# Patient Record
Sex: Female | Born: 1935
Health system: Southern US, Community
[De-identification: ages and names within clinical notes are randomized; demographics above are authoritative.]

## PROBLEM LIST (undated history)

## (undated) DIAGNOSIS — M19071 Primary osteoarthritis, right ankle and foot: Secondary | ICD-10-CM

## (undated) DIAGNOSIS — M810 Age-related osteoporosis without current pathological fracture: Secondary | ICD-10-CM

## (undated) DIAGNOSIS — R2681 Unsteadiness on feet: Secondary | ICD-10-CM

## (undated) DIAGNOSIS — R002 Palpitations: Secondary | ICD-10-CM

## (undated) DIAGNOSIS — I10 Essential (primary) hypertension: Secondary | ICD-10-CM

## (undated) DIAGNOSIS — Z87440 Personal history of urinary (tract) infections: Secondary | ICD-10-CM

## (undated) HISTORY — DX: Personal history of urinary (tract) infections: Z87.440

## (undated) HISTORY — PX: OTHER SURGICAL HISTORY: SHX169

## (undated) HISTORY — DX: Palpitations: R00.2

## (undated) HISTORY — DX: Age-related osteoporosis without current pathological fracture: M81.0

## (undated) HISTORY — DX: Unsteadiness on feet: R26.81

## (undated) HISTORY — DX: Primary osteoarthritis, right ankle and foot: M19.071

---

## 2012-04-01 DIAGNOSIS — I10 Essential (primary) hypertension: Secondary | ICD-10-CM | POA: Diagnosis not present

## 2012-04-01 DIAGNOSIS — S058X9A Other injuries of unspecified eye and orbit, initial encounter: Secondary | ICD-10-CM | POA: Diagnosis not present

## 2012-04-02 DIAGNOSIS — S058X9A Other injuries of unspecified eye and orbit, initial encounter: Secondary | ICD-10-CM | POA: Diagnosis not present

## 2013-05-09 ENCOUNTER — Ambulatory Visit (INDEPENDENT_AMBULATORY_CARE_PROVIDER_SITE_OTHER): Payer: Medicare Other | Admitting: General Practice

## 2013-05-09 ENCOUNTER — Encounter: Payer: Self-pay | Admitting: General Practice

## 2013-05-09 ENCOUNTER — Ambulatory Visit (INDEPENDENT_AMBULATORY_CARE_PROVIDER_SITE_OTHER): Payer: Medicare Other

## 2013-05-09 VITALS — BP 144/65 | HR 75 | Temp 98.2°F | Ht 61.0 in | Wt 151.5 lb

## 2013-05-09 DIAGNOSIS — M19079 Primary osteoarthritis, unspecified ankle and foot: Secondary | ICD-10-CM | POA: Diagnosis not present

## 2013-05-09 DIAGNOSIS — M79609 Pain in unspecified limb: Secondary | ICD-10-CM

## 2013-05-09 DIAGNOSIS — M79671 Pain in right foot: Secondary | ICD-10-CM

## 2013-05-09 DIAGNOSIS — R2689 Other abnormalities of gait and mobility: Secondary | ICD-10-CM

## 2013-05-09 DIAGNOSIS — M19071 Primary osteoarthritis, right ankle and foot: Secondary | ICD-10-CM

## 2013-05-09 MED ORDER — HUGO ROLLING WALKER BASIC MISC: 1.0000 [IU] | Freq: Once | Status: DC

## 2013-05-09 MED ORDER — KETOROLAC TROMETHAMINE 30 MG/ML IJ SOLN
30.0000 mg | Freq: Once | INTRAMUSCULAR | Status: AC
  Administered 2013-05-09: 30 mg via INTRAMUSCULAR

## 2013-05-09 NOTE — Progress Notes (Signed)
   Subjective:    Patient ID: [name], female    DOB: 11-08-1932, 80 y.o.   MRN: [id number]  Foot Pain This is a chronic (Right foot pain) problem. Episode onset: 19 years fractured ankle, but worsened over past three years. The problem occurs constantly. The problem has been gradually worsening. Pertinent negatives include no arthralgias, change in bowel habit, chest pain, chills, fever, headaches, myalgias, neck pain or weakness. The symptoms are aggravated by walking. She has tried acetaminophen and NSAIDs (OTC analgesic creams) for the symptoms. The treatment provided mild relief.      Review of Systems  Constitutional: Negative for fever and chills.  Respiratory: Negative for chest tightness and shortness of breath.   Cardiovascular: Negative for chest pain and palpitations.  Gastrointestinal: Negative for change in bowel habit.  Musculoskeletal: Negative for arthralgias, myalgias and neck pain.       Right foot pain  Neurological: Negative for dizziness, weakness and headaches.       Objective:   Physical Exam  Constitutional: She is oriented to person, place, and time. She appears well-developed and well-nourished.  Cardiovascular: Normal rate, regular rhythm and normal heart sounds.   Pulmonary/Chest: Effort normal and breath sounds normal. No respiratory distress. She exhibits no tenderness.  Musculoskeletal: She exhibits tenderness.  Right foot pain with ambulation  Neurological: She is alert and oriented to person, place, and time.    [facility] reading (PRIMARY) by Erby Pian, FNP-C, right foot arthritis and bone deformity.       Assessment & Plan:  1. Right foot pain  - DG Foot Complete Right; Future - ketorolac (TORADOL) 30 MG/ML injection 30 mg; Inject 1 mL (30 mg total) into the muscle once. - Ambulatory referral to Orthopedic Surgery  2. Arthritis of right foot and 3. Impaired weight bearing  - Misc. Devices (Post Falls) MISC; 1 Units  by Does not apply route once.  Dispense: 1 each; Refill: 0 -Rest, ice, and elevate affected area -RTO if symptoms worsen and prn Patient and family member verbalized understanding Erby Pian, FNP-C

## 2013-05-09 NOTE — Patient Instructions (Signed)

## 2013-06-21 DIAGNOSIS — M779 Enthesopathy, unspecified: Secondary | ICD-10-CM | POA: Diagnosis not present

## 2013-06-21 DIAGNOSIS — M19079 Primary osteoarthritis, unspecified ankle and foot: Secondary | ICD-10-CM | POA: Diagnosis not present

## 2013-06-21 DIAGNOSIS — M25579 Pain in unspecified ankle and joints of unspecified foot: Secondary | ICD-10-CM | POA: Diagnosis not present

## 2013-06-21 DIAGNOSIS — M79609 Pain in unspecified limb: Secondary | ICD-10-CM | POA: Diagnosis not present

## 2013-07-06 ENCOUNTER — Telehealth: Payer: Self-pay | Admitting: General Practice

## 2013-07-11 ENCOUNTER — Other Ambulatory Visit: Payer: Self-pay | Admitting: General Practice

## 2013-07-11 DIAGNOSIS — R2689 Other abnormalities of gait and mobility: Secondary | ICD-10-CM

## 2013-07-11 DIAGNOSIS — M19071 Primary osteoarthritis, right ankle and foot: Secondary | ICD-10-CM

## 2013-07-11 DIAGNOSIS — M79671 Pain in right foot: Secondary | ICD-10-CM

## 2013-07-11 NOTE — Telephone Encounter (Signed)
Script ready for pickup, nurse called to inform.

## 2013-07-19 DIAGNOSIS — B351 Tinea unguium: Secondary | ICD-10-CM | POA: Diagnosis not present

## 2013-07-19 DIAGNOSIS — M79609 Pain in unspecified limb: Secondary | ICD-10-CM | POA: Diagnosis not present

## 2013-09-06 ENCOUNTER — Telehealth: Payer: Self-pay | Admitting: General Practice

## 2013-09-28 ENCOUNTER — Encounter: Payer: Self-pay | Admitting: Family

## 2013-09-28 ENCOUNTER — Ambulatory Visit (INDEPENDENT_AMBULATORY_CARE_PROVIDER_SITE_OTHER): Payer: Medicare Other | Admitting: Family

## 2013-09-28 VITALS — BP 155/75 | HR 90 | Temp 98.0°F | Ht 61.0 in | Wt 156.2 lb

## 2013-09-28 DIAGNOSIS — R2681 Unsteadiness on feet: Secondary | ICD-10-CM

## 2013-09-28 DIAGNOSIS — M79609 Pain in unspecified limb: Secondary | ICD-10-CM | POA: Diagnosis not present

## 2013-09-28 DIAGNOSIS — M79671 Pain in right foot: Secondary | ICD-10-CM

## 2013-09-28 DIAGNOSIS — R2689 Other abnormalities of gait and mobility: Secondary | ICD-10-CM

## 2013-09-28 DIAGNOSIS — R269 Unspecified abnormalities of gait and mobility: Secondary | ICD-10-CM

## 2013-09-28 DIAGNOSIS — M19071 Primary osteoarthritis, right ankle and foot: Secondary | ICD-10-CM

## 2013-09-28 DIAGNOSIS — M19079 Primary osteoarthritis, unspecified ankle and foot: Secondary | ICD-10-CM

## 2013-09-28 NOTE — Progress Notes (Signed)
   Subjective:    Patient ID: [name], female    DOB: 1932-12-13, 80 y.o.   MRN: [id number]  HPI Pt presents to office with complaints of unsteady gait. Pt states she is already using a walker daily safely with no complaints, however it is "old and falling apart". Pt states without her walker she can complete her daily activities. Pt has impaired gait that inhibits her ability to participate in daily actitvies of daily living such as meal prep, grocery shopping, cleaning, and walking around her home without her walker.  *Pt spanish speaking, interrupter used during visit.  Review of Systems  HENT: Negative.   Respiratory: Negative.   Cardiovascular: Negative.   Gastrointestinal: Negative.   Genitourinary: Negative.   Musculoskeletal: Positive for arthralgias and gait problem.  Neurological: Negative.   Psychiatric/Behavioral: Negative.   All other systems reviewed and are negative.      Objective:   Physical Exam  Vitals reviewed. Constitutional: She is oriented to person, place, and time. She appears well-developed and well-nourished. No distress.  Cardiovascular: Normal rate, regular rhythm, normal heart sounds and intact distal pulses.   No murmur heard. Pulmonary/Chest: Effort normal and breath sounds normal. No respiratory distress. She has no wheezes.  Abdominal: Soft. Bowel sounds are normal. She exhibits no distension. There is no tenderness.  Musculoskeletal: Normal range of motion. She exhibits tenderness (in bilateral ankles). She exhibits no edema.  Pt presents to office using a walker, unsteady gait without using walker.  Neurological: She is alert and oriented to person, place, and time. She has normal reflexes. No cranial nerve deficit.  Skin: Skin is warm and dry.  Psychiatric: She has a normal mood and affect. Her behavior is normal. Judgment and thought content normal.     BP 155/75  Pulse 90  Temp(Src) 98 F (36.7 C) (Oral)  Ht 5\' 1"  (1.549 m)  Wt  156 lb 3.2 oz (70.852 kg)  BMI 29.53 kg/m2      Assessment & Plan:  1. Unsteady gait - For home use only DME 4 wheeled rolling walker with seat ([id number]) -Fall precaution discussed -Pt educated on places to take RX to and told to call office if any problems  2. Right foot pain  3. Arthritis of right foot  4. Impaired weight bearing  Evelina Dun, FNP

## 2013-09-28 NOTE — Patient Instructions (Addendum)
Prevencin de las cadas y seguridad en Engineer, mining  (Fall Prevention and Animal nutritionist) Las cadas causan lesiones y Lawyer a personas de todas las edades. Es posible utilizar medidas preventivas para disminuir significativamente la probabilidad de cadas. Hay medidas simples que pueden hacer de su hogar un lugar seguro y Product/process development scientist las cadas.  [place] grietas y los bordes de aceras y Chartered certified accountant.  Retire los [employer].  Recorte los arbustos en el camino principal.  Coloque una buena iluminacin en el exterior.  Elimine las herramientas, piedras y escombros de los senderos.  Compruebe que los pasamanos no se rompan y estn bien sujetos. Debe haber pasamanos a ambos lados de las escaleras .  Limpie regularmente hojas, nieve y hielo.  Utilice arena o sal en los pasillos durante los meses de invierno.  En el garaje, limpiar la grasa o los derrames de combustible. EN EL BAO   Instale luces de noche.  Instale barras de apoyo en el inodoro, en la baera y en la ducha.  Utilice alfombras o calcomanas antideslizantes en la baera o ducha.  Coloque un taburete de plstico antideslizante en la ducha para sentarse, si es necesario.  Mantenga los pisos limpios y seque toda el agua del suelo inmediatamente.  Elimine regularmente la acumulacin de jabn en la baera o la ducha.  Asegure las alfombras de bao con una cinta para alfombra doble faz.  Retire las alfombras y todo lo que pueda ser un un riesgo. HABITACIONES   Instale luces de noche.  Asegrese de que la luz de la mesita sea de fcil [place].  No utilice ropa de cama de gran tamao.  Mantenga un telfono junto a su cama.  Tenga una silla firme con apoyabrazos para usar cuando se viste.  Retire las alfombras y lo que pueda ser un riesgo de cadas. [place] ollas y sartenes hacia el centro del horno. Use los quemadores de atrs siempre que sea posible.  Limpie los  derrames rpidamente y de tiempo para el secado.  Evite caminar sobre pisos mojados.  Evite utensilios y cuchillos calientes .  Cambie de posicin los estantes para que no sean demasiado altos o bajos.  Coloque los objetos de uso comn en lugares de fcil acceso.  Si es necesario, use una escalera firme con una barra de apoyo.  Mantenga los cables elctricos fuera del camino.  No use cera para pisos o limpiadores que dejen los pisos resbaladizos. Si tiene que usar cera, utilice cera antideslizante.  Retire del piso las alfombras y todo lo que pueda ser un un riesgo. ESCALERAS   Nunca deje objetos en las escaleras.  Coloque pasamanos a ambos lados de las escaleras y selos. Arregle los pasamanos sueltos. Asegrese que los pasamanos en ambos lados de las escaleras sean tan largos como las escaleras.  Verifique que la alfombra est bien asegurada a las escaleras. Repare rpidamente las alfombras desgastadas o sueltas .  Evite colocar alfombras en la parte superior o inferior de las escaleras, o asegure correctamente la alfombra con cinta adhesiva para alfombras para evitar el deslizamiento. Deshgase de alfombras, si es posible.  Pdale a un electricista que coloque un interruptor de la luz en la parte superior e inferior de las escaleras. OTROS CONSEJOS PARA LA PREVENCIN DE CADAS   Use zapatos de tacn bajo o con suela de goma que sostengan y [place]. Use zapatos cerrados.  Al utilizar una escalera de  mano, asegrese de que est completamente abierta y los dos travesaos firmemente bloqueados. No suba a una escalera de mano estando cerrada. .  Aada pintura de contraste o cinta de colores a las barras de apoyo y Buyer, retail en su casa. Coloque las tiras de contraste de Psychiatrist y el ltimo escaln.  Conozca y use los dispositivos de ayuda para la movilidad, segn sea necesario. Instalar un sistema de respuestas de Biomedical scientist.  Prenda las luces para evitar  las reas oscuras. Reponga inmediatamente las lamparillas elctricas que se hayan quemado. Coloque interruptores de luz luminiscentes.  Disponga los muebles de tal modo que pueda crear caminos despejados. Deje los muebles siempre en Therapist, sports.  Asegure firmemente las alfombras con cinta adhesiva de doble faz.  Elimine las superficies desparejas en los pisos.  Seleccione un diseo de alfombra que visualmente no oculte los bordes de las alfombras.  Tenga cuidado con las [employer]. OTROS CONSEJOS DE SEGURIDAD PARA EL HOGAR   Seleccione una temperatura de 120 F (48,8 C) para el agua.  Tenga los nmeros de emergencia cerca del telfono.  Coloque detectores de humo en cada nivel de su casa y cerca de los dormitorios. Document Released: 07/15/2007 Document Revised: 10/07/2011 [facility] Patient Information 2014 [place], [place].

## 2013-10-06 ENCOUNTER — Telehealth: Payer: Self-pay | Admitting: Family

## 2013-10-11 DIAGNOSIS — M79609 Pain in unspecified limb: Secondary | ICD-10-CM | POA: Diagnosis not present

## 2013-10-11 DIAGNOSIS — M25579 Pain in unspecified ankle and joints of unspecified foot: Secondary | ICD-10-CM | POA: Diagnosis not present

## 2013-10-11 DIAGNOSIS — M7750 Other enthesopathy of unspecified foot: Secondary | ICD-10-CM | POA: Diagnosis not present

## 2013-10-12 NOTE — Telephone Encounter (Signed)
Faxed noted to advanced

## 2014-05-30 DIAGNOSIS — M76811 Anterior tibial syndrome, right leg: Secondary | ICD-10-CM | POA: Diagnosis not present

## 2014-05-30 DIAGNOSIS — M76812 Anterior tibial syndrome, left leg: Secondary | ICD-10-CM | POA: Diagnosis not present

## 2014-08-29 DIAGNOSIS — M76891 Other specified enthesopathies of right lower limb, excluding foot: Secondary | ICD-10-CM | POA: Diagnosis not present

## 2014-08-29 DIAGNOSIS — M76892 Other specified enthesopathies of left lower limb, excluding foot: Secondary | ICD-10-CM | POA: Diagnosis not present

## 2014-08-29 DIAGNOSIS — M79673 Pain in unspecified foot: Secondary | ICD-10-CM | POA: Diagnosis not present

## 2014-10-05 DIAGNOSIS — M791 Myalgia: Secondary | ICD-10-CM | POA: Diagnosis not present

## 2014-11-23 ENCOUNTER — Encounter: Payer: Self-pay | Admitting: Physician Assistant

## 2014-11-23 ENCOUNTER — Ambulatory Visit (INDEPENDENT_AMBULATORY_CARE_PROVIDER_SITE_OTHER): Payer: Medicare Other | Admitting: Physician Assistant

## 2014-11-23 VITALS — BP 176/84 | HR 103 | Temp 97.9°F | Ht 61.0 in | Wt 154.4 lb

## 2014-11-23 DIAGNOSIS — N3090 Cystitis, unspecified without hematuria: Secondary | ICD-10-CM

## 2014-11-23 DIAGNOSIS — R002 Palpitations: Secondary | ICD-10-CM | POA: Diagnosis not present

## 2014-11-23 DIAGNOSIS — R350 Frequency of micturition: Secondary | ICD-10-CM

## 2014-11-23 DIAGNOSIS — R42 Dizziness and giddiness: Secondary | ICD-10-CM

## 2014-11-23 DIAGNOSIS — K140 Glossitis: Secondary | ICD-10-CM

## 2014-11-23 DIAGNOSIS — R5383 Other fatigue: Secondary | ICD-10-CM | POA: Diagnosis not present

## 2014-11-23 LAB — POCT CBC
Granulocyte percent: 72.8 %G (ref 37–80)
HEMATOCRIT: 41.6 % (ref 37.7–47.9)
Hemoglobin: 13.1 g/dL (ref 12.2–16.2)
LYMPH, POC: 2 (ref 0.6–3.4)
MCH, POC: 27.5 pg (ref 27–31.2)
MCHC: 31.5 g/dL — AB (ref 31.8–35.4)
MCV: 87.5 fL (ref 80–97)
MPV: 7.2 fL (ref 0–99.8)
POC Granulocyte: 7.1 — AB (ref 2–6.9)
POC LYMPH PERCENT: 20.6 %L (ref 10–50)
Platelet Count, POC: 321 10*3/uL (ref 142–424)
RBC: 4.75 M/uL (ref 4.04–5.48)
RDW, POC: 13.5 %
WBC: 9.7 10*3/uL (ref 4.6–10.2)

## 2014-11-23 LAB — POCT UA - MICROSCOPIC ONLY
BACTERIA, U MICROSCOPIC: NEGATIVE
CRYSTALS, UR, HPF, POC: NEGATIVE
Casts, Ur, LPF, POC: NEGATIVE
MUCUS UA: NEGATIVE
YEAST UA: NEGATIVE

## 2014-11-23 LAB — POCT URINALYSIS DIPSTICK
BILIRUBIN UA: NEGATIVE
Glucose, UA: NEGATIVE
KETONES UA: NEGATIVE
NITRITE UA: NEGATIVE
PH UA: 7.5
Protein, UA: NEGATIVE
SPEC GRAV UA: 1.015
UROBILINOGEN UA: NEGATIVE

## 2014-11-23 MED ORDER — CIPROFLOXACIN HCL 500 MG PO TABS: 500.0000 mg | ORAL_TABLET | Freq: Two times a day (BID) | ORAL | Status: DC

## 2014-11-23 NOTE — Patient Instructions (Signed)
Infección urinaria  °(Urinary Tract Infection) ° La infección urinaria puede ocurrir en cualquier lugar del tracto urinario. El tracto urinario es un sistema de drenaje del cuerpo por el que se eliminan los desechos y el exceso de agua. El tracto urinario está formado por dos riñones, dos uréteres, la vejiga y la uretra. Los riñones son órganos que tienen forma de frijol. Cada riñón tiene aproximadamente el tamaño del puño. Están situados debajo de las costillas, uno a cada lado de la columna vertebral °CAUSAS  °La causa de la infección son los microbios, que son organismos microscópicos, que incluyen hongos, virus, y bacterias. Estos organismos son tan pequeños que sólo pueden verse a través del microscopio. Las bacterias son los microorganismos que más comúnmente causan infecciones urinarias.  °SÍNTOMAS  °Los síntomas pueden variar según la edad y el sexo del paciente y por la ubicación de la infección. Los síntomas en las mujeres jóvenes incluyen la necesidad frecuente e intensa de orinar y una sensación dolorosa de ardor en la vejiga o en la uretra durante la micción. Las mujeres y los hombres mayores podrán sentir cansancio, temblores y debilidad y sentir dolores musculares y dolor abdominal. Si tiene fiebre, puede significar que la infección está en los riñones. Otros síntomas son dolor en la espalda o en los lados debajo de las costillas, náuseas y vómitos.  °DIAGNÓSTICO  °Para diagnosticar una infección urinaria, el médico le preguntará acerca de sus síntomas. También le solicitará una muestra de orina. La muestra de orina se analiza para detectar bacterias y glóbulos blancos de la sangre. Los glóbulos blancos se forman en el organismo para ayudar a combatir las infecciones.  °TRATAMIENTO  °Por lo general, las infecciones urinarias pueden tratarse con medicamentos. Debido a que la mayoría de las infecciones son causadas por bacterias, por lo general pueden tratarse con antibióticos. La elección del  antibiótico y la duración del tratamiento dependerá de sus síntomas y el tipo de bacteria causante de la infección.  °INSTRUCCIONES PARA EL CUIDADO EN EL HOGAR  °· Si le recetaron antibióticos, tómelos exactamente como su médico le indique. Termine el medicamento aunque se sienta mejor después de haber tomado sólo algunos. °· Beba gran cantidad de líquido para mantener la orina de tono claro o color amarillo pálido. °· Evite la cafeína, el té y las bebidas gaseosas. Estas sustancias irritan la vejiga. °· Vaciar la vejiga con frecuencia. Evite retener la orina durante largos períodos. °· Vacíe la vejiga antes y después de tener relaciones sexuales. °· Después de mover el intestino, las mujeres deben higienizarse la región perineal desde adelante hacia atrás. Use sólo un papel tissue por vez. °SOLICITE ATENCIÓN MÉDICA SI:  °· Siente dolor en la espalda. °· Le sube la fiebre. °· Los síntomas no mejoran luego de 3 días. °SOLICITE ATENCIÓN MÉDICA DE INMEDIATO SI:  °· Siente dolor intenso en la espalda o en la zona inferior del abdomen. °· Comienza a sentir escalofríos. °· Tiene náuseas o vómitos. °· Tiene una sensación continua de quemazón o molestias al orinar. °ASEGÚRESE DE QUE:  °· Comprende estas instrucciones. °· Controlará su enfermedad. °· Solicitará ayuda de inmediato si no mejora o empeora. °Document Released: 01/15/2005 Document Revised: 12/31/2011 °ExitCare® Patient Information ©2015 [employer]. This information is not intended to replace advice given to you by your health care provider. Make sure you discuss any questions you have with your health care provider. ° °

## 2014-11-23 NOTE — Progress Notes (Signed)
   Subjective:    Patient ID: [name], female    DOB: Jun 25, 1932, 81 y.o.   MRN: [id number]  HPI 81 y/o hispanic female presents with c/o facial tongue and throat numbness. Left hand was cold, sometimes her right hand is cold also. Episode started 3 days ago. Has been intermittent. Todays episode lasted longer. She complains that her tongue feels "heavy". No similar episodes in the past.   She has been taking ibuprofen otc for arthritis in her ankle. Does not take it on a regular basis.   She is not fluent in [place] and is accompanied by her son, who translates for her.     Review of Systems  Constitutional: Positive for chills.  HENT: Positive for voice change (hoarseness ). Negative for trouble swallowing.   Eyes: Negative.        Has difficulty blinking at times   Respiratory: Negative for shortness of breath.   Cardiovascular: Positive for palpitations (x 6 days, intermittent).  Gastrointestinal: Negative for nausea, vomiting, diarrhea, constipation and blood in stool.  Endocrine: Positive for polyuria.  Genitourinary: Positive for frequency. Negative for dysuria.  Musculoskeletal: Negative.   Skin: Negative.   Neurological: Positive for dizziness (when she is "moving around", does not happen with rest ), speech difficulty (feels that she cant move her tongue like she normally does ), weakness, light-headedness, numbness ( started on left side facial numbness, right sided facial numbness today ) and headaches (left temple intermittent, no pain today ). Negative for syncope.  Psychiatric/Behavioral: Negative for confusion. The patient is nervous/anxious.        Objective:   Physical Exam  Constitutional: She is oriented to person, place, and time. She appears well-developed and well-nourished. No distress.  Presents in wheelchair  Not fluent in [place]:  Mouth/Throat: No oropharyngeal exudate.  Tongue appears to be enlarged, difficult to examine posterior pharynx  - with tongue depressor   Cardiovascular:  EKG indicated occasional PVC's    Musculoskeletal: Normal range of motion.  Neurological: She is alert and oriented to person, place, and time.  Skin: She is not diaphoretic.  Psychiatric: She has a normal mood and affect. Her behavior is normal. Judgment and thought content normal.  Vitals reviewed.         Assessment & Plan:  1. Urinary frequency  - POCT UA - Microscopic Only - POCT urinalysis dipstick - Urine culture - ciprofloxacin (CIPRO) 500 MG tablet; Take 1 tablet (500 mg total) by mouth 2 (two) times daily.  Dispense: 20 tablet; Refill: 0  2. Lightheaded  - POCT CBC - CMP14+EGFR - EKG 12-Lead - Ambulatory referral to Cardiology  3. Palpitations  - EKG 12-Lead - Ambulatory referral to Cardiology  4. Cystitis  - ciprofloxacin (CIPRO) 500 MG tablet; Take 1 tablet (500 mg total) by mouth 2 (two) times daily.  Dispense: 20 tablet; Refill: 0  5. Glossitis  - Vitamin B12   RTO 2 weeks for recheck and to discuss labs   Jakorian Marengo A. Benjamin Stain PA-C

## 2014-11-24 LAB — CMP14+EGFR
A/G RATIO: 1.4 (ref 1.1–2.5)
ALK PHOS: 87 IU/L (ref 39–117)
ALT: 13 IU/L (ref 0–32)
AST: 18 IU/L (ref 0–40)
Albumin: 4.1 g/dL (ref 3.5–4.8)
BUN/Creatinine Ratio: 18 (ref 11–26)
BUN: 11 mg/dL (ref 8–27)
Bilirubin Total: 0.6 mg/dL (ref 0.0–1.2)
CALCIUM: 9.2 mg/dL (ref 8.7–10.3)
CHLORIDE: 98 mmol/L (ref 97–108)
CO2: 25 mmol/L (ref 18–29)
Creatinine, Ser: 0.6 mg/dL (ref 0.57–1.00)
GFR calc Af Amer: 100 mL/min/{1.73_m2} (ref >59)
GFR calc non Af Amer: 87 mL/min/{1.73_m2} (ref >59)
GLOBULIN, TOTAL: 3 g/dL (ref 1.5–4.5)
Glucose: 91 mg/dL (ref 65–99)
Potassium: 4.1 mmol/L (ref 3.5–5.2)
Sodium: 138 mmol/L (ref 134–144)
Total Protein: 7.1 g/dL (ref 6.0–8.5)

## 2014-11-24 LAB — VITAMIN B12: VITAMIN B 12: 390 pg/mL (ref 211–946)

## 2014-11-25 LAB — URINE CULTURE

## 2014-12-08 ENCOUNTER — Ambulatory Visit (INDEPENDENT_AMBULATORY_CARE_PROVIDER_SITE_OTHER): Payer: Medicare Other | Admitting: Family

## 2014-12-08 ENCOUNTER — Encounter: Payer: Self-pay | Admitting: Family

## 2014-12-08 VITALS — BP 166/73 | HR 71 | Temp 97.4°F | Ht 61.0 in | Wt 152.0 lb

## 2014-12-08 DIAGNOSIS — Z87440 Personal history of urinary (tract) infections: Secondary | ICD-10-CM | POA: Diagnosis not present

## 2014-12-08 DIAGNOSIS — J309 Allergic rhinitis, unspecified: Secondary | ICD-10-CM

## 2014-12-08 LAB — POCT UA - MICROSCOPIC ONLY
BACTERIA, U MICROSCOPIC: NEGATIVE
CRYSTALS, UR, HPF, POC: NEGATIVE
RBC, urine, microscopic: NEGATIVE
Yeast, UA: NEGATIVE

## 2014-12-08 LAB — POCT URINALYSIS DIPSTICK
Bilirubin, UA: NEGATIVE
Glucose, UA: NEGATIVE
Ketones, UA: NEGATIVE
LEUKOCYTES UA: NEGATIVE
NITRITE UA: NEGATIVE
Protein, UA: NEGATIVE
Spec Grav, UA: 1.02
UROBILINOGEN UA: NEGATIVE
pH, UA: 5

## 2014-12-08 MED ORDER — CETIRIZINE HCL 10 MG PO TABS: 10.0000 mg | ORAL_TABLET | Freq: Every day | ORAL | Status: DC

## 2014-12-08 NOTE — Patient Instructions (Addendum)
Rinitis alrgica (Allergic Rhinitis) La rinitis alrgica ocurre cuando las membranas mucosas de la nariz responden a los alrgenos. Los alrgenos son las partculas que estn en el aire y que hacen que el cuerpo tenga una reaccin IT consultant. Esto hace que usted libere anticuerpos alrgicos. A travs de una cadena de eventos, estos finalmente hacen que usted libere histamina en la corriente sangunea. Aunque la funcin de la histamina es proteger al organismo, es esta liberacin de histamina lo que provoca malestar, como los estornudos frecuentes, la congestin y goteo y Engineer, petroleum.  CAUSAS  La causa de la rinitis Regulatory affairs officer (fiebre del heno) son los alrgenos del polen que pueden provenir del csped, los rboles y Human resources officer. La causa de la rinitis Stage manager (rinitis alrgica perenne) son los alrgenos como los caros del polvo domstico, la caspa de las mascotas y las esporas del moho.  SNTOMAS   Secrecin nasal (congestin).  Goteo y picazn nasales con estornudos y Industrial/product designer. DIAGNSTICO  Su mdico puede ayudarlo a Actor alrgeno o los alrgenos que desencadenan sus sntomas. Si usted y su mdico no pueden Teacher, adult education cul es el alrgeno, pueden hacerse anlisis de sangre o estudios de la piel. TRATAMIENTO  La rinitis alrgica no tiene [place], pero puede controlarse mediante lo siguiente:  Medicamentos y vacunas contra la alergia (inmunoterapia).  Prevencin del alrgeno. La fiebre del heno a menudo puede tratarse con antihistamnicos en las formas de pldoras o aerosol nasal. Los antihistamnicos bloquean los efectos de la histamina. Existen medicamentos de venta libre que pueden ayudar con la congestin nasal y la hinchazn alrededor de los ojos. Consulte a su mdico antes de tomar o administrarse este medicamento.  Si la prevencin del alrgeno o el medicamento recetado no dan resultado, existen muchos medicamentos nuevos que su mdico puede recetarle. Pueden  usarse medicamentos ms fuertes si las medidas iniciales no son efectivas. Pueden aplicarse inyecciones desensibilizantes si los medicamentos y la prevencin no funcionan. La desensibilizacin ocurre cuando un paciente recibe vacunas constantes hasta que el cuerpo se vuelve menos sensible al alrgeno. Asegrese de Chartered certified accountant seguimiento con su mdico si los problemas continan. INSTRUCCIONES PARA EL CUIDADO EN EL HOGAR No es posible evitar por completo los alrgenos, pero puede reducir los sntomas al tomar medidas para limitar su exposicin a ellos. Es muy til saber exactamente a qu es alrgico para que pueda evitar sus desencadenantes especficos. SOLICITE ATENCIN MDICA SI:   [name].  Desarrolla una tos que no se detiene fcilmente (persistente).  Le falta el aire.  Comienza a tener sibilancias.  Los sntomas interfieren con las actividades diarias normales. Document Released: 01/15/2005 Document Revised: 01/26/2013 [facility] Patient Information 2015 [place]. This information is not intended to replace advice given to you by your health care provider. Make sure you discuss any questions you have with your health care provider.

## 2014-12-08 NOTE — Progress Notes (Signed)
   Subjective:    Patient ID: [name], female    DOB: [birth year], 81 y.o.   MRN: [id number]   HPI Pt presents to the office today to follow up on a UTI and dizziness. Pt reports the dysuria has improved greatly after completing her antibiotic.  Today pt is complaining of a "dryness" and "raspey voice". Pt denies any SOB, cough, fever, or chest pains.   Review of Systems  Constitutional: Negative.   HENT: Negative.   Eyes: Negative.   Respiratory: Negative.  Negative for shortness of breath.   Cardiovascular: Negative.  Negative for palpitations.  Gastrointestinal: Negative.   Endocrine: Negative.   Genitourinary: Negative.   Musculoskeletal: Negative.   Neurological: Negative.  Negative for headaches.  Hematological: Negative.   Psychiatric/Behavioral: Negative.   All other systems reviewed and are negative.      Objective:   Physical Exam  Constitutional: She is oriented to person, place, and time. She appears well-developed and well-nourished. No distress.  HENT:  Head: Normocephalic and atraumatic.  Right Ear: External ear normal.  Left Ear: External ear normal.  Oropharynx slightly erythemas Nasal passage mildly erythemas with mild swelling    Eyes: Pupils are equal, round, and reactive to light.  Neck: Normal range of motion. Neck supple. No thyromegaly present.  Cardiovascular: Normal rate, regular rhythm, normal heart sounds and intact distal pulses.   No murmur heard. Pulmonary/Chest: Effort normal and breath sounds normal. No respiratory distress. She has no wheezes.  Abdominal: Soft. Bowel sounds are normal. She exhibits no distension. There is no tenderness.  Musculoskeletal: Normal range of motion. She exhibits no edema or tenderness.  Neurological: She is alert and oriented to person, place, and time. She has normal reflexes. No cranial nerve deficit.  Skin: Skin is warm and dry.  Psychiatric: She has a normal mood and affect. Her behavior is normal.  Judgment and thought content normal.  Vitals reviewed.   BP 166/73 mmHg  Pulse 71  Temp(Src) 97.4 F (36.3 C) (Oral)  Ht 5\' 1"  (1.549 m)  Wt 152 lb (68.947 kg)  BMI 28.74 kg/m2       Assessment & Plan:  1. History of UTI - POCT urinalysis dipstick - POCT UA - Microscopic Only  2. Allergic rhinitis, unspecified allergic rhinitis type -Avoid allergens when possible -Encourage deep breathing and coughing as needed -RTO as needed and pt needs chronic follow up for blood work - cetirizine (ZYRTEC) 10 MG tablet; Take 1 tablet (10 mg total) by mouth daily.  Dispense: 30 tablet; Refill: Desha, FNP

## 2014-12-22 ENCOUNTER — Ambulatory Visit (INDEPENDENT_AMBULATORY_CARE_PROVIDER_SITE_OTHER): Payer: Medicare Other | Admitting: Family Medicine

## 2014-12-22 ENCOUNTER — Encounter: Payer: Self-pay | Admitting: Family Medicine

## 2014-12-22 VITALS — BP 158/76 | HR 75 | Temp 97.3°F | Ht 61.0 in | Wt 155.0 lb

## 2014-12-22 DIAGNOSIS — M25561 Pain in right knee: Secondary | ICD-10-CM

## 2014-12-22 DIAGNOSIS — J040 Acute laryngitis: Secondary | ICD-10-CM | POA: Diagnosis not present

## 2014-12-22 MED ORDER — PREDNISONE 10 MG PO TABS: ORAL_TABLET | ORAL | Status: DC

## 2014-12-22 NOTE — Progress Notes (Signed)
Subjective:  Patient ID: [name], female    DOB: May 04, 1932  Age: 81 y.o. MRN: [id number]  CC: Hoarse   HPI [name] presents for dry, sore throat. Hoarse. Onset 1 month ago.  Took zyrtec for 1 week. Didn't help. No cough. Lungs feel tired. Feels a wheezing in the night in the throat. Nose is dry.   Right leg wound 4 yrs ago. Now painful. Pain is primarily located at the level of the right knee. It has been painful off and on ever since the injury. Usually controlled with Voltaren gel and ibuprofen but not successfully recently. Pain seems to have flared over the last few weeks. No change in activity. No known reinjury.   [name] has no past medical history on file.   She has no past surgical history on file.   Her family history is not on file.She reports that she has never smoked. She does not have any smokeless tobacco history on file. She reports that she does not drink alcohol or use illicit drugs.  Outpatient Prescriptions Prior to Visit  Medication Sig Dispense Refill  . Ibuprofen (ADVIL) 200 MG CAPS Take by mouth.    . VOLTAREN 1 % GEL     . cetirizine (ZYRTEC) 10 MG tablet Take 1 tablet (10 mg total) by mouth daily. (Patient not taking: Reported on 12/22/2014) 30 tablet 11   No facility-administered medications prior to visit.    ROS Review of Systems  Constitutional: Negative for fever, chills, diaphoresis, appetite change, fatigue and unexpected weight change.  HENT: Positive for congestion, postnasal drip and sore throat. Negative for ear pain, rhinorrhea, sneezing and trouble swallowing.   Eyes: Negative for pain.  Respiratory: Negative for cough, chest tightness and shortness of breath.   Cardiovascular: Negative for chest pain and palpitations.  Gastrointestinal: Negative for nausea, vomiting, abdominal pain, diarrhea and constipation.  Musculoskeletal: Positive for arthralgias (see history of present illness). Negative for joint swelling.  Skin: Negative  for rash.  Neurological: Negative for dizziness, weakness, numbness and headaches.  Psychiatric/Behavioral: Negative.     Objective:  BP 158/76 mmHg  Pulse 75  Temp(Src) 97.3 F (36.3 C) (Oral)  Ht 5\' 1"  (1.549 m)  Wt 155 lb (70.308 kg)  BMI 29.30 kg/m2  BP Readings from Last 3 Encounters:  12/22/14 158/76  12/08/14 166/73  11/23/14 176/84    Wt Readings from Last 3 Encounters:  12/22/14 155 lb (70.308 kg)  12/08/14 152 lb (68.947 kg)  11/23/14 154 lb 6.4 oz (70.035 kg)     Physical Exam  Constitutional: She is oriented to person, place, and time. She appears well-developed and well-nourished. No distress.  HENT:  Head: Normocephalic and atraumatic.  Right Ear: External ear normal.  Left Ear: External ear normal.  Nose: Nose normal.  Mouth/Throat: Oropharynx is clear and moist.  Eyes: Conjunctivae and EOM are normal. Pupils are equal, round, and reactive to light.  Neck: Normal range of motion. Neck supple. No JVD present. No tracheal deviation present. No thyromegaly present.  Cardiovascular: Normal rate, regular rhythm and normal heart sounds.   No murmur heard. Pulmonary/Chest: Effort normal and breath sounds normal. No stridor. No respiratory distress. She has no wheezes. She has no rales.  Abdominal: Soft. Bowel sounds are normal. She exhibits no distension. There is no tenderness.  Lymphadenopathy:    She has no cervical adenopathy.  Neurological: She is alert and oriented to person, place, and time. She has normal reflexes.  Skin: Skin is warm  and dry.  Psychiatric: She has a normal mood and affect. Her behavior is normal. Judgment and thought content normal.    No results found for: HGBA1C  Lab Results  Component Value Date   WBC 9.7 11/23/2014   HGB 13.1 11/23/2014   HCT 41.6 11/23/2014   GLUCOSE 91 11/23/2014   ALT 13 11/23/2014   AST 18 11/23/2014   NA 138 11/23/2014   K 4.1 11/23/2014   CL 98 11/23/2014   CREATININE 0.60 11/23/2014   BUN 11  11/23/2014   CO2 25 11/23/2014    Patient was never admitted.  Assessment & Plan:   [name] was seen today for hoarse.  Diagnoses and all orders for this visit:  Laryngitis  Pain in joint, lower leg, right  Other orders -     predniSONE (DELTASONE) 10 MG tablet; Take 5 daily for 3 days followed by 4,3,2 and 1 for 3 days each.   I have discontinued [name]'s cetirizine. I am also having her start on predniSONE. Additionally, I am having her maintain her Ibuprofen and VOLTAREN.  Meds ordered this encounter  Medications  . predniSONE (DELTASONE) 10 MG tablet    Sig: Take 5 daily for 3 days followed by 4,3,2 and 1 for 3 days each.    Dispense:  45 tablet    Refill:  0     Follow-up: Return in about 2 weeks (around 01/05/2015).  Claretta Fraise, M.D.

## 2014-12-22 NOTE — Patient Instructions (Signed)
Calcium 1200 mg daily Vitamin D 2000 units daily

## 2014-12-26 DIAGNOSIS — R0982 Postnasal drip: Secondary | ICD-10-CM | POA: Insufficient documentation

## 2014-12-26 DIAGNOSIS — M79671 Pain in right foot: Secondary | ICD-10-CM | POA: Insufficient documentation

## 2014-12-26 DIAGNOSIS — J040 Acute laryngitis: Secondary | ICD-10-CM | POA: Insufficient documentation

## 2014-12-26 DIAGNOSIS — M19071 Primary osteoarthritis, right ankle and foot: Secondary | ICD-10-CM | POA: Insufficient documentation

## 2014-12-26 DIAGNOSIS — R2681 Unsteadiness on feet: Secondary | ICD-10-CM | POA: Insufficient documentation

## 2014-12-26 DIAGNOSIS — R0981 Nasal congestion: Secondary | ICD-10-CM | POA: Insufficient documentation

## 2014-12-26 DIAGNOSIS — Z87440 Personal history of urinary (tract) infections: Secondary | ICD-10-CM | POA: Insufficient documentation

## 2014-12-26 DIAGNOSIS — J309 Allergic rhinitis, unspecified: Secondary | ICD-10-CM | POA: Insufficient documentation

## 2014-12-26 DIAGNOSIS — J029 Acute pharyngitis, unspecified: Secondary | ICD-10-CM | POA: Insufficient documentation

## 2014-12-26 DIAGNOSIS — R2689 Other abnormalities of gait and mobility: Secondary | ICD-10-CM | POA: Insufficient documentation

## 2014-12-27 ENCOUNTER — Other Ambulatory Visit (INDEPENDENT_AMBULATORY_CARE_PROVIDER_SITE_OTHER): Payer: Medicare Other

## 2014-12-27 ENCOUNTER — Ambulatory Visit (INDEPENDENT_AMBULATORY_CARE_PROVIDER_SITE_OTHER): Payer: Medicare Other | Admitting: Cardiology

## 2014-12-27 ENCOUNTER — Other Ambulatory Visit: Payer: Self-pay | Admitting: Cardiology

## 2014-12-27 ENCOUNTER — Encounter: Payer: Self-pay | Admitting: Cardiology

## 2014-12-27 VITALS — BP 180/88 | HR 62 | Ht 61.0 in | Wt 155.0 lb

## 2014-12-27 DIAGNOSIS — R002 Palpitations: Secondary | ICD-10-CM

## 2014-12-27 DIAGNOSIS — R0602 Shortness of breath: Secondary | ICD-10-CM

## 2014-12-27 NOTE — Progress Notes (Signed)
Cardiology Office Note   Date:  12/27/2014   ID:  [name], DOB 01/11/1933, MRN [id number]  PCP:  Sharion Balloon, FNP  Cardiologist:   Minus Breeding, MD   No chief complaint on file.     History of Present Illness: [name] is a 81 y.o. female who presents for New patient evaluation. She has no prior cardiac history. She presents for evaluation of palpitations. She's been noticing this for several weeks or months. She will notice her heart skipping. She does get lightheaded and actually has had to eaze herself down to the bed at times when this happens. It does not sound like she lost consciousness. It seems to be getting more intense with time. She says she can bring this on when she tries to do household activities though she is very limited by arthritis in her foot. She does not describe any chest pressure, neck or arm discomfort. She does however get short of breath with mild exertion. She also occasionally get short of breath when lying down at night. She's not describing classic PND or orthopnea however.   Past Medical History  Diagnosis Date  . Unsteady gait   . Arthritis of right foot   . Palpitations   . Hx: UTI (urinary tract infection)     Past Surgical History  Procedure Laterality Date  . None       Current Outpatient Prescriptions  Medication Sig Dispense Refill  . Ibuprofen (ADVIL) 200 MG CAPS Take by mouth as needed.     . predniSONE (DELTASONE) 10 MG tablet Take 5 daily for 3 days followed by 4,3,2 and 1 for 3 days each. 45 tablet 0  . VOLTAREN 1 % GEL as needed.      No current facility-administered medications for this visit.    Allergies:   Review of patient's allergies indicates no known allergies.    Social History:  The patient  reports that she has never smoked. She does not have any smokeless tobacco history on file. She reports that she does not drink alcohol or use illicit drugs.   Family History:  The patient's family history  includes Heart attack (age of onset: 49) in her mother.    ROS:  Please see the history of present illness.   Otherwise, review of systems are positive for none.   All other systems are reviewed and negative.    PHYSICAL EXAM: VS:  BP 180/88 mmHg  Pulse 62  Ht 5\' 1"  (1.549 m)  Wt 155 lb (70.308 kg)  BMI 29.30 kg/m2  SpO2 94% , BMI Body mass index is 29.3 kg/(m^2). GENERAL:  Well appearing HEENT:  Pupils equal round and reactive, fundi not visualized, oral mucosa unremarkable NECK:  No jugular venous distention, waveform within normal limits, carotid upstroke brisk and symmetric, no bruits, no thyromegaly LYMPHATICS:  No cervical, inguinal adenopathy LUNGS:  Clear to auscultation bilaterally BACK:  No CVA tenderness CHEST:  Unremarkable HEART:  PMI not displaced or sustained,S1 and S2 within normal limits, no S3, no S4, no clicks, no rubs, no murmurs ABD:  Flat, positive bowel sounds normal in frequency in pitch, no bruits, no rebound, no guarding, no midline pulsatile mass, no hepatomegaly, no splenomegaly EXT:  2 plus pulses throughout, no edema, no cyanosis no clubbing SKIN:  No rashes no nodules NEURO:  Cranial nerves II through XII grossly intact, motor grossly intact throughout PSYCH:  Cognitively intact, oriented to person place and time    EKG:  EKG is not ordered today. The ekg ordered 8/4 demonstrates normal sinus rhythm, axis within normal limits, intervals within normal limits, no acute ST-T wave changes.   Recent Labs: 11/23/2014: ALT 13; BUN 11; Creatinine, Ser 0.60; Hemoglobin 13.1; Potassium 4.1; Sodium 138    Lipid Panel No results found for: CHOL, TRIG, HDL, CHOLHDL, VLDL, LDLCALC, LDLDIRECT    Wt Readings from Last 3 Encounters:  12/27/14 155 lb (70.308 kg)  12/22/14 155 lb (70.308 kg)  12/08/14 152 lb (68.947 kg)      Other studies Reviewed: Additional studies/ records that were reviewed today include: EKG. Review of the above records demonstrates:   Please see elsewhere in the note.     ASSESSMENT AND PLAN:  PALPITATIONS:  The patient describes palpitations as above. I will start with an event monitor.  Shaquitta Burbridge will need a 21 day event monitor.  The patients symptoms necessitate an event monitor.  The symptoms are too infrequent to be identified on a Holter monitor.    I will check a TSH.    DYSPNEA:  I will check an echo given this and the palpitations.    HTN:  Her blood pressure is elevated.  I will further manage this in the context of treating her palpitations after I have the data above.   Current medicines are reviewed at length with the patient today.  The patient does not have concerns regarding medicines.  The following changes have been made:  no change  Labs/ tests ordered today include:   Orders Placed This Encounter  Procedures  . Cardiac event monitor  . Echocardiogram     Disposition:   FU with me in six weeks.      Signed, Minus Breeding, MD  12/27/2014 4:08 PM    [facility]

## 2014-12-27 NOTE — Progress Notes (Signed)
Lab work for Dr Jeneen Rinks Hochrein TSH R00.2

## 2014-12-27 NOTE — Patient Instructions (Signed)
Medication Instructions:  The current medical regimen is effective;  continue present plan and medications.  Testing/Procedures: Please have blood work drawn (TSH)  Your physician has requested that you have an echocardiogram. Echocardiography is a painless test that uses sound waves to create images of your heart. It provides your doctor with information about the size and shape of your heart and how well your heart's chambers and valves are working. This procedure takes approximately one hour. There are no restrictions for this procedure.  Your physician has recommended that you wear an event monitor. Event monitors are medical devices that record the heart's electrical activity. Doctors most often [place] these monitors to diagnose arrhythmias. Arrhythmias are problems with the speed or rhythm of the heartbeat. The monitor is a small, portable device. You can wear one while you do your normal daily activities. This is usually used to diagnose what is causing palpitations/syncope (passing out).  Follow-Up: Follow up in 6 weeks after testing, with Dr Percival Spanish in [place].  Thank you for choosing [facility]!!

## 2014-12-28 ENCOUNTER — Other Ambulatory Visit: Payer: Self-pay

## 2014-12-28 ENCOUNTER — Ambulatory Visit (INDEPENDENT_AMBULATORY_CARE_PROVIDER_SITE_OTHER): Payer: Medicare Other

## 2014-12-28 DIAGNOSIS — R0602 Shortness of breath: Secondary | ICD-10-CM

## 2014-12-28 DIAGNOSIS — R002 Palpitations: Secondary | ICD-10-CM | POA: Diagnosis not present

## 2014-12-28 LAB — TSH: TSH: 3.55 u[IU]/mL (ref 0.450–4.500)

## 2015-01-01 ENCOUNTER — Telehealth: Payer: Self-pay | Admitting: Cardiology

## 2015-01-01 ENCOUNTER — Telehealth: Payer: Self-pay | Admitting: Family

## 2015-01-01 NOTE — Telephone Encounter (Signed)
Reviewed results with pt's son who stated understanding.  Pt doesn't speak English.

## 2015-01-01 NOTE — Telephone Encounter (Signed)
Left message labs were normal

## 2015-01-01 NOTE — Telephone Encounter (Signed)
New problem   Pt's daughter returning your call from Friday.

## 2015-01-05 ENCOUNTER — Ambulatory Visit (INDEPENDENT_AMBULATORY_CARE_PROVIDER_SITE_OTHER): Payer: Medicare Other | Admitting: Family Medicine

## 2015-01-05 ENCOUNTER — Encounter: Payer: Self-pay | Admitting: Family Medicine

## 2015-01-05 VITALS — BP 151/82 | HR 75 | Temp 97.9°F | Ht 61.0 in | Wt 149.6 lb

## 2015-01-05 DIAGNOSIS — Z0000 Encounter for general adult medical examination without abnormal findings: Secondary | ICD-10-CM | POA: Diagnosis not present

## 2015-01-05 DIAGNOSIS — F32A Depression, unspecified: Secondary | ICD-10-CM

## 2015-01-05 DIAGNOSIS — G4700 Insomnia, unspecified: Secondary | ICD-10-CM

## 2015-01-05 DIAGNOSIS — F329 Major depressive disorder, single episode, unspecified: Secondary | ICD-10-CM

## 2015-01-05 MED ORDER — TRAZODONE HCL 50 MG PO TABS: 25.0000 mg | ORAL_TABLET | Freq: Every evening | ORAL | Status: DC | PRN

## 2015-01-05 NOTE — Progress Notes (Signed)
Subjective:   [name] is a 81 y.o. female who presents for Medicare Annual (Subsequent) preventive examination.  Review of Systems:  Review of Systems  Constitutional: Negative for fever, chills and weight loss.  HENT: Negative for congestion, ear discharge and ear pain.   Eyes: Negative for photophobia and pain.  Respiratory: Negative for cough, hemoptysis, shortness of breath and wheezing.   Cardiovascular: Negative for chest pain, palpitations and leg swelling.  Gastrointestinal: Negative for heartburn, abdominal pain, diarrhea and constipation.  Genitourinary: Negative for dysuria and flank pain.  Musculoskeletal: Negative for myalgias and neck pain.  Skin: Negative for itching and rash.  Neurological: Positive for dizziness (one episode). Negative for tingling, tremors, focal weakness, loss of consciousness, weakness and headaches.  Psychiatric/Behavioral: Positive for depression. Negative for suicidal ideas. The patient has insomnia. The patient is not nervous/anxious.      Cardiac Risk Factors include: advanced age (>93men, >102 women)     Objective:     Vitals: BP 151/82 mmHg  Pulse 75  Temp(Src) 97.9 F (36.6 C)  Ht 5\' 1"  (1.549 m)  Wt 149 lb 9.6 oz (67.858 kg)  BMI 28.28 kg/m2  Tobacco History  Smoking status  . Never Smoker   Smokeless tobacco  . Not on file     Counseling given: Not Answered   Past Medical History  Diagnosis Date  . Unsteady gait   . Arthritis of right foot   . Palpitations   . Hx: UTI (urinary tract infection)    Past Surgical History  Procedure Laterality Date  . None     Family History  Problem Relation Age of Onset  . Heart attack Mother 23    Died in [place]   History  Sexual Activity  . Sexual Activity: Not on file    Outpatient Encounter Prescriptions as of 01/05/2015  Medication Sig  . Ibuprofen (ADVIL) 200 MG CAPS Take by mouth as needed.   . predniSONE (DELTASONE) 10 MG tablet Take 5 daily for 3 days  followed by 4,3,2 and 1 for 3 days each. (Patient not taking: Reported on 01/05/2015)  . traZODone (DESYREL) 50 MG tablet Take 0.5 tablets (25 mg total) by mouth at bedtime as needed for sleep. toma una mitad de una pastilla por la noche  . VOLTAREN 1 % GEL as needed.    No facility-administered encounter medications on file as of 01/05/2015.    Activities of Daily Living In your present state of health, do you have any difficulty performing the following activities: 01/05/2015  Hearing? N  Vision? N  Difficulty concentrating or making decisions? N  Walking or climbing stairs? Y  Dressing or bathing? N  Doing errands, shopping? N  Preparing Food and eating ? N  Using the Toilet? N  In the past six months, have you accidently leaked urine? N  Do you have problems with loss of bowel control? N  Managing your Medications? N  Managing your Finances? N  Housekeeping or managing your Housekeeping? N    Patient Care Team: Sharion Balloon, FNP as PCP - General (Nurse Practitioner)    Assessment:    Problem List Items Addressed This Visit    None    Visit Diagnoses    Routine history and physical examination of adult    -  Primary    Insomnia        Acute depression         her brother hs ben sick recently and she  has been having trroubles ause she can't go and visit. also difficulty sleeping.    Relevant Medications    traZODone (DESYREL) 50 MG tablet       Exercise Activities and Dietary recommendations Current Exercise Habits:: Home exercise routine, Type of exercise: stretching, Time (Minutes): 20, Frequency (Times/Week): 5, Weekly Exercise (Minutes/Week): 100, Intensity: Mild  Goals    None     Fall Risk Fall Risk  01/05/2015 12/08/2014 09/28/2013  Falls in the past year? Yes No No  Number falls in past yr: 1 - -  Injury with Fall? Yes - -   Depression Screen PHQ 2/9 Scores 01/05/2015 12/08/2014 09/28/2013  PHQ - 2 Score 0 0 0     Cognitive Testing MMSE - Mini Mental  State Exam 01/05/2015  Orientation to time 5  Orientation to Place 5  Registration 2  Attention/ Calculation 5  Recall 3  Language- name 2 objects 2  Language- repeat 1  Language- follow 3 step command 3  Language- read & follow direction 1  Write a sentence 1  Copy design 1  Total score 29     There is no immunization history on file for this patient. Screening Tests Health Maintenance  Topic Date Due  . TETANUS/TDAP  04/03/1954  . COLONOSCOPY  04/03/1985  . ZOSTAVAX  04/04/1995  . DEXA SCAN  04/03/2000  . PNA vac Low Risk Adult (1 of 2 - PCV13) 04/03/2000  . INFLUENZA VACCINE  11/20/2014      Plan:     see assessment  During the course of the visit the patient was educated and counseled about the following appropriate screening and preventive services:   Vaccines to include Pneumoccal, Influenza, Hepatitis B, Td, Zostavax, HCV  Electrocardiogram  Cardiovascular Disease  Colorectal cancer screening  Bone density screening  Diabetes screening  Glaucoma screening  Mammography/PAP  Nutrition counseling   Patient Instructions (the written plan) was given to the patient.    Patient will return influenza vaccination  Worthy Rancher, MD  01/05/2015

## 2015-02-07 ENCOUNTER — Ambulatory Visit: Payer: Medicare Other | Admitting: Cardiology

## 2015-02-19 ENCOUNTER — Encounter: Payer: Self-pay | Admitting: Family Medicine

## 2015-02-19 ENCOUNTER — Ambulatory Visit: Payer: Medicare Other

## 2015-02-19 ENCOUNTER — Ambulatory Visit (INDEPENDENT_AMBULATORY_CARE_PROVIDER_SITE_OTHER): Payer: Medicare Other | Admitting: Family Medicine

## 2015-02-19 VITALS — BP 148/76 | HR 83 | Temp 97.6°F | Ht 61.0 in | Wt 152.2 lb

## 2015-02-19 DIAGNOSIS — J029 Acute pharyngitis, unspecified: Secondary | ICD-10-CM | POA: Diagnosis not present

## 2015-02-19 DIAGNOSIS — Z8673 Personal history of transient ischemic attack (TIA), and cerebral infarction without residual deficits: Secondary | ICD-10-CM | POA: Insufficient documentation

## 2015-02-19 DIAGNOSIS — R471 Dysarthria and anarthria: Secondary | ICD-10-CM | POA: Diagnosis not present

## 2015-02-19 DIAGNOSIS — G458 Other transient cerebral ischemic attacks and related syndromes: Secondary | ICD-10-CM | POA: Diagnosis not present

## 2015-02-19 DIAGNOSIS — I10 Essential (primary) hypertension: Secondary | ICD-10-CM | POA: Insufficient documentation

## 2015-02-19 LAB — POCT RAPID STREP A (OFFICE): Rapid Strep A Screen: NEGATIVE

## 2015-02-19 MED ORDER — LISINOPRIL 20 MG PO TABS: 20.0000 mg | ORAL_TABLET | Freq: Every day | ORAL | Status: DC

## 2015-02-19 MED ORDER — VOLTAREN 1 % TD GEL: TRANSDERMAL | Status: DC

## 2015-02-19 MED ORDER — FLUTICASONE PROPIONATE 50 MCG/ACT NA SUSP: 2.0000 | Freq: Every day | NASAL | Status: DC

## 2015-02-19 NOTE — Progress Notes (Signed)
HPI  Conducted with help of her son, offered translator phone line or video interpreter which they turn down  Patient presents today for tongue numbness  She explains episodes of L tongue numbness, dysarthria, dizzyiess, vision blackness, and difficulty breathing that lasts 10-15 minutes.  In the last 10 days this has occurred 3 times. It improves in 10 - 15 minutes and then resolves throughout afternoon.  No symptoms currently, last episode 2 days ago.   She also complains of post nasal drip and frequnt throat clearing over the last 1 week She doesn't feel ill and has no fever She lives with her son who is present.   She has high BP but hasn't been treated, She is a non smoker She is not taking any meds She needs a refill of voltaren gel  PMH: Smoking status noted ROS: Per HPI  Objective: BP 148/76 mmHg  Pulse 83  Temp(Src) 97.6 F (36.4 C) (Oral)  Ht 5' 1"  (1.549 m)  Wt 152 lb 3.2 oz (69.037 kg)  BMI 28.77 kg/m2 Gen: NAD, alert, cooperative with exam HEENT: NCAT, EOMI, PERRL, Tongue normal, uvula midline, TMs WNL BL, nares with some slight nasal swelling CV: RRR, good S1/S2, no murmur Resp: CTABL, no wheezes, non-labored Ext: No edema, warm Neuro: Alert and oriented, walking with a walker, Strength 5/5 and sensation intact in all 4 extremities, CN 2-12 intact, normal speech, Slight rightward tongue deviation  Assessment and plan:  # Dysarthria, likely TIA Discussed signs and symptoms in detail, i feel they understand well to seek emergency care if this happens again.  MRI/MRA brain, discussed with scheduling- likely tomorrow.  Daily 81 mg asa Lipid panel  # HTN New Dx Labs, repeat in  Month Lisinopril 20 mg, last renal function very good  # Post nasal drip Flonase   Orders Placed This Encounter  Procedures  . Culture, Group A Strep  . MR MRA Head/Brain Wo Cm    Standing Status: Future     Number of Occurrences:      Standing Expiration Date: 04/20/2016     Order Specific Question:  Reason for Exam (SYMPTOM  OR DIAGNOSIS REQUIRED)    Answer:  Dysarthria suspicious for TIA    Order Specific Question:  Preferred imaging location?    Answer:  [facility]    Order Specific Question:  Does the patient have a pacemaker or implanted devices?    Answer:  No    Order Specific Question:  What is the patient's sedation requirement?    Answer:  No Sedation  . MR Brain Wo Contrast    Standing Status: Future     Number of Occurrences:      Standing Expiration Date: 04/20/2016    Order Specific Question:  Reason for Exam (SYMPTOM  OR DIAGNOSIS REQUIRED)    Answer:  Dysarthria suspicious for TIA    Order Specific Question:  Preferred imaging location?    Answer:  [facility]    Order Specific Question:  Does the patient have a pacemaker or implanted devices?    Answer:  No    Order Specific Question:  What is the patient's sedation requirement?    Answer:  No Sedation  . Lipid Panel  . CMP14+EGFR  . CBC  . Ambulatory referral to Neurology    Referral Priority:  Routine    Referral Type:  Consultation    Referral Reason:  Specialty Services Required    Requested Specialty:  Neurology  Number of Visits Requested:  1  . POCT rapid strep A    Meds ordered this encounter  Medications  . VOLTAREN 1 % GEL    Sig: Apply to R ankle 3 to 4 times daily as needed for pain    Dispense:  100 g    Refill:  2  . lisinopril (PRINIVIL,ZESTRIL) 20 MG tablet    Sig: Take 1 tablet (20 mg total) by mouth daily.    Dispense:  30 tablet    Refill:  [place], MD [facility] 02/19/2015, 6:42 PM

## 2015-02-19 NOTE — Patient Instructions (Addendum)
Great to meet you guys!  We need repeat labs in 1 month, make a follow up appointment.   Signs of a stroke include   face drooping Arm or leg weakness Slurred speech Call 911 right away if these occur   [place] isqumico transitorio (Transient Ischemic Attack) Un ataque isqumico transitorio (AIT) es un "ictus de advertencia" que causa sntomas similares al ictus. El AIT no causa un dao duradero en el cerebro. Los sntomas pueden aparecer rpido y no duran Management consultant. Es Photographer los sntomas de un AIT y Tax inspector. De esta [place], se puede prevenir un ictus o la muerte.  [place] los medicamentos solamente como se lo haya indicado el mdico. Asegrese de comprender todas las indicaciones.  Puede que tenga que tomar aspirina o un medicamento llamado warfarina. Es necesario que tome la warfarina exactamente como se lo indicaron.  Tomar demasiada o muy poca warfarina es peligroso. Debe hacerse anlisis de sangre con la frecuencia indicada por el mdico. Un anlisis de sangre TP mide el tiempo que tarda la sangre en coagularse. El TP se [place] para calcular otro valor llamado INR. Los resultados de esos anlisis ayudan a que el mdico ajuste la dosis de warfarina y se asegure de que est tomando la cantidad Advice worker.  La alimentacin puede causar problemas con la warfarina y [employer] de los anlisis de [place]. Esto es vlido para los alimentos con alto contenido de vitaminaK. Coma la misma cantidad de alimentos con alto contenido de [employer]. Los alimentos con alto contenido de vitamina K incluyen espinaca, col rizada, brcoli, repollo, acelga y nabos verdes, repollitos de [place], guisantes, coliflor, algas y perejil. Otros alimentos ricos en vitaminaK incluyen hgado de cerdo y de [place], t verde y aceite de soja. Coma la misma cantidad de alimentos con alto contenido de [employer]. Evite hacer cambios importantes en  la dieta. Informe a su mdico antes de cambiar su dieta. Hable con un especialista en alimentacin (nutricionista) si tiene preguntas.  Muchos medicamentos pueden causar problemas con la warfarina y [employer] de los anlisis de INR y PT. Informe a su mdico [employer] toma. Esto incluye vitaminas y suplementos nutricionales. No tome ni deje de tomar ningn medicamento recetado o de venta libre, excepto si el mdico se lo indica.  La warfarina puede causar ms hematomas o sangrado. Ejerza presin sobre cualquier herida por ms tiempo que lo habitual. Hable con su mdico sobre otros efectos secundarios de la warfarina.  Evite realizar actividades deportivas que puedan causar lesiones o sangrado.  Sea cuidadoso al [employer], Risk manager hilo dental o manipular objetos filosos.  Evite el alcohol o beba muy poco mientras toma warfarina. Informe a su mdico si cambia la cantidad de alcohol que bebe.  Informe a su dentista y a otros mdicos que toma warfarina antes de someterse a cualquier procedimiento.  Siga su programa de alimentacin, segn lo indicado, si tiene uno.  Mantenga un peso saludable.  Mantngase activo. Realice al menos [id number] de [place] todos o [employer].  No consuma ningn producto que contenga tabaco, lo que incluye cigarrillos, tabaco de Higher education careers adviser o Psychologist, sport and exercise. Si necesita ayuda para dejar de fumar, consulte al mdico.  Limite el consumo de alcohol a no ms de 1 medida por da si es mujer y no est Music therapist, y 2 medidas si es hombres. Una medida equivale a 12onzas de Chartered certified accountant, Public house manager de  vino o 1onzas de bebidas alcohlicas de alta graduacin.  No consuma drogas.  Haga de su hogar un lugar seguro para evitar cadas. Puede hacer lo siguiente:  Colocar barras para sostn en la habitacin y el bao.  Elevar el inodoro.  Colocar un asiento en la ducha.  Concurra a todas las visitas de control como se lo haya  indicado el mdico. Esto es importante. SOLICITE AYUDA SI:  Sufre cambios de personalidad.  Presenta dificultad para tragar.  Tiene visin doble.  Tiene mareos.  Tiene fiebre. SOLICITE AYUDA DE INMEDIATO SI:  Estos sntomas pueden indicar una emergencia. No espere hasta que los sntomas desaparezcan. Solicite atencin mdica de inmediato. Comunquese con el servicio de emergencias de su localidad (911 en los [place]). No conduzca por sus propios medios [employer].  Siente debilidad repentina o pierde la sensibilidad (tiene adormecimiento), especialmente en un lado del cuerpo. Esto puede afectar lo siguiente:  El rostro.  El brazo.  La pierna.  Tiene dificultad repentina para caminar.  Tiene dificultad repentina para mover los brazos o las piernas.  Se siente repentinamente confundido.  Presenta dificultad para hablar.  Tiene dificultad para comprender.  Presenta, sbitamente, dificultad para ver de uno o ambos ojos.  Pierde el equilibrio.  Tiene movimientos anormales.  Siente sbitamente un dolor de cabeza muy intenso que no tiene causa aparente.  Le aparece dolor en el pecho.  Los latidos cardacos son inestables.  No est consciente, en parte o totalmente, de lo que ocurre a su alrededor. ASEGRESE DE QUE:   Comprende estas instrucciones.  Controlar su afeccin.  Recibir ayuda de inmediato si no mejora o si empeora.   Esta informacin no tiene Marine scientist el consejo del mdico. Asegrese de hacerle al mdico cualquier pregunta que tenga.   Document Released: 03/26/2009 Document Revised: 04/28/2014 Elsevier Interactive Patient Education [employer].

## 2015-02-20 ENCOUNTER — Ambulatory Visit (HOSPITAL_COMMUNITY): Payer: Medicare Other

## 2015-02-20 ENCOUNTER — Other Ambulatory Visit: Payer: Self-pay | Admitting: Family Medicine

## 2015-02-20 ENCOUNTER — Ambulatory Visit (HOSPITAL_COMMUNITY)
Admission: RE | Admit: 2015-02-20 | Discharge: 2015-02-20 | Disposition: A | Discharge status: Home / Self Care | Payer: Medicare Other | Source: Ambulatory Visit | Attending: Family Medicine | Admitting: Family Medicine

## 2015-02-20 DIAGNOSIS — J392 Other diseases of pharynx: Secondary | ICD-10-CM | POA: Diagnosis not present

## 2015-02-20 DIAGNOSIS — I672 Cerebral atherosclerosis: Secondary | ICD-10-CM | POA: Diagnosis not present

## 2015-02-20 DIAGNOSIS — I658 Occlusion and stenosis of other precerebral arteries: Secondary | ICD-10-CM | POA: Insufficient documentation

## 2015-02-20 DIAGNOSIS — I6622 Occlusion and stenosis of left posterior cerebral artery: Secondary | ICD-10-CM | POA: Diagnosis not present

## 2015-02-20 DIAGNOSIS — G458 Other transient cerebral ischemic attacks and related syndromes: Secondary | ICD-10-CM

## 2015-02-20 DIAGNOSIS — R220 Localized swelling, mass and lump, head: Secondary | ICD-10-CM | POA: Diagnosis not present

## 2015-02-20 DIAGNOSIS — K140 Glossitis: Secondary | ICD-10-CM | POA: Diagnosis not present

## 2015-02-20 DIAGNOSIS — E041 Nontoxic single thyroid nodule: Secondary | ICD-10-CM | POA: Diagnosis not present

## 2015-02-20 DIAGNOSIS — R030 Elevated blood-pressure reading, without diagnosis of hypertension: Secondary | ICD-10-CM | POA: Diagnosis not present

## 2015-02-20 DIAGNOSIS — K146 Glossodynia: Secondary | ICD-10-CM | POA: Diagnosis not present

## 2015-02-20 DIAGNOSIS — I6602 Occlusion and stenosis of left middle cerebral artery: Secondary | ICD-10-CM

## 2015-02-20 DIAGNOSIS — R4701 Aphasia: Secondary | ICD-10-CM | POA: Diagnosis not present

## 2015-02-20 DIAGNOSIS — R1310 Dysphagia, unspecified: Secondary | ICD-10-CM | POA: Diagnosis not present

## 2015-02-20 DIAGNOSIS — R202 Paresthesia of skin: Secondary | ICD-10-CM | POA: Diagnosis not present

## 2015-02-20 DIAGNOSIS — R471 Dysarthria and anarthria: Secondary | ICD-10-CM | POA: Insufficient documentation

## 2015-02-21 ENCOUNTER — Emergency Department (HOSPITAL_COMMUNITY): Payer: Medicare Other

## 2015-02-21 ENCOUNTER — Inpatient Hospital Stay (HOSPITAL_COMMUNITY)
Admission: EM | Admit: 2015-02-21 | Discharge: 2015-02-22 | DRG: 159 | Disposition: A | Discharge status: Home / Self Care | Payer: Medicare Other | Attending: Internal Medicine | Admitting: Internal Medicine

## 2015-02-21 ENCOUNTER — Encounter (HOSPITAL_COMMUNITY): Payer: Self-pay | Admitting: Internal Medicine

## 2015-02-21 ENCOUNTER — Ambulatory Visit: Payer: Medicare Other | Admitting: Cardiology

## 2015-02-21 DIAGNOSIS — J392 Other diseases of pharynx: Secondary | ICD-10-CM

## 2015-02-21 DIAGNOSIS — R202 Paresthesia of skin: Secondary | ICD-10-CM | POA: Diagnosis not present

## 2015-02-21 DIAGNOSIS — D3705 Neoplasm of uncertain behavior of pharynx: Secondary | ICD-10-CM | POA: Diagnosis not present

## 2015-02-21 DIAGNOSIS — R29810 Facial weakness: Secondary | ICD-10-CM | POA: Diagnosis present

## 2015-02-21 DIAGNOSIS — E041 Nontoxic single thyroid nodule: Secondary | ICD-10-CM | POA: Diagnosis present

## 2015-02-21 DIAGNOSIS — S01512A Laceration without foreign body of oral cavity, initial encounter: Secondary | ICD-10-CM | POA: Diagnosis not present

## 2015-02-21 DIAGNOSIS — K140 Glossitis: Principal | ICD-10-CM | POA: Diagnosis present

## 2015-02-21 DIAGNOSIS — R220 Localized swelling, mass and lump, head: Secondary | ICD-10-CM | POA: Diagnosis not present

## 2015-02-21 DIAGNOSIS — K146 Glossodynia: Secondary | ICD-10-CM | POA: Diagnosis present

## 2015-02-21 DIAGNOSIS — I672 Cerebral atherosclerosis: Secondary | ICD-10-CM | POA: Diagnosis present

## 2015-02-21 DIAGNOSIS — IMO0001 Reserved for inherently not codable concepts without codable children: Secondary | ICD-10-CM | POA: Diagnosis present

## 2015-02-21 DIAGNOSIS — R4701 Aphasia: Secondary | ICD-10-CM

## 2015-02-21 DIAGNOSIS — Z7982 Long term (current) use of aspirin: Secondary | ICD-10-CM | POA: Diagnosis not present

## 2015-02-21 DIAGNOSIS — R221 Localized swelling, mass and lump, neck: Secondary | ICD-10-CM | POA: Diagnosis not present

## 2015-02-21 DIAGNOSIS — R1310 Dysphagia, unspecified: Secondary | ICD-10-CM | POA: Diagnosis not present

## 2015-02-21 DIAGNOSIS — R471 Dysarthria and anarthria: Secondary | ICD-10-CM | POA: Diagnosis present

## 2015-02-21 DIAGNOSIS — R030 Elevated blood-pressure reading, without diagnosis of hypertension: Secondary | ICD-10-CM | POA: Diagnosis not present

## 2015-02-21 DIAGNOSIS — T783XXA Angioneurotic edema, initial encounter: Secondary | ICD-10-CM | POA: Insufficient documentation

## 2015-02-21 LAB — URINALYSIS, ROUTINE W REFLEX MICROSCOPIC
Bilirubin Urine: NEGATIVE
GLUCOSE, UA: NEGATIVE mg/dL
Ketones, ur: NEGATIVE mg/dL
Nitrite: NEGATIVE
PH: 6 (ref 5.0–8.0)
Protein, ur: NEGATIVE mg/dL
SPECIFIC GRAVITY, URINE: 1.022 (ref 1.005–1.030)
Urobilinogen, UA: 1 mg/dL (ref 0.0–1.0)

## 2015-02-21 LAB — DIFFERENTIAL
BASOS ABS: 0 10*3/uL (ref 0.0–0.1)
BASOS PCT: 0 %
EOS ABS: 0.2 10*3/uL (ref 0.0–0.7)
Eosinophils Relative: 2 %
Lymphocytes Relative: 19 %
Lymphs Abs: 1.9 10*3/uL (ref 0.7–4.0)
MONO ABS: 0.6 10*3/uL (ref 0.1–1.0)
Monocytes Relative: 6 %
NEUTROS ABS: 7.5 10*3/uL (ref 1.7–7.7)
Neutrophils Relative %: 73 %

## 2015-02-21 LAB — CBC
HEMATOCRIT: 42.3 % (ref 36.0–46.0)
HEMOGLOBIN: 13.7 g/dL (ref 12.0–15.0)
Hematocrit: 42 % (ref 34.0–46.6)
Hemoglobin: 13.8 g/dL (ref 11.1–15.9)
MCH: 29.9 pg (ref 26.6–33.0)
MCH: 28.8 pg (ref 26.0–34.0)
MCHC: 32.9 g/dL (ref 31.5–35.7)
MCHC: 32.4 g/dL (ref 30.0–36.0)
MCV: 91 fL (ref 79–97)
MCV: 88.9 fL (ref 78.0–100.0)
PLATELETS: 342 10*3/uL (ref 150–379)
PLATELETS: 304 10*3/uL (ref 150–400)
RBC: 4.62 x10E6/uL (ref 3.77–5.28)
RBC: 4.76 MIL/uL (ref 3.87–5.11)
RDW: 14.1 % (ref 12.3–15.4)
RDW: 13.5 % (ref 11.5–15.5)
WBC: 10.6 10*3/uL (ref 3.4–10.8)
WBC: 10.2 10*3/uL (ref 4.0–10.5)

## 2015-02-21 LAB — CMP14+EGFR
A/G RATIO: 1.4 (ref 1.1–2.5)
ALK PHOS: 102 IU/L (ref 39–117)
ALT: 14 IU/L (ref 0–32)
AST: 22 IU/L (ref 0–40)
Albumin: 4.2 g/dL (ref 3.5–4.8)
BILIRUBIN TOTAL: 0.4 mg/dL (ref 0.0–1.2)
BUN/Creatinine Ratio: 21 (ref 11–26)
BUN: 16 mg/dL (ref 8–27)
CHLORIDE: 95 mmol/L — AB (ref 97–106)
CO2: 22 mmol/L (ref 18–29)
Calcium: 9.3 mg/dL (ref 8.7–10.3)
Creatinine, Ser: 0.78 mg/dL (ref 0.57–1.00)
GFR calc Af Amer: 84 mL/min/{1.73_m2} (ref >59)
GFR calc non Af Amer: 73 mL/min/{1.73_m2} (ref >59)
GLOBULIN, TOTAL: 2.9 g/dL (ref 1.5–4.5)
Glucose: 63 mg/dL — ABNORMAL LOW (ref 65–99)
POTASSIUM: 4.5 mmol/L (ref 3.5–5.2)
SODIUM: 136 mmol/L (ref 136–144)
Total Protein: 7.1 g/dL (ref 6.0–8.5)

## 2015-02-21 LAB — RAPID URINE DRUG SCREEN, HOSP PERFORMED
AMPHETAMINES: NOT DETECTED
BENZODIAZEPINES: NOT DETECTED
Barbiturates: NOT DETECTED
COCAINE: NOT DETECTED
OPIATES: NOT DETECTED
Tetrahydrocannabinol: NOT DETECTED

## 2015-02-21 LAB — I-STAT CHEM 8, ED
BUN: 13 mg/dL (ref 6–20)
CALCIUM ION: 1.15 mmol/L (ref 1.13–1.30)
CREATININE: 0.7 mg/dL (ref 0.44–1.00)
Chloride: 105 mmol/L (ref 101–111)
Glucose, Bld: 95 mg/dL (ref 65–99)
HCT: 46 % (ref 36.0–46.0)
Hemoglobin: 15.6 g/dL — ABNORMAL HIGH (ref 12.0–15.0)
Potassium: 4.1 mmol/L (ref 3.5–5.1)
Sodium: 141 mmol/L (ref 135–145)
TCO2: 23 mmol/L (ref 0–100)

## 2015-02-21 LAB — LIPID PANEL
CHOLESTEROL TOTAL: 264 mg/dL — AB (ref 100–199)
Chol/HDL Ratio: 7.3 ratio units — ABNORMAL HIGH (ref 0.0–4.4)
HDL: 36 mg/dL — ABNORMAL LOW (ref >39)
LDL CALC: 175 mg/dL — AB (ref 0–99)
Triglycerides: 266 mg/dL — ABNORMAL HIGH (ref 0–149)
VLDL CHOLESTEROL CAL: 53 mg/dL — AB (ref 5–40)

## 2015-02-21 LAB — COMPREHENSIVE METABOLIC PANEL
ALT: 16 U/L (ref 14–54)
AST: 26 U/L (ref 15–41)
Albumin: 3.9 g/dL (ref 3.5–5.0)
Alkaline Phosphatase: 92 U/L (ref 38–126)
Anion gap: 12 (ref 5–15)
BUN: 11 mg/dL (ref 6–20)
CHLORIDE: 104 mmol/L (ref 101–111)
CO2: 24 mmol/L (ref 22–32)
CREATININE: 0.8 mg/dL (ref 0.44–1.00)
Calcium: 9.5 mg/dL (ref 8.9–10.3)
GFR calc non Af Amer: >60 mL/min (ref >60)
Glucose, Bld: 95 mg/dL (ref 65–99)
POTASSIUM: 4.1 mmol/L (ref 3.5–5.1)
SODIUM: 140 mmol/L (ref 135–145)
Total Bilirubin: 0.8 mg/dL (ref 0.3–1.2)
Total Protein: 7.4 g/dL (ref 6.5–8.1)

## 2015-02-21 LAB — URINE MICROSCOPIC-ADD ON

## 2015-02-21 LAB — CBG MONITORING, ED: Glucose-Capillary: 83 mg/dL (ref 65–99)

## 2015-02-21 LAB — PROTIME-INR
INR: 1.08 (ref 0.00–1.49)
Prothrombin Time: 14.2 seconds (ref 11.6–15.2)

## 2015-02-21 LAB — I-STAT TROPONIN, ED: TROPONIN I, POC: 0 ng/mL (ref 0.00–0.08)

## 2015-02-21 LAB — ETHANOL: Alcohol, Ethyl (B): <5 mg/dL (ref <5)

## 2015-02-21 LAB — APTT: aPTT: 30 seconds (ref 24–37)

## 2015-02-21 MED ORDER — METHYLPREDNISOLONE SODIUM SUCC 125 MG IJ SOLR
125.0000 mg | Freq: Once | INTRAMUSCULAR | Status: AC
  Administered 2015-02-21: 125 mg via INTRAVENOUS
  Filled 2015-02-21: qty 2

## 2015-02-21 MED ORDER — FAMOTIDINE IN NACL 20-0.9 MG/50ML-% IV SOLN
20.0000 mg | Freq: Once | INTRAVENOUS | Status: AC
  Administered 2015-02-21: 20 mg via INTRAVENOUS
  Filled 2015-02-21: qty 50

## 2015-02-21 MED ORDER — SODIUM CHLORIDE 0.9 % IV SOLN
INTRAVENOUS | Status: DC
  Administered 2015-02-21: 22:00:00 via INTRAVENOUS

## 2015-02-21 MED ORDER — ONDANSETRON HCL 4 MG/2ML IJ SOLN: 4.0000 mg | Freq: Four times a day (QID) | INTRAMUSCULAR | Status: DC | PRN

## 2015-02-21 MED ORDER — FAMOTIDINE IN NACL 20-0.9 MG/50ML-% IV SOLN
20.0000 mg | Freq: Two times a day (BID) | INTRAVENOUS | Status: DC
  Administered 2015-02-21 – 2015-02-22 (×2): 20 mg via INTRAVENOUS
  Filled 2015-02-21 (×3): qty 50

## 2015-02-21 MED ORDER — ACETAMINOPHEN 650 MG RE SUPP: 650.0000 mg | Freq: Four times a day (QID) | RECTAL | Status: DC | PRN

## 2015-02-21 MED ORDER — CLINDAMYCIN PHOSPHATE 600 MG/50ML IV SOLN
600.0000 mg | Freq: Three times a day (TID) | INTRAVENOUS | Status: DC
  Administered 2015-02-22 (×2): 600 mg via INTRAVENOUS
  Filled 2015-02-21 (×4): qty 50

## 2015-02-21 MED ORDER — CLINDAMYCIN PHOSPHATE 600 MG/50ML IV SOLN
600.0000 mg | Freq: Once | INTRAVENOUS | Status: AC
  Administered 2015-02-21: 600 mg via INTRAVENOUS
  Filled 2015-02-21: qty 50

## 2015-02-21 MED ORDER — ONDANSETRON HCL 4 MG PO TABS: 4.0000 mg | ORAL_TABLET | Freq: Four times a day (QID) | ORAL | Status: DC | PRN

## 2015-02-21 MED ORDER — ACETAMINOPHEN 325 MG PO TABS: 650.0000 mg | ORAL_TABLET | Freq: Four times a day (QID) | ORAL | Status: DC | PRN

## 2015-02-21 MED ORDER — SODIUM CHLORIDE 0.9 % IV SOLN
INTRAVENOUS | Status: DC
  Administered 2015-02-21: via INTRAVENOUS

## 2015-02-21 MED ORDER — METHYLPREDNISOLONE SODIUM SUCC 40 MG IJ SOLR
40.0000 mg | Freq: Two times a day (BID) | INTRAMUSCULAR | Status: DC
  Administered 2015-02-22: 40 mg via INTRAVENOUS
  Filled 2015-02-21 (×3): qty 1

## 2015-02-21 MED ORDER — IOHEXOL 300 MG/ML  SOLN
75.0000 mL | Freq: Once | INTRAMUSCULAR | Status: AC | PRN
  Administered 2015-02-21: 75 mL via INTRAVENOUS

## 2015-02-21 MED ORDER — FLUTICASONE PROPIONATE 50 MCG/ACT NA SUSP
2.0000 | Freq: Every day | NASAL | Status: DC
  Administered 2015-02-22: 2 via NASAL
  Filled 2015-02-21: qty 16

## 2015-02-21 MED ORDER — DIPHENHYDRAMINE HCL 25 MG PO CAPS
25.0000 mg | ORAL_CAPSULE | Freq: Once | ORAL | Status: AC
  Administered 2015-02-21: 25 mg via ORAL
  Filled 2015-02-21: qty 1

## 2015-02-21 MED ORDER — DIPHENHYDRAMINE HCL 50 MG/ML IJ SOLN
12.5000 mg | Freq: Four times a day (QID) | INTRAMUSCULAR | Status: DC | PRN
Start: 2015-02-21 — End: 2015-02-22

## 2015-02-21 NOTE — ED Notes (Signed)
Patient woke up at 5am with left sided facial numbness and difficulty swallowing LSN last night

## 2015-02-21 NOTE — ED Notes (Signed)
Patient transported to MRI 

## 2015-02-21 NOTE — Consult Note (Signed)
Referring Physician: ED MD    Chief Complaint: right facial droop and expressive difficulty  HPI:                                                                                                                                         [name] is an 81 y.o. female who [facility] for right tongue numbness, difficulty breathing for 10-15 minutes. MRI was obtained and showed no acute infarct but significant stenosis of proximal aspects of the two left MCA posterior M2 branches. Per son she awoke this AM with inability to talk or swallow.  When examining patient she is able to talk but states it is difficult due to her right tongue feeling swollen and pain in the parotid region.  She states she cannot feel the right aspect of her tongue and cannot feel when she swallows. She clearly has swelling in her right aspect of her tongue.   Date last known well: Date: 02/20/2015 Time last known well: Unable to determine tPA Given: No: out of window     Past Medical History  Diagnosis Date  . Unsteady gait   . Arthritis of right foot   . Palpitations   . Hx: UTI (urinary tract infection)     Past Surgical History  Procedure Laterality Date  . None      Family History  Problem Relation Age of Onset  . Heart attack Mother 59    Died in [place]   Social History:  reports that she has never smoked. She does not have any smokeless tobacco history on file. She reports that she does not drink alcohol or use illicit drugs.  Allergies: No Known Allergies  Medications:                                                                                                                           No current facility-administered medications for this encounter.   Current Outpatient Prescriptions  Medication Sig Dispense Refill  . aspirin EC 81 MG tablet Take 81 mg by mouth daily.    . fluticasone (FLONASE) 50 MCG/ACT nasal spray Place 2 sprays into both nostrils daily. 16 g 6  .  Ibuprofen (ADVIL) 200 MG CAPS Take 200 mg by mouth as needed (pain).     Marland Kitchen lisinopril (PRINIVIL,ZESTRIL) 20 MG tablet Take 1 tablet (  20 mg total) by mouth daily. (Patient not taking: Reported on 02/21/2015) 30 tablet 3  . traZODone (DESYREL) 50 MG tablet Take 0.5 tablets (25 mg total) by mouth at bedtime as needed for sleep. toma una mitad de una pastilla por la noche (Patient not taking: Reported on 02/19/2015) 30 tablet 3  . VOLTAREN 1 % GEL Apply to R ankle 3 to 4 times daily as needed for pain (Patient not taking: Reported on 02/21/2015) 100 g 2     ROS:                                                                                                                                       History obtained from the patient  General ROS: negative for - chills, fatigue, fever, night sweats, weight gain or weight loss Psychological ROS: negative for - behavioral disorder, hallucinations, memory difficulties, mood swings or suicidal ideation Ophthalmic ROS: negative for - blurry vision, double vision, eye pain or loss of vision ENT ROS: negative for - epistaxis, nasal discharge, oral lesions, sore throat, tinnitus or vertigo Allergy and Immunology ROS: negative for - hives or itchy/watery eyes Hematological and Lymphatic ROS: negative for - bleeding problems, bruising or swollen lymph nodes Endocrine ROS: negative for - galactorrhea, hair pattern changes, polydipsia/polyuria or temperature intolerance Respiratory ROS: negative for - cough, hemoptysis, shortness of breath or wheezing Cardiovascular ROS: negative for - chest pain, dyspnea on exertion, edema or irregular heartbeat Gastrointestinal ROS: negative for - abdominal pain, diarrhea, hematemesis, nausea/vomiting or stool incontinence Genito-Urinary ROS: negative for - dysuria, hematuria, incontinence or urinary frequency/urgency Musculoskeletal ROS: negative for - joint swelling or muscular weakness Neurological ROS: as noted in  HPI Dermatological ROS: negative for rash and skin lesion changes  Neurologic Examination:                                                                                                      Blood pressure 124/59, pulse 73, temperature 98.8 F (37.1 C), temperature source Oral, SpO2 94 %.  HEENT-  Normocephalic, no lesions, without obvious abnormality.  Normal external eye and conjunctiva.  Normal TM's bilaterally.  Normal auditory canals and external ears. Normal external nose, mucus membranes and septum.  Normal pharynx. Cardiovascular- S1, S2 normal, pulses palpable throughout   Lungs- chest clear, no wheezing, rales, normal symmetric air entry Abdomen- normal findings: bowel sounds normal Extremities- no edema Lymph-no adenopathy palpable Musculoskeletal-no joint tenderness, deformity or swelling Skin-warm and dry, no hyperpigmentation, vitiligo,  or suspicious lesions  Neurological Examination Mental Status: Alert, oriented, thought content appropriate.  Speech fluent without evidence of aphasia.  Able to follow 3 step commands without difficulty. Cranial Nerves: II: Discs flat bilaterally; Visual fields grossly normal, pupils equal, round, reactive to light and accommodation III,IV, VI: ptosis not present, extra-ocular motions intact bilaterally V,VII: smile asymmetric with right facial droop, frontalis moves symmetrically, facial light touch sensation normal bilaterally--with pain to palpation of right mandible  VIII: hearing normal bilaterally IX,X: uvula rises symmetrically XI: bilateral shoulder shrug XII: midline tongue extension--visibly swollen on the right Motor: Right : Upper extremity   5/5    Left:     Upper extremity   5/5  Lower extremity   5/5     Lower extremity   5/5 Tone and bulk:normal tone throughout; no atrophy noted Sensory: Pinprick and light touch intact throughout, bilaterally Deep Tendon Reflexes: 2+ and symmetric throughout Plantars: Right:  downgoing   Left: downgoing Cerebellar: normal finger-to-nose and normal heel-to-shin test Gait: not tested due to safety       Lab Results: Basic Metabolic Panel:  Recent Labs Lab 02/19/15 1843 02/21/15 1307  NA 136 141  K 4.5 4.1  CL 95* 105  CO2 22  --   GLUCOSE 63* 95  BUN 16 13  CREATININE 0.78 0.70  CALCIUM 9.3  --     Liver Function Tests:  Recent Labs Lab 02/19/15 1843  AST 22  ALT 14  ALKPHOS 102  BILITOT 0.4  PROT 7.1  ALBUMIN 4.2   No results for input(s): LIPASE, AMYLASE in the last 168 hours. No results for input(s): AMMONIA in the last 168 hours.  CBC:  Recent Labs Lab 02/19/15 1843 02/21/15 1245 02/21/15 1307  WBC 10.6 10.2  --   NEUTROABS  --  7.5  --   HGB  --  13.7 15.6*  HCT 42.0 42.3 46.0  MCV  --  88.9  --   PLT  --  304  --     Cardiac Enzymes: No results for input(s): CKTOTAL, CKMB, CKMBINDEX, TROPONINI in the last 168 hours.  Lipid Panel:  Recent Labs Lab 02/19/15 1843  CHOL 264*  TRIG 266*  HDL 36*  CHOLHDL 7.3*  LDLCALC 175*    CBG:  Recent Labs Lab 02/21/15 1238  [place] 83    Microbiology: Results for orders placed or performed in visit on 11/23/14  Urine culture     Status: None   Collection Time: 11/23/14  4:33 PM  Result Value Ref Range Status   Urine Culture, Routine Final report  Final   Urine Culture result 1 Comment  Final    Comment: Greater than 2 organisms recovered, none predominant. Please submit another sample if clinically indicated. 25,000-50,000 colony forming units per mL     Coagulation Studies:  Recent Labs  02/21/15 1245  LABPROT 14.2  INR 1.08    Imaging: Mr Virgel Paling Wo Contrast  02/20/2015  CLINICAL DATA:  81 year old female with dysarthria and facial deficits suspicious for TIA. Intermittent left face and tongue numbness for 1 month. Initial encounter. EXAM: MRI HEAD WITHOUT CONTRAST MRA HEAD WITHOUT CONTRAST TECHNIQUE: Multiplanar, multiecho pulse sequences of the  brain and surrounding structures were obtained without intravenous contrast. Angiographic images of the head were obtained using MRA technique without contrast. COMPARISON:  [facility] brain MRI report 02/21/2011 (no images available). FINDINGS: MRI HEAD FINDINGS No restricted diffusion or evidence of acute infarction. Major intracranial vascular flow voids  are preserved. Cerebral volume is within normal limits for age. No midline shift, mass effect, evidence of mass lesion, ventriculomegaly, extra-axial collection or acute intracranial hemorrhage. Cervicomedullary junction and pituitary are within normal limits. CSF pulsation artifact suspected in the left subarachnoid space at the anterior insula (series 5 image 13), with no signal abnormality there on any other sequence. [place] Cables and white matter signal is within normal limits for age throughout the brain. No cortical encephalomalacia or definite chronic cerebral blood products. Visible internal auditory structures appear normal. Mastoids are clear. Trace paranasal sinus mucosal thickening. Orbit and face soft tissues appear normal. There is a smooth polypoid soft tissue lesion in the midline of the nasopharynx measuring 13-14 mm diameter (sagittal image 11). This does demonstrate increased diffusion (series 100, image 4). It does not resemble typical adenoid hypertrophy or Tornwaldt cyst. Otherwise no skullbase abnormality identified. Normal stylomastoid foramina. Normal bone marrow signal. Negative visualized cervical spine. MRA HEAD FINDINGS Antegrade flow in the posterior circulation with codominant distal vertebral arteries. Normal PICA origins. Normal vertebrobasilar junction. No basilar stenosis. SCA and right PCA origins are normal. Posterior communicating arteries are diminutive or absent. The left PCA appears occluded just beyond its origin in the P1 segment. No distal reconstituted left PCA flow signal identified. Right PCA branches appear  normal. Antegrade flow in both ICA siphons. There is irregularity of the distal cervical ICAs greater on the left. The left ICA also appears to have the retropharyngeal course. Antegrade flow in both ICA siphons. No siphon stenosis. Ophthalmic artery origins are within normal limits. Bilateral carotid termini are patent. MCA and ACA origins are within normal limits. Anterior communicating artery and visualized ACA branches are within normal limits. Visualized right MCA branches are within normal limits. The left MCA M1 segment is within normal limits. There is an early left MCA bifurcation. There are high-grade stenoses in 2 posterior MCA M2 segment divisions with preserved distal flow. See 109 image 14. The anterior M2 branch and other left MCA branches are within normal limits. IMPRESSION: 1. No acute infarct and largely unremarkable for age noncontrast MRI appearance of the brain. 2. Left PCA occlusion, presumably chronic in light of no acute MRI findings in that vascular territory. 3. 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 4. Polypoid up to 1.4 cm soft tissue lesion in the midline at the nasopharynx, not typical in appearance for Tornwaldt cyst, but appearance does favor benign etiology. Recommend ENT follow-up. Study discussed by telephone with Dr. Kenn File on 02/20/2015 at 1710 hours. Electronically Signed   By: Genevie Ann M.D.   On: 02/20/2015 17:14   Mr Brain Wo Contrast  02/20/2015  CLINICAL DATA:  81 year old female with dysarthria and facial deficits suspicious for TIA. Intermittent left face and tongue numbness for 1 month. Initial encounter. EXAM: MRI HEAD WITHOUT CONTRAST MRA HEAD WITHOUT CONTRAST TECHNIQUE: Multiplanar, multiecho pulse sequences of the brain and surrounding structures were obtained without intravenous contrast. Angiographic images of the head were obtained using MRA technique without contrast. COMPARISON:  [facility]  brain MRI report 02/21/2011 (no images available). FINDINGS: MRI HEAD FINDINGS No restricted diffusion or evidence of acute infarction. Major intracranial vascular flow voids are preserved. Cerebral volume is within normal limits for age. No midline shift, mass effect, evidence of mass lesion, ventriculomegaly, extra-axial collection or acute intracranial hemorrhage. Cervicomedullary junction and pituitary are within normal limits. CSF pulsation artifact suspected in the left subarachnoid space at the anterior insula (  series 5 image 13), with no signal abnormality there on any other sequence. Pearline Cables and white matter signal is within normal limits for age throughout the brain. No cortical encephalomalacia or definite chronic cerebral blood products. Visible internal auditory structures appear normal. Mastoids are clear. Trace paranasal sinus mucosal thickening. Orbit and face soft tissues appear normal. There is a smooth polypoid soft tissue lesion in the midline of the nasopharynx measuring 13-14 mm diameter (sagittal image 11). This does demonstrate increased diffusion (series 100, image 4). It does not resemble typical adenoid hypertrophy or Tornwaldt cyst. Otherwise no skullbase abnormality identified. Normal stylomastoid foramina. Normal bone marrow signal. Negative visualized cervical spine. MRA HEAD FINDINGS Antegrade flow in the posterior circulation with codominant distal vertebral arteries. Normal PICA origins. Normal vertebrobasilar junction. No basilar stenosis. SCA and right PCA origins are normal. Posterior communicating arteries are diminutive or absent. The left PCA appears occluded just beyond its origin in the P1 segment. No distal reconstituted left PCA flow signal identified. Right PCA branches appear normal. Antegrade flow in both ICA siphons. There is irregularity of the distal cervical ICAs greater on the left. The left ICA also appears to have the retropharyngeal course. Antegrade flow in both  ICA siphons. No siphon stenosis. Ophthalmic artery origins are within normal limits. Bilateral carotid termini are patent. MCA and ACA origins are within normal limits. Anterior communicating artery and visualized ACA branches are within normal limits. Visualized right MCA branches are within normal limits. The left MCA M1 segment is within normal limits. There is an early left MCA bifurcation. There are high-grade stenoses in 2 posterior MCA M2 segment divisions with preserved distal flow. See 109 image 14. The anterior M2 branch and other left MCA branches are within normal limits. IMPRESSION: 1. No acute infarct and largely unremarkable for age noncontrast MRI appearance of the brain. 2. Left PCA occlusion, presumably chronic in light of no acute MRI findings in that vascular territory. 3. 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 4. Polypoid up to 1.4 cm soft tissue lesion in the midline at the nasopharynx, not typical in appearance for Tornwaldt cyst, but appearance does favor benign etiology. Recommend ENT follow-up. Study discussed by telephone with Dr. Kenn File on 02/20/2015 at 1710 hours. Electronically Signed   By: Genevie Ann M.D.   On: 02/20/2015 17:14       Assessment and plan discussed with with attending physician and they are in agreement.    Etta Quill PA-C [facility] [phone]  02/21/2015, 1:42 PM   Assessment: 81 y.o. female with right tongue swelling. I do not think there is a neurological cause. She also has intracranial atherosclerosis and I agree with antiplatelet therapy. She will need PCP follow up to manage atherosclerosis.   Stroke Risk Factors - none  1) asa 81mg  daily 2) outpatient cholesterol check for known atherosclerosis.  3) No further recs at this time. Please call with further questions or concerns.   Roland Rack, MD [facility] [phone]  If 7pm- 7am, please page neurology  on call as listed in [place].

## 2015-02-21 NOTE — ED Notes (Signed)
Patient transported to CT 

## 2015-02-21 NOTE — ED Provider Notes (Signed)
Patient last seen normal at 5 AM today. Patient awakened with speaking, associated with some pain on the right side of her mouth. She was seen yesterday at [facility] for similar complaint emergency department and sent home after evaluation with MRI and MRA study. On exam patient is alert HEENT exam right-sided mouth droop, poor dentition. Right side of tongue is somewhat swollen No tenderness, moves all extremities well. Motor strength 5 over 5 overall. Medical decision-making there is a new right-sided central cranial nerve VII deficit. I spoke with Dr. Leonel Ramsay from neurology service and consult him to evaluate patient in the emergency department. He requested repeat MRI MRA study which has been ordered. I discussed MRI scan with radiologist requested CT scan of soft tissue neck with contrast. Pt is signed out to Dr. Oleta Mouse at 5 pm. Results for orders placed or performed during the hospital encounter of 02/21/15  Ethanol  Result Value Ref Range   Alcohol, Ethyl (B) <5 <5 mg/dL  Protime-INR  Result Value Ref Range   Prothrombin Time 14.2 11.6 - 15.2 seconds   INR 1.08 0.00 - 1.49  APTT  Result Value Ref Range   aPTT 30 24 - 37 seconds  CBC  Result Value Ref Range   WBC 10.2 4.0 - 10.5 K/uL   RBC 4.76 3.87 - 5.11 MIL/uL   Hemoglobin 13.7 12.0 - 15.0 g/dL   HCT 42.3 36.0 - 46.0 %   MCV 88.9 78.0 - 100.0 fL   MCH 28.8 26.0 - 34.0 pg   MCHC 32.4 30.0 - 36.0 g/dL   RDW 13.5 11.5 - 15.5 %   Platelets 304 150 - 400 K/uL  Differential  Result Value Ref Range   Neutrophils Relative % 73 %   Neutro Abs 7.5 1.7 - 7.7 K/uL   Lymphocytes Relative 19 %   Lymphs Abs 1.9 0.7 - 4.0 K/uL   Monocytes Relative 6 %   Monocytes Absolute 0.6 0.1 - 1.0 K/uL   Eosinophils Relative 2 %   Eosinophils Absolute 0.2 0.0 - 0.7 K/uL   Basophils Relative 0 %   Basophils Absolute 0.0 0.0 - 0.1 K/uL  Comprehensive metabolic panel  Result Value Ref Range   Sodium 140 135 - 145 mmol/L   Potassium 4.1  3.5 - 5.1 mmol/L   Chloride 104 101 - 111 mmol/L   CO2 24 22 - 32 mmol/L   Glucose, Bld 95 65 - 99 mg/dL   BUN 11 6 - 20 mg/dL   Creatinine, Ser 0.80 0.44 - 1.00 mg/dL   Calcium 9.5 8.9 - 10.3 mg/dL   Total Protein 7.4 6.5 - 8.1 g/dL   Albumin 3.9 3.5 - 5.0 g/dL   AST 26 15 - 41 U/L   ALT 16 14 - 54 U/L   Alkaline Phosphatase 92 38 - 126 U/L   Total Bilirubin 0.8 0.3 - 1.2 mg/dL   GFR calc non Af Amer >60 >60 mL/min   GFR calc Af Amer >60 >60 mL/min   Anion gap 12 5 - 15  I-Stat Chem 8, ED  (not at [facility])  Result Value Ref Range   Sodium 141 135 - 145 mmol/L   Potassium 4.1 3.5 - 5.1 mmol/L   Chloride 105 101 - 111 mmol/L   BUN 13 6 - 20 mg/dL   Creatinine, Ser 0.70 0.44 - 1.00 mg/dL   Glucose, Bld 95 65 - 99 mg/dL   Calcium, Ion 1.15 1.13 - 1.30 mmol/L   TCO2  23 0 - 100 mmol/L   Hemoglobin 15.6 (H) 12.0 - 15.0 g/dL   HCT 46.0 36.0 - 46.0 %  I-stat troponin, ED (not at [facility])  Result Value Ref Range   Troponin i, poc 0.00 0.00 - 0.08 ng/mL   Comment 3          CBG monitoring, ED  Result Value Ref Range   Glucose-Capillary 83 65 - 99 mg/dL   Mr Virgel Paling Wo Contrast  02/20/2015  CLINICAL DATA:  81 year old female with dysarthria and facial deficits suspicious for TIA. Intermittent left face and tongue numbness for 1 month. Initial encounter. EXAM: MRI HEAD WITHOUT CONTRAST MRA HEAD WITHOUT CONTRAST TECHNIQUE: Multiplanar, multiecho pulse sequences of the brain and surrounding structures were obtained without intravenous contrast. Angiographic images of the head were obtained using MRA technique without contrast. COMPARISON:  [facility] brain MRI report 02/21/2011 (no images available). FINDINGS: MRI HEAD FINDINGS No restricted diffusion or evidence of acute infarction. Major intracranial vascular flow voids are preserved. Cerebral volume is within normal limits for age. No midline shift, mass effect, evidence of mass lesion, ventriculomegaly, extra-axial  collection or acute intracranial hemorrhage. Cervicomedullary junction and pituitary are within normal limits. CSF pulsation artifact suspected in the left subarachnoid space at the anterior insula (series 5 image 13), with no signal abnormality there on any other sequence. Pear[place] Cables and white matter signal is within normal limits for age throughout the brain. No cortical encephalomalacia or definite chronic cerebral blood products. Visible internal auditory structures appear normal. Mastoids are clear. Trace paranasal sinus mucosal thickening. Orbit and face soft tissues appear normal. There is a smooth polypoid soft tissue lesion in the midline of the nasopharynx measuring 13-14 mm diameter (sagittal image 11). This does demonstrate increased diffusion (series 100, image 4). It does not resemble typical adenoid hypertrophy or Tornwaldt cyst. Otherwise no skullbase abnormality identified. Normal stylomastoid foramina. Normal bone marrow signal. Negative visualized cervical spine. MRA HEAD FINDINGS Antegrade flow in the posterior circulation with codominant distal vertebral arteries. Normal PICA origins. Normal vertebrobasilar junction. No basilar stenosis. SCA and right PCA origins are normal. Posterior communicating arteries are diminutive or absent. The left PCA appears occluded just beyond its origin in the P1 segment. No distal reconstituted left PCA flow signal identified. Right PCA branches appear normal. Antegrade flow in both ICA siphons. There is irregularity of the distal cervical ICAs greater on the left. The left ICA also appears to have the retropharyngeal course. Antegrade flow in both ICA siphons. No siphon stenosis. Ophthalmic artery origins are within normal limits. Bilateral carotid termini are patent. MCA and ACA origins are within normal limits. Anterior communicating artery and visualized ACA branches are within normal limits. Visualized right MCA branches are within normal limits. The left MCA M1  segment is within normal limits. There is an early left MCA bifurcation. There are high-grade stenoses in 2 posterior MCA M2 segment divisions with preserved distal flow. See 109 image 14. The anterior M2 branch and other left MCA branches are within normal limits. IMPRESSION: 1. No acute infarct and largely unremarkable for age noncontrast MRI appearance of the brain. 2. Left PCA occlusion, presumably chronic in light of no acute MRI findings in that vascular territory. 3. 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 4. Polypoid up to 1.4 cm soft tissue lesion in the midline at the nasopharynx, not typical in appearance for Tornwaldt cyst, but appearance does favor benign etiology. Recommend ENT  follow-up. Study discussed by telephone with Dr. Kenn File on 02/20/2015 at 1710 hours. Electronically Signed   By: Genevie Ann M.D.   On: 02/20/2015 17:14   Mr Brain Wo Contrast  02/21/2015  CLINICAL DATA:  81 year old female with right tongue numbness, difficulty swallowing and talking. On physical exam the right aspect over tongue appear swollen. Outpatient brain MRI and intracranial MRA yesterday negative for acute stroke, but positive for intracranial atherosclerosis. Subsequent encounter. EXAM: MRI HEAD WITHOUT CONTRAST TECHNIQUE: Multiplanar, multiecho pulse sequences of the brain and surrounding structures were obtained without intravenous contrast. COMPARISON:  Brain MRI and MRA 02/20/15. FINDINGS: Unchanged cerebral volume. Major intracranial vascular flow voids are stable. No restricted diffusion to suggest acute infarction. No midline shift, mass effect, evidence of mass lesion, ventriculomegaly, extra-axial collection or acute intracranial hemorrhage. Cervicomedullary junction and pituitary are within normal limits. Stable gray and white matter signal since yesterday. Negative visualized cervical spine. Normal bone marrow signal. Unchanged nasopharynx. Paranasal sinuses  and mastoids remain clear. Negative orbit and scalp soft tissues. Today examination was performed such that the entire mouth and jaw is visible. There is abnormal T2 hyperintensity and enlargement in the right tongue (series 6, image 7). There is asymmetric T2 hyperintensity resembling a small volume of tracking fluid in the right parapharyngeal and submandibular spaces (series 6, image 3). There may be a small volume of right sublingual space fluid. The mandible is intact with normal bone marrow signal. The submandibular glands are not definitely asymmetric. The cervical right ICA flow void is tortuous and difficult to trace throughout its course. There is associated mild effacement of the right lateral pharynx. No lymphadenopathy. IMPRESSION: 1. No acute infarct and stable noncontrast MRI appearance of the brain since yesterday. No skullbase abnormality identified. 2. However, there is T2 hyperintensity and enlargement in the right aspect of the tongue associated with inflammatory appearing signal changes in the right parapharyngeal, submandibular, and possibly also sublingual space. No lymphadenopathy or drainable fluid collection suggested. Recommend follow-up neck CT with IV contrast to evaluate further. Study discussed by telephone with Dr. Orlie Dakin on 02/21/2015 at 1600 hours. Electronically Signed   By: Genevie Ann M.D.   On: 02/21/2015 16:18   Mr Brain Wo Contrast  02/20/2015  CLINICAL DATA:  81 year old female with dysarthria and facial deficits suspicious for TIA. Intermittent left face and tongue numbness for 1 month. Initial encounter. EXAM: MRI HEAD WITHOUT CONTRAST MRA HEAD WITHOUT CONTRAST TECHNIQUE: Multiplanar, multiecho pulse sequences of the brain and surrounding structures were obtained without intravenous contrast. Angiographic images of the head were obtained using MRA technique without contrast. COMPARISON:  [facility] brain MRI report 02/21/2011 (no images available).  FINDINGS: MRI HEAD FINDINGS No restricted diffusion or evidence of acute infarction. Major intracranial vascular flow voids are preserved. Cerebral volume is within normal limits for age. No midline shift, mass effect, evidence of mass lesion, ventriculomegaly, extra-axial collection or acute intracranial hemorrhage. Cervicomedullary junction and pituitary are within normal limits. CSF pulsation artifact suspected in the left subarachnoid space at the anterior insula (series 5 image 13), with no signal abnormality there on any other sequence. Pear[place] Cables and white matter signal is within normal limits for age throughout the brain. No cortical encephalomalacia or definite chronic cerebral blood products. Visible internal auditory structures appear normal. Mastoids are clear. Trace paranasal sinus mucosal thickening. Orbit and face soft tissues appear normal. There is a smooth polypoid soft tissue lesion in the midline of the nasopharynx measuring 13-14 mm diameter (  sagittal image 11). This does demonstrate increased diffusion (series 100, image 4). It does not resemble typical adenoid hypertrophy or Tornwaldt cyst. Otherwise no skullbase abnormality identified. Normal stylomastoid foramina. Normal bone marrow signal. Negative visualized cervical spine. MRA HEAD FINDINGS Antegrade flow in the posterior circulation with codominant distal vertebral arteries. Normal PICA origins. Normal vertebrobasilar junction. No basilar stenosis. SCA and right PCA origins are normal. Posterior communicating arteries are diminutive or absent. The left PCA appears occluded just beyond its origin in the P1 segment. No distal reconstituted left PCA flow signal identified. Right PCA branches appear normal. Antegrade flow in both ICA siphons. There is irregularity of the distal cervical ICAs greater on the left. The left ICA also appears to have the retropharyngeal course. Antegrade flow in both ICA siphons. No siphon stenosis. Ophthalmic artery  origins are within normal limits. Bilateral carotid termini are patent. MCA and ACA origins are within normal limits. Anterior communicating artery and visualized ACA branches are within normal limits. Visualized right MCA branches are within normal limits. The left MCA M1 segment is within normal limits. There is an early left MCA bifurcation. There are high-grade stenoses in 2 posterior MCA M2 segment divisions with preserved distal flow. See 109 image 14. The anterior M2 branch and other left MCA branches are within normal limits. IMPRESSION: 1. No acute infarct and largely unremarkable for age noncontrast MRI appearance of the brain. 2. Left PCA occlusion, presumably chronic in light of no acute MRI findings in that vascular territory. 3. 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 4. Polypoid up to 1.4 cm soft tissue lesion in the midline at the nasopharynx, not typical in appearance for Tornwaldt cyst, but appearance does favor benign etiology. Recommend ENT follow-up. Study discussed by telephone with Dr. Kenn File on 02/20/2015 at 1710 hours. Electronically Signed   By: Genevie Ann M.D.   On: 02/20/2015 17:14     Orlie Dakin, MD 02/21/15 1713

## 2015-02-21 NOTE — ED Provider Notes (Signed)
Please see previous provider's note regarding patient's presenting history and physical, initial ED course, and associated MDM. Patient is Spanish speaking, and history was provided by the patient's son who is at bedside. She is 81 years old, and presented with 3-4 day history of some pain and swelling to the right side of her mouth. She was initially seen at [facility] one day prior and worked up for potential CVA given that she was having some difficulty speaking at that time. A workup was unremarkable, but she returns the ED today for difficulty speaking and swelling that appears to be worsening. Initially neurology was involved in her care and recommended repeat MRI that was stable. Given some soft tissue swelling around her neck and in her oropharynx, CT of her neck was also ordered and pending at time of sign out. This is visualized and reviewed by radiology. She has evidence of swelling and inflammation involving the right side of her tongue that extends posteriorly down to the vallecula. She also has involvement of her right submandibular gland. On further evaluation of the patient, family reports that her voice does seem more muffled, but she is handling her secretions and does not complain of any difficulty breathing. Only takes aspirin and ibuprofen for medications, and has had no recent new medications or allergic exposures. No fevers, chills, or evidence of infection in her oropharynx. I discussed this patient with ENT, who did recommend treatment for possible allergic versus infectious etiology. Given clindamycin, solumedrol, benadryl, and famotidine. They will follow up with the patient tomorrow for laryngoscopy and direct visualization. Admitted for airway monitoring.  Forde Dandy, MD 02/21/15 2038

## 2015-02-21 NOTE — Progress Notes (Signed)
Pt declined flu and pneumonia vaccine today.  She would like to receive both vaccines upon discharge.  She has concern over receiving anything new and contributing to tongue swelling.  RN provided education on flu and pneumonia vaccine.

## 2015-02-21 NOTE — ED Notes (Signed)
Patient to [facility] Ed with C/O increased facial numbness.  She was seen at [facility] for the same yesterday but states that her condition has gotten worse.  She is unable to speak clearly of swallow.  An MRI was done at [facility] yesterday that was negative for stroke per family.

## 2015-02-21 NOTE — H&P (Signed)
[facility] History and Physical  [name] NID:[id number] DOB: [birth year] DOA: 02/21/2015  Referring physician: Dr. Oleta Mouse. PCP: Evelina Dun, FNP  Specialists: None.  Chief Complaint: Difficulty talking and tongue numbness.  History obtained from patient's son as patient does not speak [place].  HPI: [name] is a 81 y.o. female with no significant past medical history who was recently to cardiology office for palpitations was brought to the ER patient was having persistent difficulty talking and numbness of the right side of the tongue. Patient's symptoms started 10 days ago when patient had swelling and numbness of the tongue which resolved without any intervention. Patient's symptoms again recurred 3 days ago and was taken to the ER where patient was ruled out of stroke with MRI brain. Since patient's symptoms worsened today morning patient was brought to the ER. Again MRI was done which was negative for any stroke. On exam patient has a right tongue swelling and CT of the neck soft tissue was done which shows swelling around the right side of the tongue and submandibular areas. On-call ENT surgeon Dr. Benjamine Mola has been consulted and patient was started on Solu-Medrol and clindamycin and admitted for further management. Patient still has mild pain around the area but denies any difficulty swallowing. Patient's pain increases on swallowing. Denies any recent use of any medications. Denies any insect bites.   Review of Systems: As presented in the history of presenting illness, rest negative.  Past Medical History  Diagnosis Date  . Unsteady gait   . Arthritis of right foot   . Palpitations   . Hx: UTI (urinary tract infection)    Past Surgical History  Procedure Laterality Date  . None     Social History:  reports that she has never smoked. She does not have any smokeless tobacco history on file. She reports that she does not drink alcohol or use illicit drugs. Where does  patient live at home. Can patient participate in ADLs? Yes.  No Known Allergies  Family History:  Family History  Problem Relation Age of Onset  . Heart attack Mother 78    Died in [place]      Prior to Admission medications   Medication Sig Start Date End Date Taking? Authorizing Provider  aspirin EC 81 MG tablet Take 81 mg by mouth daily.   Yes Historical Provider, MD  fluticasone (FLONASE) 50 MCG/ACT nasal spray Place 2 sprays into both nostrils daily. 02/19/15  Yes Timmothy Euler, MD  Ibuprofen (ADVIL) 200 MG CAPS Take 200 mg by mouth as needed (pain).    Yes Historical Provider, MD  lisinopril (PRINIVIL,ZESTRIL) 20 MG tablet Take 1 tablet (20 mg total) by mouth daily. Patient not taking: Reported on 02/21/2015 02/19/15   Timmothy Euler, MD  traZODone (DESYREL) 50 MG tablet Take 0.5 tablets (25 mg total) by mouth at bedtime as needed for sleep. toma una mitad de una pastilla por la noche Patient not taking: Reported on 02/19/2015 01/05/15   Fransisca Kaufmann Dettinger, MD  VOLTAREN 1 % GEL Apply to R ankle 3 to 4 times daily as needed for pain Patient not taking: Reported on 02/21/2015 02/19/15 02/19/16  Timmothy Euler, MD    Physical Exam: Filed Vitals:   02/21/15 2015 02/21/15 2030 02/21/15 2045 02/21/15 2132  BP: 127/55 118/47 127/49 149/55  Pulse: 65 66 63 73  Temp:    98.1 F (36.7 C)  TempSrc:    Oral  Resp: 27 18 24 23   Height:  5\' 4"  (1.626 m)  Weight:    67.1 kg (147 lb 14.9 oz)  SpO2: 92% 94% 92% 96%     General:  Moderately built and nourished.  Eyes: Anicteric no pallor.  ENT: Mild swelling of the right side of the tongue and there is mild tenderness in the right submandibular area. There is facial swelling on the right side.  Neck: Mild tenderness in the right submandibular area.  Cardiovascular: S1 and S2 heard.  Respiratory: No rhonchi or crepitations.  Abdomen: Soft nontender bowel sounds present.  Skin: No rash.  Musculoskeletal: No  edema.  Psychiatric: Appears normal.  Neurologic: Alert awake oriented to time place and person. Moves all extremities.  Labs on Admission:  Basic Metabolic Panel:  Recent Labs Lab 02/19/15 1843 02/21/15 1245 02/21/15 1307  NA 136 140 141  K 4.5 4.1 4.1  CL 95* 104 105  CO2 22 24  --   GLUCOSE 63* 95 95  BUN 16 11 13   CREATININE 0.78 0.80 0.70  CALCIUM 9.3 9.5  --    Liver Function Tests:  Recent Labs Lab 02/19/15 1843 02/21/15 1245  AST 22 26  ALT 14 16  ALKPHOS 102 92  BILITOT 0.4 0.8  PROT 7.1 7.4  ALBUMIN 4.2 3.9   No results for input(s): LIPASE, AMYLASE in the last 168 hours. No results for input(s): AMMONIA in the last 168 hours. CBC:  Recent Labs Lab 02/19/15 1843 02/21/15 1245 02/21/15 1307  WBC 10.6 10.2  --   NEUTROABS  --  7.5  --   HGB  --  13.7 15.6*  HCT 42.0 42.3 46.0  MCV  --  88.9  --   PLT  --  304  --    Cardiac Enzymes: No results for input(s): CKTOTAL, CKMB, CKMBINDEX, TROPONINI in the last 168 hours.  BNP (last 3 results) No results for input(s): BNP in the last 8760 hours.  ProBNP (last 3 results) No results for input(s): PROBNP in the last 8760 hours.  CBG:  Recent Labs Lab 02/21/15 1238  GLUCAP 83    Radiological Exams on Admission: Ct Soft Tissue Neck W Contrast  02/21/2015  CLINICAL DATA:  Swelling in the right side of the tongue. Difficulty swallowing. Left-sided facial numbness. EXAM: CT NECK WITH CONTRAST TECHNIQUE: Multidetector CT imaging of the neck was performed using the standard protocol following the bolus administration of intravenous contrast. CONTRAST:  35mL OMNIPAQUE IOHEXOL 300 MG/ML  SOLN COMPARISON:  MRI brain 02/21/2015 and MRI brain 02/20/2015. FINDINGS: Pharynx and larynx: Edematous changes are present at the right base of tongue extending into the vallecula. It is unclear whether this represents focal mucosal edema or a mass lesion at the right base of tongue. The asymmetric enhancement appears to  extend into the vallecula. There is asymmetric lateral deviation of the right true vocal cord. Salivary glands: Marked inflammatory changes are noted alignment the medial and superior aspect of the right submandibular gland. This extends into the right side of the tongue. Edematous changes are noted into the anterior aspect of the right-sided the tongue. There is some stranding in the right parapharyngeal fat compared to the left. No definite duct obstruction is present although there is some inflammatory change about the submandibular duct. Benign appearing lymph nodes are evident within the parotid glands. The left submandibular gland is within normal limits. Thyroid: There is mild asymmetry of the thyroid. A 10 mm nodule is present on the right. Lymph nodes: An 11 mm somewhat rounded  posterior left level 2 lymph node is present. A 10 mm somewhat rounded right level 2 lymph nodes present. These are likely reactive. No definite pathologic nodes are seen. Vascular: Atherosclerotic calcifications are present at both carotid bifurcations without significant stenoses relative to the more distal vessel. Limited intracranial: The visualized posterior fossa is unremarkable. Visualized orbits: Not visualize Mastoids and visualized paranasal sinuses: Clear Skeleton: Template degenerative change in uncovertebral disease is most severe at C3-4. This is worse left than right. Vertebral body heights are normal. There is slight anterolisthesis at C6-7. Upper chest: Interstitial thickening is more prominent right than left. This may reflect some edema. IMPRESSION: 1. Edematous changes at the right base of tongue, right submandibular gland and extending into the anterior tongue which was visualize static MRI. This appears to be inflammatory process may be related to inflammation within the right submandibular duct or right-sided cell adenitis. No discrete abscess is present. 2. Bilateral borderline level 2 lymph nodes are likely  reactive. 3. Asymmetric enhancement and thickening of the mucosa at the right base of tongue extending into the vallecular. Recommend direct evaluation of this area for possible neoplasm. This most likely represents inflammatory change. 4. 10 mm right-sided thyroid nodule Consider further evaluation with thyroid ultrasound. If patient is clinically hyperthyroid, consider nuclear medicine thyroid uptake and scan. 5. Mild degenerative change of the cervical spine is centered at C3-4. 6. Atherosclerotic changes at the carotid arteries bilaterally. Electronically Signed   By: San Morelle M.D.   On: 02/21/2015 17:39   Mr Virgel Paling Wo Contrast  02/20/2015  CLINICAL DATA:  81 year old female with dysarthria and facial deficits suspicious for TIA. Intermittent left face and tongue numbness for 1 month. Initial encounter. EXAM: MRI HEAD WITHOUT CONTRAST MRA HEAD WITHOUT CONTRAST TECHNIQUE: Multiplanar, multiecho pulse sequences of the brain and surrounding structures were obtained without intravenous contrast. Angiographic images of the head were obtained using MRA technique without contrast. COMPARISON:  [facility] brain MRI report 02/21/2011 (no images available). FINDINGS: MRI HEAD FINDINGS No restricted diffusion or evidence of acute infarction. Major intracranial vascular flow voids are preserved. Cerebral volume is within normal limits for age. No midline shift, mass effect, evidence of mass lesion, ventriculomegaly, extra-axial collection or acute intracranial hemorrhage. Cervicomedullary junction and pituitary are within normal limits. CSF pulsation artifact suspected in the left subarachnoid space at the anterior insula (series 5 image 13), with no signal abnormality there on any other sequence. [place] Cables and white matter signal is within normal limits for age throughout the brain. No cortical encephalomalacia or definite chronic cerebral blood products. Visible internal auditory structures  appear normal. Mastoids are clear. Trace paranasal sinus mucosal thickening. Orbit and face soft tissues appear normal. There is a smooth polypoid soft tissue lesion in the midline of the nasopharynx measuring 13-14 mm diameter (sagittal image 11). This does demonstrate increased diffusion (series 100, image 4). It does not resemble typical adenoid hypertrophy or Tornwaldt cyst. Otherwise no skullbase abnormality identified. Normal stylomastoid foramina. Normal bone marrow signal. Negative visualized cervical spine. MRA HEAD FINDINGS Antegrade flow in the posterior circulation with codominant distal vertebral arteries. Normal PICA origins. Normal vertebrobasilar junction. No basilar stenosis. SCA and right PCA origins are normal. Posterior communicating arteries are diminutive or absent. The left PCA appears occluded just beyond its origin in the P1 segment. No distal reconstituted left PCA flow signal identified. Right PCA branches appear normal. Antegrade flow in both ICA siphons. There is irregularity of the distal cervical ICAs greater on  the left. The left ICA also appears to have the retropharyngeal course. Antegrade flow in both ICA siphons. No siphon stenosis. Ophthalmic artery origins are within normal limits. Bilateral carotid termini are patent. MCA and ACA origins are within normal limits. Anterior communicating artery and visualized ACA branches are within normal limits. Visualized right MCA branches are within normal limits. The left MCA M1 segment is within normal limits. There is an early left MCA bifurcation. There are high-grade stenoses in 2 posterior MCA M2 segment divisions with preserved distal flow. See 109 image 14. The anterior M2 branch and other left MCA branches are within normal limits. IMPRESSION: 1. No acute infarct and largely unremarkable for age noncontrast MRI appearance of the brain. 2. Left PCA occlusion, presumably chronic in light of no acute MRI findings in that vascular  territory. 3. 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 4. Polypoid up to 1.4 cm soft tissue lesion in the midline at the nasopharynx, not typical in appearance for Tornwaldt cyst, but appearance does favor benign etiology. Recommend ENT follow-up. Study discussed by telephone with Dr. Kenn File on 02/20/2015 at 1710 hours. Electronically Signed   By: Genevie Ann M.D.   On: 02/20/2015 17:14   Mr Brain Wo Contrast  02/21/2015  CLINICAL DATA:  81 year old female with right tongue numbness, difficulty swallowing and talking. On physical exam the right aspect over tongue appear swollen. Outpatient brain MRI and intracranial MRA yesterday negative for acute stroke, but positive for intracranial atherosclerosis. Subsequent encounter. EXAM: MRI HEAD WITHOUT CONTRAST TECHNIQUE: Multiplanar, multiecho pulse sequences of the brain and surrounding structures were obtained without intravenous contrast. COMPARISON:  Brain MRI and MRA 02/20/15. FINDINGS: Unchanged cerebral volume. Major intracranial vascular flow voids are stable. No restricted diffusion to suggest acute infarction. No midline shift, mass effect, evidence of mass lesion, ventriculomegaly, extra-axial collection or acute intracranial hemorrhage. Cervicomedullary junction and pituitary are within normal limits. Stable gray and white matter signal since yesterday. Negative visualized cervical spine. Normal bone marrow signal. Unchanged nasopharynx. Paranasal sinuses and mastoids remain clear. Negative orbit and scalp soft tissues. Today examination was performed such that the entire mouth and jaw is visible. There is abnormal T2 hyperintensity and enlargement in the right tongue (series 6, image 7). There is asymmetric T2 hyperintensity resembling a small volume of tracking fluid in the right parapharyngeal and submandibular spaces (series 6, image 3). There may be a small volume of right sublingual space fluid. The  mandible is intact with normal bone marrow signal. The submandibular glands are not definitely asymmetric. The cervical right ICA flow void is tortuous and difficult to trace throughout its course. There is associated mild effacement of the right lateral pharynx. No lymphadenopathy. IMPRESSION: 1. No acute infarct and stable noncontrast MRI appearance of the brain since yesterday. No skullbase abnormality identified. 2. However, there is T2 hyperintensity and enlargement in the right aspect of the tongue associated with inflammatory appearing signal changes in the right parapharyngeal, submandibular, and possibly also sublingual space. No lymphadenopathy or drainable fluid collection suggested. Recommend follow-up neck CT with IV contrast to evaluate further. Study discussed by telephone with Dr. Orlie Dakin on 02/21/2015 at 1600 hours. Electronically Signed   By: Genevie Ann M.D.   On: 02/21/2015 16:18   Mr Brain Wo Contrast  02/20/2015  CLINICAL DATA:  81 year old female with dysarthria and facial deficits suspicious for TIA. Intermittent left face and tongue numbness for 1 month. Initial encounter. EXAM: MRI HEAD  WITHOUT CONTRAST MRA HEAD WITHOUT CONTRAST TECHNIQUE: Multiplanar, multiecho pulse sequences of the brain and surrounding structures were obtained without intravenous contrast. Angiographic images of the head were obtained using MRA technique without contrast. COMPARISON:  [facility] brain MRI report 02/21/2011 (no images available). FINDINGS: MRI HEAD FINDINGS No restricted diffusion or evidence of acute infarction. Major intracranial vascular flow voids are preserved. Cerebral volume is within normal limits for age. No midline shift, mass effect, evidence of mass lesion, ventriculomegaly, extra-axial collection or acute intracranial hemorrhage. Cervicomedullary junction and pituitary are within normal limits. CSF pulsation artifact suspected in the left subarachnoid space at the  anterior insula (series 5 image 13), with no signal abnormality there on any other sequence. [place] Cables and white matter signal is within normal limits for age throughout the brain. No cortical encephalomalacia or definite chronic cerebral blood products. Visible internal auditory structures appear normal. Mastoids are clear. Trace paranasal sinus mucosal thickening. Orbit and face soft tissues appear normal. There is a smooth polypoid soft tissue lesion in the midline of the nasopharynx measuring 13-14 mm diameter (sagittal image 11). This does demonstrate increased diffusion (series 100, image 4). It does not resemble typical adenoid hypertrophy or Tornwaldt cyst. Otherwise no skullbase abnormality identified. Normal stylomastoid foramina. Normal bone marrow signal. Negative visualized cervical spine. MRA HEAD FINDINGS Antegrade flow in the posterior circulation with codominant distal vertebral arteries. Normal PICA origins. Normal vertebrobasilar junction. No basilar stenosis. SCA and right PCA origins are normal. Posterior communicating arteries are diminutive or absent. The left PCA appears occluded just beyond its origin in the P1 segment. No distal reconstituted left PCA flow signal identified. Right PCA branches appear normal. Antegrade flow in both ICA siphons. There is irregularity of the distal cervical ICAs greater on the left. The left ICA also appears to have the retropharyngeal course. Antegrade flow in both ICA siphons. No siphon stenosis. Ophthalmic artery origins are within normal limits. Bilateral carotid termini are patent. MCA and ACA origins are within normal limits. Anterior communicating artery and visualized ACA branches are within normal limits. Visualized right MCA branches are within normal limits. The left MCA M1 segment is within normal limits. There is an early left MCA bifurcation. There are high-grade stenoses in 2 posterior MCA M2 segment divisions with preserved distal flow. See 109  image 14. The anterior M2 branch and other left MCA branches are within normal limits. IMPRESSION: 1. No acute infarct and largely unremarkable for age noncontrast MRI appearance of the brain. 2. Left PCA occlusion, presumably chronic in light of no acute MRI findings in that vascular territory. 3. 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 4. Polypoid up to 1.4 cm soft tissue lesion in the midline at the nasopharynx, not typical in appearance for Tornwaldt cyst, but appearance does favor benign etiology. Recommend ENT follow-up. Study discussed by telephone with Dr. Kenn File on 02/20/2015 at 1710 hours. Electronically Signed   By: Genevie Ann M.D.   On: 02/20/2015 17:14     Assessment/Plan Principal Problem:   Submandibular swelling Active Problems:   Thyroid nodule   Elevated blood pressure   1. Right-sided tongue swelling and submandibular swelling - cause not clear. At this time patient is being empirically placed on clindamycin for any infection and Solu-Medrol and Pepcid for any allergic reaction with when necessary Benadryl. ENT surgeon Dr. Benjamine Mola will be seeing patient in consult for laryngoscopy. Patient will be kept nothing by mouth. 2. Thyroid nodule -  will need further workup as outpatient and this was explained to patient's son. Check thyroid function tests since patient has history of palpitations. 3. Elevated blood pressure - during recent cardiology visit patient was noticed to have elevated blood pressure. At this time I have placed patient on when necessary IV hydralazine.  I have reviewed patient's old charts labs.   DVT Prophylaxis SCDs.  Code Status: Full code.  Family Communication: Discussed with patient's son.  Disposition Plan: Admit to inpatient.    Londyn Wotton N. [facility] Pager [phone].  If 7PM-7AM, please contact night-coverage [email or web address] Password [email or web address] 02/21/2015, 11:13 PM

## 2015-02-22 DIAGNOSIS — R221 Localized swelling, mass and lump, neck: Secondary | ICD-10-CM

## 2015-02-22 DIAGNOSIS — D3705 Neoplasm of uncertain behavior of pharynx: Secondary | ICD-10-CM | POA: Diagnosis not present

## 2015-02-22 DIAGNOSIS — E041 Nontoxic single thyroid nodule: Secondary | ICD-10-CM

## 2015-02-22 DIAGNOSIS — R030 Elevated blood-pressure reading, without diagnosis of hypertension: Secondary | ICD-10-CM

## 2015-02-22 DIAGNOSIS — R220 Localized swelling, mass and lump, head: Secondary | ICD-10-CM

## 2015-02-22 DIAGNOSIS — S01512A Laceration without foreign body of oral cavity, initial encounter: Secondary | ICD-10-CM

## 2015-02-22 LAB — GLUCOSE, CAPILLARY
GLUCOSE-CAPILLARY: 145 mg/dL — AB (ref 65–99)
GLUCOSE-CAPILLARY: 154 mg/dL — AB (ref 65–99)
Glucose-Capillary: 127 mg/dL — ABNORMAL HIGH (ref 65–99)

## 2015-02-22 LAB — CBC WITH DIFFERENTIAL/PLATELET
BASOS PCT: 0 %
Basophils Absolute: 0 10*3/uL (ref 0.0–0.1)
Eosinophils Absolute: 0 10*3/uL (ref 0.0–0.7)
Eosinophils Relative: 0 %
HEMATOCRIT: 39.4 % (ref 36.0–46.0)
HEMOGLOBIN: 13.4 g/dL (ref 12.0–15.0)
LYMPHS PCT: 11 %
Lymphs Abs: 0.8 10*3/uL (ref 0.7–4.0)
MCH: 30.2 pg (ref 26.0–34.0)
MCHC: 34 g/dL (ref 30.0–36.0)
MCV: 88.7 fL (ref 78.0–100.0)
MONOS PCT: 1 %
Monocytes Absolute: 0 10*3/uL — ABNORMAL LOW (ref 0.1–1.0)
NEUTROS ABS: 6.5 10*3/uL (ref 1.7–7.7)
NEUTROS PCT: 88 %
Platelets: 301 10*3/uL (ref 150–400)
RBC: 4.44 MIL/uL (ref 3.87–5.11)
RDW: 13.4 % (ref 11.5–15.5)
WBC: 7.3 10*3/uL (ref 4.0–10.5)

## 2015-02-22 LAB — COMPREHENSIVE METABOLIC PANEL
ALT: 14 U/L (ref 14–54)
AST: 23 U/L (ref 15–41)
Albumin: 3.3 g/dL — ABNORMAL LOW (ref 3.5–5.0)
Alkaline Phosphatase: 82 U/L (ref 38–126)
Anion gap: 10 (ref 5–15)
BILIRUBIN TOTAL: 0.6 mg/dL (ref 0.3–1.2)
BUN: 12 mg/dL (ref 6–20)
CO2: 21 mmol/L — ABNORMAL LOW (ref 22–32)
CREATININE: 0.86 mg/dL (ref 0.44–1.00)
Calcium: 9.1 mg/dL (ref 8.9–10.3)
Chloride: 108 mmol/L (ref 101–111)
Glucose, Bld: 147 mg/dL — ABNORMAL HIGH (ref 65–99)
POTASSIUM: 4.1 mmol/L (ref 3.5–5.1)
Sodium: 139 mmol/L (ref 135–145)
TOTAL PROTEIN: 6.9 g/dL (ref 6.5–8.1)

## 2015-02-22 LAB — MRSA PCR SCREENING: MRSA by PCR: NEGATIVE

## 2015-02-22 LAB — T4, FREE: Free T4: 0.94 ng/dL (ref 0.61–1.12)

## 2015-02-22 LAB — CULTURE, GROUP A STREP: Strep A Culture: NEGATIVE

## 2015-02-22 MED ORDER — CLINDAMYCIN HCL 300 MG PO CAPS: 300.0000 mg | ORAL_CAPSULE | Freq: Four times a day (QID) | ORAL | Status: DC

## 2015-02-22 MED ORDER — PREDNISONE 10 MG PO TABS: 10.0000 mg | ORAL_TABLET | Freq: Every day | ORAL | Status: DC

## 2015-02-22 NOTE — Discharge Summary (Signed)
Physician Discharge Summary  [name] [facility]:[id number] DOB: [birth year] DOA: 02/21/2015  PCP: Evelina Dun, FNP  Admit date: 02/21/2015 Discharge date: 02/22/2015  Time spent: 40 minutes  Recommendations for Outpatient Follow-up:  Right-sided tongue swelling and submandibular swelling  - cause not clear.  -Continue antibiotics and steroids per Dr.Su Benjamine Mola (Otolaryngology. Patient follow-up in 7 days  -Scheduled appointment for 2 weeks postdischarge for FNP Christy A Hawks submandibular infection, thyroid nodule -Clindamycin 300 mg QID 7 days -Prednisone 10 mg daily 7 days  Thyroid nodule  - will need further workup as outpatient and this was explained to patient's son.  -Check thyroid function tests since patient has history of palpitations. Free T4 WNL, will need free T3 and TSH as outpatient. -See right sided tongue swelling  Elevated blood pressure - during recent cardiology visit patient was noticed to have elevated blood pressure.  -Lisinopril 20 mg daily  -Follow-up with PCP for titration of medication/additional medication    Discharge Diagnoses:  Principal Problem:   Submandibular swelling Active Problems:   Thyroid nodule   Elevated blood pressure   Laceration of tongue   Discharge Condition: Stable   Diet recommendation: Soft diet  Filed Weights   02/21/15 2132 02/22/15 0419  Weight: 67.1 kg (147 lb 14.9 oz) 69.1 kg (152 lb 5.4 oz)    History of present illness:  81 y.o. HF (non-English-speaking), PMHx no significant past medical history   Recently to cardiology office for palpitations was brought to the ER patient was having persistent difficulty talking and numbness of the right side of the tongue. Patient's symptoms started 10 days ago when patient had swelling and numbness of the tongue which resolved without any intervention. Patient's symptoms again recurred 3 days ago and was taken to the ER where patient was ruled out of stroke with MRI brain.  Since patient's symptoms worsened today morning patient was brought to the ER. Again MRI was done which was negative for any stroke. On exam patient has a right tongue swelling and CT of the neck soft tissue was done which shows swelling around the right side of the tongue and submandibular areas. On-call ENT surgeon Dr. Benjamine Mola has been consulted and patient was started on Solu-Medrol and clindamycin and admitted for further management. Patient still has mild pain around the area but denies any difficulty swallowing. Patient's pain increases on swallowing. Denies any recent use of any medications. Denies any insect bites.  During his hospitalization patient was treated for infection of submandibular gland and right-sided tongue swelling. ENT recommended patient continue 7 more days of antibiotics and steroids, and then follow-up as outpatient. Patient was also advised of thyroid nodule which will need to be evaluated as an outpatient.   Procedures: Dr.Su Benjamine Mola (OtolaryngologyENT)  Antibiotics Clindamycin 11/2>>    Discharge Exam: Filed Vitals:   02/22/15 0017 02/22/15 0419 02/22/15 0730 02/22/15 1200  BP: 121/50 119/51 132/62   Pulse: 65 74    Temp: 98 F (36.7 C) 98.5 F (36.9 C) 97.9 F (36.6 C) 98 F (36.7 C)  TempSrc: Oral Oral Oral Oral  Resp: 25 20    Height:  5\' 4"  (1.626 m)    Weight:  69.1 kg (152 lb 5.4 oz)    SpO2: 94% 99%      General: A/O 4, mild right jaw pain with palpation (per patient significantly decreased from admission), laceration on the right lateral aspect of tongue with some swelling Cardiovascular: Regular rhythm and rate, negative murmurs rubs or gallops, normal  S1/S2 Respiratory: Clear to auscultation bilateral  Discharge Instructions     Medication List    TAKE these medications        ADVIL 200 MG Caps  Generic drug:  Ibuprofen  Take 200 mg by mouth as needed (pain).     aspirin EC 81 MG tablet  Take 81 mg by mouth daily.     clindamycin 300  MG capsule  Commonly known as:  CLEOCIN  Take 1 capsule (300 mg total) by mouth 4 (four) times daily.     fluticasone 50 MCG/ACT nasal spray  Commonly known as:  FLONASE  Place 2 sprays into both nostrils daily.     lisinopril 20 MG tablet  Commonly known as:  PRINIVIL,ZESTRIL  Take 1 tablet (20 mg total) by mouth daily.     predniSONE 10 MG tablet  Commonly known as:  DELTASONE  Take 1 tablet (10 mg total) by mouth daily with breakfast.     traZODone 50 MG tablet  Commonly known as:  DESYREL  Take 0.5 tablets (25 mg total) by mouth at bedtime as needed for sleep. toma una mitad de una pastilla por la noche     VOLTAREN 1 % Gel  Generic drug:  diclofenac sodium  Apply to R ankle 3 to 4 times daily as needed for pain       No Known Allergies Follow-up Information    Follow up with Evelina Dun, FNP.   Specialty:  Family Medicine   Why:  Scheduled appointment for 2 weeks postdischarge for FNP Christy A Hawks submandibular infection, thyroid nodule   Contact information:   [place] [address] [phone]       Follow up with Ascencion Dike, MD In 1 week.   Specialty:  Otolaryngology   Why:   Scheduled follow-up with Dr.Su Benjamine Mola (Otolaryngology. Patient follow-up in 7 days    Contact information:   [address] [place] [address] [phone]        The results of significant diagnostics from this hospitalization (including imaging, microbiology, ancillary and laboratory) are listed below for reference.    Significant Diagnostic Studies: Ct Soft Tissue Neck W Contrast  02/21/2015  CLINICAL DATA:  Swelling in the right side of the tongue. Difficulty swallowing. Left-sided facial numbness. EXAM: CT NECK WITH CONTRAST TECHNIQUE: Multidetector CT imaging of the neck was performed using the standard protocol following the bolus administration of intravenous contrast. CONTRAST:  31mL OMNIPAQUE IOHEXOL 300 MG/ML  SOLN COMPARISON:  MRI brain  02/21/2015 and MRI brain 02/20/2015. FINDINGS: Pharynx and larynx: Edematous changes are present at the right base of tongue extending into the vallecula. It is unclear whether this represents focal mucosal edema or a mass lesion at the right base of tongue. The asymmetric enhancement appears to extend into the vallecula. There is asymmetric lateral deviation of the right true vocal cord. Salivary glands: Marked inflammatory changes are noted alignment the medial and superior aspect of the right submandibular gland. This extends into the right side of the tongue. Edematous changes are noted into the anterior aspect of the right-sided the tongue. There is some stranding in the right parapharyngeal fat compared to the left. No definite duct obstruction is present although there is some inflammatory change about the submandibular duct. Benign appearing lymph nodes are evident within the parotid glands. The left submandibular gland is within normal limits. Thyroid: There is mild asymmetry of the thyroid. A 10 mm nodule is present  on the right. Lymph nodes: An 11 mm somewhat rounded posterior left level 2 lymph node is present. A 10 mm somewhat rounded right level 2 lymph nodes present. These are likely reactive. No definite pathologic nodes are seen. Vascular: Atherosclerotic calcifications are present at both carotid bifurcations without significant stenoses relative to the more distal vessel. Limited intracranial: The visualized posterior fossa is unremarkable. Visualized orbits: Not visualize Mastoids and visualized paranasal sinuses: Clear Skeleton: Template degenerative change in uncovertebral disease is most severe at C3-4. This is worse left than right. Vertebral body heights are normal. There is slight anterolisthesis at C6-7. Upper chest: Interstitial thickening is more prominent right than left. This may reflect some edema. IMPRESSION: 1. Edematous changes at the right base of tongue, right submandibular gland  and extending into the anterior tongue which was visualize static MRI. This appears to be inflammatory process may be related to inflammation within the right submandibular duct or right-sided cell adenitis. No discrete abscess is present. 2. Bilateral borderline level 2 lymph nodes are likely reactive. 3. Asymmetric enhancement and thickening of the mucosa at the right base of tongue extending into the vallecular. Recommend direct evaluation of this area for possible neoplasm. This most likely represents inflammatory change. 4. 10 mm right-sided thyroid nodule Consider further evaluation with thyroid ultrasound. If patient is clinically hyperthyroid, consider nuclear medicine thyroid uptake and scan. 5. Mild degenerative change of the cervical spine is centered at C3-4. 6. Atherosclerotic changes at the carotid arteries bilaterally. Electronically Signed   By: San Morelle M.D.   On: 02/21/2015 17:39   Mr Virgel Paling Wo Contrast  02/20/2015  CLINICAL DATA:  81 year old female with dysarthria and facial deficits suspicious for TIA. Intermittent left face and tongue numbness for 1 month. Initial encounter. EXAM: MRI HEAD WITHOUT CONTRAST MRA HEAD WITHOUT CONTRAST TECHNIQUE: Multiplanar, multiecho pulse sequences of the brain and surrounding structures were obtained without intravenous contrast. Angiographic images of the head were obtained using MRA technique without contrast. COMPARISON:  [facility] brain MRI report 02/21/2011 (no images available). FINDINGS: MRI HEAD FINDINGS No restricted diffusion or evidence of acute infarction. Major intracranial vascular flow voids are preserved. Cerebral volume is within normal limits for age. No midline shift, mass effect, evidence of mass lesion, ventriculomegaly, extra-axial collection or acute intracranial hemorrhage. Cervicomedullary junction and pituitary are within normal limits. CSF pulsation artifact suspected in the left subarachnoid space at  the anterior insula (series 5 image 13), with no signal abnormality there on any other sequence. [place] Cables and white matter signal is within normal limits for age throughout the brain. No cortical encephalomalacia or definite chronic cerebral blood products. Visible internal auditory structures appear normal. Mastoids are clear. Trace paranasal sinus mucosal thickening. Orbit and face soft tissues appear normal. There is a smooth polypoid soft tissue lesion in the midline of the nasopharynx measuring 13-14 mm diameter (sagittal image 11). This does demonstrate increased diffusion (series 100, image 4). It does not resemble typical adenoid hypertrophy or Tornwaldt cyst. Otherwise no skullbase abnormality identified. Normal stylomastoid foramina. Normal bone marrow signal. Negative visualized cervical spine. MRA HEAD FINDINGS Antegrade flow in the posterior circulation with codominant distal vertebral arteries. Normal PICA origins. Normal vertebrobasilar junction. No basilar stenosis. SCA and right PCA origins are normal. Posterior communicating arteries are diminutive or absent. The left PCA appears occluded just beyond its origin in the P1 segment. No distal reconstituted left PCA flow signal identified. Right PCA branches appear normal. Antegrade flow in both ICA siphons.  There is irregularity of the distal cervical ICAs greater on the left. The left ICA also appears to have the retropharyngeal course. Antegrade flow in both ICA siphons. No siphon stenosis. Ophthalmic artery origins are within normal limits. Bilateral carotid termini are patent. MCA and ACA origins are within normal limits. Anterior communicating artery and visualized ACA branches are within normal limits. Visualized right MCA branches are within normal limits. The left MCA M1 segment is within normal limits. There is an early left MCA bifurcation. There are high-grade stenoses in 2 posterior MCA M2 segment divisions with preserved distal flow. See 109  image 14. The anterior M2 branch and other left MCA branches are within normal limits. IMPRESSION: 1. No acute infarct and largely unremarkable for age noncontrast MRI appearance of the brain. 2. Left PCA occlusion, presumably chronic in light of no acute MRI findings in that vascular territory. 3. 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 4. Polypoid up to 1.4 cm soft tissue lesion in the midline at the nasopharynx, not typical in appearance for Tornwaldt cyst, but appearance does favor benign etiology. Recommend ENT follow-up. Study discussed by telephone with Dr. Kenn File on 02/20/2015 at 1710 hours. Electronically Signed   By: Genevie Ann M.D.   On: 02/20/2015 17:14   Mr Brain Wo Contrast  02/21/2015  CLINICAL DATA:  81 year old female with right tongue numbness, difficulty swallowing and talking. On physical exam the right aspect over tongue appear swollen. Outpatient brain MRI and intracranial MRA yesterday negative for acute stroke, but positive for intracranial atherosclerosis. Subsequent encounter. EXAM: MRI HEAD WITHOUT CONTRAST TECHNIQUE: Multiplanar, multiecho pulse sequences of the brain and surrounding structures were obtained without intravenous contrast. COMPARISON:  Brain MRI and MRA 02/20/15. FINDINGS: Unchanged cerebral volume. Major intracranial vascular flow voids are stable. No restricted diffusion to suggest acute infarction. No midline shift, mass effect, evidence of mass lesion, ventriculomegaly, extra-axial collection or acute intracranial hemorrhage. Cervicomedullary junction and pituitary are within normal limits. Stable gray and white matter signal since yesterday. Negative visualized cervical spine. Normal bone marrow signal. Unchanged nasopharynx. Paranasal sinuses and mastoids remain clear. Negative orbit and scalp soft tissues. Today examination was performed such that the entire mouth and jaw is visible. There is abnormal T2  hyperintensity and enlargement in the right tongue (series 6, image 7). There is asymmetric T2 hyperintensity resembling a small volume of tracking fluid in the right parapharyngeal and submandibular spaces (series 6, image 3). There may be a small volume of right sublingual space fluid. The mandible is intact with normal bone marrow signal. The submandibular glands are not definitely asymmetric. The cervical right ICA flow void is tortuous and difficult to trace throughout its course. There is associated mild effacement of the right lateral pharynx. No lymphadenopathy. IMPRESSION: 1. No acute infarct and stable noncontrast MRI appearance of the brain since yesterday. No skullbase abnormality identified. 2. However, there is T2 hyperintensity and enlargement in the right aspect of the tongue associated with inflammatory appearing signal changes in the right parapharyngeal, submandibular, and possibly also sublingual space. No lymphadenopathy or drainable fluid collection suggested. Recommend follow-up neck CT with IV contrast to evaluate further. Study discussed by telephone with Dr. Orlie Dakin on 02/21/2015 at 1600 hours. Electronically Signed   By: Genevie Ann M.D.   On: 02/21/2015 16:18   Mr Brain Wo Contrast  02/20/2015  CLINICAL DATA:  81 year old female with dysarthria and facial deficits suspicious for TIA. Intermittent left face and  tongue numbness for 1 month. Initial encounter. EXAM: MRI HEAD WITHOUT CONTRAST MRA HEAD WITHOUT CONTRAST TECHNIQUE: Multiplanar, multiecho pulse sequences of the brain and surrounding structures were obtained without intravenous contrast. Angiographic images of the head were obtained using MRA technique without contrast. COMPARISON:  [facility] brain MRI report 02/21/2011 (no images available). FINDINGS: MRI HEAD FINDINGS No restricted diffusion or evidence of acute infarction. Major intracranial vascular flow voids are preserved. Cerebral volume is within  normal limits for age. No midline shift, mass effect, evidence of mass lesion, ventriculomegaly, extra-axial collection or acute intracranial hemorrhage. Cervicomedullary junction and pituitary are within normal limits. CSF pulsation artifact suspected in the left subarachnoid space at the anterior insula (series 5 image 13), with no signal abnormality there on any other sequence. [place] Cables and white matter signal is within normal limits for age throughout the brain. No cortical encephalomalacia or definite chronic cerebral blood products. Visible internal auditory structures appear normal. Mastoids are clear. Trace paranasal sinus mucosal thickening. Orbit and face soft tissues appear normal. There is a smooth polypoid soft tissue lesion in the midline of the nasopharynx measuring 13-14 mm diameter (sagittal image 11). This does demonstrate increased diffusion (series 100, image 4). It does not resemble typical adenoid hypertrophy or Tornwaldt cyst. Otherwise no skullbase abnormality identified. Normal stylomastoid foramina. Normal bone marrow signal. Negative visualized cervical spine. MRA HEAD FINDINGS Antegrade flow in the posterior circulation with codominant distal vertebral arteries. Normal PICA origins. Normal vertebrobasilar junction. No basilar stenosis. SCA and right PCA origins are normal. Posterior communicating arteries are diminutive or absent. The left PCA appears occluded just beyond its origin in the P1 segment. No distal reconstituted left PCA flow signal identified. Right PCA branches appear normal. Antegrade flow in both ICA siphons. There is irregularity of the distal cervical ICAs greater on the left. The left ICA also appears to have the retropharyngeal course. Antegrade flow in both ICA siphons. No siphon stenosis. Ophthalmic artery origins are within normal limits. Bilateral carotid termini are patent. MCA and ACA origins are within normal limits. Anterior communicating artery and visualized ACA  branches are within normal limits. Visualized right MCA branches are within normal limits. The left MCA M1 segment is within normal limits. There is an early left MCA bifurcation. There are high-grade stenoses in 2 posterior MCA M2 segment divisions with preserved distal flow. See 109 image 14. The anterior M2 branch and other left MCA branches are within normal limits. IMPRESSION: 1. No acute infarct and largely unremarkable for age noncontrast MRI appearance of the brain. 2. Left PCA occlusion, presumably chronic in light of no acute MRI findings in that vascular territory. 3. 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 4. Polypoid up to 1.4 cm soft tissue lesion in the midline at the nasopharynx, not typical in appearance for Tornwaldt cyst, but appearance does favor benign etiology. Recommend ENT follow-up. Study discussed by telephone with Dr. Kenn File on 02/20/2015 at 1710 hours. Electronically Signed   By: Genevie Ann M.D.   On: 02/20/2015 17:14    Microbiology: Recent Results (from the past 240 hour(s))  Culture, Group A Strep     Status: None   Collection Time: 02/19/15  6:09 PM  Result Value Ref Range Status   Strep A Culture Negative  Final  MRSA PCR Screening     Status: None   Collection Time: 02/22/15 12:37 AM  Result Value Ref Range Status   MRSA by PCR NEGATIVE  NEGATIVE Final    Comment:        The GeneXpert MRSA Assay (FDA approved for NASAL specimens only), is one component of a comprehensive MRSA colonization surveillance program. It is not intended to diagnose MRSA infection nor to guide or monitor treatment for MRSA infections.      Labs: Basic Metabolic Panel:  Recent Labs Lab 02/19/15 1843 02/21/15 1245 02/21/15 1307 02/22/15 0438  NA 136 140 141 139  K 4.5 4.1 4.1 4.1  CL 95* 104 105 108  CO2 22 24  --  21*  GLUCOSE 63* 95 95 147*  BUN 16 11 13 12   CREATININE 0.78 0.80 0.70 0.86  CALCIUM 9.3 9.5  --  9.1    Liver Function Tests:  Recent Labs Lab 02/19/15 1843 02/21/15 1245 02/22/15 0438  AST 22 26 23   ALT 14 16 14   ALKPHOS 102 92 82  BILITOT 0.4 0.8 0.6  PROT 7.1 7.4 6.9  ALBUMIN 4.2 3.9 3.3*   No results for input(s): LIPASE, AMYLASE in the last 168 hours. No results for input(s): AMMONIA in the last 168 hours. CBC:  Recent Labs Lab 02/19/15 1843 02/21/15 1245 02/21/15 1307 02/22/15 0438  WBC 10.6 10.2  --  7.3  NEUTROABS  --  7.5  --  6.5  HGB  --  13.7 15.6* 13.4  HCT 42.0 42.3 46.0 39.4  MCV  --  88.9  --  88.7  PLT  --  304  --  301   Cardiac Enzymes: No results for input(s): CKTOTAL, CKMB, CKMBINDEX, TROPONINI in the last 168 hours. BNP: BNP (last 3 results) No results for input(s): BNP in the last 8760 hours.  ProBNP (last 3 results) No results for input(s): PROBNP in the last 8760 hours.  CBG:  Recent Labs Lab 02/21/15 1238 02/22/15 0015 02/22/15 0417 02/22/15 1111  GLUCAP 83 154* 145* 127*       Signed:  Dia Crawford, MD [facility] [phone] pager

## 2015-02-22 NOTE — Consult Note (Signed)
Reason for Consult: Tongue swelling, difficulty talking Referring Physician: Forde Dandy, MD  HPI:  [name] is an 81 y.o. female female who was brought to the ER yesterday after having persistent difficulty talking and numbness/swelling of the right side of her tongue. Patient's symptoms started 10 days ago when patient had swelling and numbness of the tongue which resolved without any intervention. Patient's symptoms again recurred 3 days ago and was taken to the ER where patient was ruled out of stroke with MRI brain. Since patient's symptoms worsened again yesterday, she was brought to the ER. Again MRI was done which was negative for any stroke. On exam patient has a right tongue swelling and CT of the neck soft tissue was done which shows swelling around the right side of the tongue and submandibular areas. She was started on Solu-Medrol and clindamycin and admitted for further observation. Patient denies any difficulty swallowing. Patient's pain increases on swallowing. Denies any recent use of any medications. Denies any insect bites. Since admission, the patient's symptoms have significantly improved. The patient reports no difficulty with her speech this morning. She also denies any dysphagia or odynophagia at this time.   Past Medical History  Diagnosis Date  . Unsteady gait   . Arthritis of right foot   . Palpitations   . Hx: UTI (urinary tract infection)     Past Surgical History  Procedure Laterality Date  . None      Family History  Problem Relation Age of Onset  . Heart attack Mother 9    Died in [place]    Social History:  reports that she has never smoked. She does not have any smokeless tobacco history on file. She reports that she does not drink alcohol or use illicit drugs.  Allergies: No Known Allergies  Prior to Admission medications   Medication Sig Start Date End Date Taking? Authorizing Provider  aspirin EC 81 MG tablet Take 81 mg by mouth daily.   Yes  Historical Provider, MD  fluticasone (FLONASE) 50 MCG/ACT nasal spray Place 2 sprays into both nostrils daily. 02/19/15  Yes Timmothy Euler, MD  Ibuprofen (ADVIL) 200 MG CAPS Take 200 mg by mouth as needed (pain).    Yes Historical Provider, MD  lisinopril (PRINIVIL,ZESTRIL) 20 MG tablet Take 1 tablet (20 mg total) by mouth daily. Patient not taking: Reported on 02/21/2015 02/19/15   Timmothy Euler, MD  traZODone (DESYREL) 50 MG tablet Take 0.5 tablets (25 mg total) by mouth at bedtime as needed for sleep. toma una mitad de una pastilla por la noche Patient not taking: Reported on 02/19/2015 01/05/15   Fransisca Kaufmann Dettinger, MD  VOLTAREN 1 % GEL Apply to R ankle 3 to 4 times daily as needed for pain Patient not taking: Reported on 02/21/2015 02/19/15 02/19/16  Timmothy Euler, MD    Medications:  I have reviewed the patient's current medications. Scheduled: . clindamycin (CLEOCIN) IV  600 mg Intravenous 3 times per day  . famotidine (PEPCID) IV  20 mg Intravenous Q12H  . fluticasone  2 spray Each Nare Daily  . methylPREDNISolone (SOLU-MEDROL) injection  40 mg Intravenous Q12H   GSU:PJSRPRXYVOPFY **OR** acetaminophen, diphenhydrAMINE, ondansetron **OR** ondansetron (ZOFRAN) IV  Results for orders placed or performed during the hospital encounter of 02/21/15 (from the past 48 hour(s))  CBG monitoring, ED     Status: None   Collection Time: 02/21/15 12:38 PM  Result Value Ref Range   Glucose-Capillary 83 65 - 99  mg/dL  Ethanol     Status: None   Collection Time: 02/21/15 12:45 PM  Result Value Ref Range   Alcohol, Ethyl (B) <5 <5 mg/dL    Comment:        LOWEST DETECTABLE LIMIT FOR SERUM ALCOHOL IS 5 mg/dL FOR MEDICAL PURPOSES ONLY   Protime-INR     Status: None   Collection Time: 02/21/15 12:45 PM  Result Value Ref Range   Prothrombin Time 14.2 11.6 - 15.2 seconds   INR 1.08 0.00 - 1.49  APTT     Status: None   Collection Time: 02/21/15 12:45 PM  Result Value Ref Range    aPTT 30 24 - 37 seconds  CBC     Status: None   Collection Time: 02/21/15 12:45 PM  Result Value Ref Range   WBC 10.2 4.0 - 10.5 K/uL   RBC 4.76 3.87 - 5.11 MIL/uL   Hemoglobin 13.7 12.0 - 15.0 g/dL   HCT 42.3 36.0 - 46.0 %   MCV 88.9 78.0 - 100.0 fL   MCH 28.8 26.0 - 34.0 pg   MCHC 32.4 30.0 - 36.0 g/dL   RDW 13.5 11.5 - 15.5 %   Platelets 304 150 - 400 K/uL  Differential     Status: None   Collection Time: 02/21/15 12:45 PM  Result Value Ref Range   Neutrophils Relative % 73 %   Neutro Abs 7.5 1.7 - 7.7 K/uL   Lymphocytes Relative 19 %   Lymphs Abs 1.9 0.7 - 4.0 K/uL   Monocytes Relative 6 %   Monocytes Absolute 0.6 0.1 - 1.0 K/uL   Eosinophils Relative 2 %   Eosinophils Absolute 0.2 0.0 - 0.7 K/uL   Basophils Relative 0 %   Basophils Absolute 0.0 0.0 - 0.1 K/uL  Comprehensive metabolic panel     Status: None   Collection Time: 02/21/15 12:45 PM  Result Value Ref Range   Sodium 140 135 - 145 mmol/L   Potassium 4.1 3.5 - 5.1 mmol/L   Chloride 104 101 - 111 mmol/L   CO2 24 22 - 32 mmol/L   Glucose, Bld 95 65 - 99 mg/dL   BUN 11 6 - 20 mg/dL   Creatinine, Ser 0.80 0.44 - 1.00 mg/dL   Calcium 9.5 8.9 - 10.3 mg/dL   Total Protein 7.4 6.5 - 8.1 g/dL   Albumin 3.9 3.5 - 5.0 g/dL   AST 26 15 - 41 U/L   ALT 16 14 - 54 U/L   Alkaline Phosphatase 92 38 - 126 U/L   Total Bilirubin 0.8 0.3 - 1.2 mg/dL   GFR calc non Af Amer >60 >60 mL/min   GFR calc Af Amer >60 >60 mL/min    Comment: (NOTE) The eGFR has been calculated using the CKD EPI equation. This calculation has not been validated in all clinical situations. eGFR's persistently <60 mL/min signify possible Chronic Kidney Disease.    Anion gap 12 5 - 15  I-stat troponin, ED (not at [facility])     Status: None   Collection Time: 02/21/15  1:05 PM  Result Value Ref Range   Troponin i, poc 0.00 0.00 - 0.08 ng/mL   Comment 3            Comment: Due to the release kinetics of cTnI, a negative result within the first  hours of the onset of symptoms does not rule out myocardial infarction with certainty. If myocardial infarction is still suspected, repeat the test at appropriate  intervals.   I-Stat Chem 8, ED  (not at [facility])     Status: Abnormal   Collection Time: 02/21/15  1:07 PM  Result Value Ref Range   Sodium 141 135 - 145 mmol/L   Potassium 4.1 3.5 - 5.1 mmol/L   Chloride 105 101 - 111 mmol/L   BUN 13 6 - 20 mg/dL   Creatinine, Ser 0.70 0.44 - 1.00 mg/dL   Glucose, Bld 95 65 - 99 mg/dL   Calcium, Ion 1.15 1.13 - 1.30 mmol/L   TCO2 23 0 - 100 mmol/L   Hemoglobin 15.6 (H) 12.0 - 15.0 g/dL   HCT 46.0 36.0 - 46.0 %  Urine rapid drug screen (hosp performed)not at [facility]     Status: None   Collection Time: 02/21/15  4:40 PM  Result Value Ref Range   Opiates NONE DETECTED NONE DETECTED   Cocaine NONE DETECTED NONE DETECTED   Benzodiazepines NONE DETECTED NONE DETECTED   Amphetamines NONE DETECTED NONE DETECTED   Tetrahydrocannabinol NONE DETECTED NONE DETECTED   Barbiturates NONE DETECTED NONE DETECTED    Comment:        DRUG SCREEN FOR MEDICAL PURPOSES ONLY.  IF CONFIRMATION IS NEEDED FOR ANY PURPOSE, NOTIFY LAB WITHIN 5 DAYS.        LOWEST DETECTABLE LIMITS FOR URINE DRUG SCREEN Drug Class       Cutoff (ng/mL) Amphetamine      1000 Barbiturate      200 Benzodiazepine   914 Tricyclics       782 Opiates          300 Cocaine          300 THC              50   Urinalysis, Routine w reflex microscopic (not at [facility])     Status: Abnormal   Collection Time: 02/21/15  4:40 PM  Result Value Ref Range   Color, Urine YELLOW YELLOW   APPearance CLOUDY (A) CLEAR   Specific Gravity, Urine 1.022 1.005 - 1.030   pH 6.0 5.0 - 8.0   Glucose, UA NEGATIVE NEGATIVE mg/dL   Hgb urine dipstick SMALL (A) NEGATIVE   Bilirubin Urine NEGATIVE NEGATIVE   Ketones, ur NEGATIVE NEGATIVE mg/dL   Protein, ur NEGATIVE NEGATIVE mg/dL   Urobilinogen, UA 1.0 0.0 - 1.0 mg/dL   Nitrite NEGATIVE NEGATIVE    Leukocytes, UA SMALL (A) NEGATIVE  Urine microscopic-add on     Status: None   Collection Time: 02/21/15  4:40 PM  Result Value Ref Range   Squamous Epithelial / LPF RARE RARE   WBC, UA 11-20 <3 WBC/hpf   RBC / HPF 0-2 <3 RBC/hpf   Bacteria, UA RARE RARE   Urine-Other MUCOUS PRESENT   Glucose, capillary     Status: Abnormal   Collection Time: 02/22/15 12:15 AM  Result Value Ref Range   Glucose-Capillary 154 (H) 65 - 99 mg/dL  MRSA PCR Screening     Status: None   Collection Time: 02/22/15 12:37 AM  Result Value Ref Range   MRSA by PCR NEGATIVE NEGATIVE    Comment:        The GeneXpert MRSA Assay (FDA approved for NASAL specimens only), is one component of a comprehensive MRSA colonization surveillance program. It is not intended to diagnose MRSA infection nor to guide or monitor treatment for MRSA infections.   Glucose, capillary     Status: Abnormal   Collection Time: 02/22/15  4:17 AM  Result Value Ref Range   Glucose-Capillary 145 (H) 65 - 99 mg/dL  T4, free     Status: None   Collection Time: 02/22/15  4:38 AM  Result Value Ref Range   Free T4 0.94 0.61 - 1.12 ng/dL  Comprehensive metabolic panel     Status: Abnormal   Collection Time: 02/22/15  4:38 AM  Result Value Ref Range   Sodium 139 135 - 145 mmol/L   Potassium 4.1 3.5 - 5.1 mmol/L   Chloride 108 101 - 111 mmol/L   CO2 21 (L) 22 - 32 mmol/L   Glucose, Bld 147 (H) 65 - 99 mg/dL   BUN 12 6 - 20 mg/dL   Creatinine, Ser 0.86 0.44 - 1.00 mg/dL   Calcium 9.1 8.9 - 10.3 mg/dL   Total Protein 6.9 6.5 - 8.1 g/dL   Albumin 3.3 (L) 3.5 - 5.0 g/dL   AST 23 15 - 41 U/L   ALT 14 14 - 54 U/L   Alkaline Phosphatase 82 38 - 126 U/L   Total Bilirubin 0.6 0.3 - 1.2 mg/dL   GFR calc non Af Amer >60 >60 mL/min   GFR calc Af Amer >60 >60 mL/min    Comment: (NOTE) The eGFR has been calculated using the CKD EPI equation. This calculation has not been validated in all clinical situations. eGFR's persistently <60 mL/min  signify possible Chronic Kidney Disease.    Anion gap 10 5 - 15  CBC WITH DIFFERENTIAL     Status: Abnormal   Collection Time: 02/22/15  4:38 AM  Result Value Ref Range   WBC 7.3 4.0 - 10.5 K/uL   RBC 4.44 3.87 - 5.11 MIL/uL   Hemoglobin 13.4 12.0 - 15.0 g/dL   HCT 39.4 36.0 - 46.0 %   MCV 88.7 78.0 - 100.0 fL   MCH 30.2 26.0 - 34.0 pg   MCHC 34.0 30.0 - 36.0 g/dL   RDW 13.4 11.5 - 15.5 %   Platelets 301 150 - 400 K/uL   Neutrophils Relative % 88 %   Neutro Abs 6.5 1.7 - 7.7 K/uL   Lymphocytes Relative 11 %   Lymphs Abs 0.8 0.7 - 4.0 K/uL   Monocytes Relative 1 %   Monocytes Absolute 0.0 (L) 0.1 - 1.0 K/uL   Eosinophils Relative 0 %   Eosinophils Absolute 0.0 0.0 - 0.7 K/uL   Basophils Relative 0 %   Basophils Absolute 0.0 0.0 - 0.1 K/uL    Ct Soft Tissue Neck W Contrast  02/21/2015  CLINICAL DATA:  Swelling in the right side of the tongue. Difficulty swallowing. Left-sided facial numbness. EXAM: CT NECK WITH CONTRAST TECHNIQUE: Multidetector CT imaging of the neck was performed using the standard protocol following the bolus administration of intravenous contrast. CONTRAST:  14m OMNIPAQUE IOHEXOL 300 MG/ML  SOLN COMPARISON:  MRI brain 02/21/2015 and MRI brain 02/20/2015. FINDINGS: Pharynx and larynx: Edematous changes are present at the right base of tongue extending into the vallecula. It is unclear whether this represents focal mucosal edema or a mass lesion at the right base of tongue. The asymmetric enhancement appears to extend into the vallecula. There is asymmetric lateral deviation of the right true vocal cord. Salivary glands: Marked inflammatory changes are noted alignment the medial and superior aspect of the right submandibular gland. This extends into the right side of the tongue. Edematous changes are noted into the anterior aspect of the right-sided the tongue. There is some stranding in the right parapharyngeal fat compared  to the left. No definite duct obstruction is  present although there is some inflammatory change about the submandibular duct. Benign appearing lymph nodes are evident within the parotid glands. The left submandibular gland is within normal limits. Thyroid: There is mild asymmetry of the thyroid. A 10 mm nodule is present on the right. Lymph nodes: An 11 mm somewhat rounded posterior left level 2 lymph node is present. A 10 mm somewhat rounded right level 2 lymph nodes present. These are likely reactive. No definite pathologic nodes are seen. Vascular: Atherosclerotic calcifications are present at both carotid bifurcations without significant stenoses relative to the more distal vessel. Limited intracranial: The visualized posterior fossa is unremarkable. Visualized orbits: Not visualize Mastoids and visualized paranasal sinuses: Clear Skeleton: Template degenerative change in uncovertebral disease is most severe at C3-4. This is worse left than right. Vertebral body heights are normal. There is slight anterolisthesis at C6-7. Upper chest: Interstitial thickening is more prominent right than left. This may reflect some edema. IMPRESSION: 1. Edematous changes at the right base of tongue, right submandibular gland and extending into the anterior tongue which was visualize static MRI. This appears to be inflammatory process may be related to inflammation within the right submandibular duct or right-sided cell adenitis. No discrete abscess is present. 2. Bilateral borderline level 2 lymph nodes are likely reactive. 3. Asymmetric enhancement and thickening of the mucosa at the right base of tongue extending into the vallecular. Recommend direct evaluation of this area for possible neoplasm. This most likely represents inflammatory change. 4. 10 mm right-sided thyroid nodule Consider further evaluation with thyroid ultrasound. If patient is clinically hyperthyroid, consider nuclear medicine thyroid uptake and scan. 5. Mild degenerative change of the cervical spine is  centered at C3-4. 6. Atherosclerotic changes at the carotid arteries bilaterally. Electronically Signed   By: San Morelle M.D.   On: 02/21/2015 17:39   Mr Virgel Paling Wo Contrast  02/20/2015  CLINICAL DATA:  81 year old female with dysarthria and facial deficits suspicious for TIA. Intermittent left face and tongue numbness for 1 month. Initial encounter. EXAM: MRI HEAD WITHOUT CONTRAST MRA HEAD WITHOUT CONTRAST TECHNIQUE: Multiplanar, multiecho pulse sequences of the brain and surrounding structures were obtained without intravenous contrast. Angiographic images of the head were obtained using MRA technique without contrast. COMPARISON:  [facility] brain MRI report 02/21/2011 (no images available). FINDINGS: MRI HEAD FINDINGS No restricted diffusion or evidence of acute infarction. Major intracranial vascular flow voids are preserved. Cerebral volume is within normal limits for age. No midline shift, mass effect, evidence of mass lesion, ventriculomegaly, extra-axial collection or acute intracranial hemorrhage. Cervicomedullary junction and pituitary are within normal limits. CSF pulsation artifact suspected in the left subarachnoid space at the anterior insula (series 5 image 13), with no signal abnormality there on any other sequence. [place] Cables and white matter signal is within normal limits for age throughout the brain. No cortical encephalomalacia or definite chronic cerebral blood products. Visible internal auditory structures appear normal. Mastoids are clear. Trace paranasal sinus mucosal thickening. Orbit and face soft tissues appear normal. There is a smooth polypoid soft tissue lesion in the midline of the nasopharynx measuring 13-14 mm diameter (sagittal image 11). This does demonstrate increased diffusion (series 100, image 4). It does not resemble typical adenoid hypertrophy or Tornwaldt cyst. Otherwise no skullbase abnormality identified. Normal stylomastoid foramina. Normal bone  marrow signal. Negative visualized cervical spine. MRA HEAD FINDINGS Antegrade flow in the posterior circulation with codominant distal vertebral arteries. Normal PICA origins.  Normal vertebrobasilar junction. No basilar stenosis. SCA and right PCA origins are normal. Posterior communicating arteries are diminutive or absent. The left PCA appears occluded just beyond its origin in the P1 segment. No distal reconstituted left PCA flow signal identified. Right PCA branches appear normal. Antegrade flow in both ICA siphons. There is irregularity of the distal cervical ICAs greater on the left. The left ICA also appears to have the retropharyngeal course. Antegrade flow in both ICA siphons. No siphon stenosis. Ophthalmic artery origins are within normal limits. Bilateral carotid termini are patent. MCA and ACA origins are within normal limits. Anterior communicating artery and visualized ACA branches are within normal limits. Visualized right MCA branches are within normal limits. The left MCA M1 segment is within normal limits. There is an early left MCA bifurcation. There are high-grade stenoses in 2 posterior MCA M2 segment divisions with preserved distal flow. See 109 image 14. The anterior M2 branch and other left MCA branches are within normal limits. IMPRESSION: 1. No acute infarct and largely unremarkable for age noncontrast MRI appearance of the brain. 2. Left PCA occlusion, presumably chronic in light of no acute MRI findings in that vascular territory. 3. 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 4. Polypoid up to 1.4 cm soft tissue lesion in the midline at the nasopharynx, not typical in appearance for Tornwaldt cyst, but appearance does favor benign etiology. Recommend ENT follow-up. Study discussed by telephone with Dr. Kenn File on 02/20/2015 at 1710 hours. Electronically Signed   By: Genevie Ann M.D.   On: 02/20/2015 17:14   Mr Brain Wo Contrast  02/21/2015   CLINICAL DATA:  81 year old female with right tongue numbness, difficulty swallowing and talking. On physical exam the right aspect over tongue appear swollen. Outpatient brain MRI and intracranial MRA yesterday negative for acute stroke, but positive for intracranial atherosclerosis. Subsequent encounter. EXAM: MRI HEAD WITHOUT CONTRAST TECHNIQUE: Multiplanar, multiecho pulse sequences of the brain and surrounding structures were obtained without intravenous contrast. COMPARISON:  Brain MRI and MRA 02/20/15. FINDINGS: Unchanged cerebral volume. Major intracranial vascular flow voids are stable. No restricted diffusion to suggest acute infarction. No midline shift, mass effect, evidence of mass lesion, ventriculomegaly, extra-axial collection or acute intracranial hemorrhage. Cervicomedullary junction and pituitary are within normal limits. Stable gray and white matter signal since yesterday. Negative visualized cervical spine. Normal bone marrow signal. Unchanged nasopharynx. Paranasal sinuses and mastoids remain clear. Negative orbit and scalp soft tissues. Today examination was performed such that the entire mouth and jaw is visible. There is abnormal T2 hyperintensity and enlargement in the right tongue (series 6, image 7). There is asymmetric T2 hyperintensity resembling a small volume of tracking fluid in the right parapharyngeal and submandibular spaces (series 6, image 3). There may be a small volume of right sublingual space fluid. The mandible is intact with normal bone marrow signal. The submandibular glands are not definitely asymmetric. The cervical right ICA flow void is tortuous and difficult to trace throughout its course. There is associated mild effacement of the right lateral pharynx. No lymphadenopathy. IMPRESSION: 1. No acute infarct and stable noncontrast MRI appearance of the brain since yesterday. No skullbase abnormality identified. 2. However, there is T2 hyperintensity and enlargement in  the right aspect of the tongue associated with inflammatory appearing signal changes in the right parapharyngeal, submandibular, and possibly also sublingual space. No lymphadenopathy or drainable fluid collection suggested. Recommend follow-up neck CT with IV contrast to evaluate further. Study  discussed by telephone with Dr. Orlie Dakin on 02/21/2015 at 1600 hours. Electronically Signed   By: Genevie Ann M.D.   On: 02/21/2015 16:18   Mr Brain Wo Contrast  02/20/2015  CLINICAL DATA:  81 year old female with dysarthria and facial deficits suspicious for TIA. Intermittent left face and tongue numbness for 1 month. Initial encounter. EXAM: MRI HEAD WITHOUT CONTRAST MRA HEAD WITHOUT CONTRAST TECHNIQUE: Multiplanar, multiecho pulse sequences of the brain and surrounding structures were obtained without intravenous contrast. Angiographic images of the head were obtained using MRA technique without contrast. COMPARISON:  [facility] brain MRI report 02/21/2011 (no images available). FINDINGS: MRI HEAD FINDINGS No restricted diffusion or evidence of acute infarction. Major intracranial vascular flow voids are preserved. Cerebral volume is within normal limits for age. No midline shift, mass effect, evidence of mass lesion, ventriculomegaly, extra-axial collection or acute intracranial hemorrhage. Cervicomedullary junction and pituitary are within normal limits. CSF pulsation artifact suspected in the left subarachnoid space at the anterior insula (series 5 image 13), with no signal abnormality there on any other sequence. [place] Cables and white matter signal is within normal limits for age throughout the brain. No cortical encephalomalacia or definite chronic cerebral blood products. Visible internal auditory structures appear normal. Mastoids are clear. Trace paranasal sinus mucosal thickening. Orbit and face soft tissues appear normal. There is a smooth polypoid soft tissue lesion in the midline of the  nasopharynx measuring 13-14 mm diameter (sagittal image 11). This does demonstrate increased diffusion (series 100, image 4). It does not resemble typical adenoid hypertrophy or Tornwaldt cyst. Otherwise no skullbase abnormality identified. Normal stylomastoid foramina. Normal bone marrow signal. Negative visualized cervical spine. MRA HEAD FINDINGS Antegrade flow in the posterior circulation with codominant distal vertebral arteries. Normal PICA origins. Normal vertebrobasilar junction. No basilar stenosis. SCA and right PCA origins are normal. Posterior communicating arteries are diminutive or absent. The left PCA appears occluded just beyond its origin in the P1 segment. No distal reconstituted left PCA flow signal identified. Right PCA branches appear normal. Antegrade flow in both ICA siphons. There is irregularity of the distal cervical ICAs greater on the left. The left ICA also appears to have the retropharyngeal course. Antegrade flow in both ICA siphons. No siphon stenosis. Ophthalmic artery origins are within normal limits. Bilateral carotid termini are patent. MCA and ACA origins are within normal limits. Anterior communicating artery and visualized ACA branches are within normal limits. Visualized right MCA branches are within normal limits. The left MCA M1 segment is within normal limits. There is an early left MCA bifurcation. There are high-grade stenoses in 2 posterior MCA M2 segment divisions with preserved distal flow. See 109 image 14. The anterior M2 branch and other left MCA branches are within normal limits. IMPRESSION: 1. No acute infarct and largely unremarkable for age noncontrast MRI appearance of the brain. 2. Left PCA occlusion, presumably chronic in light of no acute MRI findings in that vascular territory. 3. 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 4. Polypoid up to 1.4 cm soft tissue lesion in the midline at the nasopharynx, not typical in  appearance for Tornwaldt cyst, but appearance does favor benign etiology. Recommend ENT follow-up. Study discussed by telephone with Dr. Kenn File on 02/20/2015 at 1710 hours. Electronically Signed   By: Genevie Ann M.D.   On: 02/20/2015 17:14   Review of Systems: As presented above, the rests are negative.  Blood pressure 119/51, pulse 74, temperature 98.5 F (  36.9 C), temperature source Oral, resp. rate 20, height 5' 4"  (1.626 m), weight 69.1 kg (152 lb 5.4 oz), SpO2 99 %. Gen:  NAD, alert, cooperative with exam HEENT: NCAT. Pupils are equal, round, reactive to light. Extraocular motion is intact. Examination of the ears shows normal auricles and external auditory canals bilaterally. Both tympanic membranes are intact.  Nasal examination shows normal mucosa and turbinates. Facial examination shows no asymmetry. Palpation of the face elicit no significant tenderness. Oral cavity examination shows no mucosal lacerations.No significant tongue edema is noted this morning. No significant trismus is noted. Palpation of the neck reveals no lymphadenopathy or mass. The trachea is midline. The thyroid is not significantly enlarged. Cranial nerves 2-12 are all grossly in tact. CV: RRR Resp: CTABL, no wheezes, non-labored Ext: No edema, warm Neuro: Alert and oriented, Strength 5/5 and sensation intact in all 4 extremities, CN 2-12 intact, normal speech.  Procedure:  Flexible Fiberoptic Laryngoscopy Anesthesia: None Indication: Persistent throat pain. Description: Risks, benefits, and alternatives of flexible endoscopy were explained to the patient. Specific mention was made of the risk of throat numbness with difficulty swallowing, possible bleeding from the nose and mouth, and pain from the procedure.  The patient gave oral consent to proceed.  The flexible scope was inserted into the right nasal cavity and advanced towards the nasopharynx.  Visualized mucosa over the turbinates and septum were normal.  A  1.5cm soft tissue mass is noted within the right nasopharynx.  Oropharyngeal walls were symmetric and mobile without lesion, mass, or edema.  Hypopharynx was also without  lesion or edema.  Larynx was mobile without lesions.  No lesions or asymmetry in the supraglottic larynx.  Arytenoid mucosa and posterior commissure were normal.  True vocal folds were pale yellow and without mass or lesion.    Assessment/Plan: The patient's symptoms have mostly resolved with medical treatment. No significant tongue edema or erythema is noted today.  Incidental finding of a 1.5 cm right nasopharyngeal mass on laryngoscopy exam. This may be a benign cyst. Consider d/c home with oral clindamycin (e.g. 350m QID for 7 days) and prednisone dose pak (e.g. 150m6day pak). Pt is instructed to follow up in my office in 1 week.  Yzabelle Calles,SUI W 02/22/2015, 8:09 AM

## 2015-02-22 NOTE — Progress Notes (Signed)
Pt to d/c home this pm. Reviewed d/c instructions, f/up appts, when to call 911, etc. Printed d/c summary in Muse. Pt & family indicated understanding.

## 2015-02-22 NOTE — ED Provider Notes (Signed)
CSN: [id number]     Arrival date & time 02/21/15  1202 History   First MD Initiated Contact with Patient 02/21/15 1209     Chief Complaint  Patient presents with  . Numbness     (Consider location/radiation/quality/duration/timing/severity/associated sxs/prior Treatment) HPI Comments: Patient presents to the ED with a chief complaint of facial numbness and aphasia.  She is accompanied by her son who provides the history.  Patient was seen last night at [facility] hospital for intermittent facial numbness and aphasia.  Son states that when she awoke this morning the symptoms were again present and were worse.  Normally the symptoms resolve, but this time they did not.  She was takes an aspirin.  There are not other associated symptoms.  The history is provided by a relative. No language interpreter was used.    Past Medical History  Diagnosis Date  . Unsteady gait   . Arthritis of right foot   . Palpitations   . Hx: UTI (urinary tract infection)    Past Surgical History  Procedure Laterality Date  . None     Family History  Problem Relation Age of Onset  . Heart attack Mother 59    Died in [place]   Social History  Substance Use Topics  . Smoking status: Never Smoker   . Smokeless tobacco: None  . Alcohol Use: No   OB History    No data available     Review of Systems  Constitutional: Negative for fever and chills.  Respiratory: Negative for shortness of breath.   Cardiovascular: Negative for chest pain.  Gastrointestinal: Negative for nausea, vomiting, diarrhea and constipation.  Genitourinary: Negative for dysuria.  Neurological: Positive for facial asymmetry and numbness.  All other systems reviewed and are negative.     Allergies  Review of patient's allergies indicates no known allergies.  Home Medications   Prior to Admission medications   Medication Sig Start Date End Date Taking? Authorizing Provider  aspirin EC 81 MG tablet Take 81 mg by mouth daily.   Yes  Historical Provider, MD  fluticasone (FLONASE) 50 MCG/ACT nasal spray Place 2 sprays into both nostrils daily. 02/19/15  Yes Timmothy Euler, MD  Ibuprofen (ADVIL) 200 MG CAPS Take 200 mg by mouth as needed (pain).    Yes Historical Provider, MD  lisinopril (PRINIVIL,ZESTRIL) 20 MG tablet Take 1 tablet (20 mg total) by mouth daily. Patient not taking: Reported on 02/21/2015 02/19/15   Timmothy Euler, MD  traZODone (DESYREL) 50 MG tablet Take 0.5 tablets (25 mg total) by mouth at bedtime as needed for sleep. toma una mitad de una pastilla por la noche Patient not taking: Reported on 02/19/2015 01/05/15   Fransisca Kaufmann Dettinger, MD  VOLTAREN 1 % GEL Apply to R ankle 3 to 4 times daily as needed for pain Patient not taking: Reported on 02/21/2015 02/19/15 02/19/16  Timmothy Euler, MD   BP 132/62 mmHg  Pulse 74  Temp(Src) 97.9 F (36.6 C) (Oral)  Resp 20  Ht 5\' 4"  (1.626 m)  Wt 152 lb 5.4 oz (69.1 kg)  BMI 26.14 kg/m2  SpO2 99% Physical Exam  Constitutional: She is oriented to person, place, and time. She appears well-developed and well-nourished.  HENT:  Head: Normocephalic and atraumatic.  Eyes: Conjunctivae and EOM are normal. Pupils are equal, round, and reactive to light.  Neck: Normal range of motion. Neck supple.  Cardiovascular: Normal rate and regular rhythm.  Exam reveals no gallop and no friction  rub.   No murmur heard. Pulmonary/Chest: Effort normal and breath sounds normal. No respiratory distress. She has no wheezes. She has no rales. She exhibits no tenderness.  Abdominal: Soft. Bowel sounds are normal. She exhibits no distension and no mass. There is no tenderness. There is no rebound and no guarding.  Musculoskeletal: Normal range of motion. She exhibits no edema or tenderness.  Neurological: She is alert and oriented to person, place, and time.  Right sided facial droop, softening of nasolabial folds, aphasia, otherwise neurovascularly intact, movements are goal  oriented, ROM and strength 5/5  Skin: Skin is warm and dry.  Psychiatric: She has a normal mood and affect. Her behavior is normal. Judgment and thought content normal.  Nursing note and vitals reviewed.   ED Course  Procedures (including critical care time) Labs Review Labs Reviewed  URINALYSIS, ROUTINE W REFLEX MICROSCOPIC (NOT AT [facility]) - Abnormal; Notable for the following:    APPearance CLOUDY (*)    Hgb urine dipstick SMALL (*)    Leukocytes, UA SMALL (*)    All other components within normal limits  COMPREHENSIVE METABOLIC PANEL - Abnormal; Notable for the following:    CO2 21 (*)    Glucose, Bld 147 (*)    Albumin 3.3 (*)    All other components within normal limits  CBC WITH DIFFERENTIAL/PLATELET - Abnormal; Notable for the following:    Monocytes Absolute 0.0 (*)    All other components within normal limits  GLUCOSE, CAPILLARY - Abnormal; Notable for the following:    Glucose-Capillary 154 (*)    All other components within normal limits  GLUCOSE, CAPILLARY - Abnormal; Notable for the following:    Glucose-Capillary 145 (*)    All other components within normal limits  I-STAT CHEM 8, ED - Abnormal; Notable for the following:    Hemoglobin 15.6 (*)    All other components within normal limits  MRSA PCR SCREENING  ETHANOL  PROTIME-INR  APTT  CBC  DIFFERENTIAL  COMPREHENSIVE METABOLIC PANEL  URINE RAPID DRUG SCREEN, HOSP PERFORMED  URINE MICROSCOPIC-ADD ON  T4, FREE  T3, FREE  I-STAT TROPOININ, ED  CBG MONITORING, ED    Imaging Review Ct Soft Tissue Neck W Contrast  02/21/2015  CLINICAL DATA:  Swelling in the right side of the tongue. Difficulty swallowing. Left-sided facial numbness. EXAM: CT NECK WITH CONTRAST TECHNIQUE: Multidetector CT imaging of the neck was performed using the standard protocol following the bolus administration of intravenous contrast. CONTRAST:  44mL OMNIPAQUE IOHEXOL 300 MG/ML  SOLN COMPARISON:  MRI brain 02/21/2015 and MRI brain  02/20/2015. FINDINGS: Pharynx and larynx: Edematous changes are present at the right base of tongue extending into the vallecula. It is unclear whether this represents focal mucosal edema or a mass lesion at the right base of tongue. The asymmetric enhancement appears to extend into the vallecula. There is asymmetric lateral deviation of the right true vocal cord. Salivary glands: Marked inflammatory changes are noted alignment the medial and superior aspect of the right submandibular gland. This extends into the right side of the tongue. Edematous changes are noted into the anterior aspect of the right-sided the tongue. There is some stranding in the right parapharyngeal fat compared to the left. No definite duct obstruction is present although there is some inflammatory change about the submandibular duct. Benign appearing lymph nodes are evident within the parotid glands. The left submandibular gland is within normal limits. Thyroid: There is mild asymmetry of the thyroid. A 10 mm nodule  is present on the right. Lymph nodes: An 11 mm somewhat rounded posterior left level 2 lymph node is present. A 10 mm somewhat rounded right level 2 lymph nodes present. These are likely reactive. No definite pathologic nodes are seen. Vascular: Atherosclerotic calcifications are present at both carotid bifurcations without significant stenoses relative to the more distal vessel. Limited intracranial: The visualized posterior fossa is unremarkable. Visualized orbits: Not visualize Mastoids and visualized paranasal sinuses: Clear Skeleton: Template degenerative change in uncovertebral disease is most severe at C3-4. This is worse left than right. Vertebral body heights are normal. There is slight anterolisthesis at C6-7. Upper chest: Interstitial thickening is more prominent right than left. This may reflect some edema. IMPRESSION: 1. Edematous changes at the right base of tongue, right submandibular gland and extending into the  anterior tongue which was visualize static MRI. This appears to be inflammatory process may be related to inflammation within the right submandibular duct or right-sided cell adenitis. No discrete abscess is present. 2. Bilateral borderline level 2 lymph nodes are likely reactive. 3. Asymmetric enhancement and thickening of the mucosa at the right base of tongue extending into the vallecular. Recommend direct evaluation of this area for possible neoplasm. This most likely represents inflammatory change. 4. 10 mm right-sided thyroid nodule Consider further evaluation with thyroid ultrasound. If patient is clinically hyperthyroid, consider nuclear medicine thyroid uptake and scan. 5. Mild degenerative change of the cervical spine is centered at C3-4. 6. Atherosclerotic changes at the carotid arteries bilaterally. Electronically Signed   By: San Morelle M.D.   On: 02/21/2015 17:39   Mr Virgel Paling Wo Contrast  02/20/2015  CLINICAL DATA:  81 year old female with dysarthria and facial deficits suspicious for TIA. Intermittent left face and tongue numbness for 1 month. Initial encounter. EXAM: MRI HEAD WITHOUT CONTRAST MRA HEAD WITHOUT CONTRAST TECHNIQUE: Multiplanar, multiecho pulse sequences of the brain and surrounding structures were obtained without intravenous contrast. Angiographic images of the head were obtained using MRA technique without contrast. COMPARISON:  [facility] brain MRI report 02/21/2011 (no images available). FINDINGS: MRI HEAD FINDINGS No restricted diffusion or evidence of acute infarction. Major intracranial vascular flow voids are preserved. Cerebral volume is within normal limits for age. No midline shift, mass effect, evidence of mass lesion, ventriculomegaly, extra-axial collection or acute intracranial hemorrhage. Cervicomedullary junction and pituitary are within normal limits. CSF pulsation artifact suspected in the left subarachnoid space at the anterior insula  (series 5 image 13), with no signal abnormality there on any other sequence. Pear[place] Cables and white matter signal is within normal limits for age throughout the brain. No cortical encephalomalacia or definite chronic cerebral blood products. Visible internal auditory structures appear normal. Mastoids are clear. Trace paranasal sinus mucosal thickening. Orbit and face soft tissues appear normal. There is a smooth polypoid soft tissue lesion in the midline of the nasopharynx measuring 13-14 mm diameter (sagittal image 11). This does demonstrate increased diffusion (series 100, image 4). It does not resemble typical adenoid hypertrophy or Tornwaldt cyst. Otherwise no skullbase abnormality identified. Normal stylomastoid foramina. Normal bone marrow signal. Negative visualized cervical spine. MRA HEAD FINDINGS Antegrade flow in the posterior circulation with codominant distal vertebral arteries. Normal PICA origins. Normal vertebrobasilar junction. No basilar stenosis. SCA and right PCA origins are normal. Posterior communicating arteries are diminutive or absent. The left PCA appears occluded just beyond its origin in the P1 segment. No distal reconstituted left PCA flow signal identified. Right PCA branches appear normal. Antegrade flow in both  ICA siphons. There is irregularity of the distal cervical ICAs greater on the left. The left ICA also appears to have the retropharyngeal course. Antegrade flow in both ICA siphons. No siphon stenosis. Ophthalmic artery origins are within normal limits. Bilateral carotid termini are patent. MCA and ACA origins are within normal limits. Anterior communicating artery and visualized ACA branches are within normal limits. Visualized right MCA branches are within normal limits. The left MCA M1 segment is within normal limits. There is an early left MCA bifurcation. There are high-grade stenoses in 2 posterior MCA M2 segment divisions with preserved distal flow. See 109 image 14. The  anterior M2 branch and other left MCA branches are within normal limits. IMPRESSION: 1. No acute infarct and largely unremarkable for age noncontrast MRI appearance of the brain. 2. Left PCA occlusion, presumably chronic in light of no acute MRI findings in that vascular territory. 3. 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 4. Polypoid up to 1.4 cm soft tissue lesion in the midline at the nasopharynx, not typical in appearance for Tornwaldt cyst, but appearance does favor benign etiology. Recommend ENT follow-up. Study discussed by telephone with Dr. Kenn File on 02/20/2015 at 1710 hours. Electronically Signed   By: Genevie Ann M.D.   On: 02/20/2015 17:14   Mr Brain Wo Contrast  02/21/2015  CLINICAL DATA:  81 year old female with right tongue numbness, difficulty swallowing and talking. On physical exam the right aspect over tongue appear swollen. Outpatient brain MRI and intracranial MRA yesterday negative for acute stroke, but positive for intracranial atherosclerosis. Subsequent encounter. EXAM: MRI HEAD WITHOUT CONTRAST TECHNIQUE: Multiplanar, multiecho pulse sequences of the brain and surrounding structures were obtained without intravenous contrast. COMPARISON:  Brain MRI and MRA 02/20/15. FINDINGS: Unchanged cerebral volume. Major intracranial vascular flow voids are stable. No restricted diffusion to suggest acute infarction. No midline shift, mass effect, evidence of mass lesion, ventriculomegaly, extra-axial collection or acute intracranial hemorrhage. Cervicomedullary junction and pituitary are within normal limits. Stable gray and white matter signal since yesterday. Negative visualized cervical spine. Normal bone marrow signal. Unchanged nasopharynx. Paranasal sinuses and mastoids remain clear. Negative orbit and scalp soft tissues. Today examination was performed such that the entire mouth and jaw is visible. There is abnormal T2 hyperintensity and  enlargement in the right tongue (series 6, image 7). There is asymmetric T2 hyperintensity resembling a small volume of tracking fluid in the right parapharyngeal and submandibular spaces (series 6, image 3). There may be a small volume of right sublingual space fluid. The mandible is intact with normal bone marrow signal. The submandibular glands are not definitely asymmetric. The cervical right ICA flow void is tortuous and difficult to trace throughout its course. There is associated mild effacement of the right lateral pharynx. No lymphadenopathy. IMPRESSION: 1. No acute infarct and stable noncontrast MRI appearance of the brain since yesterday. No skullbase abnormality identified. 2. However, there is T2 hyperintensity and enlargement in the right aspect of the tongue associated with inflammatory appearing signal changes in the right parapharyngeal, submandibular, and possibly also sublingual space. No lymphadenopathy or drainable fluid collection suggested. Recommend follow-up neck CT with IV contrast to evaluate further. Study discussed by telephone with Dr. Orlie Dakin on 02/21/2015 at 1600 hours. Electronically Signed   By: Genevie Ann M.D.   On: 02/21/2015 16:18   Mr Brain Wo Contrast  02/20/2015  CLINICAL DATA:  81 year old female with dysarthria and facial deficits suspicious for TIA. Intermittent left  face and tongue numbness for 1 month. Initial encounter. EXAM: MRI HEAD WITHOUT CONTRAST MRA HEAD WITHOUT CONTRAST TECHNIQUE: Multiplanar, multiecho pulse sequences of the brain and surrounding structures were obtained without intravenous contrast. Angiographic images of the head were obtained using MRA technique without contrast. COMPARISON:  [facility] brain MRI report 02/21/2011 (no images available). FINDINGS: MRI HEAD FINDINGS No restricted diffusion or evidence of acute infarction. Major intracranial vascular flow voids are preserved. Cerebral volume is within normal limits for age.  No midline shift, mass effect, evidence of mass lesion, ventriculomegaly, extra-axial collection or acute intracranial hemorrhage. Cervicomedullary junction and pituitary are within normal limits. CSF pulsation artifact suspected in the left subarachnoid space at the anterior insula (series 5 image 13), with no signal abnormality there on any other sequence. Pear[place] Cables and white matter signal is within normal limits for age throughout the brain. No cortical encephalomalacia or definite chronic cerebral blood products. Visible internal auditory structures appear normal. Mastoids are clear. Trace paranasal sinus mucosal thickening. Orbit and face soft tissues appear normal. There is a smooth polypoid soft tissue lesion in the midline of the nasopharynx measuring 13-14 mm diameter (sagittal image 11). This does demonstrate increased diffusion (series 100, image 4). It does not resemble typical adenoid hypertrophy or Tornwaldt cyst. Otherwise no skullbase abnormality identified. Normal stylomastoid foramina. Normal bone marrow signal. Negative visualized cervical spine. MRA HEAD FINDINGS Antegrade flow in the posterior circulation with codominant distal vertebral arteries. Normal PICA origins. Normal vertebrobasilar junction. No basilar stenosis. SCA and right PCA origins are normal. Posterior communicating arteries are diminutive or absent. The left PCA appears occluded just beyond its origin in the P1 segment. No distal reconstituted left PCA flow signal identified. Right PCA branches appear normal. Antegrade flow in both ICA siphons. There is irregularity of the distal cervical ICAs greater on the left. The left ICA also appears to have the retropharyngeal course. Antegrade flow in both ICA siphons. No siphon stenosis. Ophthalmic artery origins are within normal limits. Bilateral carotid termini are patent. MCA and ACA origins are within normal limits. Anterior communicating artery and visualized ACA branches are within  normal limits. Visualized right MCA branches are within normal limits. The left MCA M1 segment is within normal limits. There is an early left MCA bifurcation. There are high-grade stenoses in 2 posterior MCA M2 segment divisions with preserved distal flow. See 109 image 14. The anterior M2 branch and other left MCA branches are within normal limits. IMPRESSION: 1. No acute infarct and largely unremarkable for age noncontrast MRI appearance of the brain. 2. Left PCA occlusion, presumably chronic in light of no acute MRI findings in that vascular territory. 3. 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 4. Polypoid up to 1.4 cm soft tissue lesion in the midline at the nasopharynx, not typical in appearance for Tornwaldt cyst, but appearance does favor benign etiology. Recommend ENT follow-up. Study discussed by telephone with Dr. Kenn File on 02/20/2015 at 1710 hours. Electronically Signed   By: Genevie Ann M.D.   On: 02/20/2015 17:14   I have personally reviewed and evaluated these images and lab results as part of my medical decision-making.   MDM   Final diagnoses:  Disorder of oropharynx    Patient with worsening facial numbness and aphasia.  Seen last night at [facility].  Discharged on aspirin after workup.  Symptoms worsened this morning.  Patient discussed with and immediately seen by Dr. Winfred Leeds.  Dr. Winfred Leeds discussed  patient with Dr. Leonel Ramsay, who recommends repeat MRI and MRA.  MRs pending.  Patient will be signed out to oncoming team.  Disposition per neurology and MR results.    Montine Circle, PA-C 02/22/15 [place], MD 02/22/15 [phone]

## 2015-02-23 LAB — T3, FREE: T3, Free: 1.9 pg/mL — ABNORMAL LOW (ref 2.0–4.4)

## 2015-02-28 DIAGNOSIS — D3705 Neoplasm of uncertain behavior of pharynx: Secondary | ICD-10-CM | POA: Diagnosis not present

## 2015-03-08 ENCOUNTER — Ambulatory Visit: Payer: Medicare Other | Admitting: Family

## 2015-03-09 ENCOUNTER — Ambulatory Visit (INDEPENDENT_AMBULATORY_CARE_PROVIDER_SITE_OTHER): Payer: Medicare Other | Admitting: Family

## 2015-03-09 ENCOUNTER — Encounter: Payer: Self-pay | Admitting: Family

## 2015-03-09 VITALS — BP 145/70 | HR 75 | Temp 98.0°F | Ht 64.0 in | Wt 146.8 lb

## 2015-03-09 DIAGNOSIS — J219 Acute bronchiolitis, unspecified: Secondary | ICD-10-CM | POA: Diagnosis not present

## 2015-03-09 MED ORDER — HYDROCODONE-HOMATROPINE 5-1.5 MG/5ML PO SYRP: 5.0000 mL | ORAL_SOLUTION | Freq: Three times a day (TID) | ORAL | Status: DC | PRN

## 2015-03-09 MED ORDER — LEVOFLOXACIN 500 MG PO TABS: 500.0000 mg | ORAL_TABLET | Freq: Every day | ORAL | Status: DC

## 2015-03-09 MED ORDER — METHYLPREDNISOLONE ACETATE 80 MG/ML IJ SUSP
80.0000 mg | Freq: Once | INTRAMUSCULAR | Status: AC
  Administered 2015-03-09: 80 mg via INTRAMUSCULAR

## 2015-03-09 NOTE — Patient Instructions (Signed)
Acute Bronchitis Bronchitis is inflammation of the airways that extend from the windpipe into the lungs (bronchi). The inflammation often causes mucus to develop. This leads to a cough, which is the most common symptom of bronchitis.  In acute bronchitis, the condition usually develops suddenly and goes away over time, usually in a couple weeks. Smoking, allergies, and asthma can make bronchitis worse. Repeated episodes of bronchitis may cause further lung problems.  CAUSES Acute bronchitis is most often caused by the same virus that causes a cold. The virus can spread from person to person (contagious) through coughing, sneezing, and touching contaminated objects. SIGNS AND SYMPTOMS   Cough.   Fever.   Coughing up mucus.   Body aches.   Chest congestion.   Chills.   Shortness of breath.   Sore throat.  DIAGNOSIS  Acute bronchitis is usually diagnosed through a physical exam. Your health care provider will also ask you questions about your medical history. Tests, such as chest X-rays, are sometimes done to rule out other conditions.  TREATMENT  Acute bronchitis usually goes away in a couple weeks. Oftentimes, no medical treatment is necessary. Medicines are sometimes given for relief of fever or cough. Antibiotic medicines are usually not needed but may be prescribed in certain situations. In some cases, an inhaler may be recommended to help reduce shortness of breath and control the cough. A cool mist vaporizer may also be used to help thin bronchial secretions and make it easier to clear the chest.  HOME CARE INSTRUCTIONS  Get plenty of rest.   Drink enough fluids to keep your urine clear or pale yellow (unless you have a medical condition that requires fluid restriction). Increasing fluids may help thin your respiratory secretions (sputum) and reduce chest congestion, and it will prevent dehydration.   Take medicines only as directed by your health care provider.  If  you were prescribed an antibiotic medicine, finish it all even if you start to feel better.  Avoid smoking and secondhand smoke. Exposure to cigarette smoke or irritating chemicals will make bronchitis worse. If you are a smoker, consider using nicotine gum or skin patches to help control withdrawal symptoms. Quitting smoking will help your lungs heal faster.   Reduce the chances of another bout of acute bronchitis by washing your hands frequently, avoiding people with cold symptoms, and trying not to touch your hands to your mouth, nose, or eyes.   Keep all follow-up visits as directed by your health care provider.  SEEK MEDICAL CARE IF: Your symptoms do not improve after 1 week of treatment.  SEEK IMMEDIATE MEDICAL CARE IF:  You develop an increased fever or chills.   You have chest pain.   You have severe shortness of breath.  You have bloody sputum.   You develop dehydration.  You faint or repeatedly feel like you are going to pass out.  You develop repeated vomiting.  You develop a severe headache. MAKE SURE YOU:   Understand these instructions.  Will watch your condition.  Will get help right away if you are not doing well or get worse.   This information is not intended to replace advice given to you by your health care provider. Make sure you discuss any questions you have with your health care provider.   Document Released: 05/15/2004 Document Revised: 04/28/2014 Document Reviewed: 09/28/2012 Elsevier Interactive Patient Education 2016 Elsevier Inc.  - Take meds as prescribed - Use a cool mist humidifier  -Use saline nose sprays frequently -Saline   irrigations of the nose can be very helpful if done frequently.  * 4X daily for 1 week*  * Use of a nettie pot can be helpful with this. Follow directions with this* -Force fluids -For any cough or congestion  Use plain Mucinex- regular strength or max strength is fine   * Children- consult with Pharmacist for  dosing -For fever or aces or pains- take tylenol or ibuprofen appropriate for age and weight.  * for fevers greater than 101 orally you may alternate ibuprofen and tylenol every  3 hours. -Throat lozenges if help    Christy Hawks, FNP  

## 2015-03-09 NOTE — Progress Notes (Signed)
Subjective:    Patient ID: [name], female    DOB: January 16, 1933, 81 y.o.   MRN: [id number]  Cough This is a new problem. The current episode started in the past 7 days. The problem has been waxing and waning. The problem occurs every few minutes. The cough is non-productive. Associated symptoms include chills, a fever, headaches, postnasal drip, rhinorrhea, a sore throat, shortness of breath and wheezing. Pertinent negatives include no ear congestion, ear pain, myalgias or nasal congestion. The symptoms are aggravated by lying down. Treatments tried: benardryl. The treatment provided no relief. There is no history of asthma, COPD or environmental allergies.      Review of Systems  Constitutional: Positive for fever and chills.  HENT: Positive for postnasal drip, rhinorrhea and sore throat. Negative for ear pain.   Eyes: Negative.   Respiratory: Positive for cough, shortness of breath and wheezing.   Cardiovascular: Negative.  Negative for palpitations.  Gastrointestinal: Negative.   Endocrine: Negative.   Genitourinary: Negative.   Musculoskeletal: Negative.  Negative for myalgias.  Allergic/Immunologic: Negative for environmental allergies.  Neurological: Positive for headaches.  Hematological: Negative.   Psychiatric/Behavioral: Negative.   All other systems reviewed and are negative.      Objective:   Physical Exam  Constitutional: She is oriented to person, place, and time. She appears well-developed and well-nourished. No distress.  HENT:  Head: Normocephalic and atraumatic.  Right Ear: External ear normal.  Mouth/Throat: Oropharynx is clear and moist.  Eyes: Pupils are equal, round, and reactive to light.  Neck: Normal range of motion. Neck supple. No thyromegaly present.  Cardiovascular: Normal rate, regular rhythm, normal heart sounds and intact distal pulses.   No murmur heard. Pulmonary/Chest: Effort normal. No respiratory distress. She has wheezes.    Abdominal: Soft. Bowel sounds are normal. She exhibits no distension. There is no tenderness.  Musculoskeletal: Normal range of motion. She exhibits no edema or tenderness.  Neurological: She is alert and oriented to person, place, and time. She has normal reflexes. No cranial nerve deficit.  Skin: Skin is warm and dry.  Psychiatric: She has a normal mood and affect. Her behavior is normal. Judgment and thought content normal.  Vitals reviewed.    BP 145/70 mmHg  Pulse 75  Temp(Src) 98 F (36.7 C) (Oral)  Ht 5\' 4"  (1.626 m)  Wt 146 lb 12.8 oz (66.588 kg)  BMI 25.19 kg/m2      Assessment & Plan:  1. Acute bronchiolitis due to unspecified organism -- Take meds as prescribed - Use a cool mist humidifier  -Use saline nose sprays frequently -Saline irrigations of the nose can be very helpful if done frequently.  * 4X daily for 1 week*  * Use of a nettie pot can be helpful with this. Follow directions with this* -Force fluids -For any cough or congestion  Use plain Mucinex- regular strength or max strength is fine   * Children- consult with Pharmacist for dosing -For fever or aces or pains- take tylenol or ibuprofen appropriate for age and weight.  * for fevers greater than 101 orally you may alternate ibuprofen and tylenol every  3 hours. -Throat lozenges if help - levofloxacin (LEVAQUIN) 500 MG tablet; Take 1 tablet (500 mg total) by mouth daily.  Dispense: 7 tablet; Refill: 0 - HYDROcodone-homatropine (HYCODAN) 5-1.5 MG/5ML syrup; Take 5 mLs by mouth every 8 (eight) hours as needed for cough.  Dispense: 120 mL; Refill: 0 - methylPREDNISolone acetate (DEPO-MEDROL) injection 80 mg; Inject 1  mL (80 mg total) into the muscle once.  Evelina Dun, FNP

## 2015-05-24 ENCOUNTER — Ambulatory Visit (INDEPENDENT_AMBULATORY_CARE_PROVIDER_SITE_OTHER): Payer: Medicare Other | Admitting: Family

## 2015-05-24 ENCOUNTER — Encounter: Payer: Self-pay | Admitting: Family

## 2015-05-24 ENCOUNTER — Ambulatory Visit (INDEPENDENT_AMBULATORY_CARE_PROVIDER_SITE_OTHER): Payer: Medicare Other

## 2015-05-24 VITALS — BP 111/57 | HR 82 | Temp 97.3°F | Ht 64.0 in | Wt 145.0 lb

## 2015-05-24 DIAGNOSIS — R059 Cough, unspecified: Secondary | ICD-10-CM

## 2015-05-24 DIAGNOSIS — R05 Cough: Secondary | ICD-10-CM | POA: Diagnosis not present

## 2015-05-24 DIAGNOSIS — R0602 Shortness of breath: Secondary | ICD-10-CM

## 2015-05-24 DIAGNOSIS — K219 Gastro-esophageal reflux disease without esophagitis: Secondary | ICD-10-CM | POA: Diagnosis not present

## 2015-05-24 DIAGNOSIS — J209 Acute bronchitis, unspecified: Secondary | ICD-10-CM

## 2015-05-24 MED ORDER — AZITHROMYCIN 250 MG PO TABS: ORAL_TABLET | ORAL | Status: DC

## 2015-05-24 MED ORDER — OMEPRAZOLE 20 MG PO CPDR: 20.0000 mg | DELAYED_RELEASE_CAPSULE | Freq: Every day | ORAL | Status: DC

## 2015-05-24 NOTE — Progress Notes (Signed)
Subjective:    Patient ID: [name], female    DOB: [birth year], 82 y.o.   MRN: [id number]  PT presents to the office today with productive cough and complaints of burning in her throat. PT states she is SOB when  She starts coughing or if she walks too far. Pt states she is having some shoulder pain that is worse when she coughs. PT is worried about pneumonia. PT states the burning in her throat comes and goes and does not depend on what she eats.  Cough This is a new problem. The current episode started in the past 7 days. The problem has been unchanged. The problem occurs every few minutes. The cough is non-productive. Associated symptoms include chills, heartburn and shortness of breath (at times). Pertinent negatives include no ear congestion, ear pain, fever, headaches or wheezing. The symptoms are aggravated by lying down. She has tried nothing for the symptoms. The treatment provided no relief. There is no history of asthma or COPD.  Gastroesophageal Reflux She complains of belching, coughing and heartburn. She reports no wheezing. This is a new problem. The current episode started more than 1 month ago. The problem has been unchanged. The heartburn wakes her from sleep. The symptoms are aggravated by lying down.      Review of Systems  Constitutional: Positive for chills. Negative for fever.  HENT: Negative.  Negative for ear pain.   Eyes: Negative.   Respiratory: Positive for cough and shortness of breath (at times). Negative for wheezing.   Cardiovascular: Negative.  Negative for palpitations.  Gastrointestinal: Positive for heartburn.  Endocrine: Negative.   Genitourinary: Negative.   Musculoskeletal: Negative.   Neurological: Negative.  Negative for headaches.  Hematological: Negative.   Psychiatric/Behavioral: Negative.   All other systems reviewed and are negative.      Objective:   Physical Exam  Constitutional: She is oriented to person, place, and time. She  appears well-developed and well-nourished. No distress.  HENT:  Head: Normocephalic and atraumatic.  Right Ear: External ear normal.  Left Ear: External ear normal.  Mouth/Throat: Oropharynx is clear and moist.  Nasal passage erythemas with mild swelling    Eyes: Pupils are equal, round, and reactive to light.  Neck: Normal range of motion. Neck supple. No thyromegaly present.  Cardiovascular: Normal rate, regular rhythm, normal heart sounds and intact distal pulses.   No murmur heard. Pulmonary/Chest: Effort normal. No respiratory distress. She has wheezes.  Abdominal: Soft. Bowel sounds are normal. She exhibits no distension. There is no tenderness.  Musculoskeletal: Normal range of motion. She exhibits no edema or tenderness.  Neurological: She is alert and oriented to person, place, and time. She has normal reflexes. No cranial nerve deficit.  Skin: Skin is warm and dry.  Psychiatric: She has a normal mood and affect. Her behavior is normal. Judgment and thought content normal.  Vitals reviewed.   BP 111/57 mmHg  Pulse 82  Temp(Src) 97.3 F (36.3 C) (Oral)  Ht 5\' 4"  (1.626 m)  Wt 145 lb (65.772 kg)  BMI 24.88 kg/m2  SpO2 99%  Chest X-ray -WNL Preliminary reading by Evelina Dun, FNP [facility]      Assessment & Plan:  1. SOB (shortness of breath) - DG Chest 2 View; Future  2. Cough - DG Chest 2 View; Future  3. Acute bronchitis, unspecified organism -- Take meds as prescribed - Use a cool mist humidifier  -Use saline nose sprays frequently -Saline irrigations of the nose can be very  helpful if done frequently.  * 4X daily for 1 week*  * Use of a nettie pot can be helpful with this. Follow directions with this* -Force fluids -For any cough or congestion  Use plain Mucinex- regular strength or max strength is fine   * Children- consult with Pharmacist for dosing -For fever or aces or pains- take tylenol or ibuprofen appropriate for age and weight.  * for fevers  greater than 101 orally you may alternate ibuprofen and tylenol every  3 hours. -Throat lozenges if help - azithromycin (ZITHROMAX Z-PAK) 250 MG tablet; As directed  Dispense: 1 each; Refill: 0  4. Gastroesophageal reflux disease, esophagitis presence not specified -PT started on Prilosec 20 mg today -Diet discussed -RTO in 2 weeks  - omeprazole (PRILOSEC) 20 MG capsule; Take 1 capsule (20 mg total) by mouth daily.  Dispense: 30 capsule; Refill: [place], FNP

## 2015-05-24 NOTE — Patient Instructions (Addendum)
Bronquitis aguda (Acute Bronchitis) La bronquitis es una inflamacin de las vas respiratorias que se extienden desde la trquea [facility] pulmones (bronquios). La inflamacin produce la formacin de mucosidad. Esto produce tos, que es el sntoma ms frecuente de la bronquitis.  Cuando la bronquitis es [place], generalmente comienza de [place] sbita y desaparece luego de un par de semanas. El hbito de fumar, las alergias y el asma pueden empeorar la bronquitis. Los episodios repetidos de bronquitis pueden causar ms problemas pulmonares.  CAUSAS La causa ms frecuente de bronquitis aguda es el mismo virus que produce el resfro. El virus puede propagarse de Ardelia Mems persona a la otra (contagioso) a travs de la tos y los estornudos, y al tocar objetos contaminados. [place].  Cristy Hilts.  Tos con mucosidad.  Dolores [employer] cuerpo.  Congestin en el pecho.  Escalofros.  Falta de aire.  Dolor de Investment banker, operational. DIAGNSTICO  La bronquitis aguda en general se diagnostica con un examen fsico. El mdico tambin le har preguntas sobre su historia clnica. En algunos casos se indican otros estudios, como radiografas, para Clinical research associate.  TRATAMIENTO  La bronquitis aguda generalmente desaparece en un par de semanas. Con frecuencia, no es Systems analyst. Los medicamentos se indican para aliviar la fiebre o la tos. Generalmente, no es necesario el uso de antibiticos, pero pueden recetarse en ciertas ocasiones. En algunos casos, se recomienda el uso de un inhalador para mejorar la falta de aire y Aeronautical engineer tos. Un vaporizador de aire fro podr ayudarlo a [employer] bronquiales y Armed forces technical officer su eliminacin.  INSTRUCCIONES PARA EL CUIDADO EN EL HOGAR  Descanse lo suficiente.  Beba lquidos en abundancia para mantener la orina de color claro o amarillo plido (excepto que padezca una enfermedad que requiera la restriccin de lquidos). El aumento  de lquidos puede ayudar a que las secreciones respiratorias (esputo) sean menos espesas y a reducir la congestin del pecho, y Mining engineer deshidratacin.  Tome los medicamentos solamente como se lo haya indicado el mdico.  Si le recetaron antibiticos, asegrese de terminarlos, incluso si comienza a sentirse mejor.  Evite fumar o aspirar el humo de otros fumadores. La exposicin al humo del cigarrillo o a irritantes qumicos har que la bronquitis empeore. Si fuma, considere el uso de goma de Higher education careers adviser o la aplicacin de parches en la piel que contengan nicotina para Public house manager los sntomas de abstinencia. Si deja de fumar, sus pulmones se curarn ms rpido.  Reduzca la probabilidad de otro episodio de bronquitis aguda lavando sus manos con frecuencia, evitando a las personas que tengan sntomas y tratando de no tocarse las manos con la boca, la nariz o los ojos.  Concurra a todas las visitas de control como se lo haya indicado el mdico. SOLICITE ATENCIN MDICA SI: Los sntomas no mejoran despus de una semana de [place].  SOLICITE ATENCIN MDICA DE INMEDIATO SI:  Comienza a tener fiebre o escalofros cada vez ms intensos.  Siente dolor en el pecho.  Le falta el aire de manera preocupante.  La flema tiene [place].  Se deshidrata.  Se desmaya o siente que va a desmayarse de forma repetida.  Tiene vmitos que se repiten.  Tiene un dolor de cabeza intenso. ASEGRESE DE QUE:   Comprende estas instrucciones.  Controlar su afeccin.  Recibir ayuda de inmediato si no mejora o si empeora.   Esta informacin no tiene Marine scientist el consejo del mdico. Asegrese de hacerle al mdico cualquier  pregunta que tenga.   Document Released: 04/07/2005 Document Revised: 04/28/2014 Elsevier Interactive Patient Education 2016 [place] meds as prescribed - Use a cool mist humidifier  -Use saline nose sprays frequently -Saline irrigations of the nose can be very  helpful if done frequently.  * 4X daily for 1 week*  * Use of a nettie pot can be helpful with this. Follow directions with this* -Force fluids -For any cough or congestion  Use plain Mucinex- regular strength or max strength is fine   * Children- consult with Pharmacist for dosing -For fever or aces or pains- take tylenol or ibuprofen appropriate for age and weight.  * for fevers greater than 101 orally you may alternate ibuprofen and tylenol every  3 hours. -Throat lozenges if help   Opciones de alimentos para pacientes con reflujo gastroesofgico - Adultos (Food Choices for Gastroesophageal Reflux Disease, Adult) Cuando se tiene reflujo gastroesofgico (ERGE), los alimentos que se ingieren y los hbitos de alimentacin son muy importantes. Elegir los alimentos adecuados puede ayudar a Public house manager las molestias ocasionadas por el [place]. [place]?  Elija las frutas, los vegetales, los cereales integrales, los productos lcteos, la carne de [place], de pescado y de ave con bajo contenido de grasas.  Limite las grasas, como los [place], los aderezos para [place], la [place], los frutos secos y Publishing copy.  Lleve un registro de las comidas para identificar los alimentos que ocasionan sntomas.  Evite los alimentos que le ocasionen reflujo. Pueden ser distintos para cada persona.  Haga comidas pequeas con frecuencia en lugar de tres comidas Kellogg.  Coma lentamente, en un clima distendido.  Limite el consumo de alimentos fritos.  Cocine los alimentos utilizando mtodos que no sean la fritura.  Evite el consumo alcohol.  Evite beber grandes cantidades de lquidos con las comidas.  Evite agacharse o recostarse hasta despus de 2 o 3horas de haber comido. QU ALIMENTOS NO SE RECOMIENDAN? Los siguientes son algunos alimentos y bebidas que pueden empeorar los sntomas: Astronomer. Jugo de tomate. Salsa de tomate y espagueti. Ajes.  [place] y [place]. Rbano picante. Frutas [place], pomelos y limn (fruta y [place]). Carnes Carnes de [place], de pescado y de ave con gran contenido de grasas. Esto incluye los perros calientes, las [place], el [place], la salchicha, el salame y el tocino. Lcteos [place] entera y [place]. [employer]. Crema. [place]. Hel[place]. Queso crema.  Bebidas Caf y t negro, con o sin cafena Bebidas gaseosas o energizantes. Condimentos Salsa picante. Salsa barbacoa.  Dulces/postres Chocolate y cacao. [place]. Menta y mentol. Grasas y [employer] con alto contenido de grasas, incluidas las papas fritas. Otros [place]. Especias picantes, como la [employer], la pimienta blanca, la pimienta roja, la pimienta de cayena, el curry en [place], los clavos de [place], el jengibre y el [place] en polvo. Los artculos mencionados arriba pueden no ser [employer] de las bebidas y los alimentos que se Higher education careers adviser. Comunquese con el nutricionista para recibir ms informacin.   Esta informacin no tiene Marine scientist el consejo del mdico. Asegrese de hacerle al mdico cualquier pregunta que tenga.   Document Released: 01/15/2005 Document Revised: 04/28/2014 Elsevier Interactive Patient Education 2016 [place], [place]

## 2015-06-08 ENCOUNTER — Ambulatory Visit: Payer: Medicare Other | Admitting: Pharmacist

## 2015-06-18 ENCOUNTER — Encounter: Payer: Self-pay | Admitting: Family Medicine

## 2015-06-18 ENCOUNTER — Ambulatory Visit (INDEPENDENT_AMBULATORY_CARE_PROVIDER_SITE_OTHER): Payer: Medicare Other | Admitting: Family Medicine

## 2015-06-18 DIAGNOSIS — R220 Localized swelling, mass and lump, head: Secondary | ICD-10-CM | POA: Diagnosis not present

## 2015-06-18 MED ORDER — LORATADINE 10 MG PO TBDP: 10.0000 mg | ORAL_TABLET | Freq: Every day | ORAL | Status: DC

## 2015-06-18 NOTE — Progress Notes (Addendum)
Subjective:   [name] is a 82 y.o. female who presents for   History of present illness She has been having intermittent feelings of swelling of her tongue and difficulty talking about having occurred about every 2 weeks over the past 6 months. She has seen a neurologist for this once already. She also describes swelling and itchy sensation on her face. She is not having difficulty talking currently. These episodes last about a day and a half to 2 days and then they returned to normal. She is taking Benadryl with that has not helped.  Review of Systems:  Review of Systems  Constitutional: Negative for fever and chills.  HENT: Negative for ear pain and tinnitus.   Eyes: Negative for blurred vision and pain.  Respiratory: Negative for cough, shortness of breath and wheezing.   Cardiovascular: Negative for chest pain, palpitations and leg swelling.  Gastrointestinal: Negative for abdominal pain, diarrhea, constipation, blood in stool and melena.  Genitourinary: Negative for dysuria and hematuria.  Musculoskeletal: Negative for myalgias, back pain and joint pain.  Skin: Negative for rash.  Neurological: Negative for dizziness, sensory change, focal weakness, weakness and headaches.  Psychiatric/Behavioral: Negative for depression and suicidal ideas.     Cardiac Risk Factors include: advanced age (>49men, >5 women);hypertension;sedentary lifestyle     Objective:     Vitals: BP 130/79 mmHg  Pulse 69  Temp(Src) 99 F (37.2 C) (Oral)  Ht 5\' 4"  (1.626 m)  Wt 145 lb 12.8 oz (66.134 kg)  BMI 25.01 kg/m2  Tobacco History  Smoking status  . Never Smoker   Smokeless tobacco  . Not on file     Counseling given: Not Answered   Past Medical History  Diagnosis Date  . Unsteady gait   . Arthritis of right foot   . Palpitations   . Hx: UTI (urinary tract infection)    Past Surgical History  Procedure Laterality Date  . None     Family History  Problem Relation Age of  Onset  . Heart attack Mother 69    Died in [place]   History  Sexual Activity  . Sexual Activity: Not on file    Outpatient Encounter Prescriptions as of 06/18/2015  Medication Sig  . lisinopril (PRINIVIL,ZESTRIL) 20 MG tablet Take 1 tablet (20 mg total) by mouth daily.  . VOLTAREN 1 % GEL Apply to R ankle 3 to 4 times daily as needed for pain  . [DISCONTINUED] aspirin EC 81 MG tablet Take 81 mg by mouth daily. Reported on 05/24/2015  . [DISCONTINUED] azithromycin (ZITHROMAX Z-PAK) 250 MG tablet As directed  . [DISCONTINUED] fluticasone (FLONASE) 50 MCG/ACT nasal spray Place 2 sprays into both nostrils daily. (Patient not taking: Reported on 05/24/2015)  . [DISCONTINUED] Ibuprofen (ADVIL) 200 MG CAPS Take 200 mg by mouth as needed (pain).   . [DISCONTINUED] omeprazole (PRILOSEC) 20 MG capsule Take 1 capsule (20 mg total) by mouth daily.  . [DISCONTINUED] traZODone (DESYREL) 50 MG tablet Take 0.5 tablets (25 mg total) by mouth at bedtime as needed for sleep. toma una mitad de una pastilla por la noche (Patient not taking: Reported on 05/24/2015)   No facility-administered encounter medications on file as of 06/18/2015.   Activities of Daily Living In your present state of health, do you have any difficulty performing the following activities: 06/18/2015 02/21/2015  Hearing? - N  Vision? - N  Difficulty concentrating or making decisions? - N  Walking or climbing stairs? - Y  Dressing or  bathing? N N  Doing errands, shopping? N N  Preparing Food and eating ? N -  Using the Toilet? N -  In the past six months, have you accidently leaked urine? N -  Do you have problems with loss of bowel control? N -  Managing your Medications? N -  Managing your Finances? N -  Housekeeping or managing your Housekeeping? N -    Patient Care Team: Worthy Rancher, MD as PCP - General (Family Medicine)    Assessment:     Exercise Activities and Dietary recommendations Current Exercise Habits:: The  patient does not participate in regular exercise at present  Goals    None     Fall Risk Fall Risk  06/18/2015 05/24/2015 01/05/2015 12/08/2014 09/28/2013  Falls in the past year? No Yes Yes No No  Number falls in past yr: - 1 1 - -  Injury with Fall? - No Yes - -   Depression Screen PHQ 2/9 Scores 06/18/2015 05/24/2015 01/05/2015 12/08/2014  PHQ - 2 Score 0 0 0 0     Cognitive Testing MMSE - Mini Mental State Exam 06/18/2015 01/05/2015  Orientation to time 5 5  Orientation to Place 5 5  Registration 3 2  Attention/ Calculation 5 5  Recall 3 3  Language- name 2 objects 2 2  Language- repeat 1 1  Language- follow 3 step command 3 3  Language- read & follow direction 1 1  Write a sentence 1 1  Copy design 1 1  Total score 30 29    There is no immunization history on file for this patient. Screening Tests Health Maintenance  Topic Date Due  . DEXA SCAN  04/03/2000  . INFLUENZA VACCINE  07/20/2015 (Originally 11/20/2014)  . ZOSTAVAX  12/16/2015 (Originally 04/04/1995)  . TETANUS/TDAP  12/16/2015 (Originally 04/03/1954)  . PNA vac Low Risk Adult (1 of 2 - PCV13) 12/16/2015 (Originally 04/03/2000)      Plan:     Problem List Items Addressed This Visit    None    Visit Diagnoses    Routine history and physical examination of adult    -  Primary    Tongue swelling        intermittent tongue swelling, and numbness, send to neuro and allergist.    Relevant Medications    loratadine (CLARITIN REDITABS) 10 MG dissolvable tablet    Other Relevant Orders    Ambulatory referral to Neurology    Ambulatory referral to Allergy       During the course of the visit the patient was educated and counseled about the following appropriate screening and preventive services:   Vaccines to include Pneumoccal, Influenza, Hepatitis B, Td, Zostavax, HCV  Electrocardiogram  Cardiovascular Disease  Colorectal cancer screening  Bone density screening  Diabetes screening  Glaucoma  screening  Mammography/PAP  Nutrition counseling   Patient Instructions (the written plan) was given to the patient.   Worthy Rancher, MD  06/18/2015

## 2015-06-20 ENCOUNTER — Other Ambulatory Visit: Payer: Self-pay | Admitting: *Deleted

## 2015-06-20 MED ORDER — LISINOPRIL 20 MG PO TABS: 20.0000 mg | ORAL_TABLET | Freq: Every day | ORAL | Status: DC

## 2015-06-21 NOTE — Addendum Note (Signed)
Addended by: Caryl Pina on: 06/21/2015 08:34 PM   Modules accepted: Level of Service

## 2015-06-27 ENCOUNTER — Ambulatory Visit (INDEPENDENT_AMBULATORY_CARE_PROVIDER_SITE_OTHER): Payer: Medicare Other | Admitting: Neurology

## 2015-06-27 ENCOUNTER — Encounter: Payer: Self-pay | Admitting: Neurology

## 2015-06-27 VITALS — BP 142/73 | HR 66 | Ht 64.0 in | Wt 145.0 lb

## 2015-06-27 DIAGNOSIS — R220 Localized swelling, mass and lump, head: Secondary | ICD-10-CM

## 2015-06-27 DIAGNOSIS — R799 Abnormal finding of blood chemistry, unspecified: Secondary | ICD-10-CM | POA: Diagnosis not present

## 2015-06-27 DIAGNOSIS — R5383 Other fatigue: Secondary | ICD-10-CM | POA: Diagnosis not present

## 2015-06-27 NOTE — Progress Notes (Signed)
PATIENT: [name] DOB: 30-Jun-1932  Chief Complaint  Patient presents with  . Swelling in Tongue    She is here, with an interpreter, to have her tongue swelling (causing difficulty with speaking) evaluated.  She is also concerned about dizziness and vision changes that only occur when her tongue is swollen.  She was unable to complete her eye exam today because all the letters were blurry.  Symptoms started in September 2016.     HISTORICAL  [name] is a 82 yo RH female, seen in refer by her primary care physician Dr. Fransisca Kaufmann Dettinger for evaluation of Tongue swelling, she is accompanied by her interpreter at today's clinical visit, she only speaks Spanish,  She had past medical history of hypertension, denied previous history of significant allergy, mild gait difficulty due to right foot fracture, surgery  Since September 2016, she began to experience recurrent episode of sudden onset tongue swelling, it usually happened in the middle of the night around midnight to 1 AM woke her up from sleep, she noticed at the beginning it was the right tongue swelling, difficulty talking, blurry vision, lasting for couple hours, gradually resolved, it happened every 1-2 weeks, initially mainly involving the right tongue, in recent episode, she also had a flareup of the left tongue, she also complains of mild difficulty breathing, has to sit up in the chair, there was no clear trigger notice,  Over the past few months, she has tried different antihistamine treatment, with unsure benefit, also reported ENT evaluations, there was no significant abnormality noticed.  I have personally reviewed MRI brain November 2016: Mild supratentorium small vessel disease, no acute abnormality, polypoid up to 1.4 cm soft tissue lesion in the midline at the nasopharynx, not typical in appearance for Tornwaldt cyst, but appearance does favor benign etiology   MRA of the brain showed mild intracranial  atherosclerotic disease, CT of cervical soft tissues showed edema status changes at the right base of tongue, right submandibular gland, extending into the anterior tongue, this appears to be inflammatory process, asymmetric enhancement and thickening of the mucous at the right base of the tongue extending to the vallecular Edematous changes at the right base of tongue  Laboratory evaluation showed normal CBC, CMP with exception of mild elevated glucose 147, normal free T4, T3,  REVIEW OF SYSTEMS: Full 14 system review of systems performed and notable only for fatigue, chill, weight loss, blurry vision, loss of vision, shortness of breath, cough, wheezing, flushing, joint pain, cramps, allergy, runny nose, skin sensitivity, insomnia, depression, not enough sleep.  ALLERGIES: No Known Allergies  HOME MEDICATIONS: Current Outpatient Prescriptions  Medication Sig Dispense Refill  . lisinopril (PRINIVIL,ZESTRIL) 20 MG tablet Take 1 tablet (20 mg total) by mouth daily. 30 tablet 5  . loratadine (CLARITIN REDITABS) 10 MG dissolvable tablet Take 1 tablet (10 mg total) by mouth daily. 30 tablet 2  . VOLTAREN 1 % GEL Apply to R ankle 3 to 4 times daily as needed for pain 100 g 2   No current facility-administered medications for this visit.    PAST MEDICAL HISTORY: Past Medical History  Diagnosis Date  . Unsteady gait   . Arthritis of right foot   . Palpitations   . Hx: UTI (urinary tract infection)     PAST SURGICAL HISTORY: Past Surgical History  Procedure Laterality Date  . None      FAMILY HISTORY: Family History  Problem Relation Age of Onset  . Heart attack Mother  44    Died in [place]    SOCIAL HISTORY:  Social History   Social History  . Marital Status: Married    Spouse Name: N/A  . Number of Children: 4  . Years of Education: 4th grade   Occupational History  . Homemaker    Social History Main Topics  . Smoking status: Never Smoker   . Smokeless tobacco: Not  on file  . Alcohol Use: No  . Drug Use: No  . Sexual Activity: Not on file   Other Topics Concern  . Not on file   Social History Narrative   Lives at home with son and husband.     1 cup coffee per day.   Right-handed.        PHYSICAL EXAM   Filed Vitals:   06/27/15 1413  BP: 142/73  Pulse: 66  Height: 5\' 4"  (1.626 m)  Weight: 145 lb (65.772 kg)    Not recorded      Body mass index is 24.88 kg/(m^2).  PHYSICAL EXAMNIATION:  Gen: NAD, conversant, well nourised, obese, well groomed                     Cardiovascular: Regular rate rhythm, no peripheral edema, warm, nontender. Eyes: Conjunctivae clear without exudates or hemorrhage Neck: Supple, no carotid bruise. Pulmonary: Clear to auscultation bilaterally   NEUROLOGICAL EXAM:  MENTAL STATUS: Speech:    Speech is normal; fluent and spontaneous with normal comprehension.  Cognition:     Orientation to time, place and person     Normal recent and remote memory     Normal Attention span and concentration     Normal Language, naming, repeating,spontaneous speech     Fund of knowledge   CRANIAL NERVES: CN II: Visual fields are full to confrontation. Fundoscopic exam is normal with sharp discs and no vascular changes. Pupils are round equal and briskly reactive to light. CN III, IV, VI: extraocular movement are normal. No ptosis. CN V: Facial sensation is intact to pinprick in all 3 divisions bilaterally. Corneal responses are intact.  CN VII: Face is symmetric with normal eye closure and smile. CN VIII: Hearing is normal to rubbing fingers CN IX, X: Palate elevates symmetrically. Phonation is normal. CN XI: Head turning and shoulder shrug are intact CN XII: Tongue is midline with normal movements and no atrophy.  MOTOR: There is no pronator drift of out-stretched arms. Muscle bulk and tone are normal. Muscle strength is normal.  REFLEXES: Reflexes are 2+ and symmetric at the biceps, triceps, knees, and  ankles. Plantar responses are flexor.  SENSORY: Intact to light touch, pinprick, position sense, and vibration sense are intact in fingers and toes.  COORDINATION: Rapid alternating movements and fine finger movements are intact. There is no dysmetria on finger-to-nose and heel-knee-shin.    GAIT/STANCE: She needs to push up on chair arm to get up from seated position, antalgic, rely on her walker, mildly unsteady.   DIAGNOSTIC DATA (LABS, IMAGING, TESTING) - I reviewed patient records, labs, notes, testing and imaging myself where available.   ASSESSMENT AND PLAN  [name] is a 82 y.o. female   Intermittent right tongue swelling  Unsure radiology, there is no neurological cause to explain her lateralized tongue swelling, likely inflammatory etiology   Laboratory evaluations, including inflammatory markers   Continue to observe her symptoms, follow-up with ENT   Marcial Pacas, M.D. Ph.D.  [facility] [address] [place], [place] [address]  Ph: [phone] Fax: [phone]  [id number] A Dettinger, MD

## 2015-06-28 ENCOUNTER — Telehealth: Payer: Self-pay | Admitting: *Deleted

## 2015-06-28 LAB — VITAMIN B12: Vitamin B-12: 362 pg/mL (ref 211–946)

## 2015-06-28 LAB — RPR: RPR Ser Ql: NONREACTIVE

## 2015-06-28 LAB — C-REACTIVE PROTEIN: CRP: 1.2 mg/L (ref 0.0–4.9)

## 2015-06-28 LAB — ANA W/REFLEX IF POSITIVE: ANA: NEGATIVE

## 2015-06-28 LAB — SEDIMENTATION RATE: Sed Rate: 10 mm/hr (ref 0–40)

## 2015-06-28 NOTE — Telephone Encounter (Signed)
Left message that results are normal 

## 2015-06-28 NOTE — Telephone Encounter (Signed)
-----   Message from Marcial Pacas, MD sent at 06/28/2015  3:21 PM EST ----- Please call patient for normal laboratory result

## 2015-07-17 ENCOUNTER — Telehealth: Payer: Self-pay

## 2015-07-17 NOTE — Telephone Encounter (Signed)
Please call   I need a referral for my mother

## 2015-07-20 NOTE — Telephone Encounter (Signed)
Spoke with patient's son, he said his mother had originally decided against seeing the Allergist and Neurologist for tongue swelling, but she has now decided she would like to go ahead with these appointments.  Can you please go ahead and do these referrals?

## 2015-07-23 ENCOUNTER — Other Ambulatory Visit: Payer: Self-pay

## 2015-07-23 DIAGNOSIS — R220 Localized swelling, mass and lump, head: Secondary | ICD-10-CM

## 2015-07-30 ENCOUNTER — Other Ambulatory Visit: Payer: Self-pay

## 2015-07-30 DIAGNOSIS — R220 Localized swelling, mass and lump, head: Secondary | ICD-10-CM

## 2015-08-15 DIAGNOSIS — H04123 Dry eye syndrome of bilateral lacrimal glands: Secondary | ICD-10-CM | POA: Diagnosis not present

## 2015-08-23 ENCOUNTER — Encounter: Payer: Self-pay | Admitting: Family Medicine

## 2015-08-23 ENCOUNTER — Ambulatory Visit (INDEPENDENT_AMBULATORY_CARE_PROVIDER_SITE_OTHER): Payer: Medicare Other | Admitting: Family Medicine

## 2015-08-23 VITALS — BP 134/75 | HR 66 | Temp 97.9°F | Ht 64.0 in | Wt 145.0 lb

## 2015-08-23 DIAGNOSIS — R220 Localized swelling, mass and lump, head: Secondary | ICD-10-CM

## 2015-08-23 DIAGNOSIS — I10 Essential (primary) hypertension: Secondary | ICD-10-CM | POA: Diagnosis not present

## 2015-08-23 MED ORDER — LORATADINE 10 MG PO TBDP: 10.0000 mg | ORAL_TABLET | Freq: Every day | ORAL | Status: DC

## 2015-08-23 NOTE — Progress Notes (Signed)
BP 134/75 mmHg  Pulse 66  Temp(Src) 97.9 F (36.6 C) (Oral)  Ht 5\' 4"  (1.626 m)  Wt 145 lb (65.772 kg)  BMI 24.88 kg/m2   Subjective:    Patient ID: [name], female    DOB: 24-Oct-1932, 82 y.o.   MRN: [id number]  HPI: [name] is a 82 y.o. female presenting on 08/23/2015 for Difficulty swallowing, tongue swelling, right sided facial n   HPI Sensation of tongue swelling Patient has still been having this intermittent sensation of tongue swelling and she feels like he gets sicker and her speech becomes slurred. She denies any shortness of breath or throat tightening. She denies any swelling in her lips or mouth but just feels like one side of her head and goes numb and feel swollen. She went and saw the neurologist and he did not think it was anything neurological in origin and recommended that she go see an allergist and an ENT. She denies any fevers or chills or cough or shortness of breath. This tongue swelling is been going on for at least a few months now.  Hypertension recheck Patient is here for recheck of hypertension. She is currently on lisinopril 20 mg. Her blood pressure today is 134/75. Patient denies headaches, blurred vision, chest pains, shortness of breath, or weakness. Denies any side effects from medication and is content with current medication.   Relevant past medical, surgical, family and social history reviewed and updated as indicated. Interim medical history since our last visit reviewed. Allergies and medications reviewed and updated.  Review of Systems  Constitutional: Negative for fever and chills.  HENT: Negative for congestion, drooling, ear discharge, ear pain, rhinorrhea, sore throat, trouble swallowing and voice change.   Eyes: Negative for redness and visual disturbance.  Respiratory: Negative for cough, chest tightness and shortness of breath.   Cardiovascular: Negative for chest pain and leg swelling.  Genitourinary: Negative for dysuria  and difficulty urinating.  Musculoskeletal: Negative for back pain and gait problem.  Skin: Negative for rash.  Neurological: Negative for dizziness, light-headedness and headaches.  Psychiatric/Behavioral: Negative for behavioral problems and agitation.  All other systems reviewed and are negative.   Per HPI unless specifically indicated above     Medication List       This list is accurate as of: 08/23/15 10:16 AM.  Always use your most recent med list.               lisinopril 20 MG tablet  Commonly known as:  PRINIVIL,ZESTRIL  Take 1 tablet (20 mg total) by mouth daily.     loratadine 10 MG dissolvable tablet  Commonly known as:  CLARITIN REDITABS  Take 1 tablet (10 mg total) by mouth daily.     VOLTAREN 1 % Gel  Generic drug:  diclofenac sodium  Apply to R ankle 3 to 4 times daily as needed for pain           Objective:    BP 134/75 mmHg  Pulse 66  Temp(Src) 97.9 F (36.6 C) (Oral)  Ht 5\' 4"  (1.626 m)  Wt 145 lb (65.772 kg)  BMI 24.88 kg/m2  Wt Readings from Last 3 Encounters:  08/23/15 145 lb (65.772 kg)  06/27/15 145 lb (65.772 kg)  06/18/15 145 lb 12.8 oz (66.134 kg)    Physical Exam  Constitutional: She is oriented to person, place, and time. She appears well-developed and well-nourished. No distress.  HENT:  Right Ear: External ear normal.  Left Ear:  External ear normal.  Mouth/Throat: Uvula is midline, oropharynx is clear and moist and mucous membranes are normal. No oropharyngeal exudate, posterior oropharyngeal edema or posterior oropharyngeal erythema.  Eyes: Conjunctivae and EOM are normal. Pupils are equal, round, and reactive to light.  Neck: Neck supple. No thyromegaly present.  Cardiovascular: Normal rate, regular rhythm, normal heart sounds and intact distal pulses.   No murmur heard. Pulmonary/Chest: Effort normal and breath sounds normal. No respiratory distress. She has no wheezes.  Musculoskeletal: Normal range of motion. She  exhibits no edema or tenderness.  Lymphadenopathy:    She has no cervical adenopathy.  Neurological: She is alert and oriented to person, place, and time. Coordination normal.  Skin: Skin is warm and dry. No rash noted. She is not diaphoretic.  Psychiatric: She has a normal mood and affect. Her behavior is normal.  Nursing note and vitals reviewed.  Results for orders placed or performed in visit on 06/27/15  Vitamin B12  Result Value Ref Range   Vitamin B-12 362 211 - 946 pg/mL  RPR  Result Value Ref Range   RPR Ser Ql Non Reactive Non Reactive  C-reactive protein  Result Value Ref Range   CRP 1.2 0.0 - 4.9 mg/L  Sedimentation rate  Result Value Ref Range   Sed Rate 10 0 - 40 mm/hr  ANA w/Reflex if Positive  Result Value Ref Range   Anit Nuclear Antibody(ANA) Negative Negative      Assessment & Plan:   Problem List Items Addressed This Visit      Cardiovascular and Mediastinum   HTN (hypertension)     Other   Tongue swelling - Primary   Relevant Medications   loratadine (CLARITIN REDITABS) 10 MG dissolvable tablet   Other Relevant Orders   Ambulatory referral to ENT   Ambulatory referral to Allergy      Follow up plan: Return if symptoms worsen or fail to improve.  Counseling provided for all of the vaccine components Orders Placed This Encounter  Procedures  . Ambulatory referral to ENT  . Ambulatory referral to [place], MD [facility] 08/23/2015, 10:16 AM

## 2015-08-28 ENCOUNTER — Ambulatory Visit: Payer: Medicare Other | Admitting: Neurology

## 2015-08-30 DIAGNOSIS — M25572 Pain in left ankle and joints of left foot: Secondary | ICD-10-CM | POA: Diagnosis not present

## 2015-08-30 DIAGNOSIS — M79671 Pain in right foot: Secondary | ICD-10-CM | POA: Diagnosis not present

## 2015-08-30 DIAGNOSIS — M79672 Pain in left foot: Secondary | ICD-10-CM | POA: Diagnosis not present

## 2015-08-30 DIAGNOSIS — M25571 Pain in right ankle and joints of right foot: Secondary | ICD-10-CM | POA: Diagnosis not present

## 2015-09-14 DIAGNOSIS — D497 Neoplasm of unspecified behavior of endocrine glands and other parts of nervous system: Secondary | ICD-10-CM | POA: Diagnosis not present

## 2015-09-14 DIAGNOSIS — R220 Localized swelling, mass and lump, head: Secondary | ICD-10-CM | POA: Diagnosis not present

## 2015-09-14 DIAGNOSIS — T783XXD Angioneurotic edema, subsequent encounter: Secondary | ICD-10-CM | POA: Diagnosis not present

## 2015-10-04 ENCOUNTER — Ambulatory Visit (INDEPENDENT_AMBULATORY_CARE_PROVIDER_SITE_OTHER): Payer: Medicare Other | Admitting: Family Medicine

## 2015-10-04 VITALS — BP 153/72 | HR 58 | Temp 97.5°F | Ht 64.0 in | Wt 146.0 lb

## 2015-10-04 DIAGNOSIS — I10 Essential (primary) hypertension: Secondary | ICD-10-CM | POA: Diagnosis not present

## 2015-10-04 DIAGNOSIS — R220 Localized swelling, mass and lump, head: Secondary | ICD-10-CM | POA: Diagnosis not present

## 2015-10-04 MED ORDER — HYDROCHLOROTHIAZIDE 12.5 MG PO TABS: 12.5000 mg | ORAL_TABLET | Freq: Every day | ORAL | Status: DC

## 2015-10-04 NOTE — Progress Notes (Signed)
BP 153/72 mmHg  Pulse 58  Temp(Src) 97.5 F (36.4 C) (Oral)  Ht 5\' 4"  (1.626 m)  Wt 146 lb (66.225 kg)  BMI 25.05 kg/m2   Subjective:    Patient ID: [name], female    DOB: 04/23/1932, 82 y.o.   MRN: [id number]  HPI: [name] is a 82 y.o. female presenting on 10/04/2015 for Follow-up   HPI Hypertension and tongue swelling Patient comes in for hypertension recheck and a follow-up visit on tongue swelling today. She went and saw the subspecialists and he thought that her tongue swelling could be due to the lisinopril and told her to stop it. This was a few days ago and that is why her blood pressure is elevated. She says the tongue swelling has been improved since she's been off of it but he also has her on high-dose prednisone currently. She denies any headaches or chest pain or shortness of breath or wheezing.  Relevant past medical, surgical, family and social history reviewed and updated as indicated. Interim medical history since our last visit reviewed. Allergies and medications reviewed and updated.  Review of Systems  Constitutional: Negative for fever and chills.  HENT: Negative for congestion, ear discharge and ear pain.   Eyes: Negative for redness and visual disturbance.  Respiratory: Negative for chest tightness and shortness of breath.   Cardiovascular: Negative for chest pain and leg swelling.  Genitourinary: Negative for dysuria and difficulty urinating.  Musculoskeletal: Negative for back pain and gait problem.  Skin: Negative for rash.  Neurological: Negative for light-headedness and headaches.  Psychiatric/Behavioral: Negative for behavioral problems and agitation.  All other systems reviewed and are negative.   Per HPI unless specifically indicated above     Medication List       This list is accurate as of: 10/04/15  4:21 PM.  Always use your most recent med list.               hydrochlorothiazide 12.5 MG tablet  Commonly known as:   HYDRODIURIL  Take 1 tablet (12.5 mg total) by mouth daily.     loratadine 10 MG dissolvable tablet  Commonly known as:  CLARITIN REDITABS  Take 1 tablet (10 mg total) by mouth daily.     methylPREDNISolone 4 MG Tbpk tablet  Commonly known as:  MEDROL DOSEPAK  See admin instructions. follow package directions     VOLTAREN 1 % Gel  Generic drug:  diclofenac sodium  Apply to R ankle 3 to 4 times daily as needed for pain           Objective:    BP 153/72 mmHg  Pulse 58  Temp(Src) 97.5 F (36.4 C) (Oral)  Ht 5\' 4"  (1.626 m)  Wt 146 lb (66.225 kg)  BMI 25.05 kg/m2  Wt Readings from Last 3 Encounters:  10/04/15 146 lb (66.225 kg)  08/23/15 145 lb (65.772 kg)  06/27/15 145 lb (65.772 kg)    Physical Exam  Constitutional: She is oriented to person, place, and time. She appears well-developed and well-nourished. No distress.  Eyes: Conjunctivae and EOM are normal. Pupils are equal, round, and reactive to light.  Neck: Neck supple. No thyromegaly present.  Cardiovascular: Normal rate, regular rhythm, normal heart sounds and intact distal pulses.   No murmur heard. Pulmonary/Chest: Effort normal and breath sounds normal. No respiratory distress. She has no wheezes.  Musculoskeletal: Normal range of motion. She exhibits no edema or tenderness.  Lymphadenopathy:    She has no  cervical adenopathy.  Neurological: She is alert and oriented to person, place, and time. Coordination normal.  Skin: Skin is warm and dry. No rash noted. She is not diaphoretic.  Psychiatric: She has a normal mood and affect. Her behavior is normal.  Nursing note and vitals reviewed.   Results for orders placed or performed in visit on 06/27/15  Vitamin B12  Result Value Ref Range   Vitamin B-12 362 211 - 946 pg/mL  RPR  Result Value Ref Range   RPR Ser Ql Non Reactive Non Reactive  C-reactive protein  Result Value Ref Range   CRP 1.2 0.0 - 4.9 mg/L  Sedimentation rate  Result Value Ref Range    Sed Rate 10 0 - 40 mm/hr  ANA w/Reflex if Positive  Result Value Ref Range   Anit Nuclear Antibody(ANA) Negative Negative      Assessment & Plan:   Problem List Items Addressed This Visit      Cardiovascular and Mediastinum   HTN (hypertension) - Primary   Relevant Medications   hydrochlorothiazide (HYDRODIURIL) 12.5 MG tablet     Other   Tongue swelling       Follow up plan: Return if symptoms worsen or fail to improve.  Counseling provided for all of the vaccine components No orders of the defined types were placed in this encounter.    Caryl Pina, MD [facility] 10/04/2015, 4:21 PM

## 2015-10-09 ENCOUNTER — Ambulatory Visit: Payer: Medicare Other | Admitting: Allergy and Immunology

## 2015-11-05 ENCOUNTER — Ambulatory Visit: Payer: Medicare Other | Admitting: Family Medicine

## 2015-11-06 ENCOUNTER — Encounter: Payer: Self-pay | Admitting: Family Medicine

## 2015-12-11 ENCOUNTER — Ambulatory Visit (INDEPENDENT_AMBULATORY_CARE_PROVIDER_SITE_OTHER): Payer: Medicare Other | Admitting: Allergy

## 2015-12-11 ENCOUNTER — Encounter: Payer: Self-pay | Admitting: Allergy

## 2015-12-11 VITALS — BP 138/78 | HR 78 | Temp 98.3°F | Ht 61.42 in | Wt 148.6 lb

## 2015-12-11 DIAGNOSIS — R220 Localized swelling, mass and lump, head: Secondary | ICD-10-CM

## 2015-12-11 DIAGNOSIS — J3089 Other allergic rhinitis: Secondary | ICD-10-CM

## 2015-12-11 DIAGNOSIS — H401131 Primary open-angle glaucoma, bilateral, mild stage: Secondary | ICD-10-CM | POA: Diagnosis not present

## 2015-12-11 DIAGNOSIS — H2513 Age-related nuclear cataract, bilateral: Secondary | ICD-10-CM | POA: Diagnosis not present

## 2015-12-11 DIAGNOSIS — T783XXA Angioneurotic edema, initial encounter: Secondary | ICD-10-CM | POA: Diagnosis not present

## 2015-12-11 DIAGNOSIS — H2511 Age-related nuclear cataract, right eye: Secondary | ICD-10-CM | POA: Diagnosis not present

## 2015-12-11 MED ORDER — LORATADINE 10 MG PO TBDP: 10.0000 mg | ORAL_TABLET | Freq: Every day | ORAL | 4 refills | Status: DC

## 2015-12-11 NOTE — Progress Notes (Signed)
New Patient Note  RE: [name] MRN: [id number] DOB: [birth year] Date of Office Visit: 12/11/2015  Referring provider: Dettinger, Fransisca Kaufmann, MD Primary care provider: Fransisca Kaufmann Dettinger, MD  Chief Complaint: tongue swelling, sneezing, congestion  History of present illness: [name] is a 82 y.o. female presenting today for consultation for tongue swelling.   She is present with her son as well as Spanish interpreter.    She started having intermittent episodes of tongue swelling in Nov 2016.  Swelling started "out of nowhere".  She reports having an episode about every 2-3 weeks.  Usually involves half the tongue when it does swell.  Swelling is resolved in less than 24hrs.  Usually starts in the middle of the night while sleeping and she'll wake up at notice and reports typically by 2 or 3pm of same day it is resolved.  She reports having several procedures done to try to figure out the cause of her swelling initially and recalls having a  scope of her nasal passage and a cyst was found incidentally in 12/16.  She was going to have the cyst removed but she had a viral illness on the day of procedure and thus was cancelled and has not had any follow-up for that.  She was told that her swelling was not related to the cyst.  She also had various testing of her ears which were normal.   When her tongue is swollen she denies trouble breathing but does report she has difficulty gargling due to the size of tongue.  Denies lip or other swelling.  Not associated with any hives or rash.  Denies any respiratory, GI or CV related symptoms.  No preceding illnesses.  She has not been able to identify any trigger besides Lisinopril use. She was on lisinopril for years before tongue swelling started.  She was advised to stop taking lisonopril which she did in May.  She has continued to have some episode of swelling.  She was changed to HCTZ but she reports dizziness with this thus she stopped taking that  as well.  She was prescribed loratidine as outlined on the medication form she provided but she has not started taking this.       She has no history of asthma, food allergy, eczema.  Does report hives with PCN.    She reports difficulty with pollens especially tree pollen with nasal congestion and itchy watery eyes.  Will take benadryl occ for this and does help.    Personally reviewed OV note fromDr. Dettinger from 08/23/2015 noting recurrent tongue swelling and htn recheck.  She was advised after this visit to start claritin and referred to allergy and ENT.      Review of systems: Review of Systems  Constitutional: Negative for chills and fever.  HENT: Negative for sore throat.   Eyes: Negative for redness.  Respiratory: Negative for cough, shortness of breath and wheezing.   Cardiovascular: Negative for chest pain.  Gastrointestinal: Negative for nausea and vomiting.  Skin: Negative for rash.  Neurological: Negative for headaches.    All other systems negative unless noted above in HPI  Past medical history: Past Medical History:  Diagnosis Date  . Arthritis of right foot   . Hx: UTI (urinary tract infection)   . Palpitations   . Unsteady gait     Past surgical history: Past Surgical History:  Procedure Laterality Date  . None      Family history:  Family History  Problem  Relation Age of Onset  . Heart attack Mother 47    Died in [place]  . Allergic rhinitis Neg Hx   . Angioedema Neg Hx   . Asthma Neg Hx   . Eczema Neg Hx   . Immunodeficiency Neg Hx   . Urticaria Neg Hx     Social history: Social History   Social History  . Marital status: Married    Spouse name: N/A  . Number of children: 4  . Years of education: 4th grade   Occupational History  . Homemaker    Social History Main Topics  . Smoking status: Never Smoker  . Smokeless tobacco: Not on file  . Alcohol use No  . Drug use: No    Social History Narrative   Lives at home with son and  husband in home with carpeting in bedroom.  2 calves outside home.  No pets inside home.      1 cup coffee per day.   Right-handed.       Medication List:   Medication List       Accurate as of 12/11/15  1:25 PM. Always use your most recent med list.          hydrochlorothiazide 12.5 MG tablet Commonly known as:  HYDRODIURIL Take 1 tablet (12.5 mg total) by mouth daily.   loratadine 10 MG dissolvable tablet Commonly known as:  CLARITIN REDITABS Take 1 tablet (10 mg total) by mouth daily.   methylPREDNISolone 4 MG Tbpk tablet Commonly known as:  MEDROL DOSEPAK See admin instructions. follow package directions   omeprazole 20 MG capsule Commonly known as:  PRILOSEC Take 20 mg by mouth as needed.   VOLTAREN 1 % Gel Generic drug:  diclofenac sodium Apply to R ankle 3 to 4 times daily as needed for pain       Known medication allergies: Allergies  Allergen Reactions  . Penicillin G Hives     Physical examination: Blood pressure 138/78, pulse 78, temperature 98.3 F (36.8 C), temperature source Oral, height 5' 1.42" (1.56 m), weight 148 lb 9.6 oz (67.4 kg), SpO2 95 %.  General: Alert, interactive, in no acute distress. HEENT: TMs pearly gray, turbinates minimally edematous without discharge, post-pharynx non erythematous. No tongue or uvula edema Neck: Supple without lymphadenopathy. Lungs: Clear to auscultation without wheezing, rhonchi or rales. {no increased work of breathing. CV: Normal S1, S2 without murmurs. Abdomen: Nondistended, nontender. Skin: Warm and dry, without lesions or rashes. Extremities:  No clubbing, cyanosis or edema. Neuro:   Grossly intact.  Diagnositics/Labs: Labs: reviewed from 06/27/15 ANA neg, VitB12 362 wnl, RPR Nr, CRP 1.2 wnl, ESR 10 wnl.   Reviewed from 02/2015 CBC wnl, CMP only remarkable for elev glucose, fT4 0.94 wnl,    Assessment and plan:   1. Angioedema, initial encounter Most likely related to Lisinopril use which was  stopped.  Never take lisinopril or ACE-inhibitor medication in the future.    Check C3/C4 levels, C1 esterase inhibitor level and activity to rule out HAE.    Start Claritin daily.  If you have more swelling episodes increase Claritin to twice a day.  Let us know if you continue to have breakthrough swelling.   2. Rhinitis, presumed allergic Take Claritin daily as above.   Obtain environmental IgE panel  Follow-up 3-4 months  I appreciate the opportunity to take part in Ellyse's care. Please do not hesitate to contact me with questions.  Sincerely,   Prudy Feeler, MD Allergy/Immunology [facility]

## 2015-12-11 NOTE — Patient Instructions (Signed)
1. Angioedema, initial encounter Most likely related to Lisinopril use which was stopped.  Never take lisinopril or ACE-inhibitor medication in the future. Check C3/C4 levels, C1 esterase inhibitor level and activity.   Start Claritin daily.  If you have more swelling episodes increase Claritin to twice a day.    2. Allergic rhinitis Take Claritin as above.   Will obtain aeroallergen panel zone 3.   Follow-up 3-4 months

## 2015-12-12 LAB — C1 ESTERASE INHIBITOR, FUNCTIONAL: C1INH Functional/C1INH Total MFr SerPl: 83 %mean normal

## 2015-12-12 LAB — C3 AND C4
Complement C3, Serum: 120 mg/dL (ref 82–167)
Complement C4, Serum: 29 mg/dL (ref 14–44)

## 2015-12-12 LAB — C1 ESTERASE INHIBITOR: C1 ESTERASE INH: 33 mg/dL (ref 21–39)

## 2015-12-20 ENCOUNTER — Encounter: Payer: Self-pay | Admitting: *Deleted

## 2015-12-27 NOTE — Patient Instructions (Signed)
Your procedure is scheduled on:  01/01/2016               Report to [facility] at  8:00   AM.  Call this number if you have problems the morning of surgery: [phone]   Remember:   Do not eat or drink :After Midnight.    Take these medicines the morning of surgery with A SIP OF WATER:        Prilosec and claritin    Do not wear jewelry, make-up or nail polish.  Do not wear lotions, powders, or perfumes. You may wear deodorant.  Do not bring valuables to the hospital.  Contacts, dentures or bridgework may not be worn into surgery.  Patients discharged the day of surgery will not be allowed to drive home.  Name and phone number of your driver:    [email or web address] @ Cataract Surgery  A cataract is a clouding of the lens of the eye. When a lens becomes cloudy, vision is reduced based on the degree and nature of the clouding. Surgery may be needed to improve vision. Surgery removes the cloudy lens and usually replaces it with a substitute lens (intraocular lens, IOL). LET YOUR EYE DOCTOR KNOW ABOUT:  Allergies to food or medicine.   Medicines taken including herbs, eyedrops, over-the-counter medicines, and creams.   Use of steroids (by mouth or creams).   Previous problems with anesthetics or numbing medicine.   History of bleeding problems or blood clots.   Previous surgery.   Other health problems, including diabetes and kidney problems.   Possibility of pregnancy, if this applies.  RISKS AND COMPLICATIONS  Infection.   Inflammation of the eyeball (endophthalmitis) that can spread to both eyes (sympathetic ophthalmia).   Poor wound healing.   If an IOL is inserted, it can later fall out of proper position. This is very uncommon.   Clouding of the part of your eye that holds an IOL in place. This is called an "after-cataract." These are uncommon, but easily treated.  BEFORE THE PROCEDURE  Do not eat or drink anything except small amounts of water for 8 to 12 before  your surgery, or as directed by your caregiver.   Unless you are told otherwise, continue any eyedrops you have been prescribed.   Talk to your primary caregiver about all other medicines that you take (both prescription and non-prescription). In some cases, you may need to stop or change medicines near the time of your surgery. This is most important if you are taking blood-thinning medicine.Do not stop medicines unless you are told to do so.   Arrange for someone to drive you to and from the procedure.   Do not put contact lenses in either eye on the day of your surgery.  PROCEDURE There is more than one method for safely removing a cataract. Your doctor can explain the differences and help determine which is best for you. Phacoemulsification surgery is the most common form of cataract surgery.  An injection is given behind the eye or eyedrops are given to make this a painless procedure.   A small cut (incision) is made on the edge of the clear, dome-shaped surface that covers the front of the eye (cornea).   A tiny probe is painlessly inserted into the eye. This device gives off ultrasound waves that soften and break up the cloudy center of the lens. This makes it easier for the cloudy lens to be removed by suction.   An IOL may  be implanted.   The normal lens of the eye is covered by a clear capsule. Part of that capsule is intentionally left in the eye to support the IOL.   Your surgeon may or may not use stitches to close the incision.  There are other forms of cataract surgery that require a larger incision and stiches to close the eye. This approach is taken in cases where the doctor feels that the cataract cannot be easily removed using phacoemulsification. AFTER THE PROCEDURE  When an IOL is implanted, it does not need care. It becomes a permanent part of your eye and cannot be seen or felt.   Your doctor will schedule follow-up exams to check on your progress.   Review  your other medicines with your doctor to see which can be resumed after surgery.   Use eyedrops or take medicine as prescribed by your doctor.  Document Released: 03/27/2011 Document Reviewed: 03/24/2011 [facility] Patient Information 2012 [place].  .Cataract Surgery Care After Refer to this sheet in the next few weeks. These instructions provide you with information on caring for yourself after your procedure. Your caregiver may also give you more specific instructions. Your treatment has been planned according to current medical practices, but problems sometimes occur. Call your caregiver if you have any problems or questions after your procedure.  HOME CARE INSTRUCTIONS   Avoid strenuous activities as directed by your caregiver.   Ask your caregiver when you can resume driving.   Use eyedrops or other medicines to help healing and control pressure inside your eye as directed by your caregiver.   Only take over-the-counter or prescription medicines for pain, discomfort, or fever as directed by your caregiver.   Do not to touch or rub your eyes.   You may be instructed to use a protective shield during the first few days and nights after surgery. If not, wear sunglasses to protect your eyes. This is to protect the eye from pressure or from being accidentally bumped.   Keep the area around your eye clean and dry. Avoid swimming or allowing water to hit you directly in the face while showering. Keep soap and shampoo out of your eyes.   Do not bend or lift heavy objects. Bending increases pressure in the eye. You can walk, climb stairs, and do light household chores.   Do not put a contact lens into the eye that had surgery until your caregiver says it is okay to do so.   Ask your doctor when you can return to work. This will depend on the kind of work that you do. If you work in a dusty environment, you may be advised to wear protective eyewear for a period of time.   Ask your  caregiver when it will be safe to engage in sexual activity.   Continue with your regular eye exams as directed by your caregiver.  What to expect:  It is normal to feel itching and mild discomfort for a few days after cataract surgery. Some fluid discharge is also common, and your eye may be sensitive to light and touch.   After 1 to 2 days, even moderate discomfort should disappear. In most cases, healing will take about 6 weeks.   If you received an intraocular lens (IOL), you may notice that colors are very bright or have a blue tinge. Also, if you have been in bright sunlight, everything may appear reddish for a few hours. If you see these color tinges, it is  because your lens is clear and no longer cloudy. Within a few months after receiving an IOL, these extra colors should go away. When you have healed, you will probably need new glasses.  SEEK MEDICAL CARE IF:   You have increased bruising around your eye.   You have discomfort not helped by medicine.  SEEK IMMEDIATE MEDICAL CARE IF:   You have a fever.   You have a worsening or sudden vision loss.   You have redness, swelling, or increasing pain in the eye.   You have a thick discharge from the eye that had surgery.  MAKE SURE YOU:  Understand these instructions.   Will watch your condition.   Will get help right away if you are not doing well or get worse.  Document Released: 10/25/2004 Document Revised: 03/27/2011 Document Reviewed: 11/29/2010 [facility] Patient Information 2012 [place].    Monitored Anesthesia Care  Monitored anesthesia care is an anesthesia service for a medical procedure. Anesthesia is the loss of the ability to feel pain. It is produced by medications called anesthetics. It may affect a small area of your body (local anesthesia), a large area of your body (regional anesthesia), or your entire body (general anesthesia). The need for monitored anesthesia care depends your procedure, your  condition, and the potential need for regional or general anesthesia. It is often provided during procedures where:   General anesthesia may be needed if there are complications. This is because you need special care when you are under general anesthesia.   You will be under local or regional anesthesia. This is so that you are able to have higher levels of anesthesia if needed.   You will receive calming medications (sedatives). This is especially the case if sedatives are given to put you in a semi-conscious state of relaxation (deep sedation). This is because the amount of sedative needed to produce this state can be hard to predict. Too much of a sedative can produce general anesthesia. Monitored anesthesia care is performed by one or more caregivers who have special training in all types of anesthesia. You will need to meet with these caregivers before your procedure. During this meeting, they will ask you about your medical history. They will also give you instructions to follow. (For example, you will need to stop eating and drinking before your procedure. You may also need to stop or change medications you are taking.) During your procedure, your caregivers will stay with you. They will:   Watch your condition. This includes watching you blood pressure, breathing, and level of pain.   Diagnose and treat problems that occur.   Give medications if they are needed. These may include calming medications (sedatives) and anesthetics.   Make sure you are comfortable.  Having monitored anesthesia care does not necessarily mean that you will be under anesthesia. It does mean that your caregivers will be able to manage anesthesia if you need it or if it occurs. It also means that you will be able to have a different type of anesthesia than you are having if you need it. When your procedure is complete, your caregivers will continue to watch your condition. They will make sure any medications wear  off before you are allowed to go home.  Document Released: 01/01/2005 Document Revised: 08/02/2012 Document Reviewed: 05/19/2012 [facility] Patient Information 2014 [place], [place].

## 2015-12-28 ENCOUNTER — Encounter (HOSPITAL_COMMUNITY): Payer: Self-pay

## 2015-12-28 ENCOUNTER — Other Ambulatory Visit: Payer: Self-pay

## 2015-12-28 ENCOUNTER — Encounter (HOSPITAL_COMMUNITY)
Admission: RE | Admit: 2015-12-28 | Discharge: 2015-12-28 | Disposition: A | Discharge status: Home / Self Care | Payer: Medicare Other | Source: Ambulatory Visit | Attending: Ophthalmology | Admitting: Ophthalmology

## 2015-12-28 DIAGNOSIS — Z01812 Encounter for preprocedural laboratory examination: Secondary | ICD-10-CM | POA: Insufficient documentation

## 2015-12-28 DIAGNOSIS — Z01810 Encounter for preprocedural cardiovascular examination: Secondary | ICD-10-CM | POA: Insufficient documentation

## 2015-12-28 DIAGNOSIS — R269 Unspecified abnormalities of gait and mobility: Secondary | ICD-10-CM | POA: Diagnosis not present

## 2015-12-28 DIAGNOSIS — H2511 Age-related nuclear cataract, right eye: Secondary | ICD-10-CM | POA: Insufficient documentation

## 2015-12-28 DIAGNOSIS — Z87440 Personal history of urinary (tract) infections: Secondary | ICD-10-CM | POA: Diagnosis not present

## 2015-12-28 DIAGNOSIS — M19071 Primary osteoarthritis, right ankle and foot: Secondary | ICD-10-CM | POA: Insufficient documentation

## 2015-12-28 DIAGNOSIS — I10 Essential (primary) hypertension: Secondary | ICD-10-CM | POA: Diagnosis not present

## 2015-12-28 DIAGNOSIS — R002 Palpitations: Secondary | ICD-10-CM | POA: Insufficient documentation

## 2015-12-28 DIAGNOSIS — R9431 Abnormal electrocardiogram [ECG] [EKG]: Secondary | ICD-10-CM | POA: Diagnosis not present

## 2015-12-28 HISTORY — DX: Essential (primary) hypertension: I10

## 2015-12-28 LAB — BASIC METABOLIC PANEL
Anion gap: 4 — ABNORMAL LOW (ref 5–15)
BUN: 14 mg/dL (ref 6–20)
CHLORIDE: 107 mmol/L (ref 101–111)
CO2: 28 mmol/L (ref 22–32)
Calcium: 8.8 mg/dL — ABNORMAL LOW (ref 8.9–10.3)
Creatinine, Ser: 0.71 mg/dL (ref 0.44–1.00)
GFR calc Af Amer: >60 mL/min (ref >60)
Glucose, Bld: 92 mg/dL (ref 65–99)
Potassium: 4.2 mmol/L (ref 3.5–5.1)
SODIUM: 139 mmol/L (ref 135–145)

## 2015-12-28 LAB — CBC
HCT: 40.2 % (ref 36.0–46.0)
HEMOGLOBIN: 13.3 g/dL (ref 12.0–15.0)
MCH: 29.5 pg (ref 26.0–34.0)
MCHC: 33.1 g/dL (ref 30.0–36.0)
MCV: 89.1 fL (ref 78.0–100.0)
Platelets: 297 10*3/uL (ref 150–400)
RBC: 4.51 MIL/uL (ref 3.87–5.11)
RDW: 12.7 % (ref 11.5–15.5)
WBC: 7.4 10*3/uL (ref 4.0–10.5)

## 2016-01-01 ENCOUNTER — Ambulatory Visit (HOSPITAL_COMMUNITY): Payer: Medicare Other | Admitting: Anesthesiology

## 2016-01-01 ENCOUNTER — Encounter (HOSPITAL_COMMUNITY)
Admission: RE | Disposition: A | Discharge status: Home / Self Care | Payer: Self-pay | Source: Ambulatory Visit | Attending: Ophthalmology

## 2016-01-01 ENCOUNTER — Ambulatory Visit (HOSPITAL_COMMUNITY)
Admission: RE | Admit: 2016-01-01 | Discharge: 2016-01-01 | Disposition: A | Discharge status: Home / Self Care | Payer: Medicare Other | Source: Ambulatory Visit | Attending: Ophthalmology | Admitting: Ophthalmology

## 2016-01-01 ENCOUNTER — Encounter (HOSPITAL_COMMUNITY): Payer: Self-pay | Admitting: *Deleted

## 2016-01-01 DIAGNOSIS — M1990 Unspecified osteoarthritis, unspecified site: Secondary | ICD-10-CM | POA: Diagnosis not present

## 2016-01-01 DIAGNOSIS — H2511 Age-related nuclear cataract, right eye: Secondary | ICD-10-CM | POA: Diagnosis not present

## 2016-01-01 DIAGNOSIS — H2512 Age-related nuclear cataract, left eye: Secondary | ICD-10-CM | POA: Diagnosis not present

## 2016-01-01 DIAGNOSIS — H269 Unspecified cataract: Secondary | ICD-10-CM | POA: Diagnosis not present

## 2016-01-01 DIAGNOSIS — Z8673 Personal history of transient ischemic attack (TIA), and cerebral infarction without residual deficits: Secondary | ICD-10-CM | POA: Diagnosis not present

## 2016-01-01 DIAGNOSIS — I10 Essential (primary) hypertension: Secondary | ICD-10-CM | POA: Diagnosis not present

## 2016-01-01 HISTORY — PX: CATARACT EXTRACTION W/PHACO: SHX586

## 2016-01-01 SURGERY — PHACOEMULSIFICATION, CATARACT, WITH IOL INSERTION
Anesthesia: Monitor Anesthesia Care | Site: Eye | Laterality: Right

## 2016-01-01 MED ORDER — FENTANYL CITRATE (PF) 100 MCG/2ML IJ SOLN
25.0000 ug | Freq: Once | INTRAMUSCULAR | Status: AC
  Administered 2016-01-01: 25 ug via INTRAVENOUS
  Filled 2016-01-01: qty 2

## 2016-01-01 MED ORDER — PROVISC 10 MG/ML IO SOLN
INTRAOCULAR | Status: DC | PRN
  Administered 2016-01-01: 0.85 mL via INTRAOCULAR

## 2016-01-01 MED ORDER — TETRACAINE 0.5 % OP SOLN OPTIME - NO CHARGE
OPHTHALMIC | Status: DC | PRN
  Administered 2016-01-01: 2 [drp] via OPHTHALMIC

## 2016-01-01 MED ORDER — KETOROLAC TROMETHAMINE 0.5 % OP SOLN
1.0000 [drp] | OPHTHALMIC | Status: AC
  Administered 2016-01-01 (×3): 1 [drp] via OPHTHALMIC

## 2016-01-01 MED ORDER — BSS IO SOLN
INTRAOCULAR | Status: DC | PRN
  Administered 2016-01-01: 15 mL

## 2016-01-01 MED ORDER — TETRACAINE HCL 0.5 % OP SOLN
1.0000 [drp] | OPHTHALMIC | Status: AC
  Administered 2016-01-01 (×3): 1 [drp] via OPHTHALMIC

## 2016-01-01 MED ORDER — PHENYLEPHRINE HCL 2.5 % OP SOLN
1.0000 [drp] | OPHTHALMIC | Status: AC
  Administered 2016-01-01 (×3): 1 [drp] via OPHTHALMIC

## 2016-01-01 MED ORDER — CYCLOPENTOLATE-PHENYLEPHRINE 0.2-1 % OP SOLN
1.0000 [drp] | OPHTHALMIC | Status: AC
  Administered 2016-01-01 (×3): 1 [drp] via OPHTHALMIC

## 2016-01-01 MED ORDER — EPINEPHRINE HCL 1 MG/ML IJ SOLN
INTRAOCULAR | Status: DC | PRN
  Administered 2016-01-01: 500 mL

## 2016-01-01 MED ORDER — EPINEPHRINE HCL 1 MG/ML IJ SOLN
INTRAMUSCULAR | Status: AC
  Filled 2016-01-01: qty 1

## 2016-01-01 MED ORDER — LACTATED RINGERS IV SOLN
Freq: Once | INTRAVENOUS | Status: AC
  Administered 2016-01-01: 09:00:00 via INTRAVENOUS

## 2016-01-01 MED ORDER — MIDAZOLAM HCL 2 MG/2ML IJ SOLN
2.0000 mg | Freq: Once | INTRAMUSCULAR | Status: AC
  Administered 2016-01-01: 2 mg via INTRAVENOUS
  Filled 2016-01-01: qty 2

## 2016-01-01 SURGICAL SUPPLY — 11 items
CLOTH BEACON ORANGE TIMEOUT ST (SAFETY) ×3 IMPLANT
EYE SHIELD UNIVERSAL CLEAR (GAUZE/BANDAGES/DRESSINGS) ×3 IMPLANT
GLOVE BIOGEL PI IND STRL 6.5 (GLOVE) ×1 IMPLANT
GLOVE BIOGEL PI IND STRL 7.0 (GLOVE) ×1 IMPLANT
GLOVE BIOGEL PI INDICATOR 6.5 (GLOVE) ×2
GLOVE BIOGEL PI INDICATOR 7.0 (GLOVE) ×2
LENS ALC ACRYL/TECN (Ophthalmic Related) ×3 IMPLANT
PAD ARMBOARD 7.5X6 YLW CONV (MISCELLANEOUS) ×3 IMPLANT
TAPE SURG TRANSPORE 1 IN (GAUZE/BANDAGES/DRESSINGS) ×1 IMPLANT
TAPE SURGICAL TRANSPORE 1 IN (GAUZE/BANDAGES/DRESSINGS) ×2
WATER STERILE IRR 250ML POUR (IV SOLUTION) ×3 IMPLANT

## 2016-01-01 NOTE — Anesthesia Procedure Notes (Signed)
Procedure Name: MAC Date/Time: 01/01/2016 8:49 AM Performed by: Vista Deck Pre-anesthesia Checklist: Patient identified, Emergency Drugs available, Suction available, Timeout performed and Patient being monitored Patient Re-evaluated:Patient Re-evaluated prior to inductionOxygen Delivery Method: Nasal Cannula

## 2016-01-01 NOTE — Op Note (Signed)
Patient brought to the operating room and prepped and draped in the usual manner.  Lid speculum inserted in right eye.  Stab incision made at the twelve o'clock position.  Provisc instilled in the anterior chamber.   A 2.4 mm. Stab incision was made temporally.  An anterior capsulotomy was done with a bent 25 gauge needle.  The nucleus was hydrodissected.  The Phaco tip was inserted in the anterior chamber and the nucleus was emulsified.  CDE was 9.69.  The cortical material was then removed with the I and A tip.  Posterior capsule was the polished.  The anterior chamber was deepened with Provisc.  A 14.0 Diopter Alcon [id number] IOL was then inserted in the capsular bag.  Provisc was then removed with the I and A tip.  The wound was then hydrated.  Patient sent to the Recovery Room in good condition with follow up in my office.  Preoperative Diagnosis:  Nuclear Cataract OD Postoperative Diagnosis:  Same Procedure name: [facility] Phacoemulsification OD with IOL

## 2016-01-01 NOTE — Transfer of Care (Signed)
Immediate Anesthesia Transfer of Care Note  Patient: [name]  Procedure(s) Performed: Procedure(s) (LRB): CATARACT EXTRACTION PHACO AND INTRAOCULAR LENS PLACEMENT RIGHT EYE (Right)  Patient Location: Shortstay  Anesthesia Type: MAC  Level of Consciousness: awake  Airway & Oxygen Therapy: Patient Spontanous Breathing   Post-op Assessment: Report given to PACU RN, Post -op Vital signs reviewed and stable and Patient moving all extremities  Post vital signs: Reviewed and stable  Complications: No apparent anesthesia complications

## 2016-01-01 NOTE — H&P (Signed)
The patient was re examined and there is no change in the patients condition since the original H and P. 

## 2016-01-01 NOTE — Discharge Instructions (Signed)
°  °          [facility] Instructions °[address]- [place] [address] °    ° °1. Avoid closing eyes tightly. One often closes the eye tightly when laughing, talking, sneezing, coughing or if they feel irritated. At these times, you should be careful not to close your eyes tightly. ° °2. Instill eye drops as instructed. To instill drops in your eye, open it, look up and have someone gently pull the lower lid down and instill a couple of drops inside the lower lid. ° °3. Do not touch upper lid. ° °4. Take Advil or Tylenol for pain. ° °5. You may use either eye for near work, such as reading or sewing and you may watch television. ° °6. You may have your hair done at the beauty parlor at any time. ° °7. Wear dark glasses with or without your own glasses if you are in bright light. ° °8. Call our office at [phone] or [phone] if you have sharp pain in your eye or unusual symptoms. ° °9.  FOLLOW UP WITH DR. SHAPIRO TODAY IN HIS [place] OFFICE AT 2:45pm. ° °  °I have received a copy of the above instructions and will follow them.  ° ° ° °IF YOU ARE IN IMMEDIATE DANGER CALL 911! ° °It is important for you to keep your follow-up appointment with your physician after discharge, OR, for you /your caregiver to make a follow-up appointment with your physician / medical provider after discharge. ° °Show these instructions to the next healthcare provider you see. °PATIENT INSTRUCTIONS °POST-ANESTHESIA ° °IMMEDIATELY FOLLOWING SURGERY:  Do not drive or operate machinery for the first twenty four hours after surgery.  Do not make any important decisions for twenty four hours after surgery or while taking narcotic pain medications or sedatives.  If you develop intractable nausea and vomiting or a severe headache please notify your doctor immediately. ° °FOLLOW-UP:  Please make an appointment with your surgeon as instructed. You do not need to follow up with anesthesia unless  specifically instructed to do so. ° °WOUND CARE INSTRUCTIONS (if applicable):  Keep a dry clean dressing on the anesthesia/puncture wound site if there is drainage.  Once the wound has quit draining you may leave it open to air.  Generally you should leave the bandage intact for twenty four hours unless there is drainage.  If the epidural site drains for more than 36-48 hours please call the anesthesia department. ° °QUESTIONS?:  Please feel free to call your physician or the hospital operator if you have any questions, and they will be happy to assist you.    ° ° ° °

## 2016-01-01 NOTE — Anesthesia Postprocedure Evaluation (Signed)
Anesthesia Post Note  Patient: [name]  Procedure(s) Performed: Procedure(s) (LRB): CATARACT EXTRACTION PHACO AND INTRAOCULAR LENS PLACEMENT RIGHT EYE (Right)  Anesthesia Post Evaluation  Last Vitals:  Vitals:   01/01/16 0840 01/01/16 0845  BP: 138/72 134/63  Resp: (!) 21 20  Temp:      Last Pain:  Vitals:   01/01/16 0754  TempSrc: Oral                 Latroya Ng     Anesthesia Post-op Note  Patient: [name]  Procedure(s) Performed: Procedure(s) (LRB): CATARACT EXTRACTION PHACO AND INTRAOCULAR LENS PLACEMENT RIGHT EYE (Right)  Patient Location:  Short Stay  Anesthesia Type: MAC  Level of Consciousness: awake  Airway and Oxygen Therapy: Patient Spontanous Breathing  Post-op Pain: none  Post-op Assessment: Post-op Vital signs reviewed, Patient's Cardiovascular Status Stable, Respiratory Function Stable, Patent Airway, No signs of Nausea or vomiting and Pain level controlled  Post-op Vital Signs: Reviewed and stable  Complications: No apparent anesthesia complications

## 2016-01-01 NOTE — Anesthesia Preprocedure Evaluation (Signed)
Anesthesia Evaluation  Patient identified by MRN, date of birth, ID band Patient awake    Reviewed: Allergy & Precautions, H&P , NPO status , Patient's Chart, lab work & pertinent test results  Airway Mallampati: II   Neck ROM: full    Dental   Pulmonary    breath sounds clear to auscultation       Cardiovascular hypertension,  Rhythm:regular Rate:Normal     Neuro/Psych TIA   GI/Hepatic   Endo/Other    Renal/GU      Musculoskeletal   Abdominal   Peds  Hematology   Anesthesia Other Findings   Reproductive/Obstetrics                             Anesthesia Physical Anesthesia Plan  ASA: II  Anesthesia Plan: MAC   Post-op Pain Management:    Induction: Intravenous  Airway Management Planned: Nasal Cannula  Additional Equipment:   Intra-op Plan:   Post-operative Plan:   Informed Consent: I have reviewed the patients History and Physical, chart, labs and discussed the procedure including the risks, benefits and alternatives for the proposed anesthesia with the patient or authorized representative who has indicated his/her understanding and acceptance.     Plan Discussed with: CRNA, Anesthesiologist and Surgeon  Anesthesia Plan Comments:         Anesthesia Quick Evaluation

## 2016-01-04 ENCOUNTER — Encounter (HOSPITAL_COMMUNITY): Payer: Self-pay | Admitting: Ophthalmology

## 2016-01-23 ENCOUNTER — Encounter (HOSPITAL_COMMUNITY): Payer: Self-pay

## 2016-01-23 MED ORDER — FENTANYL CITRATE (PF) 100 MCG/2ML IJ SOLN: 25.0000 ug | INTRAMUSCULAR | Status: DC | PRN

## 2016-01-23 MED ORDER — OXYCODONE HCL 5 MG PO TABS: 5.0000 mg | ORAL_TABLET | Freq: Once | ORAL | Status: DC | PRN

## 2016-01-23 MED ORDER — ONDANSETRON HCL 4 MG/2ML IJ SOLN: 4.0000 mg | Freq: Four times a day (QID) | INTRAMUSCULAR | Status: DC | PRN

## 2016-01-23 MED ORDER — OXYCODONE HCL 5 MG/5ML PO SOLN: 5.0000 mg | Freq: Once | ORAL | Status: DC | PRN

## 2016-01-24 ENCOUNTER — Encounter (HOSPITAL_COMMUNITY)
Admission: RE | Admit: 2016-01-24 | Discharge: 2016-01-24 | Disposition: A | Discharge status: Home / Self Care | Payer: Medicare Other | Source: Ambulatory Visit | Attending: Ophthalmology | Admitting: Ophthalmology

## 2016-01-28 MED ORDER — TETRACAINE HCL 0.5 % OP SOLN
OPHTHALMIC | Status: AC
  Filled 2016-01-28: qty 4

## 2016-01-28 MED ORDER — KETOROLAC TROMETHAMINE 0.5 % OP SOLN
OPHTHALMIC | Status: AC
  Filled 2016-01-28: qty 5

## 2016-01-28 MED ORDER — PHENYLEPHRINE HCL 2.5 % OP SOLN
OPHTHALMIC | Status: AC
  Filled 2016-01-28: qty 15

## 2016-01-28 MED ORDER — CYCLOPENTOLATE-PHENYLEPHRINE 0.2-1 % OP SOLN
OPHTHALMIC | Status: AC
  Filled 2016-01-28: qty 2

## 2016-01-29 ENCOUNTER — Encounter (HOSPITAL_COMMUNITY): Payer: Self-pay | Admitting: *Deleted

## 2016-01-29 ENCOUNTER — Ambulatory Visit (HOSPITAL_COMMUNITY)
Admission: RE | Admit: 2016-01-29 | Discharge: 2016-01-29 | Disposition: A | Discharge status: Home / Self Care | Payer: Medicare Other | Source: Ambulatory Visit | Attending: Ophthalmology | Admitting: Ophthalmology

## 2016-01-29 ENCOUNTER — Encounter (HOSPITAL_COMMUNITY)
Admission: RE | Disposition: A | Discharge status: Home / Self Care | Payer: Self-pay | Source: Ambulatory Visit | Attending: Ophthalmology

## 2016-01-29 ENCOUNTER — Ambulatory Visit (HOSPITAL_COMMUNITY): Payer: Medicare Other | Admitting: Anesthesiology

## 2016-01-29 DIAGNOSIS — H2512 Age-related nuclear cataract, left eye: Secondary | ICD-10-CM | POA: Insufficient documentation

## 2016-01-29 DIAGNOSIS — M1990 Unspecified osteoarthritis, unspecified site: Secondary | ICD-10-CM | POA: Insufficient documentation

## 2016-01-29 DIAGNOSIS — Z79899 Other long term (current) drug therapy: Secondary | ICD-10-CM | POA: Diagnosis not present

## 2016-01-29 DIAGNOSIS — I10 Essential (primary) hypertension: Secondary | ICD-10-CM | POA: Diagnosis not present

## 2016-01-29 HISTORY — PX: CATARACT EXTRACTION W/PHACO: SHX586

## 2016-01-29 SURGERY — PHACOEMULSIFICATION, CATARACT, WITH IOL INSERTION
Anesthesia: Monitor Anesthesia Care | Site: Eye | Laterality: Left

## 2016-01-29 MED ORDER — EPINEPHRINE PF 1 MG/ML IJ SOLN
INTRAMUSCULAR | Status: DC | PRN
  Administered 2016-01-29: 500 mL

## 2016-01-29 MED ORDER — FENTANYL CITRATE (PF) 100 MCG/2ML IJ SOLN
25.0000 ug | INTRAMUSCULAR | Status: DC | PRN
  Administered 2016-01-29: 25 ug via INTRAVENOUS

## 2016-01-29 MED ORDER — TETRACAINE HCL 0.5 % OP SOLN
1.0000 [drp] | OPHTHALMIC | Status: AC
  Administered 2016-01-29 (×3): 1 [drp] via OPHTHALMIC

## 2016-01-29 MED ORDER — MIDAZOLAM HCL 2 MG/2ML IJ SOLN
1.0000 mg | INTRAMUSCULAR | Status: DC | PRN
  Administered 2016-01-29: 2 mg via INTRAVENOUS

## 2016-01-29 MED ORDER — BSS IO SOLN
INTRAOCULAR | Status: DC | PRN
  Administered 2016-01-29: 15 mL

## 2016-01-29 MED ORDER — PHENYLEPHRINE HCL 2.5 % OP SOLN
1.0000 [drp] | OPHTHALMIC | Status: AC
  Administered 2016-01-29 (×3): 1 [drp] via OPHTHALMIC

## 2016-01-29 MED ORDER — KETOROLAC TROMETHAMINE 0.5 % OP SOLN
1.0000 [drp] | OPHTHALMIC | Status: AC
  Administered 2016-01-29 (×3): 1 [drp] via OPHTHALMIC

## 2016-01-29 MED ORDER — LIDOCAINE HCL (PF) 1 % IJ SOLN
INTRAMUSCULAR | Status: AC
  Filled 2016-01-29: qty 2

## 2016-01-29 MED ORDER — MIDAZOLAM HCL 2 MG/2ML IJ SOLN
INTRAMUSCULAR | Status: AC
  Filled 2016-01-29: qty 2

## 2016-01-29 MED ORDER — LACTATED RINGERS IV SOLN
INTRAVENOUS | Status: DC
  Administered 2016-01-29: 07:00:00 via INTRAVENOUS

## 2016-01-29 MED ORDER — FENTANYL CITRATE (PF) 100 MCG/2ML IJ SOLN
INTRAMUSCULAR | Status: AC
  Filled 2016-01-29: qty 2

## 2016-01-29 MED ORDER — CYCLOPENTOLATE-PHENYLEPHRINE 0.2-1 % OP SOLN
1.0000 [drp] | OPHTHALMIC | Status: AC
  Administered 2016-01-29 (×3): 1 [drp] via OPHTHALMIC

## 2016-01-29 MED ORDER — LACTATED RINGERS IV SOLN
INTRAVENOUS | Status: DC | PRN
  Administered 2016-01-29: 07:00:00 via INTRAVENOUS

## 2016-01-29 MED ORDER — TETRACAINE 0.5 % OP SOLN OPTIME - NO CHARGE
OPHTHALMIC | Status: DC | PRN
  Administered 2016-01-29: 2 [drp] via OPHTHALMIC

## 2016-01-29 MED ORDER — EPINEPHRINE PF 1 MG/ML IJ SOLN
INTRAMUSCULAR | Status: AC
  Filled 2016-01-29: qty 1

## 2016-01-29 MED ORDER — PROVISC 10 MG/ML IO SOLN
INTRAOCULAR | Status: DC | PRN
  Administered 2016-01-29: 0.85 mL via INTRAOCULAR

## 2016-01-29 SURGICAL SUPPLY — 11 items
CLOTH BEACON ORANGE TIMEOUT ST (SAFETY) ×3 IMPLANT
EYE SHIELD UNIVERSAL CLEAR (GAUZE/BANDAGES/DRESSINGS) ×3 IMPLANT
GLOVE BIO SURGEON STRL SZ 6.5 (GLOVE) ×2 IMPLANT
GLOVE BIO SURGEONS STRL SZ 6.5 (GLOVE) ×1
GLOVE EXAM NITRILE MD LF STRL (GLOVE) ×3 IMPLANT
LENS ALC ACRYL/TECN (Ophthalmic Related) ×3 IMPLANT
PAD ARMBOARD 7.5X6 YLW CONV (MISCELLANEOUS) ×3 IMPLANT
SIGHTPATH CAT PROC W REG LENS (Ophthalmic Related) ×3 IMPLANT
TAPE SURG TRANSPORE 1 IN (GAUZE/BANDAGES/DRESSINGS) ×1 IMPLANT
TAPE SURGICAL TRANSPORE 1 IN (GAUZE/BANDAGES/DRESSINGS) ×2
WATER STERILE IRR 250ML POUR (IV SOLUTION) ×3 IMPLANT

## 2016-01-29 NOTE — Anesthesia Postprocedure Evaluation (Signed)
Anesthesia Post Note  Patient: [name]  Procedure(s) Performed: Procedure(s) (LRB): CATARACT EXTRACTION PHACO AND INTRAOCULAR LENS PLACEMENT (IOC) (Left)  Patient location during evaluation: Short Stay Anesthesia Type: MAC Level of consciousness: awake, awake and alert and patient cooperative Pain management: pain level controlled Vital Signs Assessment: post-procedure vital signs reviewed and stable Respiratory status: spontaneous breathing and respiratory function stable Cardiovascular status: stable Anesthetic complications: no    Last Vitals:  Vitals:   01/29/16 0644  BP: (!) 164/75  Pulse: 62  Resp: 20  Temp: 36.7 C    Last Pain:  Vitals:   01/29/16 0644  TempSrc: Oral                 Cillian Gwinner

## 2016-01-29 NOTE — Discharge Instructions (Signed)
°  °          [facility] Instructions °[address]- [place] [address]-[place] °    ° °1. Avoid closing eyes tightly. One often closes the eye tightly when laughing, talking, sneezing, coughing or if they feel irritated. At these times, you should be careful not to close your eyes tightly. ° °2. Instill eye drops as instructed. To instill drops in your eye, open it, look up and have someone gently pull the lower lid down and instill a couple of drops inside the lower lid. ° °3. Do not touch upper lid. ° °4. Take Advil or Tylenol for pain. ° °5. You may use either eye for near work, such as reading or sewing and you may watch television. ° °6. You may have your hair done at the beauty parlor at any time. ° °7. Wear dark glasses with or without your own glasses if you are in bright light. ° °8. Call our office at [phone] or [phone] if you have sharp pain in your eye or unusual symptoms. ° °9.  FOLLOW UP WITH DR. SHAPIRO TODAY IN HIS [place] OFFICE AT 2:45pm. ° °  °I have received a copy of the above instructions and will follow them.  ° ° ° °IF YOU ARE IN IMMEDIATE DANGER CALL 911! ° °It is important for you to keep your follow-up appointment with your physician after discharge, OR, for you /your caregiver to make a follow-up appointment with your physician / medical provider after discharge. ° °Show these instructions to the next healthcare provider you see. ° °

## 2016-01-29 NOTE — Transfer of Care (Signed)
Immediate Anesthesia Transfer of Care Note  Patient: [name]  Procedure(s) Performed: Procedure(s) with comments: CATARACT EXTRACTION PHACO AND INTRAOCULAR LENS PLACEMENT (IOC) (Left) - CDE: 9.32  Patient Location: PACU and Short Stay  Anesthesia Type:MAC  Level of Consciousness: awake, alert , oriented, patient cooperative and responds to stimulation  Airway & Oxygen Therapy: Patient Spontanous Breathing  Post-op Assessment: Report given to RN and Post -op Vital signs reviewed and stable  Post vital signs: Reviewed and stable  Last Vitals:  Vitals:   01/29/16 0644  BP: (!) 164/75  Pulse: 62  Resp: 20  Temp: 36.7 C    Last Pain:  Vitals:   01/29/16 0644  TempSrc: Oral      Patients Stated Pain Goal: 5 ([id number])  Complications: No apparent anesthesia complications

## 2016-01-29 NOTE — Anesthesia Procedure Notes (Addendum)
Procedure Name: MAC Date/Time: 01/29/2016 7:35 AM Performed by: Michele Rockers Pre-anesthesia Checklist: Patient identified, Emergency Drugs available, Suction available, Timeout performed and Patient being monitored Patient Re-evaluated:Patient Re-evaluated prior to inductionOxygen Delivery Method: Nasal Cannula

## 2016-01-29 NOTE — Addendum Note (Signed)
Addendum  created 01/29/16 1054 by Michele Rockers, CRNA   Anesthesia Intra Blocks edited, Sign clinical note

## 2016-01-29 NOTE — H&P (Signed)
The patient was re examined and there is no change in the patients condition since the original H and P. 

## 2016-01-29 NOTE — Op Note (Signed)
Patient brought to the operating room and prepped and draped in the usual manner.  Lid speculum inserted in left eye.  Stab incision made at the twelve o'clock position.  Provisc instilled in the anterior chamber.   A 2.4 mm. Stab incision was made temporally.  An anterior capsulotomy was done with a bent 25 gauge needle.  The nucleus was hydrodissected.  The Phaco tip was inserted in the anterior chamber and the nucleus was emulsified.  CDE was 9.32.  The cortical material was then removed with the I and A tip.  Posterior capsule was the polished.  The anterior chamber was deepened with Provisc.  A 13.5 Diopter Alcon [id number] IOL was then inserted in the capsular bag.  Provisc was then removed with the I and A tip.  The wound was then hydrated.  Patient sent to the Recovery Room in good condition with follow up in my office.  Preoperative Diagnosis:  Nuclear Cataract OS Postoperative Diagnosis:  Same Procedure name: Kelman Phacoemulsification OS with IOL

## 2016-01-29 NOTE — Addendum Note (Signed)
Addendum  created 01/29/16 0906 by Michele Rockers, CRNA   Anesthesia Intra Blocks edited, Sign clinical note

## 2016-01-29 NOTE — Anesthesia Preprocedure Evaluation (Signed)
Anesthesia Evaluation  Patient identified by MRN, date of birth, ID band Patient awake    Reviewed: Allergy & Precautions, H&P , NPO status , Patient's Chart, lab work & pertinent test results  Airway Mallampati: II   Neck ROM: full    Dental   Pulmonary    breath sounds clear to auscultation       Cardiovascular hypertension,  Rhythm:regular Rate:Normal     Neuro/Psych TIA   GI/Hepatic   Endo/Other    Renal/GU      Musculoskeletal   Abdominal   Peds  Hematology   Anesthesia Other Findings   Reproductive/Obstetrics                             Anesthesia Physical Anesthesia Plan  ASA: II  Anesthesia Plan: MAC   Post-op Pain Management:    Induction: Intravenous  Airway Management Planned: Nasal Cannula  Additional Equipment:   Intra-op Plan:   Post-operative Plan:   Informed Consent: I have reviewed the patients History and Physical, chart, labs and discussed the procedure including the risks, benefits and alternatives for the proposed anesthesia with the patient or authorized representative who has indicated his/her understanding and acceptance.     Plan Discussed with: CRNA, Anesthesiologist and Surgeon  Anesthesia Plan Comments:         Anesthesia Quick Evaluation

## 2016-01-31 NOTE — Addendum Note (Signed)
Addendum  created 01/31/16 1627 by Vista Deck, CRNA   Charge Capture section accepted

## 2016-02-01 ENCOUNTER — Encounter (HOSPITAL_COMMUNITY): Payer: Self-pay | Admitting: Ophthalmology

## 2016-02-04 ENCOUNTER — Encounter (HOSPITAL_COMMUNITY): Payer: Self-pay | Admitting: Ophthalmology

## 2016-02-05 ENCOUNTER — Encounter (HOSPITAL_COMMUNITY): Payer: Self-pay | Admitting: Ophthalmology

## 2016-03-17 ENCOUNTER — Other Ambulatory Visit: Payer: Self-pay | Admitting: Family

## 2016-03-17 DIAGNOSIS — K219 Gastro-esophageal reflux disease without esophagitis: Secondary | ICD-10-CM

## 2016-04-17 ENCOUNTER — Other Ambulatory Visit: Payer: Self-pay | Admitting: Family Medicine

## 2016-04-17 DIAGNOSIS — K219 Gastro-esophageal reflux disease without esophagitis: Secondary | ICD-10-CM

## 2016-05-29 DIAGNOSIS — M79671 Pain in right foot: Secondary | ICD-10-CM | POA: Diagnosis not present

## 2016-05-29 DIAGNOSIS — M79672 Pain in left foot: Secondary | ICD-10-CM | POA: Diagnosis not present

## 2016-05-29 DIAGNOSIS — M25572 Pain in left ankle and joints of left foot: Secondary | ICD-10-CM | POA: Diagnosis not present

## 2016-05-29 DIAGNOSIS — M25571 Pain in right ankle and joints of right foot: Secondary | ICD-10-CM | POA: Diagnosis not present

## 2016-06-02 ENCOUNTER — Other Ambulatory Visit: Payer: Self-pay | Admitting: Family Medicine

## 2016-06-02 DIAGNOSIS — K219 Gastro-esophageal reflux disease without esophagitis: Secondary | ICD-10-CM

## 2016-07-15 DIAGNOSIS — Z961 Presence of intraocular lens: Secondary | ICD-10-CM | POA: Diagnosis not present

## 2016-07-15 DIAGNOSIS — H353131 Nonexudative age-related macular degeneration, bilateral, early dry stage: Secondary | ICD-10-CM | POA: Diagnosis not present

## 2016-07-23 ENCOUNTER — Ambulatory Visit (INDEPENDENT_AMBULATORY_CARE_PROVIDER_SITE_OTHER): Payer: Medicare Other | Admitting: Family Medicine

## 2016-07-23 ENCOUNTER — Encounter: Payer: Self-pay | Admitting: Family Medicine

## 2016-07-23 VITALS — BP 139/67 | HR 69 | Temp 97.3°F | Ht 61.0 in | Wt 148.0 lb

## 2016-07-23 DIAGNOSIS — M19031 Primary osteoarthritis, right wrist: Secondary | ICD-10-CM | POA: Diagnosis not present

## 2016-07-23 DIAGNOSIS — M19039 Primary osteoarthritis, unspecified wrist: Secondary | ICD-10-CM

## 2016-07-23 DIAGNOSIS — M19032 Primary osteoarthritis, left wrist: Secondary | ICD-10-CM | POA: Diagnosis not present

## 2016-07-23 MED ORDER — METHYLPREDNISOLONE ACETATE 80 MG/ML IJ SUSP
80.0000 mg | Freq: Once | INTRAMUSCULAR | Status: AC
  Administered 2016-07-23: 80 mg via INTRAMUSCULAR

## 2016-07-23 NOTE — Progress Notes (Signed)
BP 139/67   Pulse 69   Temp 97.3 F (36.3 C) (Oral)   Ht 5\' 1"  (1.549 m)   Wt 148 lb (67.1 kg)   BMI 27.96 kg/m    Subjective:    Patient ID: [name], female    DOB: 07-18-1932, 83 y.o.   MRN: [id number]  HPI: [name] is a 83 y.o. female presenting on 07/23/2016 for Arm Pain   HPI Wrist pain bilateral Patient has had bilateral wrist pain that has been bothering her for the past month and has been worsening. She denies any specific trauma or injury but just overuse and repetitive use. She does think it is related to arthritis but just wants something that can help her get over this because it is starting to impede her ability to do cooking and cleaning sometimes. She denies any fevers or chills or redness or warmth. She denies any joint swelling but just has joint aches in both of her wrists. She denies any numbness or weakness in either breast.  Relevant past medical, surgical, family and social history reviewed and updated as indicated. Interim medical history since our last visit reviewed. Allergies and medications reviewed and updated.  Review of Systems  Constitutional: Negative for chills and fever.  Respiratory: Negative for chest tightness and shortness of breath.   Cardiovascular: Negative for chest pain and leg swelling.  Musculoskeletal: Positive for arthralgias. Negative for back pain, gait problem, joint swelling and myalgias.  Skin: Negative for rash.  Neurological: Negative for weakness, light-headedness, numbness and headaches.  Psychiatric/Behavioral: Negative for agitation and behavioral problems.  All other systems reviewed and are negative.   Per HPI unless specifically indicated above        Objective:    BP 139/67   Pulse 69   Temp 97.3 F (36.3 C) (Oral)   Ht 5\' 1"  (1.549 m)   Wt 148 lb (67.1 kg)   BMI 27.96 kg/m   Wt Readings from Last 3 Encounters:  07/23/16 148 lb (67.1 kg)  01/29/16 148 lb (67.1 kg)  12/28/15 148 lb (67.1  kg)    Physical Exam  Constitutional: She is oriented to person, place, and time. She appears well-developed and well-nourished. No distress.  Eyes: Conjunctivae are normal.  Pulmonary/Chest: Breath sounds normal.  Musculoskeletal: Normal range of motion. She exhibits no edema or tenderness.  Bilateral wrist tenderness with all range of motion. No overlying redness or warmth or swelling. No numbness or weakness in either hands or wrists. Consistent with likely underlying arthritis.  Neurological: She is alert and oriented to person, place, and time. Coordination normal.  Skin: Skin is warm and dry. No rash noted. She is not diaphoretic.  Psychiatric: She has a normal mood and affect. Her behavior is normal.  Nursing note and vitals reviewed.       Assessment & Plan:   Problem List Items Addressed This Visit    None    Visit Diagnoses    Wrist arthritis    -  Primary   Bilateral wrist arthritis, worsening, has been trying Aleve, will give Depo-Medrol   Relevant Medications   methylPREDNISolone acetate (DEPO-MEDROL) injection 80 mg (Start on 07/23/2016  7:00 PM)       Follow up plan: Return if symptoms worsen or fail to improve.  Counseling provided for all of the vaccine components No orders of the defined types were placed in this encounter.   Caryl Pina, MD [facility] 07/23/2016, 6:56 PM

## 2016-09-11 ENCOUNTER — Other Ambulatory Visit: Payer: Self-pay | Admitting: Family Medicine

## 2016-09-11 DIAGNOSIS — K219 Gastro-esophageal reflux disease without esophagitis: Secondary | ICD-10-CM

## 2016-10-03 ENCOUNTER — Encounter: Payer: Self-pay | Admitting: Family Medicine

## 2016-10-03 ENCOUNTER — Other Ambulatory Visit: Payer: Self-pay | Admitting: *Deleted

## 2016-10-03 ENCOUNTER — Ambulatory Visit (INDEPENDENT_AMBULATORY_CARE_PROVIDER_SITE_OTHER): Payer: Medicare Other | Admitting: Family Medicine

## 2016-10-03 ENCOUNTER — Telehealth: Payer: Self-pay | Admitting: Family Medicine

## 2016-10-03 VITALS — BP 156/75 | HR 64 | Temp 98.0°F | Ht 61.0 in | Wt 146.0 lb

## 2016-10-03 DIAGNOSIS — M25532 Pain in left wrist: Secondary | ICD-10-CM

## 2016-10-03 DIAGNOSIS — M19032 Primary osteoarthritis, left wrist: Secondary | ICD-10-CM

## 2016-10-03 DIAGNOSIS — I10 Essential (primary) hypertension: Secondary | ICD-10-CM

## 2016-10-03 DIAGNOSIS — M19039 Primary osteoarthritis, unspecified wrist: Secondary | ICD-10-CM

## 2016-10-03 MED ORDER — METHYLPREDNISOLONE ACETATE 80 MG/ML IJ SUSP
80.0000 mg | Freq: Once | INTRAMUSCULAR | Status: AC
  Administered 2016-10-03: 80 mg via INTRAMUSCULAR

## 2016-10-03 MED ORDER — CYCLOBENZAPRINE HCL 10 MG PO TABS: 10.0000 mg | ORAL_TABLET | Freq: Every evening | ORAL | 0 refills | Status: DC | PRN

## 2016-10-03 MED ORDER — OMEPRAZOLE 20 MG PO CPDR: 20.0000 mg | DELAYED_RELEASE_CAPSULE | ORAL | 3 refills | Status: DC | PRN

## 2016-10-03 MED ORDER — LORATADINE 10 MG PO TABS: 10.0000 mg | ORAL_TABLET | Freq: Every day | ORAL | 9 refills | Status: DC | PRN

## 2016-10-03 NOTE — Progress Notes (Signed)
rx sent in to [facility] per pt request Okayed per Dr Warrick Parisian

## 2016-10-03 NOTE — Assessment & Plan Note (Signed)
BP today, likely because of pain, will continue to monitor and future

## 2016-10-03 NOTE — Progress Notes (Signed)
BP (!) 161/72   Pulse 64   Temp 98 F (36.7 C) (Oral)   Ht 5\' 1"  (1.549 m)   Wt 146 lb (66.2 kg)   BMI 27.59 kg/m    Subjective:    Patient ID: [name], female    DOB: 09/27/1932, 83 y.o.   MRN: [id number]  HPI: [name] is a 83 y.o. female presenting on 10/03/2016 for Pain and swelling in left wrist/arm (no known injury, feels hot when pain is more severe) and Hypertension (Patient discontinued HCTZ because of dizziness)   HPI Wrist swelling and pain Patient has been having wrist swelling and pain that has continued from previous. She was given steroids previously and it did not seem to help. She especially has it in her left wrist but has a little bit in her right wrist as well. She says it feels hot and swollen when it does get really bad and has come down over the past couple days but swells up when she uses it a lot. She denies any fevers or chills or redness.  Hypertension Patient is currently on no medication and has been controlled previously, and their blood pressure today is 161/72. Patient denies any lightheadedness or dizziness. Patient denies headaches, blurred vision, chest pains, shortness of breath, or weakness. Denies any side effects from medication and is content with current medication.   Relevant past medical, surgical, family and social history reviewed and updated as indicated. Interim medical history since our last visit reviewed. Allergies and medications reviewed and updated.  Review of Systems  Constitutional: Negative for chills and fever.  Respiratory: Negative for chest tightness and shortness of breath.   Cardiovascular: Negative for chest pain and leg swelling.  Musculoskeletal: Positive for arthralgias and joint swelling. Negative for back pain and gait problem.  Skin: Negative for color change and rash.  Neurological: Negative for light-headedness and headaches.  Psychiatric/Behavioral: Negative for agitation and behavioral problems.    All other systems reviewed and are negative.   Per HPI unless specifically indicated above        Objective:    BP (!) 161/72   Pulse 64   Temp 98 F (36.7 C) (Oral)   Ht 5\' 1"  (1.549 m)   Wt 146 lb (66.2 kg)   BMI 27.59 kg/m   Wt Readings from Last 3 Encounters:  10/03/16 146 lb (66.2 kg)  07/23/16 148 lb (67.1 kg)  01/29/16 148 lb (67.1 kg)    Physical Exam  Constitutional: She is oriented to person, place, and time. She appears well-developed and well-nourished. No distress.  Eyes: Conjunctivae are normal.  Cardiovascular: Normal rate, regular rhythm, normal heart sounds and intact distal pulses.   No murmur heard. Pulmonary/Chest: Effort normal and breath sounds normal. No respiratory distress. She has no wheezes.  Musculoskeletal: Normal range of motion.       Left wrist: She exhibits tenderness, bony tenderness and swelling. She exhibits normal range of motion, no effusion and no laceration.  Neurological: She is alert and oriented to person, place, and time. Coordination normal.  Skin: Skin is warm and dry. No rash noted. She is not diaphoretic.  Psychiatric: She has a normal mood and affect. Her behavior is normal.  Nursing note and vitals reviewed.       Assessment & Plan:   Problem List Items Addressed This Visit      Cardiovascular and Mediastinum   HTN (hypertension)    BP today, likely because of pain, will continue  to monitor and future       Other Visit Diagnoses    Wrist arthritis    -  Primary   Relevant Medications   methylPREDNISolone acetate (DEPO-MEDROL) injection 80 mg (Start on 10/03/2016 11:45 AM)   cyclobenzaprine (FLEXERIL) 10 MG tablet   Other Relevant Orders   Ambulatory referral to Orthopedic Surgery       Follow up plan: Return if symptoms worsen or fail to improve.  Counseling provided for all of the vaccine components Orders Placed This Encounter  Procedures  . Ambulatory referral to [place]  Bama Hanselman, MD [place] 10/03/2016, 11:33 AM

## 2016-11-28 ENCOUNTER — Other Ambulatory Visit: Payer: Self-pay | Admitting: Family Medicine

## 2016-11-28 MED ORDER — OMEPRAZOLE 20 MG PO CPDR: 20.0000 mg | DELAYED_RELEASE_CAPSULE | ORAL | 3 refills | Status: DC | PRN

## 2016-12-04 ENCOUNTER — Ambulatory Visit (INDEPENDENT_AMBULATORY_CARE_PROVIDER_SITE_OTHER): Payer: Medicare Other

## 2016-12-04 ENCOUNTER — Ambulatory Visit (INDEPENDENT_AMBULATORY_CARE_PROVIDER_SITE_OTHER): Payer: Medicare Other | Admitting: Orthopaedic Surgery

## 2016-12-04 ENCOUNTER — Ambulatory Visit (INDEPENDENT_AMBULATORY_CARE_PROVIDER_SITE_OTHER): Payer: Self-pay

## 2016-12-04 ENCOUNTER — Encounter (INDEPENDENT_AMBULATORY_CARE_PROVIDER_SITE_OTHER): Payer: Self-pay | Admitting: Orthopaedic Surgery

## 2016-12-04 VITALS — BP 160/91 | HR 70 | Ht 61.0 in | Wt 146.0 lb

## 2016-12-04 DIAGNOSIS — M79641 Pain in right hand: Secondary | ICD-10-CM | POA: Diagnosis not present

## 2016-12-04 DIAGNOSIS — G5602 Carpal tunnel syndrome, left upper limb: Secondary | ICD-10-CM

## 2016-12-04 DIAGNOSIS — M79642 Pain in left hand: Secondary | ICD-10-CM

## 2016-12-04 DIAGNOSIS — M542 Cervicalgia: Secondary | ICD-10-CM | POA: Diagnosis not present

## 2016-12-04 DIAGNOSIS — G5601 Carpal tunnel syndrome, right upper limb: Secondary | ICD-10-CM | POA: Diagnosis not present

## 2016-12-04 NOTE — Progress Notes (Signed)
Office Visit Note   Patient: [name]           Date of Birth: 1932/10/10           MRN: [id number] Visit Date: 12/04/2016              Requested by: Dettinger, Fransisca Kaufmann, MD [place], [place] [address] PCP: Dettinger, Fransisca Kaufmann, MD   Assessment & Plan: Visit Diagnoses:  1. Bilateral hand pain   2. Neck pain   3. Carpal tunnel syndrome, left upper limb   4. Carpal tunnel syndrome, right upper limb     Plan: Bilateral wrist splints applied we'll recheck her in 2 weeks  Follow-Up Instructions: Return in about 2 weeks (around 12/18/2016).   Orders:  Orders Placed This Encounter  Procedures  . XR Hand Complete Left  . XR Hand Complete Right  . XR Cervical Spine 2 or 3 views   No orders of the defined types were placed in this encounter.     Procedures: No procedures performed   Clinical Data: No additional findings.   Subjective: Chief Complaint  Patient presents with  . Left Wrist - Pain  . Right Wrist - Pain    HPI 83 year old female seen with bilateral hand pain she's here with an interpreter she's had problems with the progressive hand pain for at least 3 months she states she has trouble lifting objects. Pain bothers her at night it comes and go she has to leave lifting a coffee cup. She's been using a walker since  1998 due to an ankle injury and this is on the right side. She denies any significant neck pain. She is here with her son. Patient is married. She denies any history of diabetes or thyroid condition. Bmet and CBC done last week are available and are normal.  Review of Systems  Constitutional: Negative for chills and diaphoresis.  HENT: Negative for ear discharge, ear pain and nosebleeds.   Eyes: Negative for discharge and visual disturbance.  Respiratory: Negative for cough, choking and shortness of breath.   Cardiovascular: Negative for chest pain and palpitations.       Positive for hypertension  Gastrointestinal: Negative for  abdominal distention and abdominal pain.  Endocrine: Negative for cold intolerance and heat intolerance.  Genitourinary: Negative for flank pain and hematuria.  Musculoskeletal:       Positive for bilateral hand pain  Skin: Negative for rash and wound.  Neurological: Negative for seizures and speech difficulty.  Hematological: Negative for adenopathy. Does not bruise/bleed easily.  Psychiatric/Behavioral: Negative for agitation and suicidal ideas.     Objective: Vital Signs: BP (!) 160/91   Pulse 70   Ht 5\' 1"  (1.549 m)   Wt 146 lb (66.2 kg)   BMI 27.59 kg/m   Physical Exam  Constitutional: She is oriented to person, place, and time. She appears well-developed.  HENT:  Head: Normocephalic.  Right Ear: External ear normal.  Left Ear: External ear normal.  Eyes: Pupils are equal, round, and reactive to light.  Neck: No tracheal deviation present. No thyromegaly present.  Cardiovascular: Normal rate.   Pulmonary/Chest: Effort normal.  Abdominal: Soft.  Musculoskeletal:  Patient has bilateral thenar atrophy noted of the hyperthenar atrophy. Positive Phalen's significant pain with carpal compression right greater than left. No brachial plexus tenderness good cervical range of motion.  Neurological: She is alert and oriented to person, place, and time.  Skin: Skin is warm and dry.  Psychiatric: She  has a normal mood and affect. Her behavior is normal.    Ortho Exam  Specialty Comments:  No specialty comments available.  Imaging: No results found.   PMFS History: Patient Active Problem List   Diagnosis Date Noted  . Tongue swelling 06/27/2015  . Laceration of tongue   . Angioedema 02/21/2015  . Submandibular swelling 02/21/2015  . Thyroid nodule 02/21/2015  . Elevated blood pressure 02/21/2015  . Dysarthria 02/19/2015  . HTN (hypertension) 02/19/2015  . TIA (transient ischemic attack) 02/19/2015  . Impaired weight bearing    Past Medical History:  Diagnosis Date   . Arthritis of right foot   . Hx: UTI (urinary tract infection)   . Hypertension   . Palpitations   . Unsteady gait     Family History  Problem Relation Age of Onset  . Heart attack Mother 41       Died in [place]  . Allergic rhinitis Neg Hx   . Angioedema Neg Hx   . Asthma Neg Hx   . Eczema Neg Hx   . Immunodeficiency Neg Hx   . Urticaria Neg Hx     Past Surgical History:  Procedure Laterality Date  . CATARACT EXTRACTION W/PHACO Right 01/01/2016   Procedure: CATARACT EXTRACTION PHACO AND INTRAOCULAR LENS PLACEMENT RIGHT EYE;  Surgeon: Rutherford Guys, MD;  Location: [facility];  Service: Ophthalmology;  Laterality: Right;  CDE: 9.69  . CATARACT EXTRACTION W/PHACO Left 01/29/2016   Procedure: CATARACT EXTRACTION PHACO AND INTRAOCULAR LENS PLACEMENT (IOC);  Surgeon: Rutherford Guys, MD;  Location: [facility];  Service: Ophthalmology;  Laterality: Left;  CDE: 9.32  . None     Social History   Occupational History  . Homemaker    Social History Main Topics  . Smoking status: Never Smoker  . Smokeless tobacco: Never Used  . Alcohol use No  . Drug use: No  . Sexual activity: Not on file

## 2016-12-16 DIAGNOSIS — M19072 Primary osteoarthritis, left ankle and foot: Secondary | ICD-10-CM | POA: Diagnosis not present

## 2016-12-16 DIAGNOSIS — M25571 Pain in right ankle and joints of right foot: Secondary | ICD-10-CM | POA: Diagnosis not present

## 2016-12-16 DIAGNOSIS — M19071 Primary osteoarthritis, right ankle and foot: Secondary | ICD-10-CM | POA: Diagnosis not present

## 2016-12-16 DIAGNOSIS — M25572 Pain in left ankle and joints of left foot: Secondary | ICD-10-CM | POA: Diagnosis not present

## 2016-12-18 ENCOUNTER — Ambulatory Visit (INDEPENDENT_AMBULATORY_CARE_PROVIDER_SITE_OTHER): Payer: Medicare Other | Admitting: Orthopaedic Surgery

## 2016-12-18 ENCOUNTER — Encounter (INDEPENDENT_AMBULATORY_CARE_PROVIDER_SITE_OTHER): Payer: Self-pay | Admitting: Orthopaedic Surgery

## 2016-12-18 VITALS — BP 168/84 | HR 60

## 2016-12-18 DIAGNOSIS — R200 Anesthesia of skin: Secondary | ICD-10-CM

## 2016-12-18 NOTE — Progress Notes (Signed)
Office Visit Note   Patient: [name]           Date of Birth: 1932-10-23           MRN: [id number] Visit Date: 12/18/2016              Requested by: Worthy Rancher, MD [place], [place] [address] PCP: Dettinger, Fransisca Kaufmann, MD   Assessment & Plan: Visit Diagnoses:  1. Bilateral hand numbness     Plan: Nerve conduction velocities to evaluate bilateral carpal tunnel syndrome worse in the right than left also follow-up after tests for review.  Follow-Up Instructions: No Follow-up on file.   Orders:  No orders of the defined types were placed in this encounter.  No orders of the defined types were placed in this encounter.     Procedures: No procedures performed   Clinical Data: No additional findings.   Subjective: Chief Complaint  Patient presents with  . Neck - Pain, Follow-up  . Left Hand - Pain, Follow-up  . Right Hand - Pain, Follow-up    HPI a 83-year-old female Hispanic here with an interpreter wearing bilateral splints. She has numbness and so wake up at night she has to shake them. She has problems with activities during the day drops objects and notices weakness of her hands in usage.. Numbness is in the radial 3 fingers.  Review of Systems is systems updated unchanged since 12/04/2016 other than as mentioned in history of present illness.   Objective: Vital Signs: BP (!) 168/84   Pulse 60   Physical Exam  Constitutional: She is oriented to person, place, and time. She appears well-developed.  HENT:  Head: Normocephalic.  Right Ear: External ear normal.  Left Ear: External ear normal.  Eyes: Pupils are equal, round, and reactive to light.  Neck: No tracheal deviation present. No thyromegaly present.  Cardiovascular: Normal rate.   Pulmonary/Chest: Effort normal.  Abdominal: Soft.  Neurological: She is alert and oriented to person, place, and time.  Skin: Skin is warm and dry.  Psychiatric: She has a normal mood and affect. Her  behavior is normal.    Ortho Exam bilateral thenar atrophy noted. Positive Phalen's positive Tinel's bilaterally worse on the right than left. She has minimal discomfort cervical range of motion. Reflexes are 2+ and symmetrical. Normal biceps triceps. Median nerve in the forearm is nontender proximally.  Specialty Comments:  No specialty comments available.  Imaging: No results found.   PMFS History: Patient Active Problem List   Diagnosis Date Noted  . Tongue swelling 06/27/2015  . Laceration of tongue   . Angioedema 02/21/2015  . Submandibular swelling 02/21/2015  . Thyroid nodule 02/21/2015  . Elevated blood pressure 02/21/2015  . Dysarthria 02/19/2015  . HTN (hypertension) 02/19/2015  . TIA (transient ischemic attack) 02/19/2015  . Impaired weight bearing    Past Medical History:  Diagnosis Date  . Arthritis of right foot   . Hx: UTI (urinary tract infection)   . Hypertension   . Palpitations   . Unsteady gait     Family History  Problem Relation Age of Onset  . Heart attack Mother 2       Died in [place]  . Allergic rhinitis Neg Hx   . Angioedema Neg Hx   . Asthma Neg Hx   . Eczema Neg Hx   . Immunodeficiency Neg Hx   . Urticaria Neg Hx     Past Surgical History:  Procedure Laterality Date  .  CATARACT EXTRACTION W/PHACO Right 01/01/2016   Procedure: CATARACT EXTRACTION PHACO AND INTRAOCULAR LENS PLACEMENT RIGHT EYE;  Surgeon: Rutherford Guys, MD;  Location: [facility];  Service: Ophthalmology;  Laterality: Right;  CDE: 9.69  . CATARACT EXTRACTION W/PHACO Left 01/29/2016   Procedure: CATARACT EXTRACTION PHACO AND INTRAOCULAR LENS PLACEMENT (IOC);  Surgeon: Rutherford Guys, MD;  Location: [facility];  Service: Ophthalmology;  Laterality: Left;  CDE: 9.32  . None     Social History   Occupational History  . Homemaker    Social History Main Topics  . Smoking status: Never Smoker  . Smokeless tobacco: Never Used  . Alcohol use No  . Drug use: No  . Sexual activity: Not  on file

## 2016-12-25 ENCOUNTER — Ambulatory Visit (INDEPENDENT_AMBULATORY_CARE_PROVIDER_SITE_OTHER): Payer: Medicare Other | Admitting: Orthopaedic Surgery

## 2016-12-26 ENCOUNTER — Other Ambulatory Visit: Payer: Self-pay | Admitting: Family Medicine

## 2016-12-26 DIAGNOSIS — I10 Essential (primary) hypertension: Secondary | ICD-10-CM

## 2017-01-16 ENCOUNTER — Other Ambulatory Visit: Payer: Self-pay | Admitting: Family Medicine

## 2017-01-16 DIAGNOSIS — K219 Gastro-esophageal reflux disease without esophagitis: Secondary | ICD-10-CM | POA: Diagnosis not present

## 2017-01-16 DIAGNOSIS — M25571 Pain in right ankle and joints of right foot: Secondary | ICD-10-CM | POA: Diagnosis not present

## 2017-01-16 DIAGNOSIS — S299XXA Unspecified injury of thorax, initial encounter: Secondary | ICD-10-CM | POA: Diagnosis not present

## 2017-01-16 DIAGNOSIS — M179 Osteoarthritis of knee, unspecified: Secondary | ICD-10-CM | POA: Diagnosis not present

## 2017-01-16 DIAGNOSIS — S20211A Contusion of right front wall of thorax, initial encounter: Secondary | ICD-10-CM | POA: Diagnosis not present

## 2017-01-16 DIAGNOSIS — R079 Chest pain, unspecified: Secondary | ICD-10-CM | POA: Diagnosis not present

## 2017-01-16 DIAGNOSIS — M19039 Primary osteoarthritis, unspecified wrist: Secondary | ICD-10-CM

## 2017-01-16 DIAGNOSIS — M1990 Unspecified osteoarthritis, unspecified site: Secondary | ICD-10-CM | POA: Diagnosis not present

## 2017-01-16 DIAGNOSIS — S92334A Nondisplaced fracture of third metatarsal bone, right foot, initial encounter for closed fracture: Secondary | ICD-10-CM | POA: Diagnosis not present

## 2017-01-16 DIAGNOSIS — S20212A Contusion of left front wall of thorax, initial encounter: Secondary | ICD-10-CM | POA: Diagnosis not present

## 2017-01-16 DIAGNOSIS — I10 Essential (primary) hypertension: Secondary | ICD-10-CM | POA: Diagnosis not present

## 2017-01-16 DIAGNOSIS — S51011A Laceration without foreign body of right elbow, initial encounter: Secondary | ICD-10-CM | POA: Diagnosis not present

## 2017-01-16 DIAGNOSIS — S92344A Nondisplaced fracture of fourth metatarsal bone, right foot, initial encounter for closed fracture: Secondary | ICD-10-CM | POA: Diagnosis not present

## 2017-01-16 DIAGNOSIS — S99911A Unspecified injury of right ankle, initial encounter: Secondary | ICD-10-CM | POA: Diagnosis not present

## 2017-01-16 DIAGNOSIS — S99921A Unspecified injury of right foot, initial encounter: Secondary | ICD-10-CM | POA: Diagnosis not present

## 2017-01-16 DIAGNOSIS — T07XXXA Unspecified multiple injuries, initial encounter: Secondary | ICD-10-CM | POA: Diagnosis not present

## 2017-01-16 DIAGNOSIS — M79671 Pain in right foot: Secondary | ICD-10-CM | POA: Diagnosis not present

## 2017-01-16 DIAGNOSIS — R109 Unspecified abdominal pain: Secondary | ICD-10-CM | POA: Diagnosis not present

## 2017-01-16 DIAGNOSIS — Z79899 Other long term (current) drug therapy: Secondary | ICD-10-CM | POA: Diagnosis not present

## 2017-01-16 DIAGNOSIS — S92324A Nondisplaced fracture of second metatarsal bone, right foot, initial encounter for closed fracture: Secondary | ICD-10-CM | POA: Diagnosis not present

## 2017-01-20 DIAGNOSIS — S92001D Unspecified fracture of right calcaneus, subsequent encounter for fracture with routine healing: Secondary | ICD-10-CM | POA: Diagnosis not present

## 2017-01-20 DIAGNOSIS — S92301D Fracture of unspecified metatarsal bone(s), right foot, subsequent encounter for fracture with routine healing: Secondary | ICD-10-CM | POA: Diagnosis not present

## 2017-02-03 DIAGNOSIS — S92301D Fracture of unspecified metatarsal bone(s), right foot, subsequent encounter for fracture with routine healing: Secondary | ICD-10-CM | POA: Diagnosis not present

## 2017-02-03 DIAGNOSIS — S92001D Unspecified fracture of right calcaneus, subsequent encounter for fracture with routine healing: Secondary | ICD-10-CM | POA: Diagnosis not present

## 2017-02-05 ENCOUNTER — Encounter (INDEPENDENT_AMBULATORY_CARE_PROVIDER_SITE_OTHER): Payer: Medicare Other | Admitting: Physical Medicine and Rehabilitation

## 2017-03-03 DIAGNOSIS — S92301D Fracture of unspecified metatarsal bone(s), right foot, subsequent encounter for fracture with routine healing: Secondary | ICD-10-CM | POA: Diagnosis not present

## 2017-03-03 DIAGNOSIS — S92001D Unspecified fracture of right calcaneus, subsequent encounter for fracture with routine healing: Secondary | ICD-10-CM | POA: Diagnosis not present

## 2017-04-07 DIAGNOSIS — S92301D Fracture of unspecified metatarsal bone(s), right foot, subsequent encounter for fracture with routine healing: Secondary | ICD-10-CM | POA: Diagnosis not present

## 2017-04-07 DIAGNOSIS — S92001A Unspecified fracture of right calcaneus, initial encounter for closed fracture: Secondary | ICD-10-CM | POA: Diagnosis not present

## 2017-04-17 IMAGING — MR MR HEAD W/O CM
6 of 10 series · 19 of 48 positions shown · non-contrast
Comparison: [facility] [[facility]OS[facility]] brain MRI report 02/21/2011
(no images available).

CLINICAL DATA: 81-year-old female with dysarthria and facial
deficits suspicious for TIA. Intermittent left face and tongue
numbness for 1 month. Initial encounter.

EXAM:
MRI HEAD WITHOUT CONTRAST
MRA HEAD WITHOUT CONTRAST
TECHNIQUE: Multiplanar, multiecho pulse sequences of the brain and surrounding
structures were obtained without intravenous contrast. Angiographic
images of the head were obtained using MRA technique without
contrast.

[Series 3: T1 · sagittal · 5.0mm · 0.41mm/px · 2 of 21 slices shown (1 of 2)]
[im 1/21]
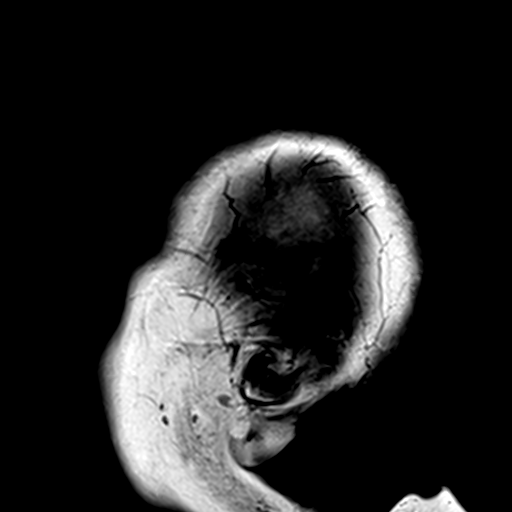
[im 21/21]
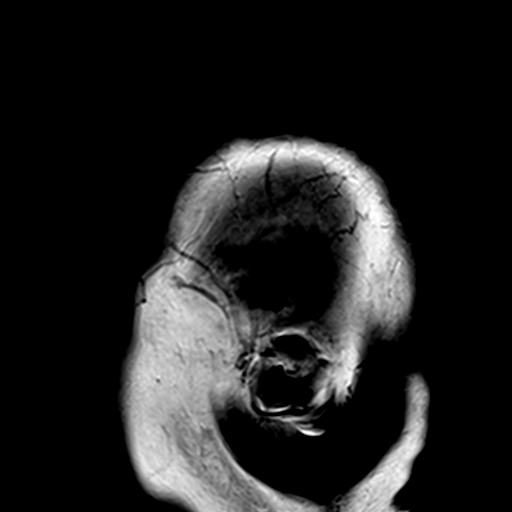

[Series 4: T2 · axial · 5.0mm · 0.46mm/px · z∈[-24,+115]mm · 2 of 23 slices shown (1 of 2)]
[im 1/23]
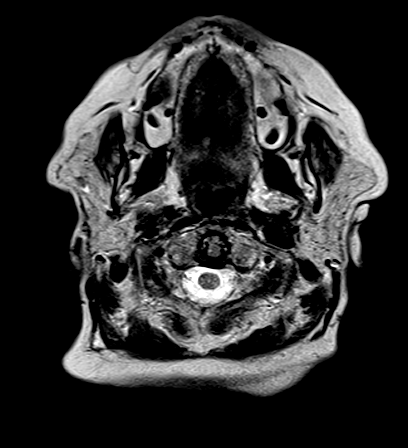
[im 23/23]
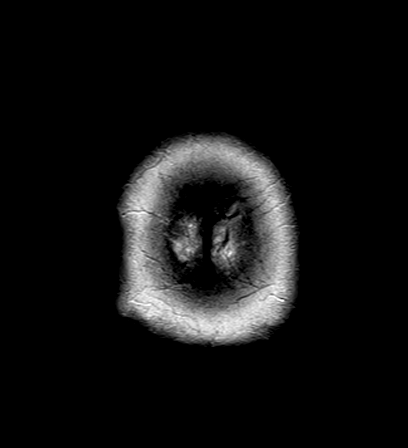

[Series 5: FLAIR · axial · 5.0mm · 0.33mm/px · z∈[-23,+116]mm · 3 of 23 slices shown]
[im 1/23]
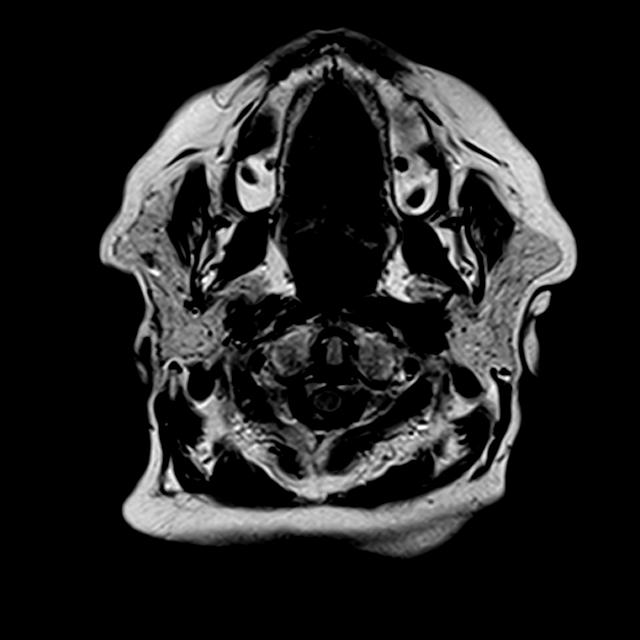
[im 12/23]
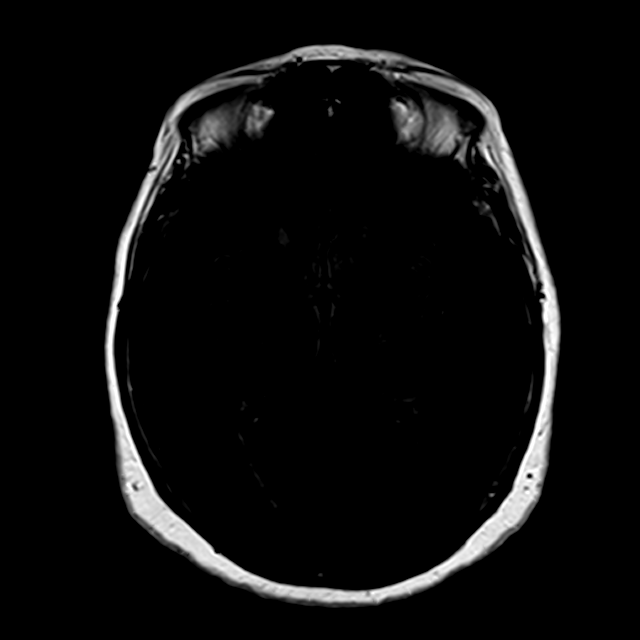
[im 23/23]
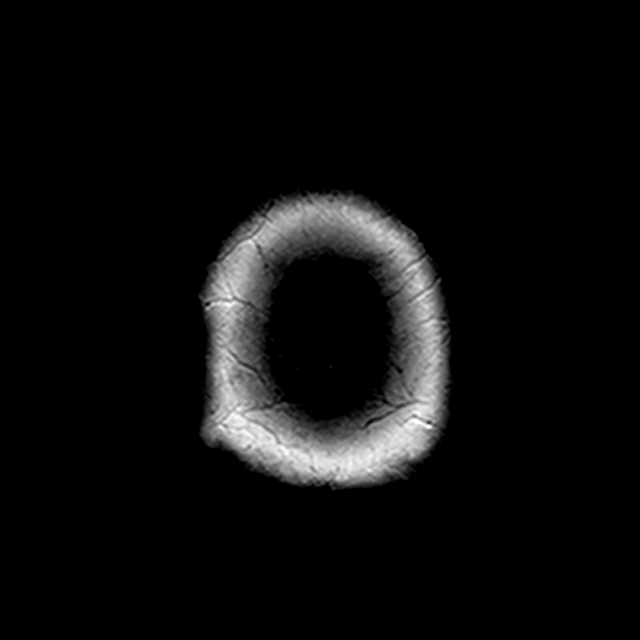

[Series 6: T1 · axial · 2.0mm · 0.40mm/px · z∈[-23,+111]mm · 8 of 70 slices shown (2 of 2)]
[im 1/70]
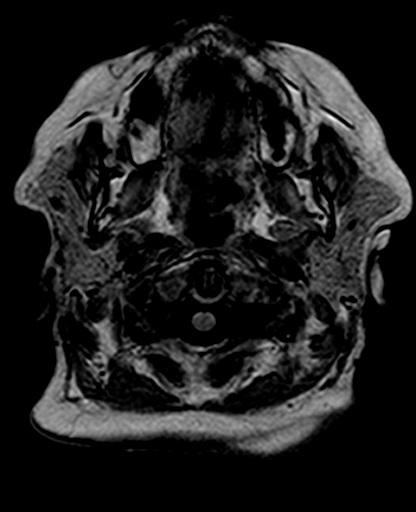
[im 9/70]
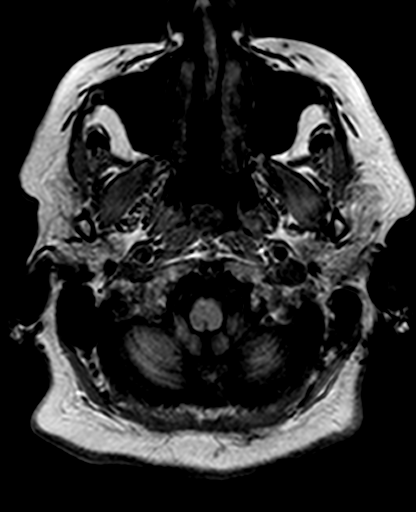
[im 18/70]
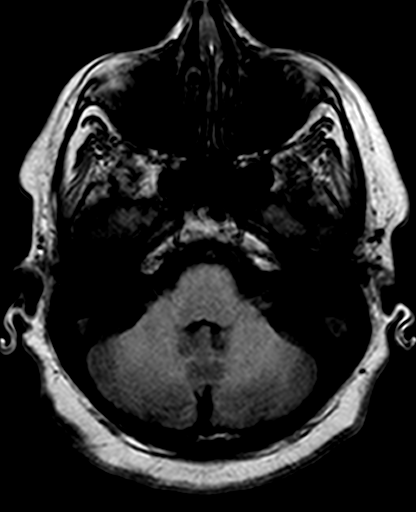
[im 26/70]
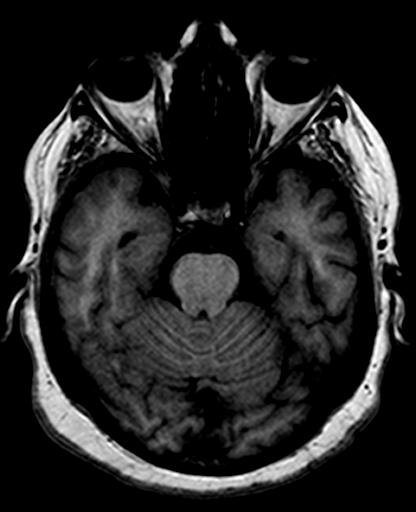
[im 44/70]
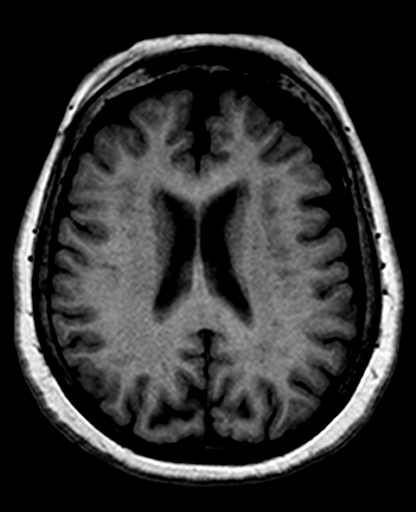
[im 52/70]
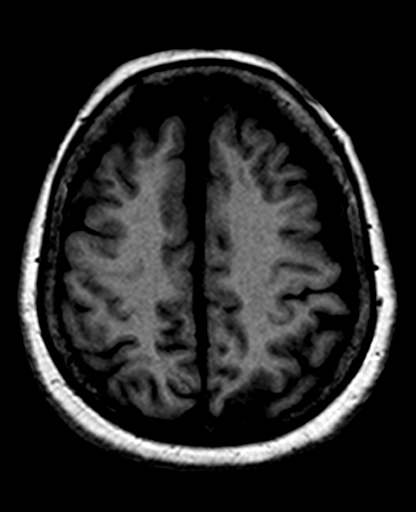
[im 61/70]
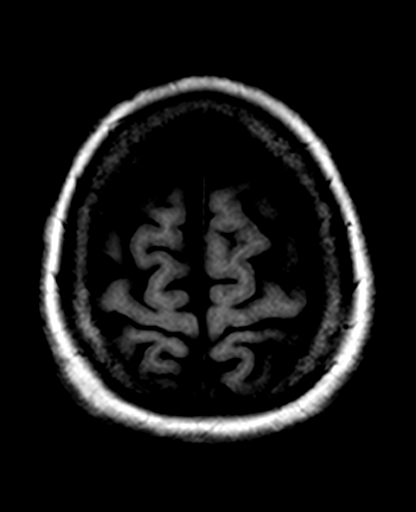
[im 70/70]
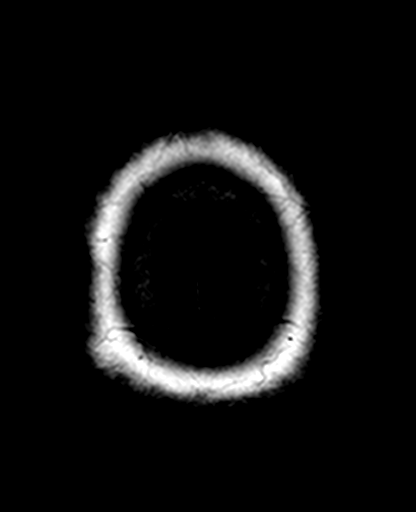

[Series 7: trauma axial · axial · 5.0mm · 0.40mm/px · 1 of 23 slices shown]
[im 1/23]
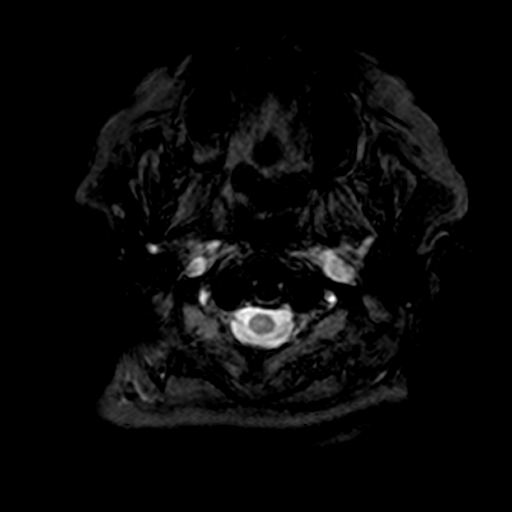

[Series 8: T2 · coronal · 5.0mm · 0.44mm/px · 3 of 26 slices shown (2 of 2)]
[im 1/26]
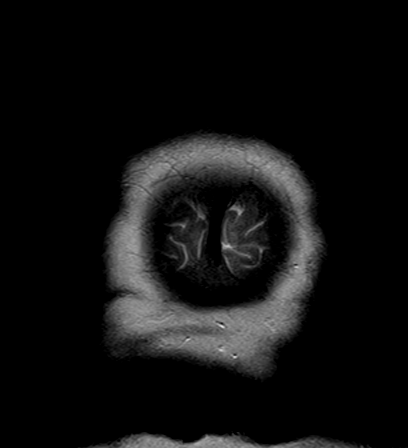
[im 13/26]
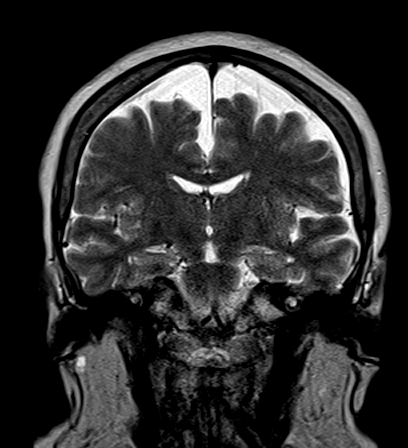
[im 26/26]
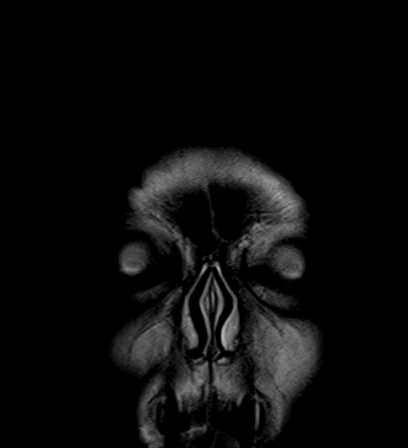

[19 of 48 positions shown; findings below may reference images not displayed]

FINDINGS: MRI HEAD FINDINGS

No restricted diffusion or evidence of acute infarction. Major
intracranial vascular flow voids are preserved.

Cerebral volume is within normal limits for age. No midline shift,
mass effect, evidence of mass lesion, ventriculomegaly, extra-axial
collection or acute intracranial hemorrhage. Cervicomedullary
junction and pituitary are within normal limits. CSF pulsation
artifact suspected in the left subarachnoid space at the anterior
insula (series 5 image 13), with no signal abnormality there on any
other sequence. Gray and white matter signal is within normal limits
for age throughout the brain. No cortical encephalomalacia or
definite chronic cerebral blood products.

Visible internal auditory structures appear normal. Mastoids are
clear. Trace paranasal sinus mucosal thickening. Orbit and face soft
tissues appear normal.

There is a smooth polypoid soft tissue lesion in the midline of the
nasopharynx measuring 13-14 mm diameter (sagittal image 11). This
does demonstrate increased diffusion (series 100, image 4). It does
not resemble typical adenoid hypertrophy or Tornwaldt cyst.

Otherwise no skullbase abnormality identified. Normal stylomastoid
foramina. Normal bone marrow signal. Negative visualized cervical
spine.

MRA HEAD FINDINGS

Antegrade flow in the posterior circulation with codominant distal
vertebral arteries. Normal PICA origins. Normal vertebrobasilar
junction. No basilar stenosis. SCA and right PCA origins are normal.
Posterior communicating arteries are diminutive or absent.

The left PCA appears occluded just beyond its origin in the P1
segment. No distal reconstituted left PCA flow signal identified.
Right PCA branches appear normal.

Antegrade flow in both ICA siphons. There is irregularity of the
distal cervical ICAs greater on the left. The left ICA also appears
to have the retropharyngeal course.

Antegrade flow in both ICA siphons. No siphon stenosis. Ophthalmic
artery origins are within normal limits. Bilateral carotid termini
are patent. MCA and ACA origins are within normal limits.

Anterior communicating artery and visualized ACA branches are within
normal limits. Visualized right MCA branches are within normal
limits.

The left MCA M1 segment is within normal limits. There is an early
left MCA bifurcation. There are high-grade stenoses in 2 posterior
MCA M2 segment divisions with preserved distal flow. See 109 image
14. The anterior M2 branch and other left MCA branches are within
normal limits.
IMPRESSION: 1. No acute infarct and largely unremarkable for age noncontrast MRI
appearance of the brain.
2. Left PCA occlusion, presumably chronic in light of no acute MRI
findings in that vascular territory.
3. High-grade stenosis in the proximal aspects of two left MCA
posterior M2 branches.
4. Polypoid up to 1.4 cm soft tissue lesion in the midline at the
nasopharynx, not typical in appearance for Tornwaldt cyst, but
appearance does favor benign etiology. Recommend ENT follow-up.
Study discussed by telephone with Dr. KARINA ABE on 02/20/2015
at 0103 hours.

## 2017-07-27 ENCOUNTER — Ambulatory Visit (INDEPENDENT_AMBULATORY_CARE_PROVIDER_SITE_OTHER): Payer: Medicare Other | Admitting: Family Medicine

## 2017-07-27 ENCOUNTER — Encounter: Payer: Self-pay | Admitting: Family Medicine

## 2017-07-27 VITALS — BP 136/77 | HR 78 | Temp 97.4°F | Ht 61.0 in | Wt 141.0 lb

## 2017-07-27 DIAGNOSIS — I10 Essential (primary) hypertension: Secondary | ICD-10-CM

## 2017-07-27 DIAGNOSIS — Z8673 Personal history of transient ischemic attack (TIA), and cerebral infarction without residual deficits: Secondary | ICD-10-CM | POA: Diagnosis not present

## 2017-07-27 DIAGNOSIS — M19039 Primary osteoarthritis, unspecified wrist: Secondary | ICD-10-CM | POA: Diagnosis not present

## 2017-07-27 DIAGNOSIS — K219 Gastro-esophageal reflux disease without esophagitis: Secondary | ICD-10-CM

## 2017-07-27 DIAGNOSIS — J302 Other seasonal allergic rhinitis: Secondary | ICD-10-CM

## 2017-07-27 MED ORDER — OMEPRAZOLE 20 MG PO CPDR: 20.0000 mg | DELAYED_RELEASE_CAPSULE | ORAL | 3 refills | Status: DC | PRN

## 2017-07-27 MED ORDER — VOLTAREN 1 % TD GEL: TRANSDERMAL | 4 refills | Status: DC

## 2017-07-27 MED ORDER — FLUTICASONE PROPIONATE 50 MCG/ACT NA SUSP: 1.0000 | Freq: Two times a day (BID) | NASAL | 6 refills | Status: DC | PRN

## 2017-07-27 MED ORDER — LORATADINE 10 MG PO TABS: 10.0000 mg | ORAL_TABLET | Freq: Every day | ORAL | 3 refills | Status: DC | PRN

## 2017-07-27 MED ORDER — CYCLOBENZAPRINE HCL 10 MG PO TABS: 10.0000 mg | ORAL_TABLET | Freq: Every evening | ORAL | 5 refills | Status: DC | PRN

## 2017-07-27 NOTE — Progress Notes (Signed)
BP 136/77   Pulse 78   Temp (!) 97.4 F (36.3 C) (Oral)   Ht 5' 1" (1.549 m)   Wt 141 lb (64 kg)   BMI 26.64 kg/m    Subjective:    Patient ID: [name], female    DOB: 1932/06/12, 84 y.o.   MRN: [id number]  HPI: [name] is a 84 y.o. female presenting on 07/27/2017 for Hypertension   HPI Hypertension Patient is currently on no medication for at least 2 weeks, she had been on hydrochlorothiazide previously, and their blood pressure today is 136/77. Patient denies any lightheadedness or dizziness. Patient denies headaches, blurred vision, chest pains, shortness of breath, or weakness. Denies any side effects from medication and is content with current medication.  Patient does have a history of TIA  GERD Patient is currently on Omeprazole, she has been out of it for 2 weeks and she is feeling the burning.  She denies any major symptoms or abdominal pain or belching or burping. She denies any blood in her stool or lightheadedness or dizziness.   Seasonal allergies Patient is been having a scratchy irritation in the back of her throat over the past 3 or 4 weeks.  She does admit that she ran over her allergy medications and has not been taking any of her medications because she ran out of all of them.  She denies any shortness of breath or wheezing or fevers or chills.  She says this mostly just that irritation or tickle in the back of her throat.  She does not feel like it the pain.  She has been having some nasal drainage sinus drainage but no real pain or headaches.  She has had a little bit of a cough because of the tickle in the back of her throat.  Patient needs a refill of her medications for arthritis which have been working for her.  Relevant past medical, surgical, family and social history reviewed and updated as indicated. Interim medical history since our last visit reviewed. Allergies and medications reviewed and updated.  Review of Systems  Constitutional:  Negative for chills and fever.  HENT: Positive for congestion and sneezing. Negative for ear discharge, ear pain, sinus pressure, sinus pain and sore throat.   Eyes: Negative for redness and visual disturbance.  Respiratory: Positive for cough. Negative for chest tightness and shortness of breath.   Cardiovascular: Negative for chest pain and leg swelling.  Genitourinary: Negative for difficulty urinating and dysuria.  Musculoskeletal: Positive for arthralgias and back pain. Negative for gait problem.  Skin: Negative for rash.  Neurological: Negative for light-headedness and headaches.  Psychiatric/Behavioral: Negative for agitation and behavioral problems.  All other systems reviewed and are negative.   Per HPI unless specifically indicated above   Allergies as of 07/27/2017      Reactions   Lisinopril Swelling   Tongue swelling      Medication List        Accurate as of 07/27/17 12:00 PM. Always use your most recent med list.          cyclobenzaprine 10 MG tablet Commonly known as:  FLEXERIL Take 1 tablet (10 mg total) by mouth at bedtime as needed for muscle spasms.   fluticasone 50 MCG/ACT nasal spray Commonly known as:  FLONASE Place 1 spray into both nostrils 2 (two) times daily as needed for allergies or rhinitis.   ibuprofen 200 MG tablet Commonly known as:  ADVIL,MOTRIN Take 200 mg by mouth every 6 (  six) hours as needed for moderate pain.   loratadine 10 MG tablet Commonly known as:  CLARITIN Take 1 tablet (10 mg total) by mouth daily as needed for allergies.   omeprazole 20 MG capsule Commonly known as:  PRILOSEC Take 1 capsule (20 mg total) by mouth as needed (acid reflux.).   VOLTAREN 1 % Gel Generic drug:  diclofenac sodium 100APPLY 1 GRAM TO AFFECTED AREA 4 TIMES DAILY AS DIRECTED          Objective:    BP 136/77   Pulse 78   Temp (!) 97.4 F (36.3 C) (Oral)   Ht 5' 1" (1.549 m)   Wt 141 lb (64 kg)   BMI 26.64 kg/m   Wt Readings from Last  3 Encounters:  07/27/17 141 lb (64 kg)  12/04/16 146 lb (66.2 kg)  10/03/16 146 lb (66.2 kg)    Physical Exam  Constitutional: She is oriented to person, place, and time. She appears well-developed and well-nourished. No distress.  HENT:  Right Ear: External ear normal.  Left Ear: External ear normal.  Nose: Nose normal.  Mouth/Throat: Oropharynx is clear and moist. No oropharyngeal exudate.  Eyes: Conjunctivae are normal.  Neck: Neck supple. No thyromegaly present.  Cardiovascular: Normal rate, regular rhythm, normal heart sounds and intact distal pulses.  No murmur heard. Pulmonary/Chest: Effort normal and breath sounds normal. No respiratory distress. She has no wheezes. She has no rales.  Musculoskeletal: Normal range of motion. She exhibits no edema or tenderness (No tenderness to palpation on exam).  Lymphadenopathy:    She has no cervical adenopathy.  Neurological: She is alert and oriented to person, place, and time. Coordination normal.  Skin: Skin is warm and dry. No rash noted. She is not diaphoretic.  Psychiatric: She has a normal mood and affect. Her behavior is normal.  Nursing note and vitals reviewed.       Assessment & Plan:   Problem List Items Addressed This Visit      Cardiovascular and Mediastinum   HTN (hypertension) - Primary   Relevant Orders   CMP14+EGFR   CBC with Differential/Platelet   Lipid panel     Other   History of TIA (transient ischemic attack)   Relevant Orders   CBC with Differential/Platelet   Lipid panel    Other Visit Diagnoses    Seasonal allergies       Relevant Medications   loratadine (CLARITIN) 10 MG tablet   fluticasone (FLONASE) 50 MCG/ACT nasal spray   Wrist arthritis       Relevant Medications   cyclobenzaprine (FLEXERIL) 10 MG tablet   VOLTAREN 1 % GEL   Gastroesophageal reflux disease without esophagitis       Relevant Medications   omeprazole (PRILOSEC) 20 MG capsule       Follow up plan: Return in about  6 months (around 01/26/2018), or if symptoms worsen or fail to improve, for Hypertension recheck.  Counseling provided for all of the vaccine components Orders Placed This Encounter  Procedures  . CMP14+EGFR  . CBC with Differential/Platelet  . Lipid panel    Caryl Pina, MD [facility] 07/27/2017, 12:00 PM

## 2017-07-28 ENCOUNTER — Telehealth: Payer: Self-pay

## 2017-07-28 LAB — CBC WITH DIFFERENTIAL/PLATELET
BASOS ABS: 0.1 10*3/uL (ref 0.0–0.2)
Basos: 1 %
EOS (ABSOLUTE): 0.4 10*3/uL (ref 0.0–0.4)
Eos: 4 %
HEMOGLOBIN: 12.7 g/dL (ref 11.1–15.9)
Hematocrit: 38.2 % (ref 34.0–46.6)
Immature Grans (Abs): 0 10*3/uL (ref 0.0–0.1)
Immature Granulocytes: 0 %
LYMPHS ABS: 2.5 10*3/uL (ref 0.7–3.1)
Lymphs: 29 %
MCH: 28.6 pg (ref 26.6–33.0)
MCHC: 33.2 g/dL (ref 31.5–35.7)
MCV: 86 fL (ref 79–97)
MONOCYTES: 4 %
Monocytes Absolute: 0.4 10*3/uL (ref 0.1–0.9)
NEUTROS ABS: 5.2 10*3/uL (ref 1.4–7.0)
Neutrophils: 62 %
Platelets: 352 10*3/uL (ref 150–379)
RBC: 4.44 x10E6/uL (ref 3.77–5.28)
RDW: 14.7 % (ref 12.3–15.4)
WBC: 8.5 10*3/uL (ref 3.4–10.8)

## 2017-07-28 LAB — CMP14+EGFR
A/G RATIO: 1.3 (ref 1.2–2.2)
ALBUMIN: 4.1 g/dL (ref 3.5–4.7)
ALT: 16 IU/L (ref 0–32)
AST: 24 IU/L (ref 0–40)
Alkaline Phosphatase: 70 IU/L (ref 39–117)
BILIRUBIN TOTAL: 0.3 mg/dL (ref 0.0–1.2)
BUN / CREAT RATIO: 18 (ref 12–28)
BUN: 14 mg/dL (ref 8–27)
CHLORIDE: 105 mmol/L (ref 96–106)
CO2: 22 mmol/L (ref 20–29)
Calcium: 9.4 mg/dL (ref 8.7–10.3)
Creatinine, Ser: 0.78 mg/dL (ref 0.57–1.00)
GFR calc non Af Amer: 71 mL/min/{1.73_m2} (ref >59)
GFR, EST AFRICAN AMERICAN: 82 mL/min/{1.73_m2} (ref >59)
Globulin, Total: 3.2 g/dL (ref 1.5–4.5)
Glucose: 91 mg/dL (ref 65–99)
POTASSIUM: 4.2 mmol/L (ref 3.5–5.2)
Sodium: 144 mmol/L (ref 134–144)
TOTAL PROTEIN: 7.3 g/dL (ref 6.0–8.5)

## 2017-07-28 LAB — LIPID PANEL
CHOL/HDL RATIO: 6.9 ratio — AB (ref 0.0–4.4)
CHOLESTEROL TOTAL: 271 mg/dL — AB (ref 100–199)
HDL: 39 mg/dL — ABNORMAL LOW (ref >39)
LDL CALC: 196 mg/dL — AB (ref 0–99)
Triglycerides: 182 mg/dL — ABNORMAL HIGH (ref 0–149)
VLDL Cholesterol Cal: 36 mg/dL (ref 5–40)

## 2017-07-28 NOTE — Telephone Encounter (Signed)
Diclofenac gel not covered  Alternatives are Meloxicam, Ibuprofen and Naprosyn

## 2017-07-29 MED ORDER — LIDOCAINE HCL 2 % EX GEL: 1.0000 "application " | Freq: Two times a day (BID) | CUTANEOUS | 2 refills | Status: DC | PRN

## 2017-07-29 NOTE — Addendum Note (Signed)
Addended by: Caryl Pina on: 07/29/2017 01:00 PM   Modules accepted: Orders

## 2017-07-29 NOTE — Telephone Encounter (Signed)
Voltaren gel was not covered, I sent lidocaine gel for her

## 2017-08-06 DIAGNOSIS — H353131 Nonexudative age-related macular degeneration, bilateral, early dry stage: Secondary | ICD-10-CM | POA: Insufficient documentation

## 2017-08-06 DIAGNOSIS — Z961 Presence of intraocular lens: Secondary | ICD-10-CM | POA: Diagnosis not present

## 2017-08-11 ENCOUNTER — Encounter: Payer: Self-pay | Admitting: *Deleted

## 2017-08-21 ENCOUNTER — Encounter (INDEPENDENT_AMBULATORY_CARE_PROVIDER_SITE_OTHER): Payer: Medicare Other | Admitting: Ophthalmology

## 2017-08-21 DIAGNOSIS — H4423 Degenerative myopia, bilateral: Secondary | ICD-10-CM | POA: Diagnosis not present

## 2017-08-21 DIAGNOSIS — I10 Essential (primary) hypertension: Secondary | ICD-10-CM | POA: Diagnosis not present

## 2017-08-21 DIAGNOSIS — H43813 Vitreous degeneration, bilateral: Secondary | ICD-10-CM

## 2017-08-21 DIAGNOSIS — H35033 Hypertensive retinopathy, bilateral: Secondary | ICD-10-CM | POA: Diagnosis not present

## 2017-08-28 ENCOUNTER — Telehealth: Payer: Self-pay | Admitting: Family Medicine

## 2017-08-28 MED ORDER — ATORVASTATIN CALCIUM 20 MG PO TABS: 20.0000 mg | ORAL_TABLET | Freq: Every day | ORAL | 3 refills | Status: DC

## 2017-08-28 NOTE — Telephone Encounter (Signed)
lipitor sent to pharmacy.  Lmtcb

## 2017-09-08 DIAGNOSIS — H26492 Other secondary cataract, left eye: Secondary | ICD-10-CM | POA: Diagnosis not present

## 2017-09-08 DIAGNOSIS — Z961 Presence of intraocular lens: Secondary | ICD-10-CM | POA: Diagnosis not present

## 2017-09-23 DIAGNOSIS — H26492 Other secondary cataract, left eye: Secondary | ICD-10-CM | POA: Diagnosis not present

## 2017-09-29 DIAGNOSIS — M79671 Pain in right foot: Secondary | ICD-10-CM | POA: Diagnosis not present

## 2017-09-29 DIAGNOSIS — M79672 Pain in left foot: Secondary | ICD-10-CM | POA: Diagnosis not present

## 2017-09-29 DIAGNOSIS — M25571 Pain in right ankle and joints of right foot: Secondary | ICD-10-CM | POA: Diagnosis not present

## 2017-09-29 DIAGNOSIS — M25572 Pain in left ankle and joints of left foot: Secondary | ICD-10-CM | POA: Diagnosis not present

## 2017-11-19 ENCOUNTER — Telehealth: Payer: Self-pay | Admitting: Family Medicine

## 2017-11-24 ENCOUNTER — Ambulatory Visit (INDEPENDENT_AMBULATORY_CARE_PROVIDER_SITE_OTHER): Payer: Medicare Other | Admitting: Family Medicine

## 2017-11-24 ENCOUNTER — Encounter: Payer: Self-pay | Admitting: Family Medicine

## 2017-11-24 VITALS — BP 140/65 | HR 72 | Temp 98.1°F | Ht 61.0 in | Wt 138.0 lb

## 2017-11-24 DIAGNOSIS — M2550 Pain in unspecified joint: Secondary | ICD-10-CM | POA: Diagnosis not present

## 2017-11-24 MED ORDER — DICLOFENAC SODIUM 75 MG PO TBEC: 75.0000 mg | DELAYED_RELEASE_TABLET | Freq: Two times a day (BID) | ORAL | 0 refills | Status: DC

## 2017-11-24 NOTE — Progress Notes (Signed)
Subjective: CC: wrist pain PCP: Dettinger, Fransisca Kaufmann, MD [id number] Boak is a 84 y.o. female presenting to clinic today for:  [employer] video interpreter Antony Haste, [place] used for Spanish translation of this visit   1. Wrist pain Patient reports onset of bilateral wrist pain 3 days ago.  She notes the left is worse than right.  She reports associated swelling and decreased active range of motion secondary to pain.  Denies any preceding injury or change in activity.  No history of wrist pain.  No recent illness.  No associated numbness or tingling.  She does report decreased grip bilaterally but left worse than right.  She has not used any topicals or medications to help with symptoms.  No known history of autoimmune arthritis.  No family history of autoimmune diseases.  ROS: Per HPI  Allergies  Allergen Reactions  . Lisinopril Swelling    Tongue swelling   Past Medical History:  Diagnosis Date  . Arthritis of right foot   . Hx: UTI (urinary tract infection)   . Hypertension   . Palpitations   . Unsteady gait     Current Outpatient Medications:  .  omeprazole (PRILOSEC) 20 MG capsule, Take 1 capsule (20 mg total) by mouth as needed (acid reflux.)., Disp: 90 capsule, Rfl: 3 Social History   Socioeconomic History  . Marital status: Married    Spouse name: Not on file  . Number of children: 4  . Years of education: 4th grade  . Highest education level: Not on file  Occupational History  . Occupation: Agricultural engineer  Social Needs  . Financial resource strain: Not on file  . Food insecurity:    Worry: Not on file    Inability: Not on file  . Transportation needs:    Medical: Not on file    Non-medical: Not on file  Tobacco Use  . Smoking status: Never Smoker  . Smokeless tobacco: Never Used  Substance and Sexual Activity  . Alcohol use: No    Alcohol/week: 0.0 oz  . Drug use: No  . Sexual activity: Not on file  Lifestyle  . Physical activity:    Days per week: Not on  file    Minutes per session: Not on file  . Stress: Not on file  Relationships  . Social connections:    Talks on phone: Not on file    Gets together: Not on file    Attends religious service: Not on file    Active member of club or organization: Not on file    Attends meetings of clubs or organizations: Not on file    Relationship status: Not on file  . Intimate partner violence:    Fear of current or ex partner: Not on file    Emotionally abused: Not on file    Physically abused: Not on file    Forced sexual activity: Not on file  Other Topics Concern  . Not on file  Social History Narrative   Lives at home with son and husband.     1 cup coffee per day.   Right-handed.   Family History  Problem Relation Age of Onset  . Heart attack Mother 78       Died in [place]  . Allergic rhinitis Neg Hx   . Angioedema Neg Hx   . Asthma Neg Hx   . Eczema Neg Hx   . Immunodeficiency Neg Hx   . Urticaria Neg Hx     Objective: Office vital signs  reviewed. BP 140/65   Pulse 72   Temp 98.1 F (36.7 C) (Oral)   Ht 5\' 1"  (1.549 m)   Wt 138 lb (62.6 kg)   BMI 26.07 kg/m   Physical Examination:  General: Awake, alert, well nourished, No acute distress Extremities: warm, well perfused, No cyanosis or clubbing; +2 pulses bilaterally MSK: uses walker for ambulation  Left wrist: Patient has limited active range of motion secondary to pain and swelling.  She has a visible soft tissue swelling within the wrist.  There is no specific tenderness to palpation or palpable bony abnormalities.  She has decreased handgrip in the left and right but worse on the left.  Negative Tinel's.  Right wrist: Patient has minimally limited active range of motion in the right wrist.  No soft tissue swelling or joint effusion noted.  No tenderness to palpation to the wrist.  She has decreased handgrip but is stronger on the right compared to left. Skin: dry; intact; no rashes or lesions Neuro: light touch  sensation grossly in tact  Assessment/ Plan: 84 y.o. female   1. Polyarthralgia Difficult to tell etiology of wrist pain.  She certainly has quite a bit of swelling on the left compared to the right but pain is bilateral.  Since she has not used any therapies for this issue, will place on oral NSAID.  Diclofenac 75 mg p.o. twice daily for the next 2 weeks prescribed.  Take with food.  Avoid other NSAIDs.  I cautioned her about GI bleed given age.  No history of GI bleed or ulcers in the past.  We will also obtain autoimmune arthritis labs.  Will contact patient with results once available.  I did recommend that she follow-up with PCP within the next 7 to 10 days for recheck.  If symptoms are persistent, would consider x-ray and possible referral to specialist.  An interpreter was used for this evaluation and instructions were reviewed with the patient in [place].  Reasons for return discussed.  Follow-up as scheduled. - ANA w/Reflex if Positive - C-reactive protein - Sedimentation Rate - Rheumatoid factor - CBC with Differential  Orders Placed This Encounter  Procedures  . ANA w/Reflex if Positive  . C-reactive protein  . Sedimentation Rate  . Rheumatoid factor  . CBC with Differential   Meds ordered this encounter  Medications  . diclofenac (VOLTAREN) 75 MG EC tablet    Sig: Take 1 tablet (75 mg total) by mouth 2 (two) times daily. (if needed for pain or swelling)    Dispense:  30 tablet    Refill:  0    Please put in [place], [place] [phone]

## 2017-11-24 NOTE — Patient Instructions (Signed)
  He ordenado a los laboratorios que lo evalen por artritis autoinmune. Te llamar con los [employer]. Mientras tanto, le he recetado un medicamento para el dolor para ayudar con la inflamacin. NO tome esto con ningn otro medicamento para el dolor. Haga un seguimiento con el Dr. Warrick Parisian en [place] para volver a Musician.  Si sus sntomas no mejoran, es posible que necesitemos derivarlo al especialista en articulaciones.   Artritis (Arthritis) El significado del trmino artritis es Social research officer, government de las articulaciones. Tambin puede significar enfermedad articular. Una articulacin es TEFL teacher de unin de [employer]. Las personas que sufren artritis pueden tener lo siguiente:  Enrojecimiento de las articulaciones.  Inflamaciones articulares.  Articulaciones rgidas.  Articulaciones calientes.  Cristy Hilts.  Sensacin de estar enfermo. CUIDADOS EN EL HOGAR Est atento a cualquier cambio en los sntomas. Tome estas medidas para Best boy y la hinchazn. Medicamentos  [employer] de venta libre y los recetados solamente como se lo haya indicado el mdico.  No tome aspirina para Best boy si el mdico le dice que puede tener gota. Actividades  Ponga la articulacin en reposo si el mdico se lo indica.  Evite las actividades que intensifiquen Conservation officer, historic buildings.  Haga ejercicios con la articulacin como se lo haya indicado el mdico. Intente hacer ejercicios tales como: ? Natacin. ? Gimnasia acutica. ? Andar en bicicleta. ? Caminar. Cuidado de la articulacin  Si la articulacin est hinchada, mantngala elevada si el mdico se lo indic.  Si al despertar por la maana, nota que la articulacin est rgida, intente tomar una ducha con agua tibia.  Si es diabtico, no se ponga calor sin consultarle al mdico.  Si se lo indican, pngase calor en la articulacin: ? Coloque una toalla entre la articulacin y la compresa caliente o la almohadilla  trmica. ? Coloque el calor en la zona durante 20 o 72minutos.  Si se lo indican, pngase hielo en la articulacin: ? Ponga el hielo en una bolsa plstica. ? Coloque una [employer] piel y la bolsa de hielo. ? Coloque el hielo durante 67minutos, 2 a 3veces por da.  Concurra a todas las visitas de control como se lo haya indicado el mdico. SOLICITE AYUDA SI:  El dolor empeora.  Tiene fiebre. SOLICITE AYUDA DE INMEDIATO SI:  Siente un dolor muy intenso en la articulacin.  Se le hincha la articulacin.  La articulacin est enrojecida.  Siente dolor en muchas articulaciones y se le hinchan.  Siente un dolor muy intenso en la espalda.  Tiene la pierna muy dbil.  No puede controlar la miccin (orina) o la defecacin (materia fecal). Esta informacin no tiene Marine scientist el consejo del mdico. Asegrese de hacerle al mdico cualquier pregunta que tenga. Document Released: 10/07/2011 Document Revised: 07/30/2015 Document Reviewed: 07/03/2014 Elsevier Interactive Patient Education  [employer].

## 2017-11-25 ENCOUNTER — Other Ambulatory Visit: Payer: Self-pay | Admitting: Family Medicine

## 2017-11-25 LAB — CBC WITH DIFFERENTIAL/PLATELET
Basophils Absolute: 0 10*3/uL (ref 0.0–0.2)
Basos: 1 %
EOS (ABSOLUTE): 0.2 10*3/uL (ref 0.0–0.4)
EOS: 3 %
HEMATOCRIT: 37.8 % (ref 34.0–46.6)
HEMOGLOBIN: 12.2 g/dL (ref 11.1–15.9)
Immature Grans (Abs): 0 10*3/uL (ref 0.0–0.1)
Immature Granulocytes: 0 %
LYMPHS ABS: 1.7 10*3/uL (ref 0.7–3.1)
Lymphs: 23 %
MCH: 28.4 pg (ref 26.6–33.0)
MCHC: 32.3 g/dL (ref 31.5–35.7)
MCV: 88 fL (ref 79–97)
MONOCYTES: 9 %
Monocytes Absolute: 0.7 10*3/uL (ref 0.1–0.9)
NEUTROS ABS: 5 10*3/uL (ref 1.4–7.0)
Neutrophils: 64 %
Platelets: 310 10*3/uL (ref 150–450)
RBC: 4.3 x10E6/uL (ref 3.77–5.28)
RDW: 14.7 % (ref 12.3–15.4)
WBC: 7.7 10*3/uL (ref 3.4–10.8)

## 2017-11-25 LAB — ANA W/REFLEX IF POSITIVE: ANA: NEGATIVE

## 2017-11-25 LAB — C-REACTIVE PROTEIN: CRP: 62 mg/L — ABNORMAL HIGH (ref 0–10)

## 2017-11-25 LAB — RHEUMATOID FACTOR: Rhuematoid fact SerPl-aCnc: 60.1 IU/mL — ABNORMAL HIGH (ref 0.0–13.9)

## 2017-11-25 LAB — SEDIMENTATION RATE: Sed Rate: 26 mm/hr (ref 0–40)

## 2017-11-25 MED ORDER — PREDNISONE 10 MG (21) PO TBPK: ORAL_TABLET | ORAL | 0 refills | Status: DC

## 2017-12-03 ENCOUNTER — Encounter: Payer: Self-pay | Admitting: Family Medicine

## 2017-12-03 ENCOUNTER — Ambulatory Visit (INDEPENDENT_AMBULATORY_CARE_PROVIDER_SITE_OTHER): Payer: Medicare Other | Admitting: Family Medicine

## 2017-12-03 VITALS — BP 154/74 | HR 66 | Temp 97.2°F | Ht 61.0 in | Wt 142.0 lb

## 2017-12-03 DIAGNOSIS — M2550 Pain in unspecified joint: Secondary | ICD-10-CM | POA: Diagnosis not present

## 2017-12-03 DIAGNOSIS — M083 Juvenile rheumatoid polyarthritis (seronegative): Secondary | ICD-10-CM | POA: Diagnosis not present

## 2017-12-03 DIAGNOSIS — M0580 Other rheumatoid arthritis with rheumatoid factor of unspecified site: Secondary | ICD-10-CM

## 2017-12-03 MED ORDER — DICLOFENAC SODIUM 75 MG PO TBEC
75.0000 mg | DELAYED_RELEASE_TABLET | Freq: Two times a day (BID) | ORAL | 1 refills | Status: DC
Start: 2017-12-03 — End: 2018-02-05

## 2017-12-03 MED ORDER — PREDNISONE 10 MG (21) PO TBPK: ORAL_TABLET | ORAL | 0 refills | Status: DC

## 2017-12-03 NOTE — Progress Notes (Signed)
BP (!) 167/78   Pulse 66   Temp (!) 97.2 F (36.2 C) (Oral)   Ht 5\' 1"  (1.549 m)   Wt 142 lb (64.4 kg)   BMI 26.83 kg/m    Subjective:    Patient ID: [name], female    DOB: 1933-04-05, 84 y.o.   MRN: [id number]  HPI: [name] is a 84 y.o. female presenting on 12/03/2017 for Follow up wrist pain   HPI Wrist pain, bilateral, worse on left and ankle pain Patient is coming in for follow-up on wrist pain and ankle pain.  She has been taking diclofenac.  She never did get the prednisone prescription that was sent in.  She says that she has been better and has been helping but not completely using the diclofenac and the Flexeril she feels like she still struggles with her left wrist and doing some actions because of the pain and soreness in that left wrist.  She denies any fevers or chills or redness or warmth.  She says sometimes her right ankle bother her enough to where it limits her as well but most of the time the pains do not limit her in her other joints.  Relevant past medical, surgical, family and social history reviewed and updated as indicated. Interim medical history since our last visit reviewed. Allergies and medications reviewed and updated.  Review of Systems  Constitutional: Negative for chills and fever.  Eyes: Negative for visual disturbance.  Respiratory: Negative for chest tightness and shortness of breath.   Cardiovascular: Negative for chest pain and leg swelling.  Musculoskeletal: Positive for arthralgias and joint swelling. Negative for back pain and gait problem.  Skin: Negative for color change and rash.  Neurological: Negative for light-headedness and headaches.  Psychiatric/Behavioral: Negative for agitation and behavioral problems.  All other systems reviewed and are negative.   Per HPI unless specifically indicated above   Allergies as of 12/03/2017      Reactions   Lisinopril Swelling   Tongue swelling      Medication List        Accurate as of 12/03/17  8:33 AM. Always use your most recent med list.          diclofenac 75 MG EC tablet Commonly known as:  VOLTAREN Take 1 tablet (75 mg total) by mouth 2 (two) times daily.   predniSONE 10 MG (21) Tbpk tablet Commonly known as:  STERAPRED UNI-PAK 21 TAB As directed x 6 days          Objective:    BP (!) 167/78   Pulse 66   Temp (!) 97.2 F (36.2 C) (Oral)   Ht 5\' 1"  (1.549 m)   Wt 142 lb (64.4 kg)   BMI 26.83 kg/m   Wt Readings from Last 3 Encounters:  12/03/17 142 lb (64.4 kg)  11/24/17 138 lb (62.6 kg)  07/27/17 141 lb (64 kg)    Physical Exam  Constitutional: She is oriented to person, place, and time. She appears well-developed and well-nourished. No distress.  Eyes: Conjunctivae are normal.  Musculoskeletal: Normal range of motion. She exhibits no edema.       Left wrist: She exhibits tenderness and swelling. She exhibits no effusion, no crepitus and no deformity.       Right ankle: She exhibits swelling. She exhibits normal range of motion. Tenderness (General tenderness).  Neurological: She is alert and oriented to person, place, and time. Coordination normal.  Skin: Skin is warm and dry. No  rash noted. She is not diaphoretic.  Psychiatric: She has a normal mood and affect. Her behavior is normal.  Nursing note and vitals reviewed.       Assessment & Plan:   Problem List Items Addressed This Visit    None    Visit Diagnoses    Polyarthralgia    -  Primary   Relevant Medications   predniSONE (STERAPRED UNI-PAK 21 TAB) 10 MG (21) TBPK tablet   diclofenac (VOLTAREN) 75 MG EC tablet   Polyarthritis with positive rheumatoid factor (HCC)       Relevant Medications   predniSONE (STERAPRED UNI-PAK 21 TAB) 10 MG (21) TBPK tablet   diclofenac (VOLTAREN) 75 MG EC tablet       Follow up plan: Return if symptoms worsen or fail to improve.  Counseling provided for all of the vaccine components No orders of the defined types were placed  in this encounter.   Caryl Pina, MD [facility] 12/03/2017, 8:33 AM

## 2017-12-29 DIAGNOSIS — M25572 Pain in left ankle and joints of left foot: Secondary | ICD-10-CM | POA: Diagnosis not present

## 2017-12-29 DIAGNOSIS — M79671 Pain in right foot: Secondary | ICD-10-CM | POA: Diagnosis not present

## 2017-12-29 DIAGNOSIS — M79672 Pain in left foot: Secondary | ICD-10-CM | POA: Diagnosis not present

## 2017-12-29 DIAGNOSIS — M25571 Pain in right ankle and joints of right foot: Secondary | ICD-10-CM | POA: Diagnosis not present

## 2018-01-27 ENCOUNTER — Ambulatory Visit: Payer: Medicare Other | Admitting: Family Medicine

## 2018-02-04 ENCOUNTER — Encounter: Payer: Self-pay | Admitting: Family Medicine

## 2018-02-05 ENCOUNTER — Ambulatory Visit (INDEPENDENT_AMBULATORY_CARE_PROVIDER_SITE_OTHER): Payer: Medicare Other | Admitting: Family

## 2018-02-05 ENCOUNTER — Encounter: Payer: Self-pay | Admitting: Family

## 2018-02-05 VITALS — BP 154/83 | HR 76 | Temp 96.9°F | Ht 61.0 in | Wt 139.0 lb

## 2018-02-05 DIAGNOSIS — R768 Other specified abnormal immunological findings in serum: Secondary | ICD-10-CM | POA: Diagnosis not present

## 2018-02-05 DIAGNOSIS — K219 Gastro-esophageal reflux disease without esophagitis: Secondary | ICD-10-CM | POA: Diagnosis not present

## 2018-02-05 DIAGNOSIS — M0580 Other rheumatoid arthritis with rheumatoid factor of unspecified site: Secondary | ICD-10-CM | POA: Diagnosis not present

## 2018-02-05 DIAGNOSIS — Z23 Encounter for immunization: Secondary | ICD-10-CM | POA: Diagnosis not present

## 2018-02-05 DIAGNOSIS — M2550 Pain in unspecified joint: Secondary | ICD-10-CM | POA: Diagnosis not present

## 2018-02-05 DIAGNOSIS — M159 Polyosteoarthritis, unspecified: Secondary | ICD-10-CM | POA: Diagnosis not present

## 2018-02-05 MED ORDER — PREDNISONE 10 MG (21) PO TBPK: ORAL_TABLET | ORAL | 0 refills | Status: DC

## 2018-02-05 MED ORDER — DICLOFENAC SODIUM 1 % TD GEL: TRANSDERMAL | 4 refills | Status: DC

## 2018-02-05 MED ORDER — OMEPRAZOLE 20 MG PO CPDR: DELAYED_RELEASE_CAPSULE | ORAL | 1 refills | Status: DC

## 2018-02-05 MED ORDER — DICLOFENAC SODIUM 75 MG PO TBEC: 75.0000 mg | DELAYED_RELEASE_TABLET | Freq: Two times a day (BID) | ORAL | 5 refills | Status: DC

## 2018-02-05 NOTE — Progress Notes (Signed)
Subjective:    Patient ID: [name], female    DOB: [birth year], 84 y.o.   MRN: [id number]  Chief Complaint  Patient presents with  . Medical Management of Chronic Issues   PT has been seen several times in the last few months at our office for arthritis. She was started on diclofenac BID that did help. She had elevated Rheumatoid factor. She states she never saw a Rheumatologists.   Arthritis  Presents for follow-up visit. She complains of pain, stiffness and joint swelling. The symptoms have been stable. Affected locations include the right MCP, left MCP, right wrist and left wrist. Her pain is at a severity of 10/10.  Gastroesophageal Reflux  She complains of heartburn. She reports no belching or no coughing. This is a chronic problem. The current episode started more than 1 year ago. The problem occurs occasionally. She has tried a PPI for the symptoms. The treatment provided moderate relief.     *Intrupter used.  Review of Systems  Respiratory: Negative for cough.   Gastrointestinal: Positive for heartburn.  Musculoskeletal: Positive for arthritis, joint swelling and stiffness.  All other systems reviewed and are negative.      Objective:   Physical Exam  Constitutional: She is oriented to person, place, and time. She appears well-developed and well-nourished. No distress.  HENT:  Head: Normocephalic.  Eyes: Pupils are equal, round, and reactive to light.  Neck: Normal range of motion. Neck supple. No thyromegaly present.  Cardiovascular: Normal rate, regular rhythm, normal heart sounds and intact distal pulses.  No murmur heard. Pulmonary/Chest: Effort normal and breath sounds normal. No respiratory distress. She has no wheezes.  Abdominal: Soft. Bowel sounds are normal. She exhibits no distension. There is no tenderness.  Musculoskeletal: She exhibits edema and tenderness.  Trace swelling in bilateral wrist, warmth and tenderness present. Pain with flexion or  extension.  Neurological: She is alert and oriented to person, place, and time. She has normal reflexes. No cranial nerve deficit.  Skin: Skin is warm and dry.  Psychiatric: She has a normal mood and affect. Her behavior is normal. Judgment and thought content normal.  Vitals reviewed.     BP (!) 154/83   Pulse 76   Temp (!) 96.9 F (36.1 C) (Oral)   Ht 5' 1" (1.549 m)   Wt 139 lb (63 kg)   BMI 26.26 kg/m      Assessment & Plan:  [name] comes in today with chief complaint of Medical Management of Chronic Issues   Diagnosis and orders addressed:  1. Osteoarthritis of multiple joints, unspecified osteoarthritis type -Restart Diclofenac BID with food No other NSAID's - BMP8+EGFR - predniSONE (STERAPRED UNI-PAK 21 TAB) 10 MG (21) TBPK tablet; Use as directed  Dispense: 21 tablet; Refill: 0 - Ambulatory referral to Rheumatology - diclofenac sodium (VOLTAREN) 1 % GEL; APPLY 1 GRAM TO AFFECTED AREA 4 TIMES DAILY AS DIRECTED  Dispense: 100 g; Refill: 4  2. Elevated rheumatoid factor - BMP8+EGFR - predniSONE (STERAPRED UNI-PAK 21 TAB) 10 MG (21) TBPK tablet; Use as directed  Dispense: 21 tablet; Refill: 0 - Ambulatory referral to Rheumatology - diclofenac sodium (VOLTAREN) 1 % GEL; APPLY 1 GRAM TO AFFECTED AREA 4 TIMES DAILY AS DIRECTED  Dispense: 100 g; Refill: 4  3. Gastroesophageal reflux disease, esophagitis presence not specified -Diet discussed- Avoid fried, spicy, citrus foods, caffeine and alcohol -Do not eat 2-3 hours before bedtime -Encouraged small frequent meals - BMP8+EGFR - omeprazole (PRILOSEC) 20 MG capsule;  TAKE 1 CAPSULE (20 MG TOTAL) BY MOUTH DAILY.  Dispense: 90 capsule; Refill: 1  4. Polyarthralgia - BMP8+EGFR - diclofenac (VOLTAREN) 75 MG EC tablet; Take 1 tablet (75 mg total) by mouth 2 (two) times daily.  Dispense: 60 tablet; Refill: 5 - predniSONE (STERAPRED UNI-PAK 21 TAB) 10 MG (21) TBPK tablet; Use as directed  Dispense: 21 tablet; Refill:  0  5. Polyarthritis with positive rheumatoid factor (HCC) - BMP8+EGFR - diclofenac (VOLTAREN) 75 MG EC tablet; Take 1 tablet (75 mg total) by mouth 2 (two) times daily.  Dispense: 60 tablet; Refill: 5 - predniSONE (STERAPRED UNI-PAK 21 TAB) 10 MG (21) TBPK tablet; Use as directed  Dispense: 21 tablet; Refill: 0   Labs pending Health Maintenance reviewed-Flu vaccine given today Diet and exercise encouraged  Follow up plan: With PCP in 6 months    Evelina Dun, FNP

## 2018-02-05 NOTE — Patient Instructions (Addendum)
Artritis (Arthritis) El trmino artritis se [place] comnmente para hacer referencia al dolor de las articulaciones o a la enfermedad articular. Hay ms de 100tipos de artritis. CAUSAS La causa ms frecuente de esta afeccin es el desgaste de una articulacin. Algunas otras causas son las siguientes:  Gota.  Inflamacin de una articulacin.  Una infeccin de Insurance claims handler.  Esguinces y otras lesiones cerca de la articulacin.  Una reaccin farmacolgica o alrgica. En algunos casos, es posible que la causa no se conozca. SNTOMAS El sntoma principal de esta afeccin es el dolor de la articulacin con el movimiento. Otros sntomas pueden ser los siguientes:  Enrojecimiento, hinchazn o rigidez de Insurance claims handler.  Calor que emana de Water engineer.  Cristy Hilts.  Sensacin generalizada de estar enfermo. DIAGNSTICO Esta afeccin se puede diagnosticar mediante un examen fsico y Arvid Right ellos:  Anlisis de [place].  Anlisis de [place].  Estudios de diagnstico por imgenes, como una resonancia magntica (RM), radiografas o una tomografa computarizada (TC). A veces, se extrae lquido de una articulacin para analizarlo. TRATAMIENTO El tratamiento de esta afeccin puede incluir lo siguiente:  El tratamiento de la causa, si se conoce.  Reposo.  Mantener elevada la articulacin.  Aplicar compresas fras o calientes en la articulacin.  Medicamentos para [employer] sntomas y Risk manager.  Inyecciones de un corticoide, como cortisona, en la articulacin para ayudar a Best boy y Risk manager. Segn la causa de la artritis, tal vez haya que hacer cambios en el estilo de vida para reducir la carga sobre la articulacin. Algunos de los cambios incluyen realizar ms actividad fsica y Sports coach de [place], [place] los medicamentos de venta libre y los recetados solamente como se lo  haya indicado el mdico.  No tome aspirina para Theatre stage manager dolor si se sospecha la presencia de [place]. Actividades  Ponga en reposo la articulacin como se lo haya indicado el mdico. El reposo es importante cuando la enfermedad est activa y la articulacin le duele, est hinchada o rgida.  Evite las actividades que intensifiquen Conservation officer, historic buildings. Es importante Paramedic equilibrio entre la actividad y el reposo.  Con frecuencia, realice ejercicios de flexibilidad articular como se lo haya indicado el mdico. Intente realizar ejercicios de bajo impacto, por ejemplo: ? Natacin. ? Gimnasia acutica. ? Andar en bicicleta. ? Caminar. Cuidado de la articulacin  Si la articulacin se le hincha, mantngala elevada como se lo haya indicado el mdico.  Si al despertar por la maana, nota que la articulacin est rgida, intente tomar una ducha con agua tibia.  Si se lo indican, pngase calor en la articulacin. Si es diabtico, no se aplique calor sin la autorizacin del mdico. ? Coloque una toalla entre la articulacin y la compresa caliente o la almohadilla trmica. ? Coloque el calor en la zona durante 20 o 31minutos.  Si se lo indican, pngase hielo en la articulacin: ? Ponga el hielo en una bolsa plstica. ? Coloque una [employer] piel y la bolsa de hielo. ? Coloque el hielo durante 20minutos, 2 a 3veces por da.  Concurra a todas las visitas de control como se lo haya indicado el mdico. Esto es importante. SOLICITE ATENCIN MDICA SI:  El dolor empeora.  Tiene fiebre. SOLICITE ATENCIN MDICA DE INMEDIATO SI:  Siente dolor, u observa hinchazn o enrojecimiento en la articulacin.  Siente dolor en muchas articulaciones y se le hinchan.  Siente un dolor  intenso en la espalda.  Tiene mucha debilidad en la pierna.  No puede controlar los intestinos o la vejiga. Esta informacin no tiene Marine scientist el consejo del mdico. Asegrese de hacerle al mdico cualquier  pregunta que tenga. Document Released: 04/07/2005 Document Revised: 07/30/2015 Document Reviewed: 07/03/2014 Elsevier Interactive Patient Education  [employer].

## 2018-02-06 LAB — BMP8+EGFR
BUN/Creatinine Ratio: 16 (ref 12–28)
BUN: 12 mg/dL (ref 8–27)
CALCIUM: 9.3 mg/dL (ref 8.7–10.3)
CHLORIDE: 101 mmol/L (ref 96–106)
CO2: 23 mmol/L (ref 20–29)
Creatinine, Ser: 0.74 mg/dL (ref 0.57–1.00)
GFR calc non Af Amer: 76 mL/min/{1.73_m2} (ref >59)
GFR, EST AFRICAN AMERICAN: 87 mL/min/{1.73_m2} (ref >59)
Glucose: 96 mg/dL (ref 65–99)
POTASSIUM: 3.9 mmol/L (ref 3.5–5.2)
SODIUM: 140 mmol/L (ref 134–144)

## 2018-02-22 NOTE — Progress Notes (Deleted)
Office Visit Note  Patient: [name]             Date of Birth: November 27, 1932           MRN: [id number]             PCP: Dettinger, Fransisca Kaufmann, MD Referring: Sharion Balloon, FNP Visit Date: 03/03/2018 Occupation: @GUAROCC @  Subjective:  No chief complaint on file.   History of Present Illness: [name] is a 84 y.o. female ***   Activities of Daily Living:  Patient reports morning stiffness for *** {minute/hour:19697}.   Patient {ACTIONS;DENIES/REPORTS:21021675::"Denies"} nocturnal pain.  Difficulty dressing/grooming: {ACTIONS;DENIES/REPORTS:21021675::"Denies"} Difficulty climbing stairs: {ACTIONS;DENIES/REPORTS:21021675::"Denies"} Difficulty getting out of chair: {ACTIONS;DENIES/REPORTS:21021675::"Denies"} Difficulty using hands for taps, buttons, cutlery, and/or writing: {ACTIONS;DENIES/REPORTS:21021675::"Denies"}  No Rheumatology ROS completed.   PMFS History:  Patient Active Problem List   Diagnosis Date Noted  . GERD (gastroesophageal reflux disease) 02/05/2018  . Laceration of tongue   . Submandibular swelling 02/21/2015  . Thyroid nodule 02/21/2015  . Dysarthria 02/19/2015  . HTN (hypertension) 02/19/2015  . History of TIA (transient ischemic attack) 02/19/2015  . Impaired weight bearing     Past Medical History:  Diagnosis Date  . Arthritis of right foot   . Hx: UTI (urinary tract infection)   . Hypertension   . Palpitations   . Unsteady gait     Family History  Problem Relation Age of Onset  . Heart attack Mother 43       Died in [place]  . Allergic rhinitis Neg Hx   . Angioedema Neg Hx   . Asthma Neg Hx   . Eczema Neg Hx   . Immunodeficiency Neg Hx   . Urticaria Neg Hx    Past Surgical History:  Procedure Laterality Date  . CATARACT EXTRACTION W/PHACO Right 01/01/2016   Procedure: CATARACT EXTRACTION PHACO AND INTRAOCULAR LENS PLACEMENT RIGHT EYE;  Surgeon: Rutherford Guys, MD;  Location: [facility];  Service: Ophthalmology;  Laterality: Right;   CDE: 9.69  . CATARACT EXTRACTION W/PHACO Left 01/29/2016   Procedure: CATARACT EXTRACTION PHACO AND INTRAOCULAR LENS PLACEMENT (IOC);  Surgeon: Rutherford Guys, MD;  Location: [facility];  Service: Ophthalmology;  Laterality: Left;  CDE: 9.32  . None     Social History   Social History Narrative   Lives at home with son and husband.     1 cup coffee per day.   Right-handed.    Objective: Vital Signs: There were no vitals taken for this visit.   Physical Exam   Musculoskeletal Exam: ***  CDAI Exam: CDAI Score: Not documented Patient Global Assessment: Not documented; Provider Global Assessment: Not documented Swollen: Not documented; Tender: Not documented Joint Exam   Not documented   There is currently no information documented on the homunculus. Go to the Rheumatology activity and complete the homunculus joint exam.  Investigation: Findings:  11/24/17: ANA negative, CRP 62, Sed rate 21, RF 60.1  Component     Latest Ref Rng & Units 11/24/2017  Anti Nuclear Antibody(ANA)     Negative Negative  CRP     0 - 10 mg/L 62 (H)  Sed Rate     0 - 40 mm/hr 26  RA Latex Turbid.     0.0 - 13.9 IU/mL 60.1 (H)   Imaging: No results found.  Recent Labs: Lab Results  Component Value Date   WBC 7.7 11/24/2017   HGB 12.2 11/24/2017   PLT 310 11/24/2017   NA 140 02/05/2018   K 3.9  02/05/2018   CL 101 02/05/2018   CO2 23 02/05/2018   GLUCOSE 96 02/05/2018   BUN 12 02/05/2018   CREATININE 0.74 02/05/2018   BILITOT 0.3 07/27/2017   ALKPHOS 70 07/27/2017   AST 24 07/27/2017   ALT 16 07/27/2017   PROT 7.3 07/27/2017   ALBUMIN 4.1 07/27/2017   CALCIUM 9.3 02/05/2018   GFRAA 87 02/05/2018    Speciality Comments: No specialty comments available.  Procedures:  No procedures performed Allergies: Lisinopril   Assessment / Plan:     Visit Diagnoses: Polyarthralgia  Rheumatoid factor positive  Essential hypertension  Thyroid nodule  History of TIA (transient ischemic  attack)  History of gastroesophageal reflux (GERD)   Orders: No orders of the defined types were placed in this encounter.  No orders of the defined types were placed in this encounter.   Face-to-face time spent with patient was *** minutes. Greater than 50% of time was spent in counseling and coordination of care.  Follow-Up Instructions: No follow-ups on file.   Ofilia Neas, PA-C  Note - This record has been created using Dragon software.  Chart creation errors have been sought, but may not always  have been located. Such creation errors do not reflect on  the standard of medical care.

## 2018-03-03 ENCOUNTER — Ambulatory Visit: Payer: Medicare Other | Admitting: Rheumatology

## 2018-04-06 DIAGNOSIS — M25571 Pain in right ankle and joints of right foot: Secondary | ICD-10-CM | POA: Diagnosis not present

## 2018-04-06 DIAGNOSIS — M25572 Pain in left ankle and joints of left foot: Secondary | ICD-10-CM | POA: Diagnosis not present

## 2018-04-12 NOTE — Progress Notes (Signed)
Office Visit Note  Patient: [name]             Date of Birth: 05/03/1932           MRN: [id number]             PCP: Dettinger, Fransisca Kaufmann, MD Referring: Sharion Balloon, FNP Visit Date: 04/26/2018 Occupation: @GUAROCC @  Interpreter: Sonia Heidrich Subjective:  Pain and swelling in both hands.   History of Present Illness: [name] is a 84 y.o. female seen in consultation per request of her PCP.  According to patient she has been experiencing pain and swelling in her both hands for the last 1 year.  She states she is left-handed and the left hand has more problems.  She also experiences swelling in her bilateral ankles and feet.  She has difficulty walking.  She states she fell in 1998 and fractured her left foot since then she has been using a walker.  She had another fracture in the left foot 1 year ago.  She denies pain and discomfort in any other joints.  She denies any neck or lower back pain.  Recalls that she took prednisone in October 2019 she took it for short time and discontinued the medication as it did not help her.  Currently taking diclofenac twice daily.  Activities of Daily Living:  Patient reports morning stiffness for 0 minute.   Patient Denies nocturnal pain.  Difficulty dressing/grooming: Reports Difficulty climbing stairs: Reports Difficulty getting out of chair: Reports Difficulty using hands for taps, buttons, cutlery, and/or writing: Reports  Review of Systems  Constitutional: Negative for fatigue, night sweats, weight gain and weight loss.  HENT: Negative for mouth sores, trouble swallowing, trouble swallowing, mouth dryness and nose dryness.   Eyes: Negative for pain, redness, visual disturbance and dryness.  Respiratory: Negative for cough, shortness of breath and difficulty breathing.   Cardiovascular: Negative for chest pain, palpitations, hypertension, irregular heartbeat and swelling in legs/feet.  Gastrointestinal: Positive for heartburn.  Negative for blood in stool, constipation and diarrhea.  Endocrine: Negative for increased urination.  Genitourinary: Negative for vaginal dryness.  Musculoskeletal: Positive for arthralgias, joint pain and joint swelling. Negative for myalgias, muscle weakness, morning stiffness, muscle tenderness and myalgias.  Skin: Negative for color change, rash, hair loss, skin tightness, ulcers and sensitivity to sunlight.  Allergic/Immunologic: Negative for susceptible to infections.  Neurological: Negative for dizziness, memory loss, night sweats and weakness.  Hematological: Negative for swollen glands.  Psychiatric/Behavioral: Negative for depressed mood and sleep disturbance. The patient is not nervous/anxious.     PMFS History:  Patient Active Problem List   Diagnosis Date Noted  . GERD (gastroesophageal reflux disease) 02/05/2018  . Laceration of tongue   . Submandibular swelling 02/21/2015  . Thyroid nodule 02/21/2015  . Dysarthria 02/19/2015  . HTN (hypertension) 02/19/2015  . History of TIA (transient ischemic attack) 02/19/2015  . Impaired weight bearing     Past Medical History:  Diagnosis Date  . Arthritis of right foot   . Hx: UTI (urinary tract infection)   . Hypertension   . Palpitations   . Unsteady gait     Family History  Problem Relation Age of Onset  . Heart attack Mother 25       Died in [place]  . Healthy Daughter   . Healthy Son   . Healthy Son   . Healthy Daughter   . Allergic rhinitis Neg Hx   . Angioedema Neg Hx   .  Asthma Neg Hx   . Eczema Neg Hx   . Immunodeficiency Neg Hx   . Urticaria Neg Hx    Past Surgical History:  Procedure Laterality Date  . CATARACT EXTRACTION W/PHACO Right 01/01/2016   Procedure: CATARACT EXTRACTION PHACO AND INTRAOCULAR LENS PLACEMENT RIGHT EYE;  Surgeon: Rutherford Guys, MD;  Location: [facility];  Service: Ophthalmology;  Laterality: Right;  CDE: 9.69  . CATARACT EXTRACTION W/PHACO Left 01/29/2016   Procedure: CATARACT  EXTRACTION PHACO AND INTRAOCULAR LENS PLACEMENT (IOC);  Surgeon: Rutherford Guys, MD;  Location: [facility];  Service: Ophthalmology;  Laterality: Left;  CDE: 9.32  . None     Social History   Social History Narrative   Lives at home with son and husband.     1 cup coffee per day.   Right-handed.    Objective: Vital Signs: BP (!) 167/76 (BP Location: Right Arm, Patient Position: Sitting, Cuff Size: Normal)   Pulse 63   Resp 15   Ht 5' 1"  (1.549 m)   Wt 142 lb (64.4 kg)   BMI 26.83 kg/m    Physical Exam Vitals signs and nursing note reviewed.  Constitutional:      Appearance: She is well-developed.  HENT:     Head: Normocephalic and atraumatic.  Eyes:     Conjunctiva/sclera: Conjunctivae normal.  Neck:     Musculoskeletal: Normal range of motion.  Cardiovascular:     Rate and Rhythm: Normal rate and regular rhythm.     Heart sounds: Normal heart sounds.  Pulmonary:     Effort: Pulmonary effort is normal.     Breath sounds: Normal breath sounds.  Abdominal:     General: Bowel sounds are normal.     Palpations: Abdomen is soft.  Lymphadenopathy:     Cervical: No cervical adenopathy.  Skin:    General: Skin is warm and dry.     Capillary Refill: Capillary refill takes less than 2 seconds.  Neurological:     Mental Status: She is alert and oriented to person, place, and time.  Psychiatric:        Behavior: Behavior normal.      Musculoskeletal Exam: C-spine thoracic lumbar spine good range of motion.  Shoulder joints and elbow joints with good range of motion with no synovitis.  She has synovitis over her left wrist joints with limited range of motion.  No MCP or PIP swelling was noted.  DIP and PIP thickening was noted.  Hip joints and knee joints are in good range of motion.  No warmth swelling or effusion was noted in the knee joints.  She has swelling and thickening over bilateral ankle joints.  Some MTP changes but no synovitis was noted.  CDAI Exam: CDAI Score: Not  documented Patient Global Assessment: Not documented; Provider Global Assessment: Not documented Swollen: Not documented; Tender: Not documented Joint Exam   Not documented   There is currently no information documented on the homunculus. Go to the Rheumatology activity and complete the homunculus joint exam.  Investigation: No additional findings. November 24, 2017 ANA negative, ESR 26, CRP 62, RF 60.1 Imaging: Xr Ankle 2 Views Left  Result Date: 04/26/2018 Mild tibiotalar joint space narrowing was noted.  Subtalar collapse was noted.  A small calcaneal spur was noted.   Xr Ankle 2 Views Right  Result Date: 04/26/2018 Tibiotalar and subtalar joint space narrowing was noted.  Subtalar collapse was noted.  Intertarsal joint space narrowing was noted.  A posterior calcaneal spur was noted.  Impression: These findings are consistent with inflammatory arthritis most likely rheumatoid arthritis.  Xr Foot 2 Views Left  Result Date: 04/26/2018 No intertarsal joint space narrowing was noted.  First MTP narrowing and subluxation was noted.  Juxta-articular osteopenia was noted.  PIP and DIP narrowing was noted. Impression: These findings are consistent with inflammatory arthritis and osteoarthritis overlap.  Xr Foot 2 Views Right  Result Date: 04/26/2018 Juxta-articular osteopenia was noted.  PIP and DIP narrowing was noted.  Severe first MTP narrowing and subluxation was noted.  Cystic changes were noted in MTP joints.  Intertarsal joint space narrowing was noted. Impression: These findings are consistent with inflammatory arthritis and osteoarthritis overlap.  Xr Hand 2 View Left  Result Date: 04/26/2018 Juxta-articular osteopenia was noted.  Mild narrowing of intercarpal narrowing joints was noted.  No significant MCP narrowing was noted.  PIP and DIP narrowing was noted.  Possible erosion of the ulnar styloid was noted.  Soft tissue swelling in the wrist joint was noted. Impression: These findings  are consistent with inflammatory arthritis most likely rheumatoid arthritis.  Xr Hand 2 View Right  Result Date: 04/26/2018 Juxta-articular osteopenia was noted.  Mild third MCP narrowing was noted.  PIP and DIP narrowing was noted.  No intercarpal radiocarpal joint space narrowing was noted.  No erosive changes were noted. Impression: These findings are consistent with osteoarthritis and inflammatory arthritis overlap.   Recent Labs: Lab Results  Component Value Date   WBC 7.7 11/24/2017   HGB 12.2 11/24/2017   PLT 310 11/24/2017   NA 140 02/05/2018   K 3.9 02/05/2018   CL 101 02/05/2018   CO2 23 02/05/2018   GLUCOSE 96 02/05/2018   BUN 12 02/05/2018   CREATININE 0.74 02/05/2018   BILITOT 0.3 07/27/2017   ALKPHOS 70 07/27/2017   AST 24 07/27/2017   ALT 16 07/27/2017   PROT 7.3 07/27/2017   ALBUMIN 4.1 07/27/2017   CALCIUM 9.3 02/05/2018   GFRAA 87 02/05/2018    Speciality Comments: No specialty comments available.  Procedures:  No procedures performed Allergies: Lisinopril   Assessment / Plan:     Visit Diagnoses: Pain in both hands -patient complains of pain and swelling in her bilateral hands.  She had active synovitis in her left wrist joint on examination today.  She also has positive rheumatoid factor.  Most likely symptoms are consistent with rheumatoid arthritis.  Plan: XR Hand 2 View Right, XR Hand 2 View Left, the x-rays obtained today were consistent with inflammatory arthritis and osteoarthritis overlap.  Uric acid, Cyclic citrul peptide antibody, IgG  Chronic pain of both ankles -she had right ankle joint fracture per patient in the past.  She does have subluxation of her right ankle.  She also has thickening over bilateral ankle joints with tenderness on palpation.  She has tenderness across MTP joints.  No synovitis was noted.  Plan: XR Ankle 2 Views Right, XR Ankle 2 Views Left.  The x-rays showed tibiotalar collapse on both sides.  More severe arthritis in her  right ankle.  These findings are consistent inflammatory arthritis.  Pain in both feet -she has pain in her bilateral feet.  Plan: XR Foot 2 Views Right, XR Foot 2 Views Left.  The x-ray showed just articular osteopenia and severe arthritis consistent with inflammatory arthritis.  Rheumatoid factor positive-she has positive rheumatoid factor.  High risk medication use - Plan: CBC with Differential/Platelet, COMPLETE METABOLIC PANEL WITH GFR, Urinalysis, Routine w reflex microscopic, Hepatitis B core antibody,  IgM, Hepatitis B surface antigen, Hepatitis C antibody, HIV Antibody (routine testing w rflx), QuantiFERON-TB Gold Plus, Serum protein electrophoresis with reflex, IgG, IgA, IgM, Glucose 6 phosphate dehydrogenase  History of TIA (transient ischemic attack)  Thyroid nodule  Essential hypertension-her blood pressure is elevated.  She has been also taking diclofenac.  I have advised her to avoid diclofenac and monitor blood pressure closely.  History of gastroesophageal reflux (GERD) -she complains of increased reflux symptoms with diclofenac.  I would hold off diclofenac for right now.  Orders: Orders Placed This Encounter  Procedures  . XR Hand 2 View Right  . XR Hand 2 View Left  . XR Ankle 2 Views Right  . XR Ankle 2 Views Left  . XR Foot 2 Views Right  . XR Foot 2 Views Left  . CBC with Differential/Platelet  . COMPLETE METABOLIC PANEL WITH GFR  . Urinalysis, Routine w reflex microscopic  . Uric acid  . Cyclic citrul peptide antibody, IgG  . Hepatitis B core antibody, IgM  . Hepatitis B surface antigen  . Hepatitis C antibody  . HIV Antibody (routine testing w rflx)  . QuantiFERON-TB Gold Plus  . Serum protein electrophoresis with reflex  . IgG, IgA, IgM  . Glucose 6 phosphate dehydrogenase   No orders of the defined types were placed in this encounter.   Face-to-face time spent with patient was 60 minutes. Greater than 50% of time was spent in counseling and  coordination of care.  Follow-Up Instructions: Return for Inflammatory arthritis.   Bo Merino, MD  Note - This record has been created using Editor, commissioning.  Chart creation errors have been sought, but may not always  have been located. Such creation errors do not reflect on  the standard of medical care.

## 2018-04-26 ENCOUNTER — Ambulatory Visit (INDEPENDENT_AMBULATORY_CARE_PROVIDER_SITE_OTHER): Payer: Medicare Other

## 2018-04-26 ENCOUNTER — Ambulatory Visit (INDEPENDENT_AMBULATORY_CARE_PROVIDER_SITE_OTHER): Payer: Self-pay

## 2018-04-26 ENCOUNTER — Encounter: Payer: Self-pay | Admitting: Rheumatology

## 2018-04-26 ENCOUNTER — Encounter (INDEPENDENT_AMBULATORY_CARE_PROVIDER_SITE_OTHER): Payer: Self-pay

## 2018-04-26 ENCOUNTER — Ambulatory Visit (INDEPENDENT_AMBULATORY_CARE_PROVIDER_SITE_OTHER): Payer: Medicare Other | Admitting: Rheumatology

## 2018-04-26 VITALS — BP 167/76 | HR 63 | Resp 15 | Ht 61.0 in | Wt 142.0 lb

## 2018-04-26 DIAGNOSIS — Z8719 Personal history of other diseases of the digestive system: Secondary | ICD-10-CM | POA: Diagnosis not present

## 2018-04-26 DIAGNOSIS — R768 Other specified abnormal immunological findings in serum: Secondary | ICD-10-CM

## 2018-04-26 DIAGNOSIS — I10 Essential (primary) hypertension: Secondary | ICD-10-CM | POA: Diagnosis not present

## 2018-04-26 DIAGNOSIS — Z79899 Other long term (current) drug therapy: Secondary | ICD-10-CM | POA: Diagnosis not present

## 2018-04-26 DIAGNOSIS — M79671 Pain in right foot: Secondary | ICD-10-CM

## 2018-04-26 DIAGNOSIS — M79672 Pain in left foot: Secondary | ICD-10-CM

## 2018-04-26 DIAGNOSIS — G8929 Other chronic pain: Secondary | ICD-10-CM | POA: Diagnosis not present

## 2018-04-26 DIAGNOSIS — E041 Nontoxic single thyroid nodule: Secondary | ICD-10-CM | POA: Diagnosis not present

## 2018-04-26 DIAGNOSIS — Z8673 Personal history of transient ischemic attack (TIA), and cerebral infarction without residual deficits: Secondary | ICD-10-CM | POA: Diagnosis not present

## 2018-04-26 DIAGNOSIS — M79642 Pain in left hand: Secondary | ICD-10-CM

## 2018-04-26 DIAGNOSIS — M79641 Pain in right hand: Secondary | ICD-10-CM | POA: Diagnosis not present

## 2018-04-26 DIAGNOSIS — M25571 Pain in right ankle and joints of right foot: Secondary | ICD-10-CM

## 2018-04-26 DIAGNOSIS — M25572 Pain in left ankle and joints of left foot: Secondary | ICD-10-CM | POA: Diagnosis not present

## 2018-04-28 NOTE — Progress Notes (Signed)
We will discuss labs at the follow-up visit.  Labs are consistent with rheumatoid arthritis.

## 2018-04-29 LAB — CBC WITH DIFFERENTIAL/PLATELET
Absolute Monocytes: 601 cells/uL (ref 200–950)
Basophils Absolute: 82 cells/uL (ref 0–200)
Basophils Relative: 0.9 %
Eosinophils Absolute: 382 cells/uL (ref 15–500)
Eosinophils Relative: 4.2 %
HCT: 36.8 % (ref 35.0–45.0)
Hemoglobin: 12.6 g/dL (ref 11.7–15.5)
Lymphs Abs: 2184 cells/uL (ref 850–3900)
MCH: 29.1 pg (ref 27.0–33.0)
MCHC: 34.2 g/dL (ref 32.0–36.0)
MCV: 85 fL (ref 80.0–100.0)
MPV: 10 fL (ref 7.5–12.5)
Monocytes Relative: 6.6 %
NEUTROS PCT: 64.3 %
Neutro Abs: 5851 cells/uL (ref 1500–7800)
Platelets: 350 10*3/uL (ref 140–400)
RBC: 4.33 10*6/uL (ref 3.80–5.10)
RDW: 13.8 % (ref 11.0–15.0)
Total Lymphocyte: 24 %
WBC: 9.1 10*3/uL (ref 3.8–10.8)

## 2018-04-29 LAB — COMPLETE METABOLIC PANEL WITH GFR
AG Ratio: 1.8 (calc) (ref 1.0–2.5)
ALBUMIN MSPROF: 4.2 g/dL (ref 3.6–5.1)
ALT: 14 U/L (ref 6–29)
AST: 21 U/L (ref 10–35)
Alkaline phosphatase (APISO): 84 U/L (ref 33–130)
BUN / CREAT RATIO: 37 (calc) — AB (ref 6–22)
BUN: 28 mg/dL — AB (ref 7–25)
CO2: 24 mmol/L (ref 20–32)
Calcium: 9.4 mg/dL (ref 8.6–10.4)
Chloride: 106 mmol/L (ref 98–110)
Creat: 0.76 mg/dL (ref 0.60–0.88)
GFR, Est African American: 84 mL/min/{1.73_m2} (ref >=60)
GFR, Est Non African American: 73 mL/min/{1.73_m2} (ref >=60)
Globulin: 2.4 g/dL (calc) (ref 1.9–3.7)
Glucose, Bld: 79 mg/dL (ref 65–99)
Potassium: 5 mmol/L (ref 3.5–5.3)
Sodium: 141 mmol/L (ref 135–146)
Total Bilirubin: 0.5 mg/dL (ref 0.2–1.2)
Total Protein: 6.6 g/dL (ref 6.1–8.1)

## 2018-04-29 LAB — URINALYSIS, ROUTINE W REFLEX MICROSCOPIC
Bacteria, UA: NONE SEEN /HPF
Bilirubin Urine: NEGATIVE
GLUCOSE, UA: NEGATIVE
HYALINE CAST: NONE SEEN /LPF
Hgb urine dipstick: NEGATIVE
Ketones, ur: NEGATIVE
Nitrite: NEGATIVE
Protein, ur: NEGATIVE
Specific Gravity, Urine: 1.023 (ref 1.001–1.03)
Squamous Epithelial / HPF: NONE SEEN /HPF (ref <=5)
pH: <=5 (ref 5.0–8.0)

## 2018-04-29 LAB — PROTEIN ELECTROPHORESIS, SERUM, WITH REFLEX
Albumin ELP: 4 g/dL (ref 3.8–4.8)
Alpha 1: 0.3 g/dL (ref 0.2–0.3)
Alpha 2: 0.7 g/dL (ref 0.5–0.9)
Beta 2: 0.5 g/dL (ref 0.2–0.5)
Beta Globulin: 0.4 g/dL (ref 0.4–0.6)
Gamma Globulin: 0.8 g/dL (ref 0.8–1.7)
Total Protein: 6.7 g/dL (ref 6.1–8.1)

## 2018-04-29 LAB — IGG, IGA, IGM
IgG (Immunoglobin G), Serum: 894 mg/dL (ref 600–1540)
IgM, Serum: 70 mg/dL (ref 50–300)
Immunoglobulin A: 537 mg/dL — ABNORMAL HIGH (ref 70–320)

## 2018-04-29 LAB — IFE INTERPRETATION: Immunofix Electr Int: NOT DETECTED

## 2018-04-29 LAB — HEPATITIS C ANTIBODY
Hepatitis C Ab: NONREACTIVE
SIGNAL TO CUT-OFF: 0.02 (ref <1.00)

## 2018-04-29 LAB — URIC ACID: Uric Acid, Serum: 6.3 mg/dL (ref 2.5–7.0)

## 2018-04-29 LAB — QUANTIFERON-TB GOLD PLUS
Mitogen-NIL: 7.8 IU/mL
NIL: 0.07 IU/mL
QuantiFERON-TB Gold Plus: NEGATIVE
TB1-NIL: 0.13 IU/mL
TB2-NIL: 0.16 [IU]/mL

## 2018-04-29 LAB — HEPATITIS B CORE ANTIBODY, IGM: Hep B C IgM: NONREACTIVE

## 2018-04-29 LAB — GLUCOSE 6 PHOSPHATE DEHYDROGENASE: G-6PDH: 18.7 U/g Hgb (ref 7.0–20.5)

## 2018-04-29 LAB — HEPATITIS B SURFACE ANTIGEN: Hepatitis B Surface Ag: NONREACTIVE

## 2018-04-29 LAB — SPECIMEN COMPROMISED

## 2018-04-29 LAB — CYCLIC CITRUL PEPTIDE ANTIBODY, IGG: Cyclic Citrullin Peptide Ab: >250 UNITS — ABNORMAL HIGH

## 2018-04-29 LAB — HIV ANTIBODY (ROUTINE TESTING W REFLEX): HIV 1&2 Ab, 4th Generation: NONREACTIVE

## 2018-05-10 DIAGNOSIS — M05772 Rheumatoid arthritis with rheumatoid factor of left ankle and foot without organ or systems involvement: Secondary | ICD-10-CM

## 2018-05-10 DIAGNOSIS — M05741 Rheumatoid arthritis with rheumatoid factor of right hand without organ or systems involvement: Secondary | ICD-10-CM | POA: Insufficient documentation

## 2018-05-10 DIAGNOSIS — M05771 Rheumatoid arthritis with rheumatoid factor of right ankle and foot without organ or systems involvement: Secondary | ICD-10-CM | POA: Insufficient documentation

## 2018-05-10 DIAGNOSIS — M05742 Rheumatoid arthritis with rheumatoid factor of left hand without organ or systems involvement: Secondary | ICD-10-CM

## 2018-05-10 DIAGNOSIS — M0579 Rheumatoid arthritis with rheumatoid factor of multiple sites without organ or systems involvement: Secondary | ICD-10-CM | POA: Insufficient documentation

## 2018-05-10 NOTE — Progress Notes (Signed)
Office Visit Note  Patient: [name]             Date of Birth: Apr 03, 1933           MRN: [id number]             PCP: Dettinger, Fransisca Kaufmann, MD Referring: Dettinger, Fransisca Kaufmann, MD Visit Date: 05/24/2018 Occupation: @GUAROCC @  Interpreter: Darrall Dears  Subjective:  Pain and swelling in joints..   History of Present Illness: [name] is a 85 y.o. female with seropositive rheumatoid arthritis.  According to patient she has been having pain and swelling in her hands and feet.  She states that her discomfort persist.  She has difficulty walking and doing routine activities.  Activities of Daily Living:  Patient reports morning stiffness for 0 minute.   Patient Reports nocturnal pain.  Difficulty dressing/grooming: Reports Difficulty climbing stairs: Reports Difficulty getting out of chair: Reports Difficulty using hands for taps, buttons, cutlery, and/or writing: Reports  Review of Systems  Constitutional: Positive for fatigue. Negative for night sweats, weight gain and weight loss.  HENT: Positive for mouth dryness. Negative for mouth sores, trouble swallowing, trouble swallowing and nose dryness.   Eyes: Negative for pain, redness, visual disturbance and dryness.  Respiratory: Negative for cough, shortness of breath and difficulty breathing.   Cardiovascular: Negative for chest pain, palpitations, hypertension, irregular heartbeat and swelling in legs/feet.  Gastrointestinal: Positive for heartburn. Negative for blood in stool, constipation and diarrhea.  Endocrine: Negative for increased urination.  Genitourinary: Negative for vaginal dryness.  Musculoskeletal: Positive for arthralgias, joint pain, joint swelling and morning stiffness. Negative for myalgias, muscle weakness, muscle tenderness and myalgias.  Skin: Negative for color change, rash, hair loss, skin tightness, ulcers and sensitivity to sunlight.  Allergic/Immunologic: Negative for susceptible to  infections.  Neurological: Negative for dizziness, memory loss, night sweats and weakness.  Hematological: Negative for swollen glands.  Psychiatric/Behavioral: Positive for sleep disturbance. Negative for depressed mood. The patient is not nervous/anxious.     PMFS History:  Patient Active Problem List   Diagnosis Date Noted  . Rheumatoid arthritis involving multiple sites with positive rheumatoid factor ([place]) 05/10/2018  . Rheumatoid arthritis involving both hands with positive rheumatoid factor ([place]) 05/10/2018  . Rheumatoid arthritis involving both feet with positive rheumatoid factor ([place]) 05/10/2018  . GERD (gastroesophageal reflux disease) 02/05/2018  . Laceration of tongue   . Submandibular swelling 02/21/2015  . Thyroid nodule 02/21/2015  . Dysarthria 02/19/2015  . HTN (hypertension) 02/19/2015  . History of TIA (transient ischemic attack) 02/19/2015  . Impaired weight bearing     Past Medical History:  Diagnosis Date  . Arthritis of right foot   . Hx: UTI (urinary tract infection)   . Hypertension   . Palpitations   . Unsteady gait     Family History  Problem Relation Age of Onset  . Heart attack Mother 52       Died in [place]  . Healthy Daughter   . Healthy Son   . Healthy Son   . Healthy Daughter   . Allergic rhinitis Neg Hx   . Angioedema Neg Hx   . Asthma Neg Hx   . Eczema Neg Hx   . Immunodeficiency Neg Hx   . Urticaria Neg Hx    Past Surgical History:  Procedure Laterality Date  . CATARACT EXTRACTION W/PHACO Right 01/01/2016   Procedure: CATARACT EXTRACTION PHACO AND INTRAOCULAR LENS PLACEMENT RIGHT EYE;  Surgeon: Rutherford Guys, MD;  Location: [facility];  Service: Ophthalmology;  Laterality: Right;  CDE: 9.69  . CATARACT EXTRACTION W/PHACO Left 01/29/2016   Procedure: CATARACT EXTRACTION PHACO AND INTRAOCULAR LENS PLACEMENT (IOC);  Surgeon: Rutherford Guys, MD;  Location: [facility];  Service: Ophthalmology;  Laterality: Left;  CDE: 9.32  . None     Social  History   Social History Narrative   Lives at home with son and husband.     1 cup coffee per day.   Right-handed.   Immunization History  Administered Date(s) Administered  . Influenza, High Dose Seasonal PF 02/05/2018  . Tdap 01/16/2017     Objective: Vital Signs: BP (!) 166/70 (BP Location: Left Arm, Patient Position: Sitting, Cuff Size: Normal)   Pulse 65   Resp 15   Ht 5' 1"  (1.549 m)   Wt 141 lb (64 kg)   BMI 26.64 kg/m    Physical Exam Vitals signs and nursing note reviewed.  Constitutional:      Appearance: She is well-developed.  HENT:     Head: Normocephalic and atraumatic.  Eyes:     Conjunctiva/sclera: Conjunctivae normal.  Neck:     Musculoskeletal: Normal range of motion.  Cardiovascular:     Rate and Rhythm: Normal rate and regular rhythm.     Heart sounds: Normal heart sounds.  Pulmonary:     Effort: Pulmonary effort is normal.     Breath sounds: Normal breath sounds.  Abdominal:     General: Bowel sounds are normal.     Palpations: Abdomen is soft.  Lymphadenopathy:     Cervical: No cervical adenopathy.  Skin:    General: Skin is warm and dry.     Capillary Refill: Capillary refill takes less than 2 seconds.  Neurological:     Mental Status: She is alert and oriented to person, place, and time.  Psychiatric:        Behavior: Behavior normal.      Musculoskeletal Exam: C-spine thoracic lumbar spine good range of motion.  Shoulder joints elbow joints in good range of motion.  She has synovitis since tenderness over her MCPs and wrist joints as described below.  She had good range of motion of her knee joints.  She has swelling over her ankles and tenderness across MTPs as described below.  CDAI Exam: CDAI Score: 7  Patient Global Assessment: 5 (mm); Provider Global Assessment: 5 (mm) Swollen: 2 ; Tender: 6  Joint Exam      Right  Left  Wrist     Swollen Tender  MCP 2   Tender   Tender  MCP 3   Tender   Tender  Ankle     Swollen Tender      Investigation: No additional findings.  Imaging: Xr Ankle 2 Views Left  Result Date: 04/26/2018 Mild tibiotalar joint space narrowing was noted.  Subtalar collapse was noted.  A small calcaneal spur was noted.   Xr Ankle 2 Views Right  Result Date: 04/26/2018 Tibiotalar and subtalar joint space narrowing was noted.  Subtalar collapse was noted.  Intertarsal joint space narrowing was noted.  A posterior calcaneal spur was noted. Impression: These findings are consistent with inflammatory arthritis most likely rheumatoid arthritis.  Xr Foot 2 Views Left  Result Date: 04/26/2018 No intertarsal joint space narrowing was noted.  First MTP narrowing and subluxation was noted.  Juxta-articular osteopenia was noted.  PIP and DIP narrowing was noted. Impression: These findings are consistent with inflammatory arthritis and osteoarthritis overlap.  Xr Foot 2 Views Right  Result Date: 04/26/2018 Juxta-articular osteopenia was noted.  PIP and DIP narrowing was noted.  Severe first MTP narrowing and subluxation was noted.  Cystic changes were noted in MTP joints.  Intertarsal joint space narrowing was noted. Impression: These findings are consistent with inflammatory arthritis and osteoarthritis overlap.  Xr Hand 2 View Left  Result Date: 04/26/2018 Juxta-articular osteopenia was noted.  Mild narrowing of intercarpal narrowing joints was noted.  No significant MCP narrowing was noted.  PIP and DIP narrowing was noted.  Possible erosion of the ulnar styloid was noted.  Soft tissue swelling in the wrist joint was noted. Impression: These findings are consistent with inflammatory arthritis most likely rheumatoid arthritis.  Xr Hand 2 View Right  Result Date: 04/26/2018 Juxta-articular osteopenia was noted.  Mild third MCP narrowing was noted.  PIP and DIP narrowing was noted.  No intercarpal radiocarpal joint space narrowing was noted.  No erosive changes were noted. Impression: These findings are  consistent with osteoarthritis and inflammatory arthritis overlap.   Recent Labs: Lab Results  Component Value Date   WBC 9.1 04/26/2018   HGB 12.6 04/26/2018   PLT 350 04/26/2018   NA 141 04/26/2018   K 5.0 04/26/2018   CL 106 04/26/2018   CO2 24 04/26/2018   GLUCOSE 79 04/26/2018   BUN 28 (H) 04/26/2018   CREATININE 0.76 04/26/2018   BILITOT 0.5 04/26/2018   ALKPHOS 70 07/27/2017   AST 21 04/26/2018   ALT 14 04/26/2018   PROT 6.6 04/26/2018   PROT 6.7 04/26/2018   ALBUMIN 4.1 07/27/2017   CALCIUM 9.4 04/26/2018   GFRAA 84 04/26/2018   QFTBGOLDPLUS NEGATIVE 04/26/2018   UA negative, AFP negative, TB Gold negative, immunoglobulins normal, hepatitis B-, hepatitis C negative, HIV negative, G6PD normal, uric acid 6.3, anti-CCP> 250  Speciality Comments: No specialty comments available.  Procedures:  No procedures performed Allergies: Lisinopril   Assessment / Plan:     Visit Diagnoses: Rheumatoid arthritis involving multiple sites with positive rheumatoid factor (HCC) - Positive RF, positive anti-CCP.  Patient has severe rheumatoid arthritis involving joints.  She could not tolerate NSAIDs.  She is off prednisone taper.  She has problems with mobility and doing routine activities.  I detailed discussion with patient her son in front of interpreter regarding different treatment options and their side effects.  They would prefer the medication with the least side effects and with minimal office visits as it is very difficult for her son to take time off and bring her to the office visits.  I initially discussed methotrexate which she declined.  After discussing indication side effects contraindications of Plaquenil she was in agreement to proceed with that.  Handout was given and consent was taken.  The plan is to start her on Plaquenil 200 mg p.o. twice daily Monday through Friday.  She will need baseline eye examination and then eye examination once a year.  She will need labs in a  month and then every 3 months and eventually every 5 months.  High risk medication use-handout on Plaquenil was given and consent was taken today.  We will call in prescription.  Rheumatoid arthritis involving both hands with positive rheumatoid factor (HCC)-she has significant synovitis of her left wrist joint and tenderness over MCPs.  Rheumatoid arthritis involving both feet with positive rheumatoid factor (HCC) - Severe arthritis involving bilateral MTPs and tibiotalar joint collapse.  He has difficulty walking due to swelling over her ankles and discomfort in her feet.  History of  TIA (transient ischemic attack)  Essential hypertension-her systolic blood pressure is a still elevated.  Have advised her to follow-up with her PCP.  Thyroid nodule  History of gastroesophageal reflux (GERD)   Orders: Orders Placed This Encounter  Procedures  . CBC with Differential/Platelet  . CMP14+EGFR   Meds ordered this encounter  Medications  . hydroxychloroquine (PLAQUENIL) 200 MG tablet    Sig: Take 1 tablet 200 mg BID Monday-Friday    Dispense:  120 tablet    Refill:  0    Face-to-face time spent with patient was 45 minutes. Greater than 50% of time was spent in counseling and coordination of care.  Follow-Up Instructions: Return in about 3 months (around 08/22/2018) for Rheumatoid arthritis.   Bo Merino, MD  Note - This record has been created using Editor, commissioning.  Chart creation errors have been sought, but may not always  have been located. Such creation errors do not reflect on  the standard of medical care.

## 2018-05-24 ENCOUNTER — Ambulatory Visit (INDEPENDENT_AMBULATORY_CARE_PROVIDER_SITE_OTHER): Payer: Medicare Other | Admitting: Rheumatology

## 2018-05-24 ENCOUNTER — Encounter: Payer: Self-pay | Admitting: Rheumatology

## 2018-05-24 VITALS — BP 166/70 | HR 65 | Resp 15 | Ht 61.0 in | Wt 141.0 lb

## 2018-05-24 DIAGNOSIS — I10 Essential (primary) hypertension: Secondary | ICD-10-CM | POA: Diagnosis not present

## 2018-05-24 DIAGNOSIS — M05772 Rheumatoid arthritis with rheumatoid factor of left ankle and foot without organ or systems involvement: Secondary | ICD-10-CM

## 2018-05-24 DIAGNOSIS — Z8673 Personal history of transient ischemic attack (TIA), and cerebral infarction without residual deficits: Secondary | ICD-10-CM | POA: Diagnosis not present

## 2018-05-24 DIAGNOSIS — Z79899 Other long term (current) drug therapy: Secondary | ICD-10-CM

## 2018-05-24 DIAGNOSIS — M05742 Rheumatoid arthritis with rheumatoid factor of left hand without organ or systems involvement: Secondary | ICD-10-CM | POA: Diagnosis not present

## 2018-05-24 DIAGNOSIS — E041 Nontoxic single thyroid nodule: Secondary | ICD-10-CM

## 2018-05-24 DIAGNOSIS — M0579 Rheumatoid arthritis with rheumatoid factor of multiple sites without organ or systems involvement: Secondary | ICD-10-CM | POA: Diagnosis not present

## 2018-05-24 DIAGNOSIS — Z8719 Personal history of other diseases of the digestive system: Secondary | ICD-10-CM

## 2018-05-24 DIAGNOSIS — Z029 Encounter for administrative examinations, unspecified: Secondary | ICD-10-CM

## 2018-05-24 DIAGNOSIS — M05741 Rheumatoid arthritis with rheumatoid factor of right hand without organ or systems involvement: Secondary | ICD-10-CM

## 2018-05-24 DIAGNOSIS — M05771 Rheumatoid arthritis with rheumatoid factor of right ankle and foot without organ or systems involvement: Secondary | ICD-10-CM | POA: Diagnosis not present

## 2018-05-24 MED ORDER — HYDROXYCHLOROQUINE SULFATE 200 MG PO TABS: ORAL_TABLET | ORAL | 0 refills | Status: DC

## 2018-05-24 NOTE — Patient Instructions (Addendum)
Please schedule Plaquenil eye exam within the next month.  Labs are due in 1 month and you may get them at your primary care doctor.  Standing Labs We placed an order today for your standing lab work.    Please come back and get your standing labs in 1 month and then 3 months.  We have open lab Monday through Friday from 8:30-11:30 AM and 1:30-4:00 PM  at the office of Dr. Bo Merino.   You may experience shorter wait times on Monday and Friday afternoons. The office is located at [address] [place], [place], [place] [address] No appointment is necessary.   Labs are drawn by [facility].  You may receive a bill from [facility] for your lab work.  If you wish to have your labs drawn at another location, please call the office 24 hours in advance to send orders.  If you have any questions regarding directions or hours of operation,  please call [phone].   Just as a reminder please drink plenty of water prior to coming for your lab work. Thanks!  Vaccines You are taking a medication(s) that can suppress your immune system.  The following immunizations are recommended: . Flu annually . Pneumonia (Pneumovax 23 and Prevnar 13 spaced at least 1 year apart) . Shingrix  Please check with your PCP to make sure you are up to date.   Hydroxychloroquine tablets Qu es este medicamento? La HIDROXICLOROQUINA se utiliza para tratar la artritis reumatoide y el lupus eritematoso sistmico. A veces tambin se utiliza para tratar la malaria. Este medicamento puede ser utilizado para otros usos; si tiene alguna pregunta consulte con su proveedor de atencin mdica o con su farmacutico. MARCAS COMUNES: Plaquenil, Quineprox Qu le debo informar a mi profesional de la salud antes de tomar este medicamento? Necesitan saber si usted presenta alguno de los [employer] o situaciones: diabetes enfermedad ocular, problemas de la visin deficiencia de G6PD antecedentes de enfermedades de  la sangre antecedentes de ritmo cardiaco irregular si bebe alcohol con frecuencia enfermedad renal enfermedad heptica porfiria psoriasis convulsiones una reaccin alrgica o inusual a la cloroquina, a la hidroxicloroquina, a otros medicamentos, alimentos, colorantes o conservantes si est embarazada o buscando quedar embarazada si est amamantando a un beb Cmo debo utilizar este medicamento? Tome este medicamento por va oral con un vaso de agua. Siga las instrucciones de la etiqueta del [place]. Evite tomar anticidos dentro de las 4 horas de tomar Coca-Cola. Es mejor que haya una separacin de al menos 4 horas entre estos medicamentos. No corte, triture ni H[employer]el Foods. Puede tomarlo con o sin alimentos. Si el Transport planner, tmelo con alimentos. Tome su medicamento a intervalos regulares. No tome su medicamento con una frecuencia mayor a la indicada. Complete todas las dosis de su medicamento como se le haya indicado, aun si se siente mejor. No omita ninguna dosis ni suspenda el uso de su medicamento antes de lo indicado. Hable con su pediatra para informarse acerca del uso de este medicamento en nios. Aunque este medicamento se puede Advertising account executive, existen precauciones que deben tomarse. Sobredosis: Pngase en contacto inmediatamente con un centro toxicolgico o una sala de urgencia si usted cree que haya tomado demasiado medicamento. ATENCIN: [employer] Foods es solo para usted. No comparta este medicamento con nadie. Qu sucede si me olvido de una dosis? Si olvida una dosis, tmela lo antes posible. Si es casi la hora de la prxima dosis, tome slo esa  dosis. No tome dosis adicionales o dobles. Qu puede interactuar con este medicamento? No tome este medicamento con ninguno de los siguientes frmacos: cisaprida dofetilida dronedarona vacunas de virus vivos penicilamina pimozida tioridazina ziprasidona Este medicamento tambin  puede interactuar con los siguientes medicamentos: ampicilina anticidos cimetidina ciclosporina digoxina medicamentos para la diabetes, tales como [place], glipizida, gliburida medicamentos para las convulsiones, tales como [place]is, fenobarbital, Engineer, agricultural metotrexato otros medicamentos que prolongan el intervalo QT (causan un ritmo cardiaco anormal) praziquantel Puede ser que esta lista no menciona todas las posibles interacciones. Informe a su profesional de [employer]	[place] de [employer] productos a base de hierbas, medicamentos de [place] o suplementos nutritivos que est tomando. Si usted fuma, consume bebidas alcohlicas o si utiliza drogas ilegales, indqueselo tambin a su profesional de KB Home	[place]. Algunas sustancias pueden interactuar con su medicamento. A qu debo estar atento al usar Coca-Cola? Informe a su mdico o a su profesional de la salud si sus sntomas no comienzan a mejorar o si empeoran. Evite tomar anticidos dentro de las 4 horas de tomar Coca-Cola. Es mejor que haya una separacin de al menos 4 horas entre estos medicamentos. Si observa algn cambio en la vista, infrmelo de inmediato a su mdico o su profesional de KB Home	[place]. Es posible que se le realicen pruebas de visin y de sangre antes y durante el uso de [place]. Este medicamento puede aumentar su sensibilidad al sol. Evite la [employer] Corporation. Si no la Product manager, utilice ropa protectora y crema de Photographer. No utilice lmparas solares, camas solares ni cabinas solares. Qu efectos secundarios puedo tener al [employer] este medicamento? Efectos secundarios que debe informar a su mdico o a Barrister's clerk de la salud tan pronto como sea posible: Chief of Staff, como erupcin cutnea, comezn/picazn o urticarias, e hinchazn de la cara, los labios o la lengua cambios en la visin disminucin de la audicin o zumbido en los odos enrojecimiento, formacin de ampollas, descamacin o  distensin de la piel, incluso dentro de la boca convulsiones sensibilidad a la luz signos y sntomas de un cambio peligroso en el pulso o ritmo cardiaco, tales como dolor en el pecho; mareos; ritmo cardiaco rpido o irregular; palpitaciones; sensacin de desmayos o aturdimiento, cadas; problemas respiratorios signos y sntomas de lesin al hgado, como orina amarilla oscura o [place]; sensacin general de estar enfermo o sntomas gripales; heces claras; prdida de apetito; nuseas; dolor en la regin abdominal superior derecha; cansancio o debilidad inusual; color amarillento de los ojos o la piel signos y sntomas de niveles bajos de Location manager en la sangre, tales como ansiedad; confusin; Tree surgeon; aumento del apetito; debilidad o cansancio inusual; sudoracin; temblores; fro; irritabilidad; dolor de cabeza; visin borrosa; ritmo cardiaco rpido; prdida del conocimiento movimientos incontrolables de la cabeza, la boca, el cuello, los brazos o las piernas Efectos secundarios que generalmente no requieren atencin mdica (infrmelos a su mdico o a Barrister's clerk de la salud si persisten o si son molestos): ansiedad diarrea mareos cada del cabello dolor de cabeza irritabilidad prdida del apetito nuseas, vmito dolor estomacal Puede ser que esta lista no menciona todos los posibles efectos secundarios. Comunquese a su mdico por asesoramiento mdico Humana Inc. Usted puede informar los efectos secundarios a la FDA por telfono al 1-800-FDA-1088. Dnde debo guardar mi medicina? Mantngala fuera del alcance de los nios. Para los nios, este medicamento puede causar una sobredosis con dosis pequeas. Gurdela a [employer], entre 15 y 3 grados C (59 y  7 grados F). Protjala de la luz y de la humedad. Deseche todo medicamento sin utilizar despus de su fecha de vencimiento. ATENCIN: Este folleto es un resumen. Puede ser que no cubra toda la posible informacin. Si usted tiene  preguntas acerca de esta medicina, consulte con su mdico, su farmacutico o su profesional de Technical sales engineer.  2019 Elsevier/Gold Standard (2016-05-08 00:00:00)

## 2018-05-24 NOTE — Progress Notes (Signed)
Pharmacy Note  Subjective: Patient presents today to the [facility] to see Dr. Estanislado Pandy. She was accompanied by her son who served as her interpreter. Patient seen by the pharmacist for counseling on hydroxychloroquine rheumatoid arthritis. She is naive to therapy.  Methotrexate was discussed but patient is concerned about side effects and prefers to start with Plaquenil.  Objective: CMP     Component Value Date/Time   NA 141 04/26/2018 1005   NA 140 02/05/2018 1209   K 5.0 04/26/2018 1005   CL 106 04/26/2018 1005   CO2 24 04/26/2018 1005   GLUCOSE 79 04/26/2018 1005   BUN 28 (H) 04/26/2018 1005   BUN 12 02/05/2018 1209   CREATININE 0.76 04/26/2018 1005   CALCIUM 9.4 04/26/2018 1005   PROT 6.6 04/26/2018 1005   PROT 6.7 04/26/2018 1005   PROT 7.3 07/27/2017 1205   ALBUMIN 4.1 07/27/2017 1205   AST 21 04/26/2018 1005   ALT 14 04/26/2018 1005   ALKPHOS 70 07/27/2017 1205   BILITOT 0.5 04/26/2018 1005   BILITOT 0.3 07/27/2017 1205   GFRNONAA 73 04/26/2018 1005   GFRAA 84 04/26/2018 1005    CBC    Component Value Date/Time   WBC 9.1 04/26/2018 1005   RBC 4.33 04/26/2018 1005   HGB 12.6 04/26/2018 1005   HGB 12.2 11/24/2017 1412   HCT 36.8 04/26/2018 1005   HCT 37.8 11/24/2017 1412   PLT 350 04/26/2018 1005   PLT 310 11/24/2017 1412   MCV 85.0 04/26/2018 1005   MCV 88 11/24/2017 1412   MCH 29.1 04/26/2018 1005   MCHC 34.2 04/26/2018 1005   RDW 13.8 04/26/2018 1005   RDW 14.7 11/24/2017 1412   LYMPHSABS 2,184 04/26/2018 1005   LYMPHSABS 1.7 11/24/2017 1412   MONOABS 0.0 (L) 02/22/2015 0438   EOSABS 382 04/26/2018 1005   EOSABS 0.2 11/24/2017 1412   BASOSABS 82 04/26/2018 1005   BASOSABS 0.0 11/24/2017 1412    Assessment/Plan: Patient was counseled on the purpose, proper use, and adverse effects of hydroxychloroquine including nausea/diarrhea, skin rash, headaches, and sun sensitivity.  Discussed importance of annual eye exams while on  hydroxychloroquine to monitor to ocular toxicity and discussed importance of frequent laboratory monitoring.  Provided patient with eye exam form for baseline ophthalmologic exam and standing lab instructions.  Provided patient with educational materials on hydroxychloroquine and answered all questions.  Patient consented to hydroxychloroquine.  Will upload consent in the media tab.     Dose will be Plaquenil 200 mg twice daily Monday through Friday.  Prescription sent to [facility] in [place] per patient request.  All questions encouraged and answered.  Instructed patient to call with any further questions or concerns.  Mariella Saa, PharmD, [facility] Clinical Pharmacist  05/24/2018 12:18 PM

## 2018-06-15 DIAGNOSIS — H26491 Other secondary cataract, right eye: Secondary | ICD-10-CM | POA: Diagnosis not present

## 2018-06-15 DIAGNOSIS — H04123 Dry eye syndrome of bilateral lacrimal glands: Secondary | ICD-10-CM | POA: Diagnosis not present

## 2018-06-15 DIAGNOSIS — Z961 Presence of intraocular lens: Secondary | ICD-10-CM | POA: Diagnosis not present

## 2018-06-15 DIAGNOSIS — Z9842 Cataract extraction status, left eye: Secondary | ICD-10-CM | POA: Diagnosis not present

## 2018-06-16 ENCOUNTER — Encounter: Payer: Self-pay | Admitting: Family Medicine

## 2018-06-16 ENCOUNTER — Ambulatory Visit (INDEPENDENT_AMBULATORY_CARE_PROVIDER_SITE_OTHER): Payer: Medicare Other | Admitting: Family Medicine

## 2018-06-16 VITALS — BP 143/74 | HR 66 | Temp 97.7°F | Ht 61.0 in | Wt 139.4 lb

## 2018-06-16 DIAGNOSIS — I10 Essential (primary) hypertension: Secondary | ICD-10-CM

## 2018-06-16 NOTE — Progress Notes (Signed)
BP (!) 143/74   Pulse 66   Temp 97.7 F (36.5 C) (Oral)   Ht 5' 1"  (1.549 m)   Wt 139 lb 6.4 oz (63.2 kg)   BMI 26.34 kg/m    Subjective:    Patient ID: [name], female    DOB: January 21, 1933, 85 y.o.   MRN: [id number]  HPI: [name] is a 85 y.o. female presenting on 06/16/2018 for Hypertension (Patient states her rheumatology told her to follow up with PCP about htn)   HPI Hypertension Patient is currently on no medication for blood pressure, and their blood pressure today is 143/74, at her rheumatologist it is running in the 160s, she has a machine at her daughter's house and will have her check it more frequently and coming back in a month with those numbers. Patient denies headaches, blurred vision, chest pains, shortness of breath, or weakness. Denies any side effects from medication and is content with current medication.  Patient does have the occasional dizziness the last for a few seconds when she is up on her feet and moving around but then she says it passes quickly and does not happen all the time.  She has not checked her blood pressure when this happens.  Relevant past medical, surgical, family and social history reviewed and updated as indicated. Interim medical history since our last visit reviewed. Allergies and medications reviewed and updated.  Review of Systems  Constitutional: Negative for chills and fever.  HENT: Negative for congestion, ear discharge and ear pain.   Eyes: Negative for redness and visual disturbance.  Respiratory: Negative for chest tightness and shortness of breath.   Cardiovascular: Negative for chest pain and leg swelling.  Genitourinary: Negative for dysuria.  Musculoskeletal: Negative for back pain and gait problem.  Skin: Negative for rash.  Neurological: Positive for dizziness and light-headedness. Negative for headaches.  Psychiatric/Behavioral: Negative for agitation and behavioral problems.  All other systems reviewed and  are negative.   Per HPI unless specifically indicated above   Allergies as of 06/16/2018      Reactions   Lisinopril Swelling   Tongue swelling      Medication List       Accurate as of June 16, 2018 11:31 AM. Always use your most recent med list.        hydroxychloroquine 200 MG tablet Commonly known as:  PLAQUENIL Take 1 tablet 200 mg BID Monday-Friday          Objective:    BP (!) 143/74   Pulse 66   Temp 97.7 F (36.5 C) (Oral)   Ht 5' 1"  (1.549 m)   Wt 139 lb 6.4 oz (63.2 kg)   BMI 26.34 kg/m   Wt Readings from Last 3 Encounters:  06/16/18 139 lb 6.4 oz (63.2 kg)  05/24/18 141 lb (64 kg)  04/26/18 142 lb (64.4 kg)    Physical Exam Vitals signs and nursing note reviewed.  Constitutional:      General: She is not in acute distress.    Appearance: She is well-developed. She is not diaphoretic.  Eyes:     Conjunctiva/sclera: Conjunctivae normal.  Cardiovascular:     Rate and Rhythm: Normal rate and regular rhythm.     Heart sounds: Normal heart sounds. No murmur.  Pulmonary:     Effort: Pulmonary effort is normal. No respiratory distress.     Breath sounds: Normal breath sounds. No wheezing.  Musculoskeletal: Normal range of motion.  General: No tenderness.  Skin:    General: Skin is warm and dry.     Findings: No rash.  Neurological:     Mental Status: She is alert and oriented to person, place, and time.     Coordination: Coordination normal.  Psychiatric:        Behavior: Behavior normal.         Assessment & Plan:   Problem List Items Addressed This Visit      Cardiovascular and Mediastinum   HTN (hypertension) - Primary   Relevant Orders   CBC with Differential/Platelet   CMP14+EGFR      No medication for now but will monitor and have her come back in a month and discuss this or call in in a month with results Follow up plan: Return in about 3 months (around 09/14/2018), or if symptoms worsen or fail to improve, for  Hypertension recheck.  Counseling provided for all of the vaccine components Orders Placed This Encounter  Procedures  . CBC with Differential/Platelet  . Copemish Rama Sorci, MD [facility] 06/16/2018, 11:31 AM

## 2018-06-17 LAB — CMP14+EGFR
ALT: 13 IU/L (ref 0–32)
AST: 20 IU/L (ref 0–40)
Albumin/Globulin Ratio: 1.7 (ref 1.2–2.2)
Albumin: 4.3 g/dL (ref 3.6–4.6)
Alkaline Phosphatase: 92 IU/L (ref 39–117)
BUN/Creatinine Ratio: 21 (ref 12–28)
BUN: 15 mg/dL (ref 8–27)
Bilirubin Total: 0.3 mg/dL (ref 0.0–1.2)
CHLORIDE: 107 mmol/L — AB (ref 96–106)
CO2: 23 mmol/L (ref 20–29)
Calcium: 9.5 mg/dL (ref 8.7–10.3)
Creatinine, Ser: 0.73 mg/dL (ref 0.57–1.00)
GFR calc Af Amer: 88 mL/min/{1.73_m2} (ref >59)
GFR calc non Af Amer: 76 mL/min/{1.73_m2} (ref >59)
GLOBULIN, TOTAL: 2.6 g/dL (ref 1.5–4.5)
Glucose: 81 mg/dL (ref 65–99)
Potassium: 4.7 mmol/L (ref 3.5–5.2)
SODIUM: 145 mmol/L — AB (ref 134–144)
Total Protein: 6.9 g/dL (ref 6.0–8.5)

## 2018-06-17 LAB — CBC WITH DIFFERENTIAL/PLATELET
Basophils Absolute: 0.1 10*3/uL (ref 0.0–0.2)
Basos: 1 %
EOS (ABSOLUTE): 0.3 10*3/uL (ref 0.0–0.4)
EOS: 5 %
HEMATOCRIT: 38.2 % (ref 34.0–46.6)
Hemoglobin: 12.9 g/dL (ref 11.1–15.9)
Immature Grans (Abs): 0 10*3/uL (ref 0.0–0.1)
Immature Granulocytes: 0 %
Lymphocytes Absolute: 2 10*3/uL (ref 0.7–3.1)
Lymphs: 28 %
MCH: 28.7 pg (ref 26.6–33.0)
MCHC: 33.8 g/dL (ref 31.5–35.7)
MCV: 85 fL (ref 79–97)
Monocytes Absolute: 0.6 10*3/uL (ref 0.1–0.9)
Monocytes: 8 %
Neutrophils Absolute: 4.1 10*3/uL (ref 1.4–7.0)
Neutrophils: 58 %
Platelets: 286 10*3/uL (ref 150–450)
RBC: 4.49 x10E6/uL (ref 3.77–5.28)
RDW: 13.4 % (ref 11.7–15.4)
WBC: 7.1 10*3/uL (ref 3.4–10.8)

## 2018-08-12 ENCOUNTER — Other Ambulatory Visit: Payer: Self-pay | Admitting: Rheumatology

## 2018-08-12 NOTE — Telephone Encounter (Addendum)
Last Visit: 05/24/18 Next Visit: 09/15/18 Labs: 06/16/18 WNL No baseline PLQ eye exam  Left message for patient to call back regarding PLQ eye exam.  Okay to refill PLQ?

## 2018-08-13 ENCOUNTER — Telehealth: Payer: Self-pay | Admitting: *Deleted

## 2018-08-13 NOTE — Telephone Encounter (Signed)
Noted  

## 2018-08-13 NOTE — Telephone Encounter (Signed)
Patient had PLQ eye exam done in [place] in March, 2020. Patient's son will call office to have eye exam refaxed.

## 2018-08-13 NOTE — Telephone Encounter (Signed)
ok 

## 2018-08-26 ENCOUNTER — Ambulatory Visit: Payer: Self-pay | Admitting: Rheumatology

## 2018-09-15 ENCOUNTER — Ambulatory Visit (INDEPENDENT_AMBULATORY_CARE_PROVIDER_SITE_OTHER): Payer: Medicare Other | Admitting: Family Medicine

## 2018-09-15 ENCOUNTER — Other Ambulatory Visit: Payer: Self-pay

## 2018-09-15 NOTE — Progress Notes (Signed)
Patient had erroneous encounter because we could not get a hold of her via the phone.  After 4 attempts

## 2018-11-28 ENCOUNTER — Other Ambulatory Visit: Payer: Self-pay | Admitting: Family

## 2018-11-28 DIAGNOSIS — K219 Gastro-esophageal reflux disease without esophagitis: Secondary | ICD-10-CM

## 2019-02-09 ENCOUNTER — Other Ambulatory Visit: Payer: Self-pay | Admitting: Family

## 2019-02-09 DIAGNOSIS — K219 Gastro-esophageal reflux disease without esophagitis: Secondary | ICD-10-CM

## 2019-03-02 ENCOUNTER — Other Ambulatory Visit: Payer: Self-pay | Admitting: Family

## 2019-03-02 ENCOUNTER — Other Ambulatory Visit: Payer: Self-pay | Admitting: Family Medicine

## 2019-03-02 DIAGNOSIS — K219 Gastro-esophageal reflux disease without esophagitis: Secondary | ICD-10-CM

## 2019-03-02 DIAGNOSIS — J302 Other seasonal allergic rhinitis: Secondary | ICD-10-CM

## 2019-03-25 ENCOUNTER — Other Ambulatory Visit: Payer: Self-pay | Admitting: Family Medicine

## 2019-03-25 DIAGNOSIS — K219 Gastro-esophageal reflux disease without esophagitis: Secondary | ICD-10-CM

## 2019-03-25 DIAGNOSIS — J302 Other seasonal allergic rhinitis: Secondary | ICD-10-CM

## 2019-03-25 NOTE — Telephone Encounter (Signed)
Left detailed message.   

## 2019-03-25 NOTE — Telephone Encounter (Signed)
Dettinger. NTBS 30 days given 03/02/19

## 2019-05-03 ENCOUNTER — Other Ambulatory Visit: Payer: Self-pay | Admitting: Family Medicine

## 2019-05-03 DIAGNOSIS — K219 Gastro-esophageal reflux disease without esophagitis: Secondary | ICD-10-CM

## 2019-05-03 NOTE — Telephone Encounter (Signed)
Dettinger. NTBS 30 days given 03/02/19

## 2019-07-05 ENCOUNTER — Other Ambulatory Visit: Payer: Self-pay | Admitting: Family Medicine

## 2019-07-05 DIAGNOSIS — J302 Other seasonal allergic rhinitis: Secondary | ICD-10-CM

## 2019-07-05 NOTE — Telephone Encounter (Signed)
Dettinger. NTBS LOV 06/16/18

## 2019-07-06 NOTE — Telephone Encounter (Signed)
Left detailed message.   

## 2019-11-08 DIAGNOSIS — M25572 Pain in left ankle and joints of left foot: Secondary | ICD-10-CM | POA: Diagnosis not present

## 2019-11-08 DIAGNOSIS — M25571 Pain in right ankle and joints of right foot: Secondary | ICD-10-CM | POA: Diagnosis not present

## 2019-12-28 ENCOUNTER — Ambulatory Visit (INDEPENDENT_AMBULATORY_CARE_PROVIDER_SITE_OTHER): Payer: Medicare Other

## 2019-12-28 ENCOUNTER — Encounter: Payer: Self-pay | Admitting: Family Medicine

## 2019-12-28 ENCOUNTER — Ambulatory Visit (INDEPENDENT_AMBULATORY_CARE_PROVIDER_SITE_OTHER): Payer: Medicare Other | Admitting: Family Medicine

## 2019-12-28 ENCOUNTER — Other Ambulatory Visit: Payer: Self-pay

## 2019-12-28 DIAGNOSIS — W19XXXA Unspecified fall, initial encounter: Secondary | ICD-10-CM

## 2019-12-28 DIAGNOSIS — S0003XA Contusion of scalp, initial encounter: Secondary | ICD-10-CM

## 2019-12-28 DIAGNOSIS — M25571 Pain in right ankle and joints of right foot: Secondary | ICD-10-CM | POA: Diagnosis not present

## 2019-12-28 DIAGNOSIS — S99911A Unspecified injury of right ankle, initial encounter: Secondary | ICD-10-CM | POA: Diagnosis not present

## 2019-12-28 DIAGNOSIS — R55 Syncope and collapse: Secondary | ICD-10-CM

## 2019-12-28 MED ORDER — ACETAMINOPHEN-CODEINE #3 300-30 MG PO TABS: 1.0000 | ORAL_TABLET | ORAL | 1 refills | Status: AC | PRN

## 2019-12-28 NOTE — Progress Notes (Signed)
Chief Complaint  Patient presents with  . Fall  . Ankle Pain    Right    HPI  Patient presents today for following yesterday evening and twisting the right ankle. She is not sure how the fall happened she does not remember stumbling but she does think that things went black.  PMH: Smoking status noted ROS: Per HPI  Objective: There were no vitals taken for this visit. Gen: NAD, alert, cooperative with exam Ext: No edema, warm. There is mild tenderness about the malleoli of the right foot. There is no erythema and no opening of the collateral ligaments or drawer sign. There is also tenderness of the midfoot for passive and active range of motion. Neuro: Alert and oriented, No gross deficits Preliminary x-ray reading shows no acute fracture. Assessment and plan:  1. Fall, initial encounter   2. Acute right ankle pain   3. Vasovagal syncope   4. Contusion of scalp, initial encounter     Meds ordered this encounter  Medications  . acetaminophen-codeine (TYLENOL #3) 300-30 MG tablet    Sig: Take 1-2 tablets by mouth every 4 (four) hours as needed for up to 5 days for moderate pain.    Dispense:  30 tablet    Refill:  1    Orders Placed This Encounter  Procedures  . DG Ankle Complete Right    Standing Status:   Future    Number of Occurrences:   1    Standing Expiration Date:   12/27/2020    Order Specific Question:   Reason for Exam (SYMPTOM  OR DIAGNOSIS REQUIRED)    Answer:   Fall, Ankle Pain- Right    Order Specific Question:   Preferred imaging location?    Answer:   Internal  . CBC with Differential/Platelet  . CMP14+EGFR    Order Specific Question:   Has the patient fasted?    Answer:   Yes    Follow up as needed.  Claretta Fraise, MD

## 2019-12-29 LAB — CMP14+EGFR
ALT: 9 IU/L (ref 0–32)
AST: 18 IU/L (ref 0–40)
Albumin/Globulin Ratio: 1.3 (ref 1.2–2.2)
Albumin: 3.9 g/dL (ref 3.6–4.6)
Alkaline Phosphatase: 80 IU/L (ref 48–121)
BUN/Creatinine Ratio: 14 (ref 12–28)
BUN: 14 mg/dL (ref 8–27)
Bilirubin Total: 0.4 mg/dL (ref 0.0–1.2)
CO2: 22 mmol/L (ref 20–29)
Calcium: 8.4 mg/dL — ABNORMAL LOW (ref 8.7–10.3)
Chloride: 99 mmol/L (ref 96–106)
Creatinine, Ser: 1.01 mg/dL — ABNORMAL HIGH (ref 0.57–1.00)
GFR calc Af Amer: 59 mL/min/{1.73_m2} — ABNORMAL LOW (ref >59)
GFR calc non Af Amer: 51 mL/min/{1.73_m2} — ABNORMAL LOW (ref >59)
Globulin, Total: 2.9 g/dL (ref 1.5–4.5)
Glucose: 107 mg/dL — ABNORMAL HIGH (ref 65–99)
Potassium: 4.3 mmol/L (ref 3.5–5.2)
Sodium: 136 mmol/L (ref 134–144)
Total Protein: 6.8 g/dL (ref 6.0–8.5)

## 2019-12-29 LAB — CBC WITH DIFFERENTIAL/PLATELET
Basophils Absolute: 0 10*3/uL (ref 0.0–0.2)
Basos: 0 %
EOS (ABSOLUTE): 0 10*3/uL (ref 0.0–0.4)
Eos: 0 %
Hematocrit: 36.2 % (ref 34.0–46.6)
Hemoglobin: 12.2 g/dL (ref 11.1–15.9)
Immature Grans (Abs): 0 10*3/uL (ref 0.0–0.1)
Immature Granulocytes: 1 %
Lymphocytes Absolute: 0.7 10*3/uL (ref 0.7–3.1)
Lymphs: 9 %
MCH: 28.6 pg (ref 26.6–33.0)
MCHC: 33.7 g/dL (ref 31.5–35.7)
MCV: 85 fL (ref 79–97)
Monocytes Absolute: 0.7 10*3/uL (ref 0.1–0.9)
Monocytes: 9 %
Neutrophils Absolute: 7 10*3/uL (ref 1.4–7.0)
Neutrophils: 81 %
Platelets: 245 10*3/uL (ref 150–450)
RBC: 4.27 x10E6/uL (ref 3.77–5.28)
RDW: 14.5 % (ref 11.7–15.4)
WBC: 8.5 10*3/uL (ref 3.4–10.8)

## 2019-12-29 NOTE — Progress Notes (Signed)
Hello Rebie,  Your lab result is normal and/or stable.Some minor variations that are not significant are commonly marked abnormal, but do not represent any medical problem for you.  Best regards, Claretta Fraise, M.D.

## 2020-01-03 ENCOUNTER — Encounter: Payer: Self-pay | Admitting: Family Medicine

## 2020-01-23 ENCOUNTER — Ambulatory Visit (INDEPENDENT_AMBULATORY_CARE_PROVIDER_SITE_OTHER): Payer: Medicare Other

## 2020-01-23 ENCOUNTER — Encounter: Payer: Self-pay | Admitting: Family Medicine

## 2020-01-23 ENCOUNTER — Ambulatory Visit (INDEPENDENT_AMBULATORY_CARE_PROVIDER_SITE_OTHER): Payer: Medicare Other | Admitting: Family Medicine

## 2020-01-23 ENCOUNTER — Other Ambulatory Visit: Payer: Self-pay

## 2020-01-23 VITALS — BP 127/74 | HR 54 | Temp 97.0°F | Ht 61.0 in | Wt 132.0 lb

## 2020-01-23 DIAGNOSIS — K219 Gastro-esophageal reflux disease without esophagitis: Secondary | ICD-10-CM

## 2020-01-23 DIAGNOSIS — Z780 Asymptomatic menopausal state: Secondary | ICD-10-CM | POA: Diagnosis not present

## 2020-01-23 DIAGNOSIS — I10 Essential (primary) hypertension: Secondary | ICD-10-CM

## 2020-01-23 NOTE — Progress Notes (Signed)
BP 127/74    Pulse (!) 54    Temp (!) 97 F (36.1 C)    Ht 5\' 1"  (1.549 m)    Wt 132 lb (59.9 kg)    SpO2 98%    BMI 24.94 kg/m    Subjective:   Patient ID: [name], female    DOB: [birth year], 86 y.o.   MRN: [id number]  HPI: [name] is a 86 y.o. female presenting on 01/23/2020 for Medical Management of Chronic Issues and Hypertension   HPI GERD Patient is currently on no medication and says she has been off of it for 2 years and doing well..  She denies any major symptoms or abdominal pain or belching or burping. She denies any blood in her stool or lightheadedness or dizziness.   Hypertension Patient is currently on no medication has been diet controlled, has a history of a TIA, and their blood pressure today is 127/74. Patient denies any lightheadedness or dizziness. Patient denies headaches, blurred vision, chest pains, shortness of breath, or weakness. Denies any side effects from medication and is content with current medication.   Patient is postmenopausal need a bone density scan, she never had 1.  Relevant past medical, surgical, family and social history reviewed and updated as indicated. Interim medical history since our last visit reviewed. Allergies and medications reviewed and updated.  Review of Systems  Constitutional: Negative for chills and fever.  Eyes: Negative for redness and visual disturbance.  Respiratory: Negative for chest tightness and shortness of breath.   Cardiovascular: Negative for chest pain and leg swelling.  Musculoskeletal: Negative for back pain and gait problem.  Skin: Negative for rash.  Neurological: Negative for light-headedness and headaches.  Psychiatric/Behavioral: Negative for agitation and behavioral problems.  All other systems reviewed and are negative.   Per HPI unless specifically indicated above   Allergies as of 01/23/2020      Reactions   Lisinopril Swelling   Tongue swelling      Medication List        Accurate as of January 23, 2020  2:19 PM. If you have any questions, ask your nurse or doctor.        STOP taking these medications   hydroxychloroquine 200 MG tablet Commonly known as: PLAQUENIL Stopped by: Fransisca Kaufmann Shamera Yarberry, MD   omeprazole 20 MG capsule Commonly known as: PRILOSEC Stopped by: Fransisca Kaufmann Fountain Derusha, MD     TAKE these medications   loratadine 10 MG tablet Commonly known as: CLARITIN Take 1 tablet (10 mg total) by mouth daily. Needs to be seen        Objective:   BP 127/74    Pulse (!) 54    Temp (!) 97 F (36.1 C)    Ht 5\' 1"  (1.549 m)    Wt 132 lb (59.9 kg)    SpO2 98%    BMI 24.94 kg/m   Wt Readings from Last 3 Encounters:  01/23/20 132 lb (59.9 kg)  06/16/18 139 lb 6.4 oz (63.2 kg)  05/24/18 141 lb (64 kg)    Physical Exam Vitals and nursing note reviewed.  Constitutional:      General: She is not in acute distress.    Appearance: She is well-developed. She is not diaphoretic.  Eyes:     Conjunctiva/sclera: Conjunctivae normal.     Pupils: Pupils are equal, round, and reactive to light.  Cardiovascular:     Rate and Rhythm: Normal rate and regular rhythm.  Heart sounds: Normal heart sounds. No murmur heard.   Pulmonary:     Effort: Pulmonary effort is normal. No respiratory distress.     Breath sounds: Normal breath sounds. No wheezing.  Musculoskeletal:        General: Tenderness (Patient still has some ankle soreness from a sprain that she sustained, she has been keeping it very stiff on right leg and mostly in bed has not been walking as much and is continue to stay stiff and slightly painful with motion.) present. Normal range of motion.  Skin:    General: Skin is warm and dry.     Findings: No rash.  Neurological:     Mental Status: She is alert and oriented to person, place, and time.     Coordination: Coordination normal.  Psychiatric:        Behavior: Behavior normal.       Assessment & Plan:   Problem List Items Addressed  This Visit      Cardiovascular and Mediastinum   HTN (hypertension) - Primary     Digestive   GERD (gastroesophageal reflux disease)    Other Visit Diagnoses    Postmenopausal       Relevant Orders   DG [facility] DEXA      Patient needs bone density scan and will get today. Follow up plan: Return in about 6 months (around 07/23/2020), or if symptoms worsen or fail to improve, for Hypertension and GERD.  Counseling provided for all of the vaccine components Orders Placed This Encounter  Procedures   DG [place]ye Karenann Mcgrory, MD [facility] 01/23/2020, 2:19 PM

## 2020-01-31 ENCOUNTER — Telehealth: Payer: Self-pay

## 2020-01-31 DIAGNOSIS — J302 Other seasonal allergic rhinitis: Secondary | ICD-10-CM

## 2020-01-31 MED ORDER — LORATADINE 10 MG PO TABS: 10.0000 mg | ORAL_TABLET | Freq: Every day | ORAL | 3 refills | Status: DC

## 2020-01-31 NOTE — Telephone Encounter (Signed)
Was this for claritin or something else?

## 2020-01-31 NOTE — Telephone Encounter (Signed)
Sent a refill for Claritin

## 2020-02-08 ENCOUNTER — Ambulatory Visit: Payer: Medicare Other | Admitting: Pharmacist

## 2020-02-10 ENCOUNTER — Other Ambulatory Visit: Payer: Self-pay

## 2020-02-10 ENCOUNTER — Ambulatory Visit (INDEPENDENT_AMBULATORY_CARE_PROVIDER_SITE_OTHER): Payer: Medicare Other | Admitting: Pharmacist

## 2020-02-10 DIAGNOSIS — M810 Age-related osteoporosis without current pathological fracture: Secondary | ICD-10-CM

## 2020-02-10 MED ORDER — ALENDRONATE SODIUM 70 MG PO TABS: 70.0000 mg | ORAL_TABLET | ORAL | 11 refills | Status: DC

## 2020-02-10 NOTE — Progress Notes (Signed)
    02/10/2020 Name: [name] MRN: [id number] DOB: [birth year]   S:  86 yoF spanish speaking presents to clinic today using walker to assist her.  Her daughter is present for this visit  Current Height:   5'1"     Max Lifetime Height:  5'3" Current Weight:    132 lbs     Ethnicity:Hispanic   HPI: Does pt already have a diagnosis of:  Osteopenia?  No Osteoporosis?  No  Back Pain?  No       Kyphosis?  No Prior fracture?  No-recent fall, but reports ankle was not fractured or broken (also verified per report in Epic) Med(s) for Osteoporosis/Osteopenia:  Calcium/vit d Med(s) previously tried for Osteoporosis/Osteopenia:  n/a                                                             PMH: Age at menopause:  Age 35 Hysterectomy?  No Oophorectomy?  No HRT? No Steroid Use?  No Thyroid med?  No History of cancer?  No History of digestive disorders (ie Crohn's)?  No Current or previous eating disorders?  No Last Vitamin D Result:  N/a, ordered Last GFR Result:  51   FH/SH: Family history of osteoporosis?  No Parent with history of hip fracture?  No Family history of breast cancer?  No Exercise?  No Smoking?  No Alcohol?  No    Calcium Assessment Calcium Intake  # of servings/day  Calcium mg  Milk (8 oz) 1  x  300  = 300  Yogurt (4 oz) 0 x  200 = 0  Cheese (1 oz) 1 x  200 = 200  Other Calcium sources   250mg   Ca supplement 0.5 = 300   Estimated calcium intake per day 1050   FRAX 10 year estimate: FRACTURE RISK: INCREASED  FRAX: World Health Organization FRAX assessment of absolute fracture risk is not calculated for this patient because the patient has osteoporosis.  DEXA 2021 result (no previous dexa to compare)  AP LUMBAR SPINE L1 through L3  Bone Mineral Density (BMD):  0.738 g/cm2  Young Adult T-Score:  -3.6   LEFT FEMUR TOTAL  Bone Mineral Density (BMD):  0.611 g/cm2  Young Adult T-Score: -3.1  Assessment: Patient's diagnostic category is  OSTEOPOROSIS by WHO Criteria.  Recommendations: 1.  Start alendronate (FOSAMAX) 70mg  by mouth once weekly.  GFR 51 2.  recommend calcium 1200mg  daily through supplementation or diet. Continue vitamin D 3.  Patient unable to complete weight baring exercises based on current conditions 4.  Counseled and educated about fall risk and prevention.  Patient uses walker to assist and has been careful. 5.  Patient declined lab work today; vitamin D level ordered for future  Recheck DEXA:  2 years  Time spent counseling patient:  25 minutes    Regina Eck, PharmD, BCPS Clinical Pharmacist, [place]  [facility] Phone [phone]

## 2020-07-17 ENCOUNTER — Other Ambulatory Visit: Payer: Self-pay

## 2020-07-17 ENCOUNTER — Ambulatory Visit (INDEPENDENT_AMBULATORY_CARE_PROVIDER_SITE_OTHER): Payer: Medicare Other | Admitting: Nurse Practitioner

## 2020-07-17 ENCOUNTER — Encounter: Payer: Self-pay | Admitting: Nurse Practitioner

## 2020-07-17 VITALS — BP 137/71 | HR 71 | Temp 97.4°F | Ht 60.0 in | Wt 134.0 lb

## 2020-07-17 DIAGNOSIS — B309 Viral conjunctivitis, unspecified: Secondary | ICD-10-CM | POA: Diagnosis not present

## 2020-07-17 MED ORDER — BACITRACIN-POLYMYXIN B 500-10000 UNIT/GM OP OINT: 1.0000 "application " | TOPICAL_OINTMENT | Freq: Two times a day (BID) | OPHTHALMIC | 1 refills | Status: DC

## 2020-07-17 NOTE — Assessment & Plan Note (Signed)
Bacitracin ointment to both eyes every 12 hours follow-up with worsening unresolved symptoms.  Rx sent to pharmacy.

## 2020-07-17 NOTE — Patient Instructions (Signed)
Viral Conjunctivitis, Adult  Viral conjunctivitis is an inflammation of the clear membrane that covers the white part of the eye and the inner surface of the eyelid (conjunctiva). The inflammation is caused by a viral infection. The blood vessels in the conjunctiva become enlarged, causing the eye to become red or pink and often itchy. It usually starts in one eye and goes to the other in a day or two. Infections often resolve over 1-2 weeks. Viral conjunctivitis is contagious. This means it can be easily passed from one person to another. This condition is often called pink eye. What are the causes? This condition is caused by a virus. It can be spread by touching objects that have been contaminated with the virus, such as doorknobs or towels, and then touching your eye. It can also be passed through tiny droplets, such as from coughing or sneezing. What increases the risk? You are more likely to develop this condition if you have a cold or the flu, or are in close contact with a person with pink eye. What are the signs or symptoms? Symptoms of this condition include:  Redness in the eye.  Tearing or watery eyes.  Itchy and irritated eyes.  Burning feeling in the eyes.  Clear drainage from the eye.  Swollen eyelids.  A gritty feeling in the eye.  Light sensitivity. This condition often occurs with other symptoms, such as nasal congestion, cough, and fever. How is this diagnosed? This condition is diagnosed with a medical history and physical exam. If you have discharge from your eye, the discharge may be tested to rule out other causes of conjunctivitis. How is this treated? Viral conjunctivitis does not respond to medicines that kill bacteria (antibiotics). The condition most often resolves on its own in 1-2 weeks. If treatment is needed, it is aimed at relieving your symptoms and preventing the spread of infection. This may be done with artificial tear drops, antihistamine drops, or  other eye medicines. In rare cases, steroid eye drops or anti-herpes virus medicines may be prescribed. Follow these instructions at home: Medicines  Take or apply over-the-counter and prescription medicines only as told by your health care provider.  Do not touch the edge of the eyelid with the eye-drop bottle or ointment tube when applying medicines to the affected eye. This will prevent the spread of the infection to the other eye or to other people.   Eye care  Avoid touching or rubbing your eyes.  Apply a clean, cool, wet washcloth onto your eye for 10-20 minutes, 3-4 times per day, or as told by your health care provider.  If you wear contact lenses, do not wear them until the inflammation is gone and your health care provider says it is safe to wear them again. Ask your health care provider how to disinfect or replace your contact lenses before using them again. Wear glasses until you can resume wearing contacts.  Avoid wearing eye makeup until the inflammation is gone. Throw away any old eye cosmetics that may be contaminated.  Gently wipe away any crusting from your eye with a wet washcloth or a cotton ball. General instructions  Change or wash your pillowcase every day or as told by your health care provider.  Do not share towels, pillowcases, washcloths, eye makeup, makeup brushes, contact lenses, or eyeglasses. This may spread the infection.  Wash your hands often with soap and water. Use paper towels to dry your hands. If soap and water are not available, use  hand sanitizer.  Avoid contact with other people until your eye is no longer red and tearing, or as told by your health care provider. Contact a health care provider if:  Your symptoms do not improve with treatment, or they get worse.  You have increased pain.  Your vision becomes blurry.  You have a fever.  You have facial pain, redness, or swelling.  You have yellow or green drainage coming from your  eye.  You have new symptoms. Get help right away if:  You develop severe pain.  Your vision gets much worse. Summary  Viral conjunctivitis is an inflammation of the clear membrane that covers the white part of the eye and the inner surface of the eyelid. It usually goes away in 1-2 weeks.  This condition is usually treated with medicines and cold compresses. Treatment focuses on relieving the symptoms.  This condition is very contagious. To prevent infection, avoid close contact with others, wash your hands often, and do not share towels or washcloths.  Contact a health care provider if your symptoms do not go away with treatment, or if you have more pain, poor vision, or swelling in the eyes.  Get help right away if you have severe pain or your vision gets much worse. This information is not intended to replace advice given to you by your health care provider. Make sure you discuss any questions you have with your health care provider. Document Revised: 03/07/2019 Document Reviewed: 02/18/2019 Elsevier Patient Education  2021 [employer].

## 2020-07-17 NOTE — Progress Notes (Signed)
Acute Office Visit  Subjective:    Patient ID: [name], female    DOB: 1933-03-03, 87 y.o.   MRN: [id number]  Chief Complaint  Patient presents with  . Eye Problem    Conjunctivitis  The current episode started more than 1 week ago. The onset was gradual. The problem has been unchanged. The problem is severe. Nothing relieves the symptoms. Associated symptoms include eye itching, eye discharge, eye pain and eye redness. Pertinent negatives include no fever, no decreased vision, no double vision, no photophobia, no congestion, no ear discharge, no ear pain, no headaches, no hearing loss, no sore throat, no swollen glands and no URI. The eye pain is severe. The eyelid exhibits redness and swelling. There were no sick contacts.     Past Medical History:  Diagnosis Date  . Arthritis of right foot   . Hx: UTI (urinary tract infection)   . Hypertension   . Palpitations   . Unsteady gait     Past Surgical History:  Procedure Laterality Date  . CATARACT EXTRACTION W/PHACO Right 01/01/2016   Procedure: CATARACT EXTRACTION PHACO AND INTRAOCULAR LENS PLACEMENT RIGHT EYE;  Surgeon: Rutherford Guys, MD;  Location: [facility];  Service: Ophthalmology;  Laterality: Right;  CDE: 9.69  . CATARACT EXTRACTION W/PHACO Left 01/29/2016   Procedure: CATARACT EXTRACTION PHACO AND INTRAOCULAR LENS PLACEMENT (IOC);  Surgeon: Rutherford Guys, MD;  Location: [facility];  Service: Ophthalmology;  Laterality: Left;  CDE: 9.32  . None      Family History  Problem Relation Age of Onset  . Heart attack Mother 32       Died in [place]  . Healthy Daughter   . Healthy Son   . Healthy Son   . Healthy Daughter   . Allergic rhinitis Neg Hx   . Angioedema Neg Hx   . Asthma Neg Hx   . Eczema Neg Hx   . Immunodeficiency Neg Hx   . Urticaria Neg Hx     Social History   Socioeconomic History  . Marital status: Married    Spouse name: Not on file  . Number of children: 4  . Years of education: 4th grade  .  Highest education level: Not on file  Occupational History  . Occupation: Homemaker  Tobacco Use  . Smoking status: Never Smoker  . Smokeless tobacco: Never Used  Vaping Use  . Vaping Use: Never used  Substance and Sexual Activity  . Alcohol use: No    Alcohol/week: 0.0 standard drinks  . Drug use: Never  . Sexual activity: Not on file  Other Topics Concern  . Not on file  Social History Narrative   Lives at home with son and husband.     1 cup coffee per day.   Right-handed.   Social Determinants of Health   Financial Resource Strain: Not on file  Food Insecurity: Not on file  Transportation Needs: Not on file  Physical Activity: Not on file  Stress: Not on file  Social Connections: Not on file  Intimate Partner Violence: Not on file    Outpatient Medications Prior to Visit  Medication Sig Dispense Refill  . alendronate (FOSAMAX) 70 MG tablet Take 1 tablet (70 mg total) by mouth every 7 (seven) days. Take with a full glass of water on an empty stomach. 4 tablet 11  . loratadine (CLARITIN) 10 MG tablet Take 1 tablet (10 mg total) by mouth daily. Needs to be seen (Patient not taking: Reported on 07/17/2020) 90  tablet 3   No facility-administered medications prior to visit.    Allergies  Allergen Reactions  . Lisinopril Swelling    Tongue swelling    Review of Systems  Constitutional: Negative for fever.  HENT: Negative for congestion, ear discharge, ear pain, hearing loss and sore throat.   Eyes: Positive for pain, discharge, redness and itching. Negative for double vision and photophobia.  Respiratory: Negative.   Cardiovascular: Negative.   Genitourinary: Negative.   Neurological: Negative for headaches.  All other systems reviewed and are negative.      Objective:    Physical Exam Vitals reviewed.  Constitutional:      Appearance: Normal appearance.  HENT:     Head: Normocephalic.     Nose: Nose normal.  Eyes:     General:        Right eye:  Discharge present.        Left eye: Discharge present.    Comments: Conjunctivae erythematous  Cardiovascular:     Rate and Rhythm: Normal rate.  Pulmonary:     Effort: Pulmonary effort is normal.     Breath sounds: Normal breath sounds.  Abdominal:     General: Bowel sounds are normal.  Musculoskeletal:     Cervical back: No rigidity or tenderness.  Skin:    General: Skin is warm.  Neurological:     Mental Status: She is alert and oriented to person, place, and time.  Psychiatric:        Behavior: Behavior normal.     BP 137/71   Pulse 71   Temp (!) 97.4 F (36.3 C) (Temporal)   Ht 5' (1.524 m)   Wt 134 lb (60.8 kg)   SpO2 99%   BMI 26.17 kg/m  Wt Readings from Last 3 Encounters:  07/17/20 134 lb (60.8 kg)  01/23/20 132 lb (59.9 kg)  06/16/18 139 lb 6.4 oz (63.2 kg)    Health Maintenance Due  Topic Date Due  . PNA vac Low Risk Adult (1 of 2 - PCV13) Never done  . COVID-19 Vaccine (3 - Booster for Moderna series) 01/24/2020    There are no preventive care reminders to display for this patient.   Lab Results  Component Value Date   TSH 3.550 12/27/2014   Lab Results  Component Value Date   WBC 8.5 12/28/2019   HGB 12.2 12/28/2019   HCT 36.2 12/28/2019   MCV 85 12/28/2019   PLT 245 12/28/2019   Lab Results  Component Value Date   NA 136 12/28/2019   K 4.3 12/28/2019   CO2 22 12/28/2019   GLUCOSE 107 (H) 12/28/2019   BUN 14 12/28/2019   CREATININE 1.01 (H) 12/28/2019   BILITOT 0.4 12/28/2019   ALKPHOS 80 12/28/2019   AST 18 12/28/2019   ALT 9 12/28/2019   PROT 6.8 12/28/2019   ALBUMIN 3.9 12/28/2019   CALCIUM 8.4 (L) 12/28/2019   ANIONGAP 4 (L) 12/28/2015   Lab Results  Component Value Date   CHOL 271 (H) 07/27/2017   Lab Results  Component Value Date   HDL 39 (L) 07/27/2017   Lab Results  Component Value Date   LDLCALC 196 (H) 07/27/2017   Lab Results  Component Value Date   TRIG 182 (H) 07/27/2017   Lab Results  Component Value  Date   CHOLHDL 6.9 (H) 07/27/2017   No results found for: HGBA1C     Assessment & Plan:   Problem List Items Addressed This Visit  Other   Acute viral conjunctivitis of both eyes - Primary    Bacitracin ointment to both eyes every 12 hours follow-up with worsening unresolved symptoms.  Rx sent to pharmacy.      Relevant Medications   bacitracin-polymyxin b (POLYSPORIN) ophthalmic ointment       Meds ordered this encounter  Medications  . DISCONTD: bacitracin-polymyxin b (POLYSPORIN) ophthalmic ointment    Sig: Place 1 application into both eyes 2 (two) times daily. apply to eye every 12 hours while awake    Dispense:  3.5 g    Refill:  1    Order Specific Question:   Supervising Provider    Answer:   Janora Norlander [[id number]]  . bacitracin-polymyxin b (POLYSPORIN) ophthalmic ointment    Sig: Place 1 application into both eyes 2 (two) times daily. apply to eye every 12 hours while awake    Dispense:  3.5 g    Refill:  [place], NP

## 2020-07-18 ENCOUNTER — Telehealth: Payer: Self-pay

## 2020-07-18 NOTE — Telephone Encounter (Signed)
Pt did receive the ATB eye ointment. The daughter states that she is also supposed to be on an oral med as well?  Confirmed 4/6 appt with [facility] as well.

## 2020-07-19 NOTE — Telephone Encounter (Signed)
Je, will you take a look at this when you return tomorrow please.

## 2020-07-19 NOTE — Telephone Encounter (Signed)
I cannot see anything in the note that would indicate what oral med that was supposed to be, may have to wait until Ardeen Fillers comes back tomorrow and ask her.

## 2020-07-20 ENCOUNTER — Other Ambulatory Visit: Payer: Self-pay | Admitting: Nurse Practitioner

## 2020-07-20 DIAGNOSIS — B309 Viral conjunctivitis, unspecified: Secondary | ICD-10-CM

## 2020-07-20 MED ORDER — DOXYCYCLINE HYCLATE 100 MG PO TABS: 100.0000 mg | ORAL_TABLET | Freq: Two times a day (BID) | ORAL | 0 refills | Status: DC

## 2020-07-20 NOTE — Telephone Encounter (Signed)
I just spoke to patients son.

## 2020-07-25 ENCOUNTER — Other Ambulatory Visit: Payer: Self-pay

## 2020-07-25 ENCOUNTER — Ambulatory Visit (INDEPENDENT_AMBULATORY_CARE_PROVIDER_SITE_OTHER): Payer: Medicare Other | Admitting: Family Medicine

## 2020-07-25 ENCOUNTER — Encounter: Payer: Self-pay | Admitting: Family Medicine

## 2020-07-25 VITALS — BP 134/62 | HR 67 | Ht 60.0 in | Wt 134.0 lb

## 2020-07-25 DIAGNOSIS — M810 Age-related osteoporosis without current pathological fracture: Secondary | ICD-10-CM | POA: Diagnosis not present

## 2020-07-25 DIAGNOSIS — J302 Other seasonal allergic rhinitis: Secondary | ICD-10-CM | POA: Diagnosis not present

## 2020-07-25 DIAGNOSIS — I10 Essential (primary) hypertension: Secondary | ICD-10-CM | POA: Diagnosis not present

## 2020-07-25 DIAGNOSIS — Z23 Encounter for immunization: Secondary | ICD-10-CM

## 2020-07-25 DIAGNOSIS — M130 Polyarthritis, unspecified: Secondary | ICD-10-CM | POA: Diagnosis not present

## 2020-07-25 DIAGNOSIS — K219 Gastro-esophageal reflux disease without esophagitis: Secondary | ICD-10-CM

## 2020-07-25 MED ORDER — LORATADINE 10 MG PO TABS: 10.0000 mg | ORAL_TABLET | Freq: Every day | ORAL | 3 refills | Status: DC

## 2020-07-25 NOTE — Progress Notes (Signed)
BP 134/62   Pulse 67   Ht 5' (1.524 m)   Wt 134 lb (60.8 kg)   SpO2 97%   BMI 26.17 kg/m    Subjective:   Patient ID: [name], female    DOB: [birth year], 87 y.o.   MRN: [id number]  HPI: [name] is a 87 y.o. female presenting on 07/25/2020 for Medical Management of Chronic Issues and Hypertension   HPI Hypertension Patient is currently on no medication, and their blood pressure today is 134/62. Patient denies any lightheadedness or dizziness. Patient denies headaches, blurred vision, chest pains, shortness of breath, or weakness. Denies any side effects from medication and is content with current medication.   Osteoporosis/osteopenia Fractures or history of fracture: None Medication: Fosamax Duration of treatment: 6 months Last bone density scan: 6 months ago Last T score: -3.1  GERD Patient is currently on no medication.  She denies any major symptoms or abdominal pain or belching or burping. She denies any blood in her stool or lightheadedness or dizziness.   Patient also complains today of multiple joint arthritis that hurts throughout body, sometimes will be a shoulder that her hands that she has a lot of swelling and inflammation with them.  Relevant past medical, surgical, family and social history reviewed and updated as indicated. Interim medical history since our last visit reviewed. Allergies and medications reviewed and updated.  Review of Systems  Constitutional: Negative for chills and fever.  Eyes: Negative for visual disturbance.  Respiratory: Negative for chest tightness and shortness of breath.   Cardiovascular: Negative for chest pain and leg swelling.  Musculoskeletal: Positive for arthralgias. Negative for back pain and gait problem.  Skin: Negative for rash.  Neurological: Negative for dizziness, light-headedness and headaches.  Psychiatric/Behavioral: Negative for agitation and behavioral problems.  All other systems reviewed and are  negative.   Per HPI unless specifically indicated above   Allergies as of 07/25/2020      Reactions   Lisinopril Swelling   Tongue swelling      Medication List       Accurate as of July 25, 2020 11:31 AM. If you have any questions, ask your nurse or doctor.        alendronate 70 MG tablet Commonly known as: FOSAMAX Take 1 tablet (70 mg total) by mouth every 7 (seven) days. Take with a full glass of water on an empty stomach.   bacitracin-polymyxin b ophthalmic ointment Commonly known as: POLYSPORIN Place 1 application into both eyes 2 (two) times daily. apply to eye every 12 hours while awake   doxycycline 100 MG tablet Commonly known as: VIBRA-TABS Take 1 tablet (100 mg total) by mouth 2 (two) times daily.   loratadine 10 MG tablet Commonly known as: CLARITIN Take 1 tablet (10 mg total) by mouth daily. What changed: additional instructions Changed by: Fransisca Kaufmann Kahley Leib, MD        Objective:   BP 134/62   Pulse 67   Ht 5' (1.524 m)   Wt 134 lb (60.8 kg)   SpO2 97%   BMI 26.17 kg/m   Wt Readings from Last 3 Encounters:  07/25/20 134 lb (60.8 kg)  07/17/20 134 lb (60.8 kg)  01/23/20 132 lb (59.9 kg)    Physical Exam Vitals and nursing note reviewed.  Constitutional:      General: She is not in acute distress.    Appearance: She is well-developed. She is not diaphoretic.  Eyes:     Conjunctiva/sclera: Conjunctivae  normal.  Cardiovascular:     Rate and Rhythm: Normal rate and regular rhythm.     Heart sounds: Normal heart sounds. No murmur heard.   Pulmonary:     Effort: Pulmonary effort is normal. No respiratory distress.     Breath sounds: Normal breath sounds. No wheezing.  Musculoskeletal:        General: No tenderness (No joint tenderness on exam today.). Normal range of motion.  Skin:    General: Skin is warm and dry.     Findings: No rash.  Neurological:     Mental Status: She is alert and oriented to person, place, and time.      Coordination: Coordination normal.  Psychiatric:        Behavior: Behavior normal.       Assessment & Plan:   Problem List Items Addressed This Visit      Cardiovascular and Mediastinum   HTN (hypertension) - Primary     Digestive   GERD (gastroesophageal reflux disease)     Musculoskeletal and Integument   Osteoporosis    Other Visit Diagnoses    Need for Streptococcus pneumoniae vaccination       Relevant Orders   Pneumococcal conjugate vaccine 13-valent (Completed)   Polyarthritis       Relevant Orders   Ambulatory referral to Rheumatology   Seasonal allergies       Relevant Medications   loratadine (CLARITIN) 10 MG tablet      Patient's eyes are likely related to allergies, will send Claritin, apparently she has not been on it and is likely why she is having a lot of issues.    Follow up plan: Return in about 6 months (around 01/24/2021), or if symptoms worsen or fail to improve, for Hypertension and GERD and osteoporosis.  Counseling provided for all of the vaccine components Orders Placed This Encounter  Procedures  . Pneumococcal conjugate vaccine 13-valent  . Ambulatory referral to Rheumatology    Caryl Pina, MD [facility] 07/25/2020, 11:31 AM

## 2020-07-26 LAB — CBC WITH DIFFERENTIAL/PLATELET
Basophils Absolute: 0 10*3/uL (ref 0.0–0.2)
Basos: 0 %
EOS (ABSOLUTE): 0.2 10*3/uL (ref 0.0–0.4)
Eos: 2 %
Hematocrit: 38.6 % (ref 34.0–46.6)
Hemoglobin: 13 g/dL (ref 11.1–15.9)
Immature Grans (Abs): 0 10*3/uL (ref 0.0–0.1)
Immature Granulocytes: 0 %
Lymphocytes Absolute: 1.9 10*3/uL (ref 0.7–3.1)
Lymphs: 21 %
MCH: 28.7 pg (ref 26.6–33.0)
MCHC: 33.7 g/dL (ref 31.5–35.7)
MCV: 85 fL (ref 79–97)
Monocytes Absolute: 0.6 10*3/uL (ref 0.1–0.9)
Monocytes: 7 %
Neutrophils Absolute: 6.3 10*3/uL (ref 1.4–7.0)
Neutrophils: 70 %
Platelets: 339 10*3/uL (ref 150–450)
RBC: 4.53 x10E6/uL (ref 3.77–5.28)
RDW: 14.2 % (ref 11.7–15.4)
WBC: 9 10*3/uL (ref 3.4–10.8)

## 2020-07-26 LAB — CMP14+EGFR
ALT: 15 IU/L (ref 0–32)
AST: 23 IU/L (ref 0–40)
Albumin/Globulin Ratio: 1.3 (ref 1.2–2.2)
Albumin: 4.2 g/dL (ref 3.6–4.6)
Alkaline Phosphatase: 102 IU/L (ref 44–121)
BUN/Creatinine Ratio: 15 (ref 12–28)
BUN: 11 mg/dL (ref 8–27)
Bilirubin Total: 0.3 mg/dL (ref 0.0–1.2)
CO2: 22 mmol/L (ref 20–29)
Calcium: 9.5 mg/dL (ref 8.7–10.3)
Chloride: 105 mmol/L (ref 96–106)
Creatinine, Ser: 0.71 mg/dL (ref 0.57–1.00)
Globulin, Total: 3.2 g/dL (ref 1.5–4.5)
Glucose: 93 mg/dL (ref 65–99)
Potassium: 4.9 mmol/L (ref 3.5–5.2)
Sodium: 145 mmol/L — ABNORMAL HIGH (ref 134–144)
Total Protein: 7.4 g/dL (ref 6.0–8.5)
eGFR: 83 mL/min/{1.73_m2} (ref >59)

## 2020-07-26 LAB — LIPID PANEL
Chol/HDL Ratio: 7.1 ratio — ABNORMAL HIGH (ref 0.0–4.4)
Cholesterol, Total: 271 mg/dL — ABNORMAL HIGH (ref 100–199)
HDL: 38 mg/dL — ABNORMAL LOW (ref >39)
LDL Chol Calc (NIH): 182 mg/dL — ABNORMAL HIGH (ref 0–99)
Triglycerides: 265 mg/dL — ABNORMAL HIGH (ref 0–149)
VLDL Cholesterol Cal: 51 mg/dL — ABNORMAL HIGH (ref 5–40)

## 2020-08-07 ENCOUNTER — Encounter: Payer: Self-pay | Admitting: Family Medicine

## 2020-08-08 DIAGNOSIS — H353131 Nonexudative age-related macular degeneration, bilateral, early dry stage: Secondary | ICD-10-CM | POA: Diagnosis not present

## 2020-08-08 DIAGNOSIS — H02132 Senile ectropion of right lower eyelid: Secondary | ICD-10-CM | POA: Diagnosis not present

## 2020-08-08 DIAGNOSIS — H04123 Dry eye syndrome of bilateral lacrimal glands: Secondary | ICD-10-CM | POA: Diagnosis not present

## 2020-08-08 DIAGNOSIS — Z961 Presence of intraocular lens: Secondary | ICD-10-CM | POA: Diagnosis not present

## 2020-08-21 DIAGNOSIS — M25571 Pain in right ankle and joints of right foot: Secondary | ICD-10-CM | POA: Diagnosis not present

## 2020-08-21 DIAGNOSIS — M25572 Pain in left ankle and joints of left foot: Secondary | ICD-10-CM | POA: Diagnosis not present

## 2020-11-23 ENCOUNTER — Telehealth: Payer: Self-pay | Admitting: *Deleted

## 2020-11-23 DIAGNOSIS — M130 Polyarthritis, unspecified: Secondary | ICD-10-CM

## 2020-11-23 NOTE — Telephone Encounter (Signed)
Pt was told to find out which RHEUMATOLOGIST she wanted to see and let Dr d know  Here is info:  Theresa Duty, MD - [facility]  [phone]

## 2020-11-26 NOTE — Telephone Encounter (Signed)
Placed referral to rheumatology for the patient

## 2020-11-27 NOTE — Telephone Encounter (Signed)
Spoke with pt's son. He is aware of referral. His sister has been helping with most of the pts appts. He wrote down the physicians name and number. He will make sister aware. Knows to call back if an appt has not been scheduled in the next few days.

## 2020-12-25 DIAGNOSIS — M79676 Pain in unspecified toe(s): Secondary | ICD-10-CM | POA: Diagnosis not present

## 2020-12-25 DIAGNOSIS — B351 Tinea unguium: Secondary | ICD-10-CM | POA: Diagnosis not present

## 2021-01-24 ENCOUNTER — Encounter: Payer: Self-pay | Admitting: Family Medicine

## 2021-01-24 ENCOUNTER — Other Ambulatory Visit: Payer: Self-pay

## 2021-01-24 ENCOUNTER — Ambulatory Visit (INDEPENDENT_AMBULATORY_CARE_PROVIDER_SITE_OTHER): Payer: Medicare Other | Admitting: Family Medicine

## 2021-01-24 VITALS — BP 154/73 | HR 66 | Temp 97.0°F | Ht 60.0 in | Wt 133.6 lb

## 2021-01-24 DIAGNOSIS — M810 Age-related osteoporosis without current pathological fracture: Secondary | ICD-10-CM | POA: Diagnosis not present

## 2021-01-24 DIAGNOSIS — K219 Gastro-esophageal reflux disease without esophagitis: Secondary | ICD-10-CM | POA: Diagnosis not present

## 2021-01-24 DIAGNOSIS — E785 Hyperlipidemia, unspecified: Secondary | ICD-10-CM | POA: Diagnosis not present

## 2021-01-24 DIAGNOSIS — Z23 Encounter for immunization: Secondary | ICD-10-CM | POA: Diagnosis not present

## 2021-01-24 DIAGNOSIS — I10 Essential (primary) hypertension: Secondary | ICD-10-CM | POA: Diagnosis not present

## 2021-01-24 MED ORDER — AMLODIPINE BESYLATE 2.5 MG PO TABS: 2.5000 mg | ORAL_TABLET | Freq: Every day | ORAL | 3 refills | Status: DC

## 2021-01-24 NOTE — Progress Notes (Signed)
BP (!) 154/73   Pulse 66   Temp (!) 97 F (36.1 C)   Ht 5' (1.524 m)   Wt 133 lb 9.6 oz (60.6 kg)   SpO2 98%   BMI 26.09 kg/m    Subjective:   Patient ID: [name], female    DOB: 06-10-1932, 87 y.o.   MRN: [id number]  HPI: [name] is a 87 y.o. female presenting on 01/24/2021 for Medical Management of Chronic Issues   HPI Hypertension Patient is currently on no medicine currently, and their blood pressure today is 154/73. Patient denies any lightheadedness or dizziness. Patient denies headaches, blurred vision, chest pains, shortness of breath, or weakness. Denies any side effects from medication and is content with current medication.   GERD Patient is currently on no medication currently has been doing well.  She denies any major symptoms or abdominal pain or belching or burping. She denies any blood in her stool or lightheadedness or dizziness.   Osteoporosis/osteopenia Fractures or history of fracture: None Medication: Alendronate Duration of treatment: 1 year Last bone density scan: 01/23/2020 Last T score: -3.6  Relevant past medical, surgical, family and social history reviewed and updated as indicated. Interim medical history since our last visit reviewed. Allergies and medications reviewed and updated.  Review of Systems  Constitutional:  Negative for chills and fever.  Eyes:  Negative for visual disturbance.  Respiratory:  Negative for chest tightness and shortness of breath.   Cardiovascular:  Negative for chest pain and leg swelling.  Musculoskeletal:  Negative for back pain and gait problem.  Skin:  Negative for rash.  Neurological:  Negative for light-headedness and headaches.  Psychiatric/Behavioral:  Negative for agitation and behavioral problems.   All other systems reviewed and are negative.  Per HPI unless specifically indicated above   Allergies as of 01/24/2021       Reactions   Lisinopril Swelling   Tongue swelling         Medication List        Accurate as of January 24, 2021 11:54 AM. If you have any questions, ask your nurse or doctor.          alendronate 70 MG tablet Commonly known as: FOSAMAX Take 1 tablet (70 mg total) by mouth every 7 (seven) days. Take with a full glass of water on an empty stomach.   amLODipine 2.5 MG tablet Commonly known as: NORVASC Take 1 tablet (2.5 mg total) by mouth daily. Started by: Worthy Rancher, MD   bacitracin-polymyxin b ophthalmic ointment Commonly known as: POLYSPORIN Place 1 application into both eyes 2 (two) times daily. apply to eye every 12 hours while awake   doxycycline 100 MG tablet Commonly known as: VIBRA-TABS Take 1 tablet (100 mg total) by mouth 2 (two) times daily.   loratadine 10 MG tablet Commonly known as: CLARITIN Take 1 tablet (10 mg total) by mouth daily.         Objective:   BP (!) 154/73   Pulse 66   Temp (!) 97 F (36.1 C)   Ht 5' (1.524 m)   Wt 133 lb 9.6 oz (60.6 kg)   SpO2 98%   BMI 26.09 kg/m   Wt Readings from Last 3 Encounters:  01/24/21 133 lb 9.6 oz (60.6 kg)  07/25/20 134 lb (60.8 kg)  07/17/20 134 lb (60.8 kg)    Physical Exam Vitals and nursing note reviewed.  Constitutional:      General: She is not in acute  distress.    Appearance: She is well-developed. She is not diaphoretic.  Eyes:     Conjunctiva/sclera: Conjunctivae normal.  Cardiovascular:     Rate and Rhythm: Normal rate and regular rhythm.     Heart sounds: Normal heart sounds. No murmur heard. Pulmonary:     Effort: Pulmonary effort is normal. No respiratory distress.     Breath sounds: Normal breath sounds. No wheezing.  Musculoskeletal:        General: No swelling or tenderness. Normal range of motion.  Skin:    General: Skin is warm and dry.     Findings: No rash.  Neurological:     Mental Status: She is alert and oriented to person, place, and time.     Coordination: Coordination normal.  Psychiatric:        Behavior:  Behavior normal.      Assessment & Plan:   Problem List Items Addressed This Visit       Cardiovascular and Mediastinum   HTN (hypertension)   Relevant Medications   amLODipine (NORVASC) 2.5 MG tablet   Other Relevant Orders   CMP14+EGFR   Lipid panel     Digestive   GERD (gastroesophageal reflux disease)   Relevant Orders   CBC with Differential/Platelet     Musculoskeletal and Integument   Osteoporosis - Primary   Relevant Orders   CBC with Differential/Platelet   CMP14+EGFR   Lipid panel   VITAMIN D 25 Hydroxy (Vit-D Deficiency, Fractures)     Other   Dyslipidemia   Relevant Orders   Lipid panel   Other Visit Diagnoses     Need for immunization against influenza       Relevant Orders   Flu Vaccine QUAD High Dose(Fluad) (Completed)       Will add amlodipine for blood pressure Follow up plan: Return in about 6 months (around 07/25/2021), or if symptoms worsen or fail to improve, for Hypertension GERD and osteopenia.  Counseling provided for all of the vaccine components Orders Placed This Encounter  Procedures   Flu Vaccine QUAD High Dose(Fluad)   CBC with Differential/Platelet   CMP14+EGFR   Lipid panel   VITAMIN D 25 Hydroxy (Vit-D Deficiency, Fractures)     Caryl Pina, MD [facility] 01/24/2021, 11:54 AM

## 2021-01-25 LAB — CBC WITH DIFFERENTIAL/PLATELET
Basophils Absolute: 0.1 10*3/uL (ref 0.0–0.2)
Basos: 1 %
EOS (ABSOLUTE): 0.3 10*3/uL (ref 0.0–0.4)
Eos: 4 %
Hematocrit: 37.6 % (ref 34.0–46.6)
Hemoglobin: 12.5 g/dL (ref 11.1–15.9)
Immature Grans (Abs): 0 10*3/uL (ref 0.0–0.1)
Immature Granulocytes: 0 %
Lymphocytes Absolute: 1.7 10*3/uL (ref 0.7–3.1)
Lymphs: 22 %
MCH: 29 pg (ref 26.6–33.0)
MCHC: 33.2 g/dL (ref 31.5–35.7)
MCV: 87 fL (ref 79–97)
Monocytes Absolute: 0.5 10*3/uL (ref 0.1–0.9)
Monocytes: 7 %
Neutrophils Absolute: 4.9 10*3/uL (ref 1.4–7.0)
Neutrophils: 66 %
Platelets: 340 10*3/uL (ref 150–450)
RBC: 4.31 x10E6/uL (ref 3.77–5.28)
RDW: 13.9 % (ref 11.7–15.4)
WBC: 7.4 10*3/uL (ref 3.4–10.8)

## 2021-01-25 LAB — LIPID PANEL
Chol/HDL Ratio: 8.2 ratio — ABNORMAL HIGH (ref 0.0–4.4)
Cholesterol, Total: 287 mg/dL — ABNORMAL HIGH (ref 100–199)
HDL: 35 mg/dL — ABNORMAL LOW (ref >39)
LDL Chol Calc (NIH): 204 mg/dL — ABNORMAL HIGH (ref 0–99)
Triglycerides: 242 mg/dL — ABNORMAL HIGH (ref 0–149)
VLDL Cholesterol Cal: 48 mg/dL — ABNORMAL HIGH (ref 5–40)

## 2021-01-25 LAB — CMP14+EGFR
ALT: 10 IU/L (ref 0–32)
AST: 20 IU/L (ref 0–40)
Albumin/Globulin Ratio: 1.3 (ref 1.2–2.2)
Albumin: 4 g/dL (ref 3.6–4.6)
Alkaline Phosphatase: 75 IU/L (ref 44–121)
BUN/Creatinine Ratio: 13 (ref 12–28)
BUN: 9 mg/dL (ref 8–27)
Bilirubin Total: 0.3 mg/dL (ref 0.0–1.2)
CO2: 21 mmol/L (ref 20–29)
Calcium: 8.9 mg/dL (ref 8.7–10.3)
Chloride: 103 mmol/L (ref 96–106)
Creatinine, Ser: 0.68 mg/dL (ref 0.57–1.00)
Globulin, Total: 3.1 g/dL (ref 1.5–4.5)
Glucose: 90 mg/dL (ref 70–99)
Potassium: 4.5 mmol/L (ref 3.5–5.2)
Sodium: 140 mmol/L (ref 134–144)
Total Protein: 7.1 g/dL (ref 6.0–8.5)
eGFR: 85 mL/min/{1.73_m2} (ref >59)

## 2021-01-25 LAB — VITAMIN D 25 HYDROXY (VIT D DEFICIENCY, FRACTURES): Vit D, 25-Hydroxy: 33.1 ng/mL (ref 30.0–100.0)

## 2021-02-01 ENCOUNTER — Other Ambulatory Visit: Payer: Self-pay

## 2021-02-01 MED ORDER — ATORVASTATIN CALCIUM 40 MG PO TABS: 40.0000 mg | ORAL_TABLET | Freq: Every evening | ORAL | 1 refills | Status: DC

## 2021-02-18 ENCOUNTER — Ambulatory Visit (INDEPENDENT_AMBULATORY_CARE_PROVIDER_SITE_OTHER): Payer: Medicare Other

## 2021-02-18 ENCOUNTER — Telehealth: Payer: Self-pay

## 2021-02-18 VITALS — Ht 60.0 in | Wt 133.0 lb

## 2021-02-18 DIAGNOSIS — H539 Unspecified visual disturbance: Secondary | ICD-10-CM | POA: Diagnosis not present

## 2021-02-18 DIAGNOSIS — Z0001 Encounter for general adult medical examination with abnormal findings: Secondary | ICD-10-CM

## 2021-02-18 DIAGNOSIS — Z0000 Encounter for general adult medical examination without abnormal findings: Secondary | ICD-10-CM

## 2021-02-18 DIAGNOSIS — I10 Essential (primary) hypertension: Secondary | ICD-10-CM | POA: Diagnosis not present

## 2021-02-18 NOTE — Patient Instructions (Signed)
[name] , Thank you for taking time to come for your Medicare Wellness Visit. I appreciate your ongoing commitment to your health goals. Please review the following plan we discussed and let me know if I can assist you in the future.   Screening recommendations/referrals: Colonoscopy: No longer required due to age.  Mammogram: No longer required due to age.  Bone Density: Done 02/02/2020. Repeat every 2 years  Recommended yearly ophthalmology/optometry visit for glaucoma screening and checkup Recommended yearly dental visit for hygiene and checkup  Vaccinations: Influenza vaccine: Done 01/24/2021 Pneumococcal vaccine: Done 07/25/2020 Tdap vaccine: Done 01/16/2017 Shingles vaccine: Shingrix discussed. Please contact your pharmacy for coverage information.     Covid-19:Done 06/27/2019, 07/25/2019, 04/25/2020 Advanced directives: Advance directive discussed with you today. I have provided a copy for you to complete at home and have notarized. Once this is complete please bring a copy in to our office so we can scan it into your chart. Copy mailed 02/18/2021  Conditions/risks identified: KEEP UP THE GOOD WORK!!  Next appointment: Follow up in one year for your annual wellness visit 2023.    Preventive Care 43 Years and Older, Female Preventive care refers to lifestyle choices and visits with your health care provider that can promote health and wellness. What does preventive care include? A yearly physical exam. This is also called an annual well check. Dental exams once or twice a year. Routine eye exams. Ask your health care provider how often you should have your eyes checked. Personal lifestyle choices, including: Daily care of your teeth and gums. Regular physical activity. Eating a healthy diet. Avoiding tobacco and drug use. Limiting alcohol use. Practicing safe sex. Taking low-dose aspirin every day. Taking vitamin and mineral supplements as recommended by your health care  provider. What happens during an annual well check? The services and screenings done by your health care provider during your annual well check will depend on your age, overall health, lifestyle risk factors, and family history of disease. Counseling  Your health care provider may ask you questions about your: Alcohol use. Tobacco use. Drug use. Emotional well-being. Home and relationship well-being. Sexual activity. Eating habits. History of falls. Memory and ability to understand (cognition). Work and work Statistician. Reproductive health. Screening  You may have the following tests or measurements: Height, weight, and BMI. Blood pressure. Lipid and cholesterol levels. These may be checked every 5 years, or more frequently if you are over 22 years old. Skin check. Lung cancer screening. You may have this screening every year starting at age 47 if you have a 30-pack-year history of smoking and currently smoke or have quit within the past 15 years. Fecal occult blood test (FOBT) of the stool. You may have this test every year starting at age 37. Flexible sigmoidoscopy or colonoscopy. You may have a sigmoidoscopy every 5 years or a colonoscopy every 10 years starting at age 91. Hepatitis C blood test. Hepatitis B blood test. Sexually transmitted disease (STD) testing. Diabetes screening. This is done by checking your blood sugar (glucose) after you have not eaten for a while (fasting). You may have this done every 1-3 years. Bone density scan. This is done to screen for osteoporosis. You may have this done starting at age 37. Mammogram. This may be done every 1-2 years. Talk to your health care provider about how often you should have regular mammograms. Talk with your health care provider about your test results, treatment options, and if necessary, the need for more tests.  Vaccines  Your health care provider may recommend certain vaccines, such as: Influenza vaccine. This is  recommended every year. Tetanus, diphtheria, and acellular pertussis (Tdap, Td) vaccine. You may need a Td booster every 10 years. Zoster vaccine. You may need this after age 47. Pneumococcal 13-valent conjugate (PCV13) vaccine. One dose is recommended after age 54. Pneumococcal polysaccharide (PPSV23) vaccine. One dose is recommended after age 57. Talk to your health care provider about which screenings and vaccines you need and how often you need them. This information is not intended to replace advice given to you by your health care provider. Make sure you discuss any questions you have with your health care provider. Document Released: 05/04/2015 Document Revised: 12/26/2015 Document Reviewed: 02/06/2015 Elsevier Interactive Patient Education  2017 [employer] Prevention in the Home Falls can cause injuries. They can happen to people of all ages. There are many things you can do to make your home safe and to help prevent falls. What can I do on the outside of my home? Regularly fix the edges of walkways and driveways and fix any cracks. Remove anything that might make you trip as you walk through a door, such as a raised step or threshold. Trim any bushes or trees on the path to your home. Use bright outdoor lighting. Clear any walking paths of anything that might make someone trip, such as rocks or tools. Regularly check to see if handrails are loose or broken. Make sure that both sides of any steps have handrails. Any raised decks and porches should have guardrails on the edges. Have any leaves, snow, or ice cleared regularly. Use sand or salt on walking paths during winter. Clean up any spills in your garage right away. This includes oil or grease spills. What can I do in the bathroom? Use night lights. Install grab bars by the toilet and in the tub and shower. Do not use towel bars as grab bars. Use non-skid mats or decals in the tub or shower. If you need to sit down in  the shower, use a plastic, non-slip stool. Keep the floor dry. Clean up any water that spills on the floor as soon as it happens. Remove soap buildup in the tub or shower regularly. Attach bath mats securely with double-sided non-slip rug tape. Do not have throw rugs and other things on the floor that can make you trip. What can I do in the bedroom? Use night lights. Make sure that you have a light by your bed that is easy to reach. Do not use any sheets or blankets that are too big for your bed. They should not hang down onto the floor. Have a firm chair that has side arms. You can use this for support while you get dressed. Do not have throw rugs and other things on the floor that can make you trip. What can I do in the kitchen? Clean up any spills right away. Avoid walking on wet floors. Keep items that you use a lot in easy-to-reach places. If you need to reach something above you, use a strong step stool that has a grab bar. Keep electrical cords out of the way. Do not use floor polish or wax that makes floors slippery. If you must use wax, use non-skid floor wax. Do not have throw rugs and other things on the floor that can make you trip. What can I do with my stairs? Do not leave any items on the stairs. Make sure that  there are handrails on both sides of the stairs and use them. Fix handrails that are broken or loose. Make sure that handrails are as long as the stairways. Check any carpeting to make sure that it is firmly attached to the stairs. Fix any carpet that is loose or worn. Avoid having throw rugs at the top or bottom of the stairs. If you do have throw rugs, attach them to the floor with carpet tape. Make sure that you have a light switch at the top of the stairs and the bottom of the stairs. If you do not have them, ask someone to add them for you. What else can I do to help prevent falls? Wear shoes that: Do not have high heels. Have rubber bottoms. Are comfortable  and fit you well. Are closed at the toe. Do not wear sandals. If you use a stepladder: Make sure that it is fully opened. Do not climb a closed stepladder. Make sure that both sides of the stepladder are locked into place. Ask someone to hold it for you, if possible. Clearly mark and make sure that you can see: Any grab bars or handrails. First and last steps. Where the edge of each step is. Use tools that help you move around (mobility aids) if they are needed. These include: Canes. Walkers. Scooters. Crutches. Turn on the lights when you go into a dark area. Replace any light bulbs as soon as they burn out. Set up your furniture so you have a clear path. Avoid moving your furniture around. If any of your floors are uneven, fix them. If there are any pets around you, be aware of where they are. Review your medicines with your doctor. Some medicines can make you feel dizzy. This can increase your chance of falling. Ask your doctor what other things that you can do to help prevent falls. This information is not intended to replace advice given to you by your health care provider. Make sure you discuss any questions you have with your health care provider. Document Released: 02/01/2009 Document Revised: 09/13/2015 Document Reviewed: 05/12/2014 Elsevier Interactive Patient Education  2017 [employer].

## 2021-02-18 NOTE — Telephone Encounter (Signed)
Called pt for AWV. Pt sates via son, [name], who is translating that she is still having issues with dizzy spell and her blood pressure is still slightly elevated. Pt has bee on Amlodipine 2.5 mg since 01/25/2021. Thank you.

## 2021-02-18 NOTE — Telephone Encounter (Signed)
Called pt for AWV. Pt and son, [name], request Rx for shower chair and bedside commode due to patient's mobility issues and osteoarthritis. Can Rx be sent to [place]? Thank you!

## 2021-02-18 NOTE — Progress Notes (Signed)
Subjective:   [name] is a 87 y.o. female who presents for Medicare Annual (Subsequent) preventive examination. Virtual Visit via Telephone Note  I connected with  [name] on 02/18/21 at  3:30 PM EDT by telephone and verified that I am speaking with the correct person using two identifiers.  Location: Patient: Home Provider: [facility] Persons participating in the virtual visit: patient/Nurse Health Advisor   I discussed the limitations, risks, security and privacy concerns of performing an evaluation and management service by telephone and the availability of in person appointments. The patient expressed understanding and agreed to proceed.  Interactive audio and video telecommunications were attempted between this nurse and patient, however failed, due to patient having technical difficulties OR patient did not have access to video capability.  We continued and completed visit with audio only.  Some vital signs may be absent or patient reported.   [name] Driver, LPN  Review of Systems     Cardiac Risk Factors include: advanced age (>70men, >2 women);hypertension;sedentary lifestyle;dyslipidemia     Objective:    Today's Vitals   02/18/21 1528  Weight: 133 lb (60.3 kg)  Height: 5' (1.524 m)   Body mass index is 25.97 kg/m.  Advanced Directives 02/18/2021 01/29/2016 01/01/2016 12/28/2015 06/18/2015 02/21/2015 01/05/2015  Does Patient Have a Medical Advance Directive? No No No No No No No  Would patient like information on creating a medical advance directive? No - Patient declined No - patient declined information No - patient declined information - No - patient declined information Yes - Educational materials given No - patient declined information    Current Medications (verified) Outpatient Encounter Medications as of 02/18/2021  Medication Sig   alendronate (FOSAMAX) 70 MG tablet Take 1 tablet (70 mg total) by mouth every 7 (seven) days. Take with a full glass  of water on an empty stomach.   amLODipine (NORVASC) 2.5 MG tablet Take 1 tablet (2.5 mg total) by mouth daily.   atorvastatin (LIPITOR) 40 MG tablet Take 1 tablet (40 mg total) by mouth at bedtime.   bacitracin-polymyxin b (POLYSPORIN) ophthalmic ointment Place 1 application into both eyes 2 (two) times daily. apply to eye every 12 hours while awake   doxycycline (VIBRA-TABS) 100 MG tablet Take 1 tablet (100 mg total) by mouth 2 (two) times daily.   loratadine (CLARITIN) 10 MG tablet Take 1 tablet (10 mg total) by mouth daily.   No facility-administered encounter medications on file as of 02/18/2021.    Allergies (verified) Lisinopril   History: Past Medical History:  Diagnosis Date   Arthritis of right foot    Hx: UTI (urinary tract infection)    Hypertension    Palpitations    Unsteady gait    Past Surgical History:  Procedure Laterality Date   CATARACT EXTRACTION W/PHACO Right 01/01/2016   Procedure: CATARACT EXTRACTION PHACO AND INTRAOCULAR LENS PLACEMENT RIGHT EYE;  Surgeon: Rutherford Guys, MD;  Location: [facility];  Service: Ophthalmology;  Laterality: Right;  CDE: 9.69   CATARACT EXTRACTION W/PHACO Left 01/29/2016   Procedure: CATARACT EXTRACTION PHACO AND INTRAOCULAR LENS PLACEMENT (IOC);  Surgeon: Rutherford Guys, MD;  Location: [facility];  Service: Ophthalmology;  Laterality: Left;  CDE: 9.32   None     Family History  Problem Relation Age of Onset   Heart attack Mother 42       Died in [place]   Healthy Daughter    Healthy Son    Healthy Son    Healthy Daughter  Allergic rhinitis Neg Hx    Angioedema Neg Hx    Asthma Neg Hx    Eczema Neg Hx    Immunodeficiency Neg Hx    Urticaria Neg Hx    Social History   Socioeconomic History   Marital status: Married    Spouse name: Not on file   Number of children: 4   Years of education: 4th grade   Highest education level: Not on file  Occupational History   Occupation: Homemaker  Tobacco Use   Smoking status: Never    Smokeless tobacco: Never  Vaping Use   Vaping Use: Never used  Substance and Sexual Activity   Alcohol use: No    Alcohol/week: 0.0 standard drinks   Drug use: Never   Sexual activity: Not on file  Other Topics Concern   Not on file  Social History Narrative   Lives at home with son and husband.     1 cup coffee per day.   Right-handed.   Social Determinants of Health   Financial Resource Strain: Low Risk    Difficulty of Paying Living Expenses: Not hard at all  Food Insecurity: No Food Insecurity   Worried About Charity fundraiser in the Last Year: Never true   [place] in the Last Year: Never true  Transportation Needs: No Transportation Needs   Lack of Transportation (Medical): No   Lack of Transportation (Non-Medical): No  Physical Activity: Insufficiently Active   Days of Exercise per Week: 5 days   Minutes of Exercise per Session: 20 min  Stress: No Stress Concern Present   Feeling of Stress : Not at all  Social Connections: Moderately Isolated   Frequency of Communication with Friends and Family: More than three times a week   Frequency of Social Gatherings with Friends and Family: More than three times a week   Attends Religious Services: Never   Marine scientist or Organizations: No   Attends Music therapist: Never   Marital Status: Married    Tobacco Counseling Counseling given: Not Answered   Clinical Intake:  Pre-visit preparation completed: Yes  Pain : No/denies pain     BMI - recorded: 25.97 Nutritional Status: BMI 25 -29 Overweight Nutritional Risks: None Diabetes: No  How often do you need to have someone help you when you read instructions, pamphlets, or other written materials from your doctor or pharmacy?: 1 - Never  Diabetic?NO  Interpreter Needed?: Yes Interpreter Name: [name] Patient Declined Interpreter : No Patient signed [place] waiver: No  Information entered by :: [name] Keirstyn Aydt,  LPN   Activities of Daily Living In your present state of health, do you have any difficulty performing the following activities: 02/18/2021  Hearing? N  Vision? N  Difficulty concentrating or making decisions? N  Walking or climbing stairs? N  Dressing or bathing? N  Doing errands, shopping? Y  Comment No longer drives.  Preparing Food and eating ? N  Using the Toilet? N  In the past six months, have you accidently leaked urine? N  Do you have problems with loss of bowel control? N  Managing your Medications? N  Managing your Finances? N  Housekeeping or managing your Housekeeping? Y  Comment Lives with son and daughter in law.  Some recent data might be hidden    Patient Care Team: Dettinger, Fransisca Kaufmann, MD as PCP - General (Family Medicine)  Indicate any recent Medical Services you may have received from  other than [facility] providers in the past year (date may be approximate).     Assessment:   This is a routine wellness examination for [place].  Hearing/Vision screen Hearing Screening - Comments:: No hearing issues.   Vision Screening - Comments:: Readers. Cataract surgery. Dr. Gershon Crane.   Dietary issues and exercise activities discussed: Current Exercise Habits: Home exercise routine, Type of exercise: walking, Time (Minutes): 25, Frequency (Times/Week): 5, Weekly Exercise (Minutes/Week): 125, Intensity: Mild, Exercise limited by: cardiac condition(s)   Goals Addressed             This Visit's Progress    Exercise 3x per week (30 min per time)       Be more active.        Depression Screen PHQ 2/9 Scores 02/18/2021 01/24/2021 07/25/2020 07/17/2020 01/23/2020 12/28/2019 06/16/2018  PHQ - 2 Score 0 0 0 0 0 0 0  PHQ- 9 Score - - - 0 - - -  Exception Documentation - - - - - - -  Not completed - - - - - - -    Fall Risk Fall Risk  02/18/2021 01/24/2021 07/25/2020 07/17/2020 01/23/2020  Falls in the past year? 0 0 0 0 1  Number falls in past yr: 0 - - - 0  Injury with Fall? 0  - - - 1  Comment - - - - -  Risk for fall due to : Impaired balance/gait;Impaired mobility;History of fall(s) - - - Impaired balance/gait;Impaired mobility  Follow up Falls prevention discussed - - - Falls evaluation completed    FALL RISK PREVENTION PERTAINING TO THE HOME:  Any stairs in or around the home? Yes  If so, are there any without handrails? No  Home free of loose throw rugs in walkways, pet beds, electrical cords, etc? Yes  Adequate lighting in your home to reduce risk of falls? Yes   ASSISTIVE DEVICES UTILIZED TO PREVENT FALLS:  Life alert? No  Use of a cane, walker or w/c? Yes  Grab bars in the bathroom? Yes  Shower chair or bench in shower? No  Elevated toilet seat or a handicapped toilet? No   TIMED UP AND GO:  Was the test performed? No . Phone visit.   Cognitive Function: MMSE - Mini Mental State Exam 06/18/2015 01/05/2015  Orientation to time 5 5  Orientation to Place 5 5  Registration 3 2  Attention/ Calculation 5 5  Recall 3 3  Language- name 2 objects 2 2  Language- repeat 1 1  Language- follow 3 step command 3 3  Language- read & follow direction 1 1  Write a sentence 1 1  Copy design 1 1  Total score 30 29     6CIT Screen 02/18/2021  What Year? 0 points  What month? 0 points  What time? 0 points  Count back from 20 0 points  Months in reverse 4 points  Repeat phrase 4 points  Total Score 8    Immunizations Immunization History  Administered Date(s) Administered   Fluad Quad(high Dose 65+) 01/24/2021   Influenza, High Dose Seasonal PF 02/05/2018   Moderna Sars-Covid-2 Vaccination 06/27/2019, 07/25/2019   PFIZER(Purple Top)SARS-COV-2 Vaccination 04/25/2020   Pneumococcal Conjugate-13 07/25/2020   Tdap 01/16/2017    TDAP status: Up to date  Flu Vaccine status: Up to date  Pneumococcal vaccine status: Up to date  Covid-19 vaccine status: Information provided on how to obtain vaccines.   Qualifies for Shingles Vaccine? Yes    Zostavax completed No  Shingrix Completed?: No.    Education has been provided regarding the importance of this vaccine. Patient has been advised to call insurance company to determine out of pocket expense if they have not yet received this vaccine. Advised may also receive vaccine at local pharmacy or Health Dept. Verbalized acceptance and understanding.  Screening Tests Health Maintenance  Topic Date Due   Zoster Vaccines- Shingrix (1 of 2) Never done   COVID-19 Vaccine (4 - Booster) 06/20/2020   Pneumonia Vaccine 71+ Years old (2 - PPSV23 if available, else PCV20) 07/25/2021   DEXA SCAN  01/22/2022   TETANUS/TDAP  01/17/2027   INFLUENZA VACCINE  Completed   HPV VACCINES  Aged Out    Health Maintenance  Health Maintenance Due  Topic Date Due   Zoster Vaccines- Shingrix (1 of 2) Never done   COVID-19 Vaccine (4 - Booster) 06/20/2020    Colorectal cancer screening: No longer required.   Mammogram status: No longer required due to age.  Bone Density status: Completed 01/23/2020. Results reflect: Bone density results: OSTEOPOROSIS. Repeat every 2 years.  Lung Cancer Screening: (Low Dose CT Chest recommended if Age 67-80 years, 30 pack-year currently smoking OR have quit w/in 15years.) does not qualify.     Additional Screening:  Hepatitis C Screening: does not qualify.  Vision Screening: Recommended annual ophthalmology exams for early detection of glaucoma and other disorders of the eye. Is the patient up to date with their annual eye exam?  No  Who is the provider or what is the name of the office in which the patient attends annual eye exams? Dr. Gershon Crane but would like to change to someone closer to home.  If pt is not established with a provider, would they like to be referred to a provider to establish care? Yes .   Dental Screening: Recommended annual dental exams for proper oral hygiene  Community Resource Referral / Chronic Care Management: CRR required this  visit?  No   CCM required this visit?  No      Plan:     I have personally reviewed and noted the following in the patient's chart:   Medical and social history Use of alcohol, tobacco or illicit drugs  Current medications and supplements including opioid prescriptions.  Functional ability and status Nutritional status Physical activity Advanced directives List of other physicians Hospitalizations, surgeries, and ER visits in previous 12 months Vitals Screenings to include cognitive, depression, and falls Referrals and appointments  In addition, I have reviewed and discussed with patient certain preventive protocols, quality metrics, and best practice recommendations. A written personalized care plan for preventive services as well as general preventive health recommendations were provided to patient.     [name] Driver, LPN   91/79/[id number]   Nurse Notes: Visit done with patient and pt's son, [name], who interpreted for his mom. Pt states she is doing well. Would like Optometrist closer to home and requests referral to [facility] in [place]. Referral placed. Discussed shingles vaccine. Up to date on Dexa. Mammogram and Colonoscopy no longer required due to age.

## 2021-02-20 NOTE — Telephone Encounter (Signed)
Tried calling son, [name]. No answer and vmail is full. If able to reach pt needs an appt for bedside commode and shower chair. Can be virtual and needs to be 30 min.

## 2021-02-20 NOTE — Telephone Encounter (Signed)
Per Juliann Pulse this does need a face to face appt. If needed it can be virtual. This needs to be a 30 min appt regardless. Tried calling son, [name] but mailbox is full.

## 2021-02-20 NOTE — Telephone Encounter (Signed)
Call and ask how much her blood pressure is elevated and how often she is having dizzy spells and see if it has improved at all but still having them or if she still having them as frequent.

## 2021-02-20 NOTE — Telephone Encounter (Signed)
I believe that these require a face to face visit for insurance coverage but asked Juliann Pulse for sure

## 2021-03-23 ENCOUNTER — Other Ambulatory Visit: Payer: Self-pay | Admitting: Family Medicine

## 2021-04-08 ENCOUNTER — Encounter: Payer: Self-pay | Admitting: Family Medicine

## 2021-04-08 ENCOUNTER — Ambulatory Visit (INDEPENDENT_AMBULATORY_CARE_PROVIDER_SITE_OTHER): Payer: Medicare Other | Admitting: Family Medicine

## 2021-04-08 VITALS — BP 137/74 | HR 69 | Ht 60.0 in | Wt 132.0 lb

## 2021-04-08 DIAGNOSIS — I10 Essential (primary) hypertension: Secondary | ICD-10-CM | POA: Diagnosis not present

## 2021-04-08 DIAGNOSIS — S81801A Unspecified open wound, right lower leg, initial encounter: Secondary | ICD-10-CM

## 2021-04-08 DIAGNOSIS — I771 Stricture of artery: Secondary | ICD-10-CM | POA: Diagnosis not present

## 2021-04-08 MED ORDER — NAPROXEN 500 MG PO TABS: 500.0000 mg | ORAL_TABLET | Freq: Two times a day (BID) | ORAL | 0 refills | Status: DC

## 2021-04-08 MED ORDER — AMLODIPINE BESYLATE 5 MG PO TABS: 5.0000 mg | ORAL_TABLET | Freq: Every day | ORAL | 3 refills | Status: DC

## 2021-04-08 NOTE — Progress Notes (Signed)
BP 137/74    Pulse 69    Ht 5' (1.524 m)    Wt 132 lb (59.9 kg)    SpO2 98%    BMI 25.78 kg/m    Subjective:   Patient ID: [name], female    DOB: 17-Aug-1932, 87 y.o.   MRN: [id number]  HPI: [name] is a 87 y.o. female presenting on 04/08/2021 for Wound Check (foot)   HPI Patient is coming in for a wound on her right inner ankle.  She says this popped up about 2 days ago.  She says she was having a lot of itching and dryness on her legs and then she is scratching at it and then noticed a wound on her inner ankle.  She says is been very painful.  She denies any drainage and has been using topical antibiotic cream on it.  Hypertension Patient is currently on amlodipine, and their blood pressure today is 142/66 for this at home its been very commonly in the 160s and 170s.. Patient denies any lightheadedness or dizziness. Patient denies headaches, blurred vision, chest pains, shortness of breath, or weakness. Denies any side effects from medication and is content with current medication.   Relevant past medical, surgical, family and social history reviewed and updated as indicated. Interim medical history since our last visit reviewed. Allergies and medications reviewed and updated.  Review of Systems  Constitutional:  Negative for chills and fever.  Eyes:  Negative for visual disturbance.  Respiratory:  Negative for chest tightness and shortness of breath.   Cardiovascular:  Negative for chest pain and leg swelling.  Musculoskeletal:  Negative for back pain and gait problem.  Skin:  Positive for wound. Negative for rash.  Neurological:  Negative for dizziness, light-headedness and headaches.  Psychiatric/Behavioral:  Negative for agitation and behavioral problems.   All other systems reviewed and are negative.  Per HPI unless specifically indicated above   Allergies as of 04/08/2021       Reactions   Lisinopril Swelling   Tongue swelling        Medication List         Accurate as of April 08, 2021 11:36 AM. If you have any questions, ask your nurse or doctor.          alendronate 70 MG tablet Commonly known as: FOSAMAX TAKE 1 TABLET BY MOUTH EVERY 7 (SEVEN) DAYS. TAKE WITH A FULL GLASS OF WATER ON AN EMPTY STOMACH.   amLODipine 5 MG tablet Commonly known as: NORVASC Take 1 tablet (5 mg total) by mouth daily. What changed:  medication strength how much to take Changed by: Fransisca Kaufmann Lurena Naeve, MD   atorvastatin 40 MG tablet Commonly known as: LIPITOR Take 1 tablet (40 mg total) by mouth at bedtime.   bacitracin-polymyxin b ophthalmic ointment Commonly known as: POLYSPORIN Place 1 application into both eyes 2 (two) times daily. apply to eye every 12 hours while awake   doxycycline 100 MG tablet Commonly known as: VIBRA-TABS Take 1 tablet (100 mg total) by mouth 2 (two) times daily.   loratadine 10 MG tablet Commonly known as: CLARITIN Take 1 tablet (10 mg total) by mouth daily.   naproxen 500 MG tablet Commonly known as: Naprosyn Take 1 tablet (500 mg total) by mouth 2 (two) times daily with a meal. Started by: Fransisca Kaufmann Oluwadara Gorman, MD         Objective:   BP 137/74    Pulse 69    Ht 5' (1.524  m)    Wt 132 lb (59.9 kg)    SpO2 98%    BMI 25.78 kg/m   Wt Readings from Last 3 Encounters:  04/08/21 132 lb (59.9 kg)  02/18/21 133 lb (60.3 kg)  01/24/21 133 lb 9.6 oz (60.6 kg)    Physical Exam Vitals and nursing note reviewed.  Constitutional:      Appearance: Normal appearance.  Skin:    Findings: Wound (Superficial 1 x 0.5 cm wound on right inner lower leg near the ankle, drier white tissue base and tender to palpation, no erythema) present.  Neurological:     Mental Status: She is alert.      Assessment & Plan:   Problem List Items Addressed This Visit       Cardiovascular and Mediastinum   HTN (hypertension)   Relevant Medications   amLODipine (NORVASC) 5 MG tablet   Other Visit Diagnoses      Wound of right lower extremity, initial encounter    -  Primary   Relevant Medications   naproxen (NAPROSYN) 500 MG tablet   Arterial insufficiency (HCC)       Relevant Medications   amLODipine (NORVASC) 5 MG tablet       Increase amlodipine because at home blood pressures been elevated.  Sent new prescription.  Nurse to place [employer] Follow up plan: Return in about 1 week (around 04/15/2021), or if symptoms worsen or fail to improve, for Wound recheck and Unna boot recheck.  Counseling provided for all of the vaccine components No orders of the defined types were placed in this encounter.   Caryl Pina, MD [facility] 04/08/2021, 11:36 AM

## 2021-04-11 ENCOUNTER — Telehealth: Payer: Self-pay | Admitting: Family Medicine

## 2021-04-11 ENCOUNTER — Encounter: Payer: Self-pay | Admitting: Family Medicine

## 2021-04-11 ENCOUNTER — Ambulatory Visit: Payer: Medicare Other | Admitting: Family Medicine

## 2021-04-11 ENCOUNTER — Ambulatory Visit (INDEPENDENT_AMBULATORY_CARE_PROVIDER_SITE_OTHER): Payer: Medicare Other | Admitting: Family Medicine

## 2021-04-11 VITALS — BP 131/89 | HR 74 | Ht 60.0 in | Wt 132.0 lb

## 2021-04-11 DIAGNOSIS — S81801D Unspecified open wound, right lower leg, subsequent encounter: Secondary | ICD-10-CM | POA: Diagnosis not present

## 2021-04-11 NOTE — Telephone Encounter (Signed)
Spoke with son. They now have a ride for today. Worked pt back in a 3 today.

## 2021-04-11 NOTE — Progress Notes (Signed)
BP 131/89    Pulse 74    Ht 5' (1.524 m)    Wt 132 lb (59.9 kg)    SpO2 99%    BMI 25.78 kg/m    Subjective:   Patient ID: [name], female    DOB: 02/02/1933, 87 y.o.   MRN: [id number]  HPI: [name] is a 87 y.o. female presenting on 04/11/2021 for Wound Check (RLE)   HPI Right lower extremity wound Patient is coming in for right lower extremity wound recheck.  She wore Unna boot for a week.  She still is having pain with it but it is improving  Relevant past medical, surgical, family and social history reviewed and updated as indicated. Interim medical history since our last visit reviewed. Allergies and medications reviewed and updated.  Review of Systems  Constitutional:  Negative for chills and fever.  Skin:  Positive for wound. Negative for color change.   Per HPI unless specifically indicated above   Allergies as of 04/11/2021       Reactions   Lisinopril Swelling   Tongue swelling        Medication List        Accurate as of April 11, 2021  3:39 PM. If you have any questions, ask your nurse or doctor.          alendronate 70 MG tablet Commonly known as: FOSAMAX TAKE 1 TABLET BY MOUTH EVERY 7 (SEVEN) DAYS. TAKE WITH A FULL GLASS OF WATER ON AN EMPTY STOMACH.   amLODipine 5 MG tablet Commonly known as: NORVASC Take 1 tablet (5 mg total) by mouth daily.   atorvastatin 40 MG tablet Commonly known as: LIPITOR Take 1 tablet (40 mg total) by mouth at bedtime.   bacitracin-polymyxin b ophthalmic ointment Commonly known as: POLYSPORIN Place 1 application into both eyes 2 (two) times daily. apply to eye every 12 hours while awake   doxycycline 100 MG tablet Commonly known as: VIBRA-TABS Take 1 tablet (100 mg total) by mouth 2 (two) times daily.   loratadine 10 MG tablet Commonly known as: CLARITIN Take 1 tablet (10 mg total) by mouth daily.   naproxen 500 MG tablet Commonly known as: Naprosyn Take 1 tablet (500 mg total) by mouth 2  (two) times daily with a meal.         Objective:   BP 131/89    Pulse 74    Ht 5' (1.524 m)    Wt 132 lb (59.9 kg)    SpO2 99%    BMI 25.78 kg/m   Wt Readings from Last 3 Encounters:  04/11/21 132 lb (59.9 kg)  04/08/21 132 lb (59.9 kg)  02/18/21 133 lb (60.3 kg)    Physical Exam Vitals and nursing note reviewed.  Constitutional:      Appearance: Normal appearance.  Skin:    Findings: Lesion (Wound 0.1 cm x 0.2 cm in size on right inner lower extremity, dry granulation tissue base) present.  Neurological:     Mental Status: She is alert.   Slight debridement with Q-tip to drawing blood supply Nurse to place Unna boot again  Assessment & Plan:   Problem List Items Addressed This Visit   None Visit Diagnoses     Wound of right lower extremity, subsequent encounter    -  Primary        Follow up plan: Return in about 1 week (around 04/18/2021), or if symptoms worsen or fail to improve, for Wound recheck.  Counseling  provided for all of the vaccine components No orders of the defined types were placed in this encounter.   Caryl Pina, MD [facility] 04/11/2021, 3:39 PM

## 2021-04-18 ENCOUNTER — Encounter: Payer: Self-pay | Admitting: Family Medicine

## 2021-04-18 ENCOUNTER — Ambulatory Visit: Payer: Medicare Other | Admitting: Nurse Practitioner

## 2021-04-18 ENCOUNTER — Ambulatory Visit (INDEPENDENT_AMBULATORY_CARE_PROVIDER_SITE_OTHER): Payer: Medicare Other | Admitting: Family Medicine

## 2021-04-18 VITALS — BP 141/65 | HR 66 | Temp 98.0°F | Ht 60.0 in | Wt 132.0 lb

## 2021-04-18 DIAGNOSIS — S81801D Unspecified open wound, right lower leg, subsequent encounter: Secondary | ICD-10-CM | POA: Diagnosis not present

## 2021-04-18 NOTE — Progress Notes (Signed)
Subjective:  Patient ID: [name], female    DOB: [birth year], 87 y.o.   MRN: [id number]  Patient Care Team: Dettinger, Fransisca Kaufmann, MD as PCP - General (Family Medicine)   Chief Complaint:  Wound Check (Right lower leg)   HPI: [name] is a 87 y.o. 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 She has had an [employer] on for the last week and has tolerated well. She denies any systemic signs of infection.   Wound Check She was originally treated 10 to 14 days ago. There has been no drainage from the wound. The redness has improved. The swelling has improved. The pain has improved. She has no difficulty moving the affected extremity or digit.    Relevant past medical, surgical, family, and social history reviewed and updated as indicated.  Allergies and medications reviewed and updated. Data reviewed: Chart in Epic.   Past Medical History:  Diagnosis Date   Arthritis of right foot    Hx: UTI (urinary tract infection)    Hypertension    Palpitations    Unsteady gait     Past Surgical History:  Procedure Laterality Date   CATARACT EXTRACTION W/PHACO Right 01/01/2016   Procedure: CATARACT EXTRACTION PHACO AND INTRAOCULAR LENS PLACEMENT RIGHT EYE;  Surgeon: Rutherford Guys, MD;  Location: [facility];  Service: Ophthalmology;  Laterality: Right;  CDE: 9.69   CATARACT EXTRACTION W/PHACO Left 01/29/2016   Procedure: CATARACT EXTRACTION PHACO AND INTRAOCULAR LENS PLACEMENT (IOC);  Surgeon: Rutherford Guys, MD;  Location: [facility];  Service: Ophthalmology;  Laterality: Left;  CDE: 9.32   None      Social History   Socioeconomic History   Marital status: Married    Spouse name: Not on file   Number of children: 4   Years of education: 4th grade   Highest education level: Not on file  Occupational History   Occupation: Homemaker  Tobacco Use   Smoking status: Never    Smokeless tobacco: Never  Vaping Use   Vaping Use: Never used  Substance and Sexual Activity   Alcohol use: No    Alcohol/week: 0.0 standard drinks   Drug use: Never   Sexual activity: Not on file  Other Topics Concern   Not on file  Social History Narrative   Lives at home with son and husband.     1 cup coffee per day.   Right-handed.   Social Determinants of Health   Financial Resource Strain: Low Risk    Difficulty of Paying Living Expenses: Not hard at all  Food Insecurity: No Food Insecurity   Worried About Charity fundraiser in the Last Year: Never true   [place] in the Last Year: Never true  Transportation Needs: No Transportation Needs   Lack of Transportation (Medical): No   Lack of Transportation (Non-Medical): No  Physical Activity: Insufficiently Active   Days of Exercise per Week: 5 days   Minutes of Exercise per Session: 20 min  Stress: No Stress Concern Present   Feeling of Stress : Not at all  Social Connections: Moderately Isolated   Frequency of Communication with Friends and Family: More than three times a week   Frequency of Social Gatherings with Friends and Family: More than three times a week   Attends Religious Services: Never   Marine scientist or Organizations: No  Attends Archivist Meetings: Never   Marital Status: Married  Human resources officer Violence: Not At Risk   Fear of Current or Ex-Partner: No   Emotionally Abused: No   Physically Abused: No   Sexually Abused: No    Outpatient Encounter Medications as of 04/18/2021  Medication Sig   alendronate (FOSAMAX) 70 MG tablet TAKE 1 TABLET BY MOUTH EVERY 7 (SEVEN) DAYS. TAKE WITH A FULL GLASS OF WATER ON AN EMPTY STOMACH.   amLODipine (NORVASC) 5 MG tablet Take 1 tablet (5 mg total) by mouth daily.   atorvastatin (LIPITOR) 40 MG tablet Take 1 tablet (40 mg total) by mouth at bedtime.   bacitracin-polymyxin b (POLYSPORIN) ophthalmic ointment Place 1 application into  both eyes 2 (two) times daily. apply to eye every 12 hours while awake   doxycycline (VIBRA-TABS) 100 MG tablet Take 1 tablet (100 mg total) by mouth 2 (two) times daily.   loratadine (CLARITIN) 10 MG tablet Take 1 tablet (10 mg total) by mouth daily.   naproxen (NAPROSYN) 500 MG tablet Take 1 tablet (500 mg total) by mouth 2 (two) times daily with a meal.   No facility-administered encounter medications on file as of 04/18/2021.    Allergies  Allergen Reactions   Lisinopril Swelling    Tongue swelling    Review of Systems  Constitutional:  Negative for activity change, appetite change, chills, diaphoresis, fatigue, fever and unexpected weight change.  Respiratory:  Negative for cough and shortness of breath.   Cardiovascular:  Negative for chest pain and palpitations.  Gastrointestinal:  Negative for abdominal pain.  Genitourinary:  Negative for decreased urine volume.  Skin:  Positive for wound.  Neurological:  Negative for weakness.  Psychiatric/Behavioral:  Negative for confusion.   All other systems reviewed and are negative.      Objective:  BP (!) 141/65    Pulse 66    Temp 98 F (36.7 C)    Ht 5' (1.524 m)    Wt 132 lb (59.9 kg)    SpO2 96%    BMI 25.78 kg/m    Wt Readings from Last 3 Encounters:  04/18/21 132 lb (59.9 kg)  04/11/21 132 lb (59.9 kg)  04/08/21 132 lb (59.9 kg)    Physical Exam Vitals and nursing note reviewed.  Constitutional:      General: She is not in acute distress.    Appearance: Normal appearance. She is normal weight. She is not ill-appearing or toxic-appearing.  HENT:     Head: Normocephalic and atraumatic.  Eyes:     Pupils: Pupils are equal, round, and reactive to light.  Cardiovascular:     Rate and Rhythm: Normal rate.     Heart sounds: Normal heart sounds.  Pulmonary:     Effort: Pulmonary effort is normal.     Breath sounds: Normal breath sounds.  Musculoskeletal:     Right lower leg: No edema.     Left lower leg: No edema.   Skin:    General: Skin is warm and dry.     Capillary Refill: Capillary refill takes less than 2 seconds.       Neurological:     General: No focal deficit present.     Mental Status: She is alert and oriented to person, place, and time.       Results for orders placed or performed in visit on 01/24/21  CBC with Differential/Platelet  Result Value Ref Range   WBC 7.4 3.4 - 10.8 x10E3/uL  RBC 4.31 3.77 - 5.28 x10E6/uL   Hemoglobin 12.5 11.1 - 15.9 g/dL   Hematocrit 37.6 34.0 - 46.6 %   MCV 87 79 - 97 fL   MCH 29.0 26.6 - 33.0 pg   MCHC 33.2 31.5 - 35.7 g/dL   RDW 13.9 11.7 - 15.4 %   Platelets 340 150 - 450 x10E3/uL   Neutrophils 66 Not Estab. %   Lymphs 22 Not Estab. %   Monocytes 7 Not Estab. %   Eos 4 Not Estab. %   Basos 1 Not Estab. %   Neutrophils Absolute 4.9 1.4 - 7.0 x10E3/uL   Lymphocytes Absolute 1.7 0.7 - 3.1 x10E3/uL   Monocytes Absolute 0.5 0.1 - 0.9 x10E3/uL   EOS (ABSOLUTE) 0.3 0.0 - 0.4 x10E3/uL   Basophils Absolute 0.1 0.0 - 0.2 x10E3/uL   Immature Granulocytes 0 Not Estab. %   Immature Grans (Abs) 0.0 0.0 - 0.1 x10E3/uL  CMP14+EGFR  Result Value Ref Range   Glucose 90 70 - 99 mg/dL   BUN 9 8 - 27 mg/dL   Creatinine, Ser 0.68 0.57 - 1.00 mg/dL   eGFR 85 >59 mL/min/1.73   BUN/Creatinine Ratio 13 12 - 28   Sodium 140 134 - 144 mmol/L   Potassium 4.5 3.5 - 5.2 mmol/L   Chloride 103 96 - 106 mmol/L   CO2 21 20 - 29 mmol/L   Calcium 8.9 8.7 - 10.3 mg/dL   Total Protein 7.1 6.0 - 8.5 g/dL   Albumin 4.0 3.6 - 4.6 g/dL   Globulin, Total 3.1 1.5 - 4.5 g/dL   Albumin/Globulin Ratio 1.3 1.2 - 2.2   Bilirubin Total 0.3 0.0 - 1.2 mg/dL   Alkaline Phosphatase 75 44 - 121 IU/L   AST 20 0 - 40 IU/L   ALT 10 0 - 32 IU/L  Lipid panel  Result Value Ref Range   Cholesterol, Total 287 (H) 100 - 199 mg/dL   Triglycerides 242 (H) 0 - 149 mg/dL   HDL 35 (L) >39 mg/dL   VLDL Cholesterol Cal 48 (H) 5 - 40 mg/dL   LDL Chol Calc (NIH) 204 (H) 0 - 99 mg/dL    Lipid Comment: Comment    Chol/HDL Ratio 8.2 (H) 0.0 - 4.4 ratio  VITAMIN D 25 Hydroxy (Vit-D Deficiency, Fractures)  Result Value Ref Range   Vit D, 25-Hydroxy 33.1 30.0 - 100.0 ng/mL       Pertinent labs & imaging results that were available during my care of the patient were reviewed by me and considered in my medical decision making.  Assessment & Plan:  Saraia was seen today for wound check.  Diagnoses and all orders for this visit:  Wound of right lower extremity, subsequent encounter Unna boot reapplied in office today. Follow up in one week for reevaluation.     Continue all other maintenance medications.  Follow up plan: Return in about 1 week (around 04/25/2021), or if symptoms worsen or fail to improve, for woound care.   Continue healthy lifestyle choices, including diet (rich in fruits, vegetables, and lean proteins, and low in salt and simple carbohydrates) and exercise (at least 30 minutes of moderate physical activity daily).  Educational handout given for [employer] care.  The above assessment and management plan was discussed with the patient. The patient verbalized understanding of and has agreed to the management plan. Patient is aware to call the clinic if they develop any new symptoms or if symptoms persist or worsen. Patient is  aware when to return to the clinic for a follow-up visit. Patient educated on when it is appropriate to go to the emergency department.   Monia Pouch, FNP-C [facility] [phone]

## 2021-04-25 ENCOUNTER — Ambulatory Visit (INDEPENDENT_AMBULATORY_CARE_PROVIDER_SITE_OTHER): Payer: Commercial Managed Care - HMO | Admitting: Family Medicine

## 2021-04-25 ENCOUNTER — Encounter: Payer: Self-pay | Admitting: Family Medicine

## 2021-04-25 VITALS — BP 155/67 | HR 88 | Temp 97.9°F | Ht 60.0 in | Wt 132.0 lb

## 2021-04-25 DIAGNOSIS — S81801D Unspecified open wound, right lower leg, subsequent encounter: Secondary | ICD-10-CM | POA: Diagnosis not present

## 2021-04-25 NOTE — Progress Notes (Signed)
Subjective:  Patient ID: [name], female    DOB: Jan 24, 1933, 88 y.o.   MRN: [id number]  Patient Care Team: Dettinger, Fransisca Kaufmann, MD as PCP - General (Family Medicine)   Chief Complaint:  Wound Check   HPI: [name] is a 88 y.o. female presenting on 04/25/2021 for Wound Check   Patient presents today for recheck of arterial wound to right lower medial leg.  She was initially seen by Dr. Warrick Parisian on 04/08/2021 and was placed in [employer].  She had subsequent visits on 12/22 and 12/29 in which a new boots were placed.  She denies any pain at wound site.  No signs of systemic infection.  No loss of function.  Wound Check She was originally treated more than 14 days ago. Previous treatment included wound cleansing or irrigation. There has been no drainage from the wound. The redness has improved. The swelling has improved. There is no pain present. She has no difficulty moving the affected extremity or digit.     Relevant past medical, surgical, family, and social history reviewed and updated as indicated.  Allergies and medications reviewed and updated. Data reviewed: Chart in Epic.   Past Medical History:  Diagnosis Date   Arthritis of right foot    Hx: UTI (urinary tract infection)    Hypertension    Palpitations    Unsteady gait     Past Surgical History:  Procedure Laterality Date   CATARACT EXTRACTION W/PHACO Right 01/01/2016   Procedure: CATARACT EXTRACTION PHACO AND INTRAOCULAR LENS PLACEMENT RIGHT EYE;  Surgeon: Rutherford Guys, MD;  Location: [facility];  Service: Ophthalmology;  Laterality: Right;  CDE: 9.69   CATARACT EXTRACTION W/PHACO Left 01/29/2016   Procedure: CATARACT EXTRACTION PHACO AND INTRAOCULAR LENS PLACEMENT (IOC);  Surgeon: Rutherford Guys, MD;  Location: [facility];  Service: Ophthalmology;  Laterality: Left;  CDE: 9.32   None      Social History   Socioeconomic History   Marital status: Married    Spouse name: Not on file   Number of children: 4    Years of education: 4th grade   Highest education level: Not on file  Occupational History   Occupation: Homemaker  Tobacco Use   Smoking status: Never   Smokeless tobacco: Never  Vaping Use   Vaping Use: Never used  Substance and Sexual Activity   Alcohol use: No    Alcohol/week: 0.0 standard drinks   Drug use: Never   Sexual activity: Not on file  Other Topics Concern   Not on file  Social History Narrative   Lives at home with son and husband.     1 cup coffee per day.   Right-handed.   Social Determinants of Health   Financial Resource Strain: Low Risk    Difficulty of Paying Living Expenses: Not hard at all  Food Insecurity: No Food Insecurity   Worried About Charity fundraiser in the Last Year: Never true   [place] in the Last Year: Never true  Transportation Needs: No Transportation Needs   Lack of Transportation (Medical): No   Lack of Transportation (Non-Medical): No  Physical Activity: Insufficiently Active   Days of Exercise per Week: 5 days   Minutes of Exercise per Session: 20 min  Stress: No Stress Concern Present   Feeling of Stress : Not at all  Social Connections: Moderately Isolated   Frequency of Communication with Friends and Family: More than three times a week  Frequency of Social Gatherings with Friends and Family: More than three times a week   Attends Religious Services: Never   Marine scientist or Organizations: No   Attends Archivist Meetings: Never   Marital Status: Married  Human resources officer Violence: Not At Risk   Fear of Current or Ex-Partner: No   Emotionally Abused: No   Physically Abused: No   Sexually Abused: No    Outpatient Encounter Medications as of 04/25/2021  Medication Sig   alendronate (FOSAMAX) 70 MG tablet TAKE 1 TABLET BY MOUTH EVERY 7 (SEVEN) DAYS. TAKE WITH A FULL GLASS OF WATER ON AN EMPTY STOMACH.   amLODipine (NORVASC) 5 MG tablet Take 1 tablet (5 mg total) by mouth daily.    atorvastatin (LIPITOR) 40 MG tablet Take 1 tablet (40 mg total) by mouth at bedtime.   bacitracin-polymyxin b (POLYSPORIN) ophthalmic ointment Place 1 application into both eyes 2 (two) times daily. apply to eye every 12 hours while awake   doxycycline (VIBRA-TABS) 100 MG tablet Take 1 tablet (100 mg total) by mouth 2 (two) times daily.   loratadine (CLARITIN) 10 MG tablet Take 1 tablet (10 mg total) by mouth daily.   naproxen (NAPROSYN) 500 MG tablet Take 1 tablet (500 mg total) by mouth 2 (two) times daily with a meal.   No facility-administered encounter medications on file as of 04/25/2021.    Allergies  Allergen Reactions   Lisinopril Swelling    Tongue swelling    Review of Systems  Constitutional:  Negative for activity change, appetite change, chills, diaphoresis, fatigue, fever and unexpected weight change.  Respiratory:  Negative for cough and shortness of breath.   Cardiovascular:  Negative for chest pain, palpitations and leg swelling.  Genitourinary:  Negative for decreased urine volume.  Skin:  Positive for wound.  Neurological:  Negative for dizziness, tremors, seizures, syncope, facial asymmetry, speech difficulty, weakness, light-headedness, numbness and headaches.  Psychiatric/Behavioral:  Negative for confusion.   All other systems reviewed and are negative.      Objective:  Ht 5' (1.524 m)    Wt 132 lb (59.9 kg)    BMI 25.78 kg/m    Wt Readings from Last 3 Encounters:  04/25/21 132 lb (59.9 kg)  04/18/21 132 lb (59.9 kg)  04/11/21 132 lb (59.9 kg)    Physical Exam Vitals and nursing note reviewed.  Constitutional:      General: She is not in acute distress.    Appearance: Normal appearance. She is normal weight. She is not ill-appearing, toxic-appearing or diaphoretic.  HENT:     Head: Normocephalic and atraumatic.  Eyes:     Pupils: Pupils are equal, round, and reactive to light.  Cardiovascular:     Rate and Rhythm: Normal rate and regular rhythm.      Pulses: Normal pulses.     Heart sounds: Normal heart sounds.  Pulmonary:     Effort: Pulmonary effort is normal.     Breath sounds: Normal breath sounds.  Musculoskeletal:     Right lower leg: No edema.     Left lower leg: No edema.  Skin:    General: Skin is warm and dry.     Capillary Refill: Capillary refill takes less than 2 seconds.       Neurological:     General: No focal deficit present.     Mental Status: She is alert and oriented to person, place, and time.     Gait: Gait abnormal (using walker).  Psychiatric:        Mood and Affect: Mood normal.        Behavior: Behavior normal.        Thought Content: Thought content normal.        Judgment: Judgment normal.    Results for orders placed or performed in visit on 01/24/21  CBC with Differential/Platelet  Result Value Ref Range   WBC 7.4 3.4 - 10.8 x10E3/uL   RBC 4.31 3.77 - 5.28 x10E6/uL   Hemoglobin 12.5 11.1 - 15.9 g/dL   Hematocrit 37.6 34.0 - 46.6 %   MCV 87 79 - 97 fL   MCH 29.0 26.6 - 33.0 pg   MCHC 33.2 31.5 - 35.7 g/dL   RDW 13.9 11.7 - 15.4 %   Platelets 340 150 - 450 x10E3/uL   Neutrophils 66 Not Estab. %   Lymphs 22 Not Estab. %   Monocytes 7 Not Estab. %   Eos 4 Not Estab. %   Basos 1 Not Estab. %   Neutrophils Absolute 4.9 1.4 - 7.0 x10E3/uL   Lymphocytes Absolute 1.7 0.7 - 3.1 x10E3/uL   Monocytes Absolute 0.5 0.1 - 0.9 x10E3/uL   EOS (ABSOLUTE) 0.3 0.0 - 0.4 x10E3/uL   Basophils Absolute 0.1 0.0 - 0.2 x10E3/uL   Immature Granulocytes 0 Not Estab. %   Immature Grans (Abs) 0.0 0.0 - 0.1 x10E3/uL  CMP14+EGFR  Result Value Ref Range   Glucose 90 70 - 99 mg/dL   BUN 9 8 - 27 mg/dL   Creatinine, Ser 0.68 0.57 - 1.00 mg/dL   eGFR 85 >59 mL/min/1.73   BUN/Creatinine Ratio 13 12 - 28   Sodium 140 134 - 144 mmol/L   Potassium 4.5 3.5 - 5.2 mmol/L   Chloride 103 96 - 106 mmol/L   CO2 21 20 - 29 mmol/L   Calcium 8.9 8.7 - 10.3 mg/dL   Total Protein 7.1 6.0 - 8.5 g/dL   Albumin 4.0 3.6 - 4.6  g/dL   Globulin, Total 3.1 1.5 - 4.5 g/dL   Albumin/Globulin Ratio 1.3 1.2 - 2.2   Bilirubin Total 0.3 0.0 - 1.2 mg/dL   Alkaline Phosphatase 75 44 - 121 IU/L   AST 20 0 - 40 IU/L   ALT 10 0 - 32 IU/L  Lipid panel  Result Value Ref Range   Cholesterol, Total 287 (H) 100 - 199 mg/dL   Triglycerides 242 (H) 0 - 149 mg/dL   HDL 35 (L) >39 mg/dL   VLDL Cholesterol Cal 48 (H) 5 - 40 mg/dL   LDL Chol Calc (NIH) 204 (H) 0 - 99 mg/dL   Lipid Comment: Comment    Chol/HDL Ratio 8.2 (H) 0.0 - 4.4 ratio  VITAMIN D 25 Hydroxy (Vit-D Deficiency, Fractures)  Result Value Ref Range   Vit D, 25-Hydroxy 33.1 30.0 - 100.0 ng/mL         Pertinent labs & imaging results that were available during my care of the patient were reviewed by me and considered in my medical decision making.  Assessment & Plan:  [name] was seen today for wound check.  Diagnoses and all orders for this visit:  Wound of right lower extremity, subsequent encounter Wound healing well in office. Wound cleansed and redressed with xeroform to wound bed covered by gauze and wrapped with ace. Wound care discussed in detail. Follow up in one week for reevaluation.     Continue all other maintenance medications.  Follow up plan: Return in about 1 week (  around 05/02/2021), or if symptoms worsen or fail to improve, for wound recheck - Dettinger.   Continue healthy lifestyle choices, including diet (rich in fruits, vegetables, and lean proteins, and low in salt and simple carbohydrates) and exercise (at least 30 minutes of moderate physical activity daily).  Educational handout given for wound care  The above assessment and management plan was discussed with the patient. The patient verbalized understanding of and has agreed to the management plan. Patient is aware to call the clinic if they develop any new symptoms or if symptoms persist or worsen. Patient is aware when to return to the clinic for a follow-up visit. Patient  educated on when it is appropriate to go to the emergency department.   [name] Pouch, FNP-C [facility] [phone]

## 2021-05-02 ENCOUNTER — Ambulatory Visit: Payer: Medicare Other | Admitting: Family Medicine

## 2021-05-03 ENCOUNTER — Encounter: Payer: Self-pay | Admitting: Family Medicine

## 2021-05-03 ENCOUNTER — Ambulatory Visit (INDEPENDENT_AMBULATORY_CARE_PROVIDER_SITE_OTHER): Payer: Commercial Managed Care - HMO | Admitting: Family Medicine

## 2021-05-03 ENCOUNTER — Ambulatory Visit: Payer: Commercial Managed Care - HMO | Admitting: Family Medicine

## 2021-05-03 VITALS — BP 131/63 | HR 66 | Ht 60.0 in | Wt 132.0 lb

## 2021-05-03 DIAGNOSIS — S81801D Unspecified open wound, right lower leg, subsequent encounter: Secondary | ICD-10-CM

## 2021-05-03 NOTE — Progress Notes (Signed)
BP 131/63    Pulse 66    SpO2 95%    Subjective:   Patient ID: [name], female    DOB: October 30, 1932, 88 y.o.   MRN: [id number]  HPI: [name] is a 88 y.o. female presenting on 05/03/2021 for No chief complaint on file.   HPI Wound care recheck Patient is coming in today for wound care recheck.  She has been doing Xeroform gauze and then wrapped it with rolled gauze.  This is the fifth week that been doing and does seem to be improving and is more superficial.  She denies any redness or warmth.  She did finish the doxycycline.  She has not taken anything further.  Relevant past medical, surgical, family and social history reviewed and updated as indicated. Interim medical history since our last visit reviewed. Allergies and medications reviewed and updated.  Review of Systems  Constitutional:  Negative for chills and fever.  Eyes:  Negative for visual disturbance.  Respiratory:  Negative for chest tightness and shortness of breath.   Cardiovascular:  Negative for chest pain and leg swelling.  Musculoskeletal:  Negative for back pain and gait problem.  Skin:  Positive for wound. Negative for color change and rash.  Neurological:  Negative for light-headedness and headaches.  Psychiatric/Behavioral:  Negative for agitation and behavioral problems.   All other systems reviewed and are negative.  Per HPI unless specifically indicated above   Allergies as of 05/03/2021       Reactions   Lisinopril Swelling   Tongue swelling        Medication List        Accurate as of May 03, 2021  1:35 PM. If you have any questions, ask your nurse or doctor.          alendronate 70 MG tablet Commonly known as: FOSAMAX TAKE 1 TABLET BY MOUTH EVERY 7 (SEVEN) DAYS. TAKE WITH A FULL GLASS OF WATER ON AN EMPTY STOMACH.   amLODipine 5 MG tablet Commonly known as: NORVASC Take 1 tablet (5 mg total) by mouth daily.   atorvastatin 40 MG tablet Commonly known as:  LIPITOR Take 1 tablet (40 mg total) by mouth at bedtime.   bacitracin-polymyxin b ophthalmic ointment Commonly known as: POLYSPORIN Place 1 application into both eyes 2 (two) times daily. apply to eye every 12 hours while awake   doxycycline 100 MG tablet Commonly known as: VIBRA-TABS Take 1 tablet (100 mg total) by mouth 2 (two) times daily.   loratadine 10 MG tablet Commonly known as: CLARITIN Take 1 tablet (10 mg total) by mouth daily.   naproxen 500 MG tablet Commonly known as: Naprosyn Take 1 tablet (500 mg total) by mouth 2 (two) times daily with a meal.         Objective:   BP 131/63    Pulse 66    SpO2 95%   Wt Readings from Last 3 Encounters:  04/25/21 132 lb (59.9 kg)  04/18/21 132 lb (59.9 kg)  04/11/21 132 lb (59.9 kg)    Physical Exam Vitals and nursing note reviewed.  Constitutional:      General: She is not in acute distress.    Appearance: She is well-developed. She is not diaphoretic.  Eyes:     Conjunctiva/sclera: Conjunctivae normal.  Skin:    General: Skin is warm and dry.     Findings: Wound present. No rash.  Neurological:     Mental Status: She is alert and oriented to person,  place, and time.     Coordination: Coordination normal.  Psychiatric:        Behavior: Behavior normal.    Re wrap and come back in one week, continue with Xeroform gauze and roll gauze and [place].  Seems to be helping.  Assessment & Plan:   Problem List Items Addressed This Visit   None Visit Diagnoses     Wound of right lower extremity, subsequent encounter    -  Primary       Much improved, almost healed completely, will do 1 more week and likely begin from there. Follow up plan: Return in about 1 week (around 05/10/2021), or if symptoms worsen or fail to improve, for Wound care recheck.  Counseling provided for all of the vaccine components No orders of the defined types were placed in this encounter.   Caryl Pina, MD [facility] 05/03/2021, 1:35 PM

## 2021-05-09 ENCOUNTER — Encounter: Payer: Self-pay | Admitting: Family Medicine

## 2021-05-09 ENCOUNTER — Ambulatory Visit (INDEPENDENT_AMBULATORY_CARE_PROVIDER_SITE_OTHER): Payer: Commercial Managed Care - HMO | Admitting: Family Medicine

## 2021-05-09 VITALS — BP 147/64 | HR 61 | Ht 60.0 in | Wt 133.0 lb

## 2021-05-09 DIAGNOSIS — S81801D Unspecified open wound, right lower leg, subsequent encounter: Secondary | ICD-10-CM | POA: Diagnosis not present

## 2021-05-09 NOTE — Progress Notes (Signed)
BP (!) 147/64    Pulse 61    Ht 5' (1.524 m)    Wt 133 lb (60.3 kg)    SpO2 94%    BMI 25.97 kg/m    Subjective:   Patient ID: [name], female    DOB: 07/27/1932, 88 y.o.   MRN: [id number]  HPI: [name] is a 88 y.o. female presenting on 05/09/2021 for Wound Check (LLE)   HPI Recheck wound Patient is coming in for a wound recheck today and has been doing wraps and Xeroform and she feels like her leg is just drying out.  She denies any fevers or chills or redness or warmth.  She denies any pain in her leg.  She feels like the wound is getting there and wants to know if she should do something different.  Relevant past medical, surgical, family and social history reviewed and updated as indicated. Interim medical history since our last visit reviewed. Allergies and medications reviewed and updated.  Review of Systems  Skin:  Positive for wound. Negative for color change.   Per HPI unless specifically indicated above   Allergies as of 05/09/2021       Reactions   Lisinopril Swelling   Tongue swelling        Medication List        Accurate as of May 09, 2021 10:29 AM. If you have any questions, ask your nurse or doctor.          alendronate 70 MG tablet Commonly known as: FOSAMAX TAKE 1 TABLET BY MOUTH EVERY 7 (SEVEN) DAYS. TAKE WITH A FULL GLASS OF WATER ON AN EMPTY STOMACH.   amLODipine 5 MG tablet Commonly known as: NORVASC Take 1 tablet (5 mg total) by mouth daily.   atorvastatin 40 MG tablet Commonly known as: LIPITOR Take 1 tablet (40 mg total) by mouth at bedtime.   bacitracin-polymyxin b ophthalmic ointment Commonly known as: POLYSPORIN Place 1 application into both eyes 2 (two) times daily. apply to eye every 12 hours while awake   doxycycline 100 MG tablet Commonly known as: VIBRA-TABS Take 1 tablet (100 mg total) by mouth 2 (two) times daily.   loratadine 10 MG tablet Commonly known as: CLARITIN Take 1 tablet (10 mg total) by  mouth daily.   naproxen 500 MG tablet Commonly known as: Naprosyn Take 1 tablet (500 mg total) by mouth 2 (two) times daily with a meal.         Objective:   BP (!) 147/64    Pulse 61    Ht 5' (1.524 m)    Wt 133 lb (60.3 kg)    SpO2 94%    BMI 25.97 kg/m   Wt Readings from Last 3 Encounters:  05/09/21 133 lb (60.3 kg)  05/03/21 132 lb (59.9 kg)  04/25/21 132 lb (59.9 kg)    Physical Exam Vitals and nursing note reviewed.  Constitutional:      General: She is not in acute distress.    Appearance: She is well-developed. She is not diaphoretic.  Eyes:     Conjunctiva/sclera: Conjunctivae normal.  Skin:    General: Skin is warm and dry.     Findings: Wound (Small wound appears to be dried and have a good base, will leave open at this point.) present. No rash.  Neurological:     Mental Status: She is alert and oriented to person, place, and time.     Coordination: Coordination normal.  Psychiatric:  Behavior: Behavior normal.      Assessment & Plan:   Problem List Items Addressed This Visit   None Visit Diagnoses     Wound of right lower extremity, subsequent encounter    -  Primary       Wound looks to be much improved, it does appear that her leg is drying out, recommended we leave it open and she is just going to apply a moisturizer at least twice a day. Follow up plan: Return if symptoms worsen or fail to improve.  Counseling provided for all of the vaccine components No orders of the defined types were placed in this encounter.   Caryl Pina, MD [facility] 05/09/2021, 10:29 AM

## 2021-06-14 ENCOUNTER — Telehealth: Payer: Self-pay | Admitting: Family Medicine

## 2021-06-14 DIAGNOSIS — S91302A Unspecified open wound, left foot, initial encounter: Secondary | ICD-10-CM | POA: Diagnosis not present

## 2021-06-14 DIAGNOSIS — L03311 Cellulitis of abdominal wall: Secondary | ICD-10-CM | POA: Diagnosis not present

## 2021-06-14 NOTE — Telephone Encounter (Signed)
Made an appt for 2/27 at 4:10

## 2021-06-14 NOTE — Telephone Encounter (Signed)
Unfortunately I just saw this now and do not have time to work her in, if she continues to feel bad she may have to go to the urgent care over the weekend because today is Friday

## 2021-06-14 NOTE — Telephone Encounter (Signed)
Spoke with daughter, they are at Urgent care now

## 2021-06-17 ENCOUNTER — Ambulatory Visit (INDEPENDENT_AMBULATORY_CARE_PROVIDER_SITE_OTHER): Payer: Medicare Other | Admitting: Family Medicine

## 2021-06-17 ENCOUNTER — Encounter: Payer: Self-pay | Admitting: Family Medicine

## 2021-06-17 VITALS — BP 138/69 | HR 90 | Ht 60.0 in | Wt 129.0 lb

## 2021-06-17 DIAGNOSIS — L89892 Pressure ulcer of other site, stage 2: Secondary | ICD-10-CM

## 2021-06-17 MED ORDER — CEFTRIAXONE SODIUM 1 G IJ SOLR
1.0000 g | Freq: Once | INTRAMUSCULAR | Status: AC
  Administered 2021-06-17: 1 g via INTRAMUSCULAR

## 2021-06-17 NOTE — Progress Notes (Signed)
BP 138/69    Pulse 90    Ht 5' (1.524 m)    Wt 129 lb (58.5 kg)    SpO2 97%    BMI 25.19 kg/m    Subjective:   Patient ID: [name], female    DOB: 1933-02-07, 88 y.o.   MRN: [id number]  HPI: [name] is a 88 y.o. female presenting on 06/17/2021 for wound (Left great toe) and Abdominal Pain (LLQ)   HPI Left great toe wound Patient has a wound on the inner aspect of her foot near the base of her left great toe where she has a large bunion.  She says it rubbed and put pressure on by one of her shoes and then opened up.  She did end up seeing an urgent care 3 days ago and started on antibiotic Bactrim 2 days ago for it.  She does have a lot of redness and pain still around.  She is not applying any topical dressing on it.  She denies any fever chills.  Patient has a rash on her lower abdomen that is just red and sore and itchy but no pain or purulence or drainage.  It looks very dry in nature.  She is taking some moisturizer on it but not significantly, just every now and then.  Relevant past medical, surgical, family and social history reviewed and updated as indicated. Interim medical history since our last visit reviewed. Allergies and medications reviewed and updated.  Review of Systems  Constitutional:  Negative for chills and fever.  Eyes:  Negative for visual disturbance.  Respiratory:  Negative for chest tightness and shortness of breath.   Cardiovascular:  Negative for chest pain and leg swelling.  Musculoskeletal:  Negative for back pain and gait problem.  Skin:  Positive for color change, rash and wound.  Neurological:  Negative for light-headedness and headaches.  Psychiatric/Behavioral:  Negative for agitation and behavioral problems.   All other systems reviewed and are negative.  Per HPI unless specifically indicated above   Allergies as of 06/17/2021       Reactions   Lisinopril Swelling   Tongue swelling        Medication List        Accurate as  of June 17, 2021  4:42 PM. If you have any questions, ask your nurse or doctor.          alendronate 70 MG tablet Commonly known as: FOSAMAX TAKE 1 TABLET BY MOUTH EVERY 7 (SEVEN) DAYS. TAKE WITH A FULL GLASS OF WATER ON AN EMPTY STOMACH.   amLODipine 5 MG tablet Commonly known as: NORVASC Take 1 tablet (5 mg total) by mouth daily.   atorvastatin 40 MG tablet Commonly known as: LIPITOR Take 1 tablet (40 mg total) by mouth at bedtime.   bacitracin-polymyxin b ophthalmic ointment Commonly known as: POLYSPORIN Place 1 application into both eyes 2 (two) times daily. apply to eye every 12 hours while awake   doxycycline 100 MG tablet Commonly known as: VIBRA-TABS Take 1 tablet (100 mg total) by mouth 2 (two) times daily.   loratadine 10 MG tablet Commonly known as: CLARITIN Take 1 tablet (10 mg total) by mouth daily.   naproxen 500 MG tablet Commonly known as: Naprosyn Take 1 tablet (500 mg total) by mouth 2 (two) times daily with a meal.         Objective:   BP 138/69    Pulse 90    Ht 5' (1.524 m)  Wt 129 lb (58.5 kg)    SpO2 97%    BMI 25.19 kg/m   Wt Readings from Last 3 Encounters:  06/17/21 129 lb (58.5 kg)  05/09/21 133 lb (60.3 kg)  05/03/21 132 lb (59.9 kg)    Physical Exam Vitals and nursing note reviewed.  Constitutional:      General: She is not in acute distress.    Appearance: She is well-developed. She is not diaphoretic.  Eyes:     Conjunctiva/sclera: Conjunctivae normal.  Musculoskeletal:        General: No tenderness. Normal range of motion.  Skin:    General: Skin is warm and dry.     Findings: No rash.  Neurological:     Mental Status: She is alert and oriented to person, place, and time.     Coordination: Coordination normal.  Psychiatric:        Behavior: Behavior normal.     Instructed patient to do dressing Xeroform gauze and then wrapped and replace dressing every day and return in 1 week Assessment & Plan:   Problem  List Items Addressed This Visit   None Visit Diagnoses     Pressure injury of left foot, stage 2 (HCC)    -  Primary   Relevant Medications   cefTRIAXone (ROCEPHIN) injection 1 g (Start on 06/17/2021  4:45 PM)       Rash on abdomen is just dry skin, recommended moisturizer.  Follow up plan: Return in about 1 week (around 06/24/2021), or if symptoms worsen or fail to improve, for Wound recheck.  Counseling provided for all of the vaccine components No orders of the defined types were placed in this encounter.   Caryl Pina, MD [facility] 06/17/2021, 4:42 PM

## 2021-06-18 ENCOUNTER — Telehealth: Payer: Self-pay | Admitting: Family Medicine

## 2021-06-18 NOTE — Telephone Encounter (Signed)
Seen Dettinger 02/27 for pressure wound. Treatment Instructed patient to do dressing Xeroform gauze and then wrapped and replace dressing every day and return in 1 week. Please advise.  Covering pcp

## 2021-06-18 NOTE — Telephone Encounter (Signed)
Since Dettinger has evaluated and treated pt for this, he can address when he gets back tomorrow. She can take Tylenol 650 mg.

## 2021-06-18 NOTE — Telephone Encounter (Signed)
Pts daughter called stating that pt had an appt with Dr Dettinger yesterday and did speak with him about a blister/infection on pts foot.  Wants to know if Dr Dettinger can send medicine in to the pharmacy for pt because she says pt was up all night last night crying about how much pain she had in her foot.  Please advise and call daughter, Jana Half.

## 2021-06-18 NOTE — Telephone Encounter (Signed)
Informed daughter that Dettinger is out of the office but will address with him tomorrow.   Advised daughter to continue with Tylenol 650 every 6-8 hours. Keep foot elevated. May use a warm compress as needed. Informed to also change dressing daily. Daughter will re wrap it now.

## 2021-06-19 NOTE — Telephone Encounter (Signed)
Daughter has been informed and understands ?

## 2021-06-19 NOTE — Telephone Encounter (Signed)
Yes I agree at this point I would take the Tylenol and can be taken 3 times a day and keep elevated and if worsens then let us know. ?

## 2021-06-26 ENCOUNTER — Encounter: Payer: Self-pay | Admitting: Family Medicine

## 2021-06-26 ENCOUNTER — Ambulatory Visit (INDEPENDENT_AMBULATORY_CARE_PROVIDER_SITE_OTHER): Payer: Medicare Other | Admitting: Family Medicine

## 2021-06-26 VITALS — BP 134/75 | HR 64 | Wt 128.0 lb

## 2021-06-26 DIAGNOSIS — L89892 Pressure ulcer of other site, stage 2: Secondary | ICD-10-CM | POA: Diagnosis not present

## 2021-06-26 NOTE — Progress Notes (Signed)
? ?BP 134/75   Pulse 64   Wt 128 lb (58.1 kg)   SpO2 95%   BMI 25.00 kg/m?   ? ?Subjective:  ? ?Patient ID: [name], female    DOB: [birth year], 88 y.o.   MRN: [id number] ? ?HPI: ?[name] is a 88 y.o. female presenting on 06/26/2021 for No chief complaint on file. ? ? ?HPI ?Pressure wound recheck ?Patient is coming in today for pressure wound recheck.  The wound is on the inside of her left foot overlying a bunion just proximal to the first toe.  She has been doing Xeroform gauze and changing it twice a day and wrapping it and trying to keep it elevated.  The color has come back to normal.  She is not having any fevers or chills or drainage. ? ?Relevant past medical, surgical, family and social history reviewed and updated as indicated. Interim medical history since our last visit reviewed. ?Allergies and medications reviewed and updated. ? ?Review of Systems  ?Constitutional:  Negative for chills and fever.  ?Eyes:  Negative for visual disturbance.  ?Respiratory:  Negative for chest tightness and shortness of breath.   ?Cardiovascular:  Negative for chest pain and leg swelling.  ?Musculoskeletal:  Negative for back pain and gait problem.  ?Skin:  Positive for wound. Negative for color change and rash.  ?Neurological:  Negative for light-headedness and headaches.  ?Psychiatric/Behavioral:  Negative for agitation and behavioral problems.   ?All other systems reviewed and are negative. ? ?Per HPI unless specifically indicated above ? ? ?Allergies as of 06/26/2021   ? ?   Reactions  ? Lisinopril Swelling  ? Tongue swelling  ? ?  ? ?  ?Medication List  ?  ? ?  ? Accurate as of June 26, 2021  9:42 AM. If you have any questions, ask your nurse or doctor.  ?  ?  ? ?  ? ?alendronate 70 MG tablet ?Commonly known as: FOSAMAX ?TAKE 1 TABLET BY MOUTH EVERY 7 (SEVEN) DAYS. TAKE WITH A FULL GLASS OF WATER ON AN EMPTY STOMACH. ?  ?amLODipine 5 MG tablet ?Commonly known as: NORVASC ?Take 1 tablet (5 mg total) by mouth  daily. ?  ?atorvastatin 40 MG tablet ?Commonly known as: LIPITOR ?Take 1 tablet (40 mg total) by mouth at bedtime. ?  ?bacitracin-polymyxin b ophthalmic ointment ?Commonly known as: POLYSPORIN ?Place 1 application into both eyes 2 (two) times daily. apply to eye every 12 hours while awake ?  ?doxycycline 100 MG tablet ?Commonly known as: VIBRA-TABS ?Take 1 tablet (100 mg total) by mouth 2 (two) times daily. ?  ?loratadine 10 MG tablet ?Commonly known as: CLARITIN ?Take 1 tablet (10 mg total) by mouth daily. ?  ?naproxen 500 MG tablet ?Commonly known as: Naprosyn ?Take 1 tablet (500 mg total) by mouth 2 (two) times daily with a meal. ?  ? ?  ? ? ? ?Objective:  ? ?BP 134/75   Pulse 64   Wt 128 lb (58.1 kg)   SpO2 95%   BMI 25.00 kg/m?   ?Wt Readings from Last 3 Encounters:  ?06/26/21 128 lb (58.1 kg)  ?06/17/21 129 lb (58.5 kg)  ?05/09/21 133 lb (60.3 kg)  ?  ?Physical Exam ?Vitals and nursing note reviewed.  ?Constitutional:   ?   Appearance: Normal appearance.  ?Musculoskeletal:  ?     Feet: ? ?Neurological:  ?   Mental Status: She is alert.  ? ? ?Perform simple debridement with forceps and iris scissors  and able to remove some of the dry dead skin in the center, applied Xeroform gauze and wrapped and patient instructed to change twice a day.  May apply Neosporin to if they would like ? ?Assessment & Plan:  ? ?Problem List Items Addressed This Visit   ?None ?Visit Diagnoses   ? ? Pressure injury of left foot, stage 2 ([place])    -  Primary  ? ?  ?  ? ?Follow up plan: ?Return in about 1 week (around 07/03/2021), or if symptoms worsen or fail to improve, for Wound recheck. ? ?Counseling provided for all of the vaccine components ?No orders of the defined types were placed in this encounter. ? ? ?Caryl Pina, MD ?[facility] ?06/26/2021, 9:42 AM ? ? ? ? ?

## 2021-07-03 ENCOUNTER — Encounter: Payer: Self-pay | Admitting: Family Medicine

## 2021-07-03 ENCOUNTER — Ambulatory Visit (INDEPENDENT_AMBULATORY_CARE_PROVIDER_SITE_OTHER): Payer: Medicare Other | Admitting: Family Medicine

## 2021-07-03 DIAGNOSIS — S81801D Unspecified open wound, right lower leg, subsequent encounter: Secondary | ICD-10-CM

## 2021-07-03 MED ORDER — NAPROXEN 500 MG PO TABS: 500.0000 mg | ORAL_TABLET | Freq: Two times a day (BID) | ORAL | 1 refills | Status: DC

## 2021-07-03 NOTE — Progress Notes (Signed)
? ?BP (!) 147/76   Pulse 71   Ht 5' (1.524 m)   Wt 129 lb (58.5 kg)   SpO2 98%   BMI 25.19 kg/m?   ? ?Subjective:  ? ?Patient ID: [name], female    DOB: 12-09-1932, 88 y.o.   MRN: [id number] ? ?HPI: ?Hazelgrace Bonham is a 88 y.o. female presenting on 07/03/2021 for Medical Management of Chronic Issues and Pressure injury (Left foot- wrap) ? ? ?HPI ?Left foot wound recheck ?Patient is coming in for left foot wound recheck.  She continues to change dressings twice a day with Xeroform, she also wraps with Ace bandages for compression.  She has been trying to wear better shoes.  Looks like it is drying out again.  She still having some pain with it but its been about the same not worse ? ?Relevant past medical, surgical, family and social history reviewed and updated as indicated. Interim medical history since our last visit reviewed. ?Allergies and medications reviewed and updated. ? ?Review of Systems  ?Constitutional:  Negative for chills and fever.  ?Eyes:  Negative for visual disturbance.  ?Respiratory:  Negative for chest tightness and shortness of breath.   ?Cardiovascular:  Negative for chest pain and leg swelling.  ?Skin:  Positive for wound. Negative for color change and rash.  ?Neurological:  Negative for light-headedness and headaches.  ?Psychiatric/Behavioral:  Negative for agitation and behavioral problems.   ?All other systems reviewed and are negative. ? ?Per HPI unless specifically indicated above ? ? ?Allergies as of 07/03/2021   ? ?   Reactions  ? Lisinopril Swelling  ? Tongue swelling  ? ?  ? ?  ?Medication List  ?  ? ?  ? Accurate as of July 03, 2021  9:41 AM. If you have any questions, ask your nurse or doctor.  ?  ?  ? ?  ? ?STOP taking these medications   ? ?doxycycline 100 MG tablet ?Commonly known as: VIBRA-TABS ?Stopped by: Worthy Rancher, MD ?  ? ?  ? ?TAKE these medications   ? ?alendronate 70 MG tablet ?Commonly known as: FOSAMAX ?TAKE 1 TABLET BY MOUTH EVERY 7 (SEVEN) DAYS.  TAKE WITH A FULL GLASS OF WATER ON AN EMPTY STOMACH. ?  ?amLODipine 5 MG tablet ?Commonly known as: NORVASC ?Take 1 tablet (5 mg total) by mouth daily. ?  ?atorvastatin 40 MG tablet ?Commonly known as: LIPITOR ?Take 1 tablet (40 mg total) by mouth at bedtime. ?  ?bacitracin-polymyxin b ophthalmic ointment ?Commonly known as: POLYSPORIN ?Place 1 application into both eyes 2 (two) times daily. apply to eye every 12 hours while awake ?  ?loratadine 10 MG tablet ?Commonly known as: CLARITIN ?Take 1 tablet (10 mg total) by mouth daily. ?  ?naproxen 500 MG tablet ?Commonly known as: Naprosyn ?Take 1 tablet (500 mg total) by mouth 2 (two) times daily with a meal. ?  ? ?  ? ? ? ?Objective:  ? ?BP (!) 147/76   Pulse 71   Ht 5' (1.524 m)   Wt 129 lb (58.5 kg)   SpO2 98%   BMI 25.19 kg/m?   ?Wt Readings from Last 3 Encounters:  ?07/03/21 129 lb (58.5 kg)  ?06/26/21 128 lb (58.1 kg)  ?06/17/21 129 lb (58.5 kg)  ?  ?Physical Exam ?Vitals and nursing note reviewed.  ?Constitutional:   ?   Appearance: Normal appearance.  ?Skin: ?   Findings: Wound (Wound is about the same size, does appear to be healing  in from the edges slightly and looking better around the edges but does have a very dry center.) present.  ?Neurological:  ?   Mental Status: She is alert.  ? ? ?Apply a small amount of Vaseline along with Xeroform gauze and then wrap.  Change twice daily. ? ?Assessment & Plan:  ? ?Problem List Items Addressed This Visit   ?None ?Visit Diagnoses   ? ? Wound of right lower extremity, subsequent encounter      ? Relevant Medications  ? naproxen (NAPROSYN) 500 MG tablet  ? ?  ?Looking slightly better than last time, still has a drier appearance, will add Vaseline on top of Xeroform gauze. ? ?Follow up plan: ?Return in about 1 week (around 07/10/2021), or if symptoms worsen or fail to improve, for wound care recheck. ? ?Counseling provided for all of the vaccine components ?No orders of the defined types were placed in this  encounter. ? ? ?Caryl Pina, MD ?[place] ?07/03/2021, 9:41 AM ? ? ? ? ?

## 2021-07-11 ENCOUNTER — Ambulatory Visit (INDEPENDENT_AMBULATORY_CARE_PROVIDER_SITE_OTHER): Payer: Medicare Other | Admitting: Family Medicine

## 2021-07-11 ENCOUNTER — Encounter: Payer: Self-pay | Admitting: Family Medicine

## 2021-07-11 VITALS — BP 130/76 | HR 90 | Ht 60.0 in | Wt 129.0 lb

## 2021-07-11 DIAGNOSIS — S81801D Unspecified open wound, right lower leg, subsequent encounter: Secondary | ICD-10-CM

## 2021-07-11 NOTE — Progress Notes (Signed)
? ?  BP 130/76   Pulse 90   Ht 5' (1.524 m)   Wt 129 lb (58.5 kg)   SpO2 98%   BMI 25.19 kg/m?   ? ?Subjective:  ? ?Patient ID: [name], female    DOB: December 16, 1932, 88 y.o.   MRN: [id number] ? ?HPI: ?[name] is a 88 y.o. female presenting on 07/11/2021 for Wound Check (LLE) ? ? ?HPI ?Patient is coming in today for recheck on left lower extremity wound.  She is changing her dressing every day twice a day putting triple antibiotic ointment and Vaseline and then Xeroform gauze and then wrapping it.  She says has been bleeding a little bit more and having slightly bloody but denies any infection or redness or warmth. ? ?Relevant past medical, surgical, family and social history reviewed and updated as indicated. Interim medical history since our last visit reviewed. ?Allergies and medications reviewed and updated. ? ?Review of Systems  ?Constitutional:  Negative for chills and fever.  ?Eyes:  Negative for visual disturbance.  ?Skin:  Positive for wound. Negative for rash.  ?Psychiatric/Behavioral:  Negative for agitation and behavioral problems.   ?All other systems reviewed and are negative. ? ?Per HPI unless specifically indicated above ? ? ? ?Objective:  ? ?BP 130/76   Pulse 90   Ht 5' (1.524 m)   Wt 129 lb (58.5 kg)   SpO2 98%   BMI 25.19 kg/m?   ?Wt Readings from Last 3 Encounters:  ?07/11/21 129 lb (58.5 kg)  ?07/03/21 129 lb (58.5 kg)  ?06/26/21 128 lb (58.1 kg)  ?  ?Physical Exam ?Vitals and nursing note reviewed.  ?Constitutional:   ?   General: She is not in acute distress. ?   Appearance: She is well-developed. She is not diaphoretic.  ?Skin: ?   General: Skin is warm and dry.  ?   Findings: Wound (Wound is about the same size but does appear to be less deep and has a more pink granular tissue base with more blood flow.  Not as dry) present. No erythema or rash.  ?Neurological:  ?   Mental Status: She is alert and oriented to person, place, and time.  ?   Coordination: Coordination normal.   ?Psychiatric:     ?   Behavior: Behavior normal.  ? ? ?Continue with twice daily changes with Xeroform gauze and Vaseline and then wrap ? ?Assessment & Plan:  ? ?Problem List Items Addressed This Visit   ?None ?Visit Diagnoses   ? ? Wound of right lower extremity, subsequent encounter    -  Primary  ? ?  ?  ? ?Follow up plan: ?Return in about 1 week (around 07/18/2021), or if symptoms worsen or fail to improve, for wound recheck. ? ?Counseling provided for all of the vaccine components ?No orders of the defined types were placed in this encounter. ? ? ?Caryl Pina, MD ?[place] ?07/11/2021, 4:47 PM ? ? ?  ?

## 2021-07-18 ENCOUNTER — Other Ambulatory Visit: Payer: Self-pay | Admitting: Family Medicine

## 2021-07-18 ENCOUNTER — Ambulatory Visit (INDEPENDENT_AMBULATORY_CARE_PROVIDER_SITE_OTHER): Payer: Medicare Other | Admitting: Family Medicine

## 2021-07-18 ENCOUNTER — Encounter: Payer: Self-pay | Admitting: Family Medicine

## 2021-07-18 VITALS — BP 138/72 | HR 73

## 2021-07-18 DIAGNOSIS — S81801D Unspecified open wound, right lower leg, subsequent encounter: Secondary | ICD-10-CM | POA: Diagnosis not present

## 2021-07-18 DIAGNOSIS — L89892 Pressure ulcer of other site, stage 2: Secondary | ICD-10-CM

## 2021-07-18 NOTE — Progress Notes (Signed)
? ?BP 138/72   Pulse 73   SpO2 99%   ? ?Subjective:  ? ?Patient ID: [name], female    DOB: [birth year], 88 y.o.   MRN: [id number] ? ?HPI: ?[name] is a 88 y.o. female presenting on 07/18/2021 for Wound Check ? ? ?HPI ?Wound care recheck ?Patient is coming in today for wound care recheck and is continuing to change it 3 times daily.  She says it has been bleeding some and she has some pain and it has been more itchy at times.  She denies any redness or warmth or purulent drainage ? ?Relevant past medical, surgical, family and social history reviewed and updated as indicated. Interim medical history since our last visit reviewed. ?Allergies and medications reviewed and updated. ? ?Review of Systems  ?Skin:  Positive for wound. Negative for color change and rash.  ? ?Per HPI unless specifically indicated above ? ? ?Allergies as of 07/18/2021   ? ?   Reactions  ? Lisinopril Swelling  ? Tongue swelling  ? ?  ? ?  ?Medication List  ?  ? ?  ? Accurate as of July 18, 2021  4:17 PM. If you have any questions, ask your nurse or doctor.  ?  ?  ? ?  ? ?alendronate 70 MG tablet ?Commonly known as: FOSAMAX ?TAKE 1 TABLET BY MOUTH EVERY 7 (SEVEN) DAYS. TAKE WITH A FULL GLASS OF WATER ON AN EMPTY STOMACH. ?  ?amLODipine 5 MG tablet ?Commonly known as: NORVASC ?Take 1 tablet (5 mg total) by mouth daily. ?  ?atorvastatin 40 MG tablet ?Commonly known as: LIPITOR ?TAKE 1 TABLET BY MOUTH EVERYDAY AT BEDTIME ?What changed: See the new instructions. ?Changed by: Worthy Rancher, MD ?  ?bacitracin-polymyxin b ophthalmic ointment ?Commonly known as: POLYSPORIN ?Place 1 application into both eyes 2 (two) times daily. apply to eye every 12 hours while awake ?  ?loratadine 10 MG tablet ?Commonly known as: CLARITIN ?Take 1 tablet (10 mg total) by mouth daily. ?  ?naproxen 500 MG tablet ?Commonly known as: Naprosyn ?Take 1 tablet (500 mg total) by mouth 2 (two) times daily with a meal. ?  ? ?  ? ? ? ?Objective:  ? ?BP 138/72    Pulse 73   SpO2 99%   ?Wt Readings from Last 3 Encounters:  ?07/11/21 129 lb (58.5 kg)  ?07/03/21 129 lb (58.5 kg)  ?06/26/21 128 lb (58.1 kg)  ?  ?Physical Exam ?Vitals and nursing note reviewed.  ?Constitutional:   ?   Appearance: Normal appearance.  ?Skin: ?   Findings: Wound (Wound of left inner foot overlying bunion, about the same size) present.  ?Neurological:  ?   Mental Status: She is alert.  ? ? ?Wound does look a little drier in the center, she is changing 3 times a day, continue changing the make sure to put more Vaseline and more moisture ? ?Assessment & Plan:  ? ?Problem List Items Addressed This Visit   ?None ?Visit Diagnoses   ? ? Wound of right lower extremity, subsequent encounter    -  Primary  ? Pressure injury of left foot, stage 2 ([place])      ? ?  ?  ? ?Follow up plan: ?Return in about 1 week (around 07/25/2021), or if symptoms worsen or fail to improve, for Wound recheck. ? ?Counseling provided for all of the vaccine components ?No orders of the defined types were placed in this encounter. ? ? ?Caryl Pina, MD ?[facility] ?07/18/2021, 4:17 PM ? ? ? ? ?

## 2021-07-25 ENCOUNTER — Ambulatory Visit (INDEPENDENT_AMBULATORY_CARE_PROVIDER_SITE_OTHER): Payer: Medicare Other | Admitting: Family Medicine

## 2021-07-25 ENCOUNTER — Encounter: Payer: Self-pay | Admitting: Family Medicine

## 2021-07-25 VITALS — BP 118/66 | HR 87 | Temp 97.4°F | Resp 20 | Ht 60.0 in | Wt 129.0 lb

## 2021-07-25 DIAGNOSIS — S81801D Unspecified open wound, right lower leg, subsequent encounter: Secondary | ICD-10-CM | POA: Diagnosis not present

## 2021-07-25 DIAGNOSIS — L89892 Pressure ulcer of other site, stage 2: Secondary | ICD-10-CM

## 2021-07-25 NOTE — Progress Notes (Signed)
? ?BP 118/66   Pulse 87   Temp (!) 97.4 ?F (36.3 ?C)   Resp 20   Ht 5' (1.524 m)   Wt 129 lb (58.5 kg)   SpO2 97%   BMI 25.19 kg/m?   ? ?Subjective:  ? ?Patient ID: [name], female    DOB: [birth year], 88 y.o.   MRN: [id number] ? ?HPI: ?[name] is a 88 y.o. female presenting on 07/25/2021 for LLE wound check  ? ? ?HPI ?Wound care recheck ?Patient is coming in for wound care recheck on the inner aspect of her foot overlying a bunion.  She has been having some bleeding from it and some pain from it.  She denies any redness or warmth. ? ?Relevant past medical, surgical, family and social history reviewed and updated as indicated. Interim medical history since our last visit reviewed. ?Allergies and medications reviewed and updated. ? ?Review of Systems  ?Constitutional:  Negative for chills and fever.  ?Eyes:  Negative for visual disturbance.  ?Respiratory:  Negative for chest tightness and shortness of breath.   ?Cardiovascular:  Negative for chest pain and leg swelling.  ?Skin:  Positive for wound. Negative for color change.  ?Psychiatric/Behavioral:  Negative for agitation and behavioral problems.   ?All other systems reviewed and are negative. ? ?Per HPI unless specifically indicated above ? ? ?Allergies as of 07/25/2021   ? ?   Reactions  ? Penicillin G Hives  ? Lisinopril Swelling  ? Tongue swelling  ? ?  ? ?  ?Medication List  ?  ? ?  ? Accurate as of July 25, 2021  4:26 PM. If you have any questions, ask your nurse or doctor.  ?  ?  ? ?  ? ?alendronate 70 MG tablet ?Commonly known as: FOSAMAX ?TAKE 1 TABLET BY MOUTH EVERY 7 (SEVEN) DAYS. TAKE WITH A FULL GLASS OF WATER ON AN EMPTY STOMACH. ?  ?amLODipine 5 MG tablet ?Commonly known as: NORVASC ?Take 1 tablet (5 mg total) by mouth daily. ?  ?atorvastatin 40 MG tablet ?Commonly known as: LIPITOR ?TAKE 1 TABLET BY MOUTH EVERYDAY AT BEDTIME ?  ?bacitracin-polymyxin b ophthalmic ointment ?Commonly known as: POLYSPORIN ?Place 1 application into both  eyes 2 (two) times daily. apply to eye every 12 hours while awake ?  ?loratadine 10 MG tablet ?Commonly known as: CLARITIN ?Take 1 tablet (10 mg total) by mouth daily. ?  ?naproxen 500 MG tablet ?Commonly known as: Naprosyn ?Take 1 tablet (500 mg total) by mouth 2 (two) times daily with a meal. ?  ? ?  ? ? ? ?Objective:  ? ?BP 118/66   Pulse 87   Temp (!) 97.4 ?F (36.3 ?C)   Resp 20   Ht 5' (1.524 m)   Wt 129 lb (58.5 kg)   SpO2 97%   BMI 25.19 kg/m?   ?Wt Readings from Last 3 Encounters:  ?07/25/21 129 lb (58.5 kg)  ?07/11/21 129 lb (58.5 kg)  ?07/03/21 129 lb (58.5 kg)  ?  ?Physical Exam ?Vitals and nursing note reviewed.  ?Constitutional:   ?   Appearance: Normal appearance.  ?Skin: ?   Findings: Wound (Right inner toe wound, appears more moist today, no purulence or erythema.  Removed small scar tissue from center with simple debridement) present.  ?Neurological:  ?   Mental Status: She is alert.  ? ? ?Wound care: Simple debridement unable to remove a large chunk of dry or scab from the center of the wound, minimal bleeding,  used forceps and simple suture to remove the scabbed area.  Patient tolerated well. ? ?Assessment & Plan:  ? ?Problem List Items Addressed This Visit   ?None ?Visit Diagnoses   ? ? Wound of right lower extremity, subsequent encounter    -  Primary  ? Pressure injury of left foot, stage 2 ([place])      ? ?  ?  ?Continue wound care changes twice daily. ?Follow up plan: ?Return in about 2 weeks (around 08/08/2021), or if symptoms worsen or fail to improve, for Wound care recheck. ? ?Counseling provided for all of the vaccine components ?No orders of the defined types were placed in this encounter. ? ? ?Caryl Pina, MD ?[place] ?07/25/2021, 4:26 PM ? ? ? ? ?

## 2021-08-09 ENCOUNTER — Ambulatory Visit (INDEPENDENT_AMBULATORY_CARE_PROVIDER_SITE_OTHER): Payer: Medicare Other | Admitting: Family Medicine

## 2021-08-09 ENCOUNTER — Encounter: Payer: Self-pay | Admitting: Family Medicine

## 2021-08-09 VITALS — BP 129/72 | HR 91 | Ht 60.0 in | Wt 123.0 lb

## 2021-08-09 DIAGNOSIS — S81801D Unspecified open wound, right lower leg, subsequent encounter: Secondary | ICD-10-CM

## 2021-08-09 MED ORDER — DICLOFENAC SODIUM 75 MG PO TBEC: 75.0000 mg | DELAYED_RELEASE_TABLET | Freq: Two times a day (BID) | ORAL | 2 refills | Status: DC

## 2021-08-09 MED ORDER — SULFAMETHOXAZOLE-TRIMETHOPRIM 800-160 MG PO TABS: 1.0000 | ORAL_TABLET | Freq: Two times a day (BID) | ORAL | 0 refills | Status: DC

## 2021-08-09 NOTE — Progress Notes (Signed)
? ?BP 129/72   Pulse 91   Ht 5' (1.524 m)   Wt 123 lb (55.8 kg)   SpO2 97%   BMI 24.02 kg/m?   ? ?Subjective:  ? ?Patient ID: [name], female    DOB: 1933/01/15, 88 y.o.   MRN: [id number] ? ?HPI: ?[name] is a 88 y.o. female presenting on 08/09/2021 for Wound Check (RLE) ? ? ?HPI ?Right lower extremity wound ?Patient is coming in for right lower extremity wound.  She has been scrubbing it for 2 or 3 times or 4 times a day sometimes and cleaning at it and there is 1 spot where it is showing more of why it is looking like he is getting closer to the bone.  She denies any fevers or chills or redness or warmth.  She has been doing the dressings very frequently every day. ? ?Relevant past medical, surgical, family and social history reviewed and updated as indicated. Interim medical history since our last visit reviewed. ?Allergies and medications reviewed and updated. ? ?Review of Systems  ?Constitutional:  Negative for chills and fever.  ?Eyes:  Negative for visual disturbance.  ?Respiratory:  Negative for chest tightness and shortness of breath.   ?Cardiovascular:  Negative for chest pain and leg swelling.  ?Skin:  Positive for wound. Negative for color change and rash.  ?Neurological:  Negative for light-headedness and headaches.  ?Psychiatric/Behavioral:  Negative for agitation and behavioral problems.   ?All other systems reviewed and are negative. ? ?Per HPI unless specifically indicated above ? ? ?Allergies as of 08/09/2021   ? ?   Reactions  ? Penicillin G Hives  ? Lisinopril Swelling  ? Tongue swelling  ? ?  ? ?  ?Medication List  ?  ? ?  ? Accurate as of August 09, 2021  4:51 PM. If you have any questions, ask your nurse or doctor.  ?  ?  ? ?  ? ?STOP taking these medications   ? ?naproxen 500 MG tablet ?Commonly known as: Naprosyn ?Stopped by: Worthy Rancher, MD ?  ? ?  ? ?TAKE these medications   ? ?alendronate 70 MG tablet ?Commonly known as: FOSAMAX ?TAKE 1 TABLET BY MOUTH EVERY 7 (SEVEN)  DAYS. TAKE WITH A FULL GLASS OF WATER ON AN EMPTY STOMACH. ?  ?amLODipine 5 MG tablet ?Commonly known as: NORVASC ?Take 1 tablet (5 mg total) by mouth daily. ?  ?atorvastatin 40 MG tablet ?Commonly known as: LIPITOR ?TAKE 1 TABLET BY MOUTH EVERYDAY AT BEDTIME ?  ?bacitracin-polymyxin b ophthalmic ointment ?Commonly known as: POLYSPORIN ?Place 1 application into both eyes 2 (two) times daily. apply to eye every 12 hours while awake ?  ?diclofenac 75 MG EC tablet ?Commonly known as: VOLTAREN ?Take 1 tablet (75 mg total) by mouth 2 (two) times daily. ?Started by: Worthy Rancher, MD ?  ?loratadine 10 MG tablet ?Commonly known as: CLARITIN ?Take 1 tablet (10 mg total) by mouth daily. ?  ?sulfamethoxazole-trimethoprim 800-160 MG tablet ?Commonly known as: BACTRIM DS ?Take 1 tablet by mouth 2 (two) times daily. ?Started by: Worthy Rancher, MD ?  ? ?  ? ? ? ?Objective:  ? ?BP 129/72   Pulse 91   Ht 5' (1.524 m)   Wt 123 lb (55.8 kg)   SpO2 97%   BMI 24.02 kg/m?   ?Wt Readings from Last 3 Encounters:  ?08/09/21 123 lb (55.8 kg)  ?07/25/21 129 lb (58.5 kg)  ?07/11/21 129 lb (58.5 kg)  ?  ?  Physical Exam ?Vitals and nursing note reviewed.  ?Constitutional:   ?   Appearance: Normal appearance.  ?Skin: ?   General: Skin is warm.  ?   Findings: Wound present. No rash.  ?Neurological:  ?   Mental Status: She is alert.  ? ? ? ? ?Assessment & Plan:  ? ?Problem List Items Addressed This Visit   ?None ?Visit Diagnoses   ? ? Wound of right lower extremity, subsequent encounter    -  Primary  ? Relevant Medications  ? diclofenac (VOLTAREN) 75 MG EC tablet  ? sulfamethoxazole-trimethoprim (BACTRIM DS) 800-160 MG tablet  ? Other Relevant Orders  ? AMB referral to wound care center  ? ?  ?Looking worse in one spot, concern for going to the bone, and the other spot has good granulation tissue but on the 1 spot in the center of it it does look like it is going more towards the bone and will refer to wound care, also given  antibiotic for prevention at this point. ? ?Follow up plan: ?Return in about 2 weeks (around 08/23/2021), or if symptoms worsen or fail to improve, for wound. ? ?Counseling provided for all of the vaccine components ?Orders Placed This Encounter  ?Procedures  ? AMB referral to wound care center  ? ? ?Caryl Pina, MD ?[place] ?08/09/2021, 4:51 PM ? ? ?  ?

## 2021-08-20 ENCOUNTER — Encounter (HOSPITAL_BASED_OUTPATIENT_CLINIC_OR_DEPARTMENT_OTHER): Payer: Medicare Other | Attending: General Surgery | Admitting: General Surgery

## 2021-08-20 DIAGNOSIS — M069 Rheumatoid arthritis, unspecified: Secondary | ICD-10-CM | POA: Diagnosis not present

## 2021-08-20 DIAGNOSIS — L97522 Non-pressure chronic ulcer of other part of left foot with fat layer exposed: Secondary | ICD-10-CM | POA: Diagnosis not present

## 2021-08-20 DIAGNOSIS — L97526 Non-pressure chronic ulcer of other part of left foot with bone involvement without evidence of necrosis: Secondary | ICD-10-CM | POA: Insufficient documentation

## 2021-08-20 NOTE — Progress Notes (Signed)
[name], [name] ([id number]) ?Visit Report for 08/20/2021 ?Abuse Risk Screen Details ?Patient Name: Date of Service: ?A LV [name], [name] 08/20/2021 9:00 A M ?Medical Record Number: [id number] ?Patient Account Number: [id number] ?Date of Birth/Sex: Treating RN: ?08/13/1932 (88 y.o. [name] ?Primary Care Bjorn Hallas: Dettinger, Vonna Kotyk Other Clinician: ?Referring Samuele Storey: ?Treating Ziv Welchel/Extender: Fredirick Maudlin ?Dettinger, Vonna Kotyk ?Weeks in Treatment: 0 ?Abuse Risk Screen Items ?Answer ?ABUSE RISK SCREEN: ?Has anyone close to you tried to hurt or harm you recentlyo No ?Do you feel uncomfortable with anyone in your familyo No ?Has anyone forced you do things that you didnt want to doo No ?Electronic Signature(s) ?Signed: 08/20/2021 5:03:59 PM By: Adline Peals ?Entered By: Adline Peals on 08/20/2021 09:58:35 ?-------------------------------------------------------------------------------- ?Activities of Daily Living Details ?Patient Name: Date of Service: ?A LV [name], [name] 08/20/2021 9:00 A M ?Medical Record Number: [id number] ?Patient Account Number: [id number] ?Date of Birth/Sex: Treating RN: ?[birth year] (88 y.o. [name] ?Primary Care Cheyne Boulden: Dettinger, Vonna Kotyk Other Clinician: ?Referring [place] Lesure: ?Treating Makaleigh Reinard/Extender: Fredirick Maudlin ?Dettinger, Vonna Kotyk ?Weeks in Treatment: 0 ?Activities of Daily Living Items ?Answer ?Activities of Daily Living (Please select one for each item) ?Drive Automobile Not Able ?T Medications ?ake Completely Able ?Use T elephone Completely Able ?Care for Appearance Completely Able ?Use T oilet Completely Able ?Bath / Shower Completely Able ?Dress Self Completely Able ?Feed Self Completely Able ?Walk Need Assistance ?Get In / Out Bed Completely Able ?Housework Completely Able ?Prepare Meals Completely Able ?Handle Money Completely Able ?Shop for Self Need Assistance ?Electronic Signature(s) ?Signed: 08/20/2021 5:03:59 PM By: Adline Peals ?Entered By:  Adline Peals on 08/20/2021 10:00:08 ?-------------------------------------------------------------------------------- ?Education Screening Details ?Patient Name: ?Date of Service: ?A LV [name], [name] 08/20/2021 9:00 A M ?Medical Record Number: [id number] ?Patient Account Number: [id number] ?Date of Birth/Sex: ?Treating RN: ?1932-08-02 (88 y.o. [name] ?Primary Care Khaila Velarde: Dettinger, Vonna Kotyk ?Other Clinician: ?Referring Maevyn Riordan: ?Treating Kimberlyann Hollar/Extender: Fredirick Maudlin ?Dettinger, Vonna Kotyk ?Weeks in Treatment: 0 ?Primary Learner Assessed: Patient ?Learning Preferences/Education Level/Primary Language ?Learning Preference: Explanation, Demonstration, Video, Printed Material ?Highest Education Level: Grade School ?Preferred Language: Spanish; Castilian ?Cognitive Barrier ?Language Barrier: Yes needs translator - Spanish ?Translator Needed: Yes Hospital Employed Language Interpreter ?Memory Deficit: No ?Emotional Barrier: No ?Cultural/Religious Beliefs Affecting Medical Care: No ?Physical Barrier ?Impaired Vision: No ?Impaired Hearing: No ?Decreased Hand dexterity: No ?Knowledge/Comprehension ?Knowledge Level: Medium ?Comprehension Level: Medium ?Ability to understand written instructions: Medium ?Ability to understand verbal instructions: Medium ?Motivation ?Anxiety Level: Calm ?Cooperation: Cooperative ?Education Importance: Acknowledges Need ?Interest in Health Problems: Asks Questions ?Perception: Coherent ?Willingness to Engage in Self-Management Medium ?Activities: ?Readiness to Engage in Self-Management Medium ?Activities: ?Electronic Signature(s) ?Signed: 08/20/2021 5:03:59 PM By: Adline Peals ?Entered By: Adline Peals on 08/20/2021 10:01:07 ?-------------------------------------------------------------------------------- ?Fall Risk Assessment Details ?Patient Name: ?Date of Service: ?A LV [name], [name] 08/20/2021 9:00 A M ?Medical Record Number: [id number] ?Patient Account Number:  [id number] ?Date of Birth/Sex: ?Treating RN: ?01-31-1933 (88 y.o. [name] ?Primary Care Chyanne Kohut: Dettinger, Vonna Kotyk ?Other Clinician: ?Referring Jemarcus Dougal: ?Treating Deaisa Merida/Extender: Fredirick Maudlin ?Dettinger, Vonna Kotyk ?Weeks in Treatment: 0 ?Fall Risk Assessment Items ?Have you had 2 or more falls in the last 12 monthso 0 No ?Have you had any fall that resulted in injury in the last 12 monthso 0 No ?FALLS RISK SCREEN ?History of falling - immediate or within 3 months 0 No ?Secondary diagnosis (Do you have 2 or more medical diagnoseso) 15 Yes ?Ambulatory aid ?None/bed rest/wheelchair/nurse 0 No ?Crutches/cane/walker 15 Yes ?Furniture 0 No ?Intravenous therapy Access/Saline/Heparin Lock 0 No ?  Gait/Transferring ?Normal/ bed rest/ wheelchair 0 No ?Weak (short steps with or without shuffle, stooped but able to lift head while walking, may seek 10 Yes ?support from furniture) ?Impaired (short steps with shuffle, may have difficulty arising from chair, head down, impaired 0 No ?balance) ?Mental Status ?Oriented to own ability 0 Yes ?Electronic Signature(s) ?Signed: 08/20/2021 5:03:59 PM By: Adline Peals ?Entered By: Adline Peals on 08/20/2021 10:02:23 ?-------------------------------------------------------------------------------- ?Foot Assessment Details ?Patient Name: ?Date of Service: ?A LV [name], [name] 08/20/2021 9:00 A M ?Medical Record Number: [id number] ?Patient Account Number: [id number] ?Date of Birth/Sex: ?Treating RN: ?11-13-1932 (88 y.o. [name] ?Primary Care Antwane Grose: Dettinger, Vonna Kotyk ?Other Clinician: ?Referring Thorne Wirz: ?Treating Yardley Lekas/Extender: Fredirick Maudlin ?Dettinger, Vonna Kotyk ?Weeks in Treatment: 0 ?Foot Assessment Items ?Site Locations ?+ = Sensation present, - = Sensation absent, C = Callus, U = Ulcer ?R = Redness, W = Warmth, M = Maceration, PU = Pre-ulcerative lesion ?F = Fissure, S = Swelling, D = Dryness ?Assessment ?Right: Left: ?Other Deformity: No  No ?Prior Foot Ulcer: No No ?Prior Amputation: No No ?Charcot Joint: No No ?Ambulatory Status: Ambulatory With Help ?Assistance Device: Walker ?Gait: Unsteady ?Electronic Signature(s) ?Signed: 08/20/2021 5:03:59 PM By: Adline Peals ?Entered By: Adline Peals on 08/20/2021 10:06:38 ?-------------------------------------------------------------------------------- ?Nutrition Risk Screening Details ?Patient Name: ?Date of Service: ?A LV [name], [name] 08/20/2021 9:00 A M ?Medical Record Number: [id number] ?Patient Account Number: [id number] ?Date of Birth/Sex: ?Treating RN: ?23-Jul-1932 (88 y.o. [name] ?Primary Care Towana Stenglein: Dettinger, Vonna Kotyk ?Other Clinician: ?Referring Clester Chlebowski: ?Treating Anuel Sitter/Extender: Fredirick Maudlin ?Dettinger, Vonna Kotyk ?Weeks in Treatment: 0 ?Height (in): 60 ?Weight (lbs): 119 ?Body Mass Index (BMI): 23.2 ?Nutrition Risk Screening Items ?Score Screening ?NUTRITION RISK SCREEN: ?I have an illness or condition that made me change the kind and/or amount of food I eat 0 No ?I eat fewer than two meals per day 3 Yes ?I eat few fruits and vegetables, or milk products 0 No ?I have three or more drinks of beer, liquor or wine almost every day 0 No ?I have tooth or mouth problems that make it hard for me to eat 0 No ?I don't always have enough money to buy the food I need 0 No ?I eat alone most of the time 0 No ?I take three or more different prescribed or over-the-counter drugs a day 1 Yes ?Without wanting to, I have lost or gained 10 pounds in the last six months 2 Yes ?I am not always physically able to shop, cook and/or feed myself 0 No ?Nutrition Protocols ?Good Risk Protocol ?Moderate Risk Protocol ?High Risk Proctocol 0 Provide education on nutrition ?Risk Level: High Risk ?Score: 6 ?Electronic Signature(s) ?Signed: 08/20/2021 5:03:59 PM By: Adline Peals ?Entered By: Adline Peals on 08/20/2021 10:04:16 ?

## 2021-08-20 NOTE — Progress Notes (Signed)
TALAH, COOKSTON ([id number]) ?Visit Report for 08/20/2021 ?Chief Complaint Document Details ?Patient Name: Date of Service: ?[name], Mika 08/20/2021 9:00 A M ?Medical Record Number: [id number] ?Patient Account Number: [id number] ?Date of Birth/Sex: Treating RN: ?[birth year] (88 y.o. F) Boehlein, Linda ?Primary Care Provider: Dettinger, Vonna Kotyk Other Clinician: ?Referring Provider: ?Treating Provider/Extender: Fredirick Maudlin ?Dettinger, Vonna Kotyk ?Weeks in Treatment: 0 ?Information Obtained from: Patient ?Chief Complaint ?Patient is at the clinic for treatment of an open ulcer on her left medial first metatarsal head, secondary to friction and pressure. ?Electronic Signature(s) ?Signed: 08/20/2021 11:43:04 AM By: Fredirick Maudlin MD FACS ?Entered By: Fredirick Maudlin on 08/20/2021 11:43:03 ?-------------------------------------------------------------------------------- ?Debridement Details ?Patient Name: Date of Service: ?[name], Thatiana 08/20/2021 9:00 A M ?Medical Record Number: [id number] ?Patient Account Number: [id number] ?Date of Birth/Sex: Treating RN: ?03-19-1933 (88 y.o. [name] ?Primary Care Provider: Dettinger, Vonna Kotyk Other Clinician: ?Referring Provider: ?Treating Provider/Extender: Fredirick Maudlin ?Dettinger, Vonna Kotyk ?Weeks in Treatment: 0 ?Debridement Performed for Assessment: Wound #1 Left,Medial Foot ?Performed By: Physician Fredirick Maudlin, MD ?Debridement Type: Debridement ?Level of Consciousness (Pre-procedure): Awake and Alert ?Pre-procedure Verification/Time Out Yes - 10:28 ?Taken: ?Start Time: 10:28 ?Pain Control: Lidocaine 4% T opical Solution ?T Area Debrided (L x W): ?otal 2.2 (cm) x 2 (cm) = 4.4 (cm?) ?Tissue and other material debrided: ?Viable, Non-Viable, Callus, Slough, Skin: Epidermis, Slough ?Level: Skin/Epidermis ?Debridement Description: Selective/Open Wound ?Instrument: Curette ?Bleeding: Minimum ?Hemostasis Achieved: Pressure ?Procedural Pain: 0 ?Post Procedural Pain:  0 ?Response to Treatment: Procedure was tolerated well ?Level of Consciousness (Post- Awake and Alert ?procedure): ?Post Debridement Measurements of Total Wound ?Length: (cm) 2.2 ?Width: (cm) 2 ?Depth: (cm) 0.1 ?Volume: (cm?) 0.346 ?Character of Wound/Ulcer Post Debridement: Improved ?Post Procedure Diagnosis ?Same as Pre-procedure ?Electronic Signature(s) ?Signed: 08/20/2021 12:20:38 PM By: Fredirick Maudlin MD FACS ?Signed: 08/20/2021 5:03:59 PM By: Adline Peals ?Entered By: Adline Peals on 08/20/2021 10:32:03 ?-------------------------------------------------------------------------------- ?HPI Details ?Patient Name: Date of Service: ?[name], Chariti 08/20/2021 9:00 A M ?Medical Record Number: [id number] ?Patient Account Number: [id number] ?Date of Birth/Sex: Treating RN: ?1932-04-22 (88 y.o. F) Boehlein, Linda ?Primary Care Provider: Dettinger, Vonna Kotyk Other Clinician: ?Referring Provider: ?Treating Provider/Extender: Fredirick Maudlin ?Dettinger, Vonna Kotyk ?Weeks in Treatment: 0 ?History of Present Illness ?HPI Description: ADMISSION ?08/20/2021 ?This is an 88 year old Spanish-speaking patient who is being evaluated for an ulcer on the medial aspect of the left first metatarsal head. She says that in ?February, her shoes were rubbing against a bunion in this location and she first developed a blister that ultimately broke down to an ulcer. She is not a diabetic ?and she does not smoke. She does have some high blood pressure as well as rheumatoid arthritis. ABI in clinic was noncompressible. When the wound first ?appeared, she was seen in urgent care and was started on Bactrim. Her PCP followed up and recommended daily Xeroform gauze dressing changes. Initially, it ?sounds like the wound was quite painful but it is now quite improved. Apparently she had been changing the dressing twice a day at a minimum, and per her ?PCP's note she may be scrubbing it quite aggressively up to 4 times a day. ?Today, the wound is  clean, but there is perimeter of undermining. I am not entirely sure that I am probing bone but we are certainly very close to it. The surface ?is a little bit pale but otherwise healthy. No odor or concern for infection. ?Electronic Signature(s) ?Signed: 08/20/2021 11:58:35 AM By: Fredirick Maudlin MD FACS ?Entered By: Fredirick Maudlin on  08/20/2021 11:58:35 ?-------------------------------------------------------------------------------- ?Physical Exam Details ?Patient Name: Date of Service: ?[name], Dala 08/20/2021 9:00 A M ?Medical Record Number: [id number] ?Patient Account Number: [id number] ?Date of Birth/Sex: Treating RN: ?10/28/1932 (88 y.o. F) Boehlein, Linda ?Primary Care Provider: Dettinger, Vonna Kotyk Other Clinician: ?Referring Provider: ?Treating Provider/Extender: Fredirick Maudlin ?Dettinger, Vonna Kotyk ?Weeks in Treatment: 0 ?Constitutional ?. . . . No acute distress. ?Respiratory ?Normal work of breathing on room air.Marland Kitchen ?Cardiovascular ?Feet are warm and she does not have any significant lower extremity edema.Marland Kitchen ?Notes ?08/20/2021: On the medial aspect of her first metatarsal head on the left, overlying what appears to be a bunion, she has a circular wound consistent with the ?history provided of friction causing a blister that subsequently broke down. The wound surface is pink albeit slightly pale. There is some periwound callus. The ?wound is undermined and there may be bone exposed, although it is not completely clear and I do not probe directly onto bone in this site. There is no odor or ?drainage. ?Electronic Signature(s) ?Signed: 08/20/2021 12:05:02 PM By: Fredirick Maudlin MD FACS ?Entered By: Fredirick Maudlin on 08/20/2021 12:05:02 ?-------------------------------------------------------------------------------- ?Physician Orders Details ?Patient Name: ?Date of Service: ?[name], Jalei 08/20/2021 9:00 A M ?Medical Record Number: [id number] ?Patient Account Number: [id number] ?Date of Birth/Sex: ?Treating  RN: ?1932-05-01 (88 y.o. 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 - Dr. Celine Ahr Room 1 5/9 10:30 ?Return A ?Bathing/ Shower/ Hygiene ?May shower and wash wound with soap and water. - with dressing changes ?Off-Loading ?Wound #1 Left,Medial Foot ?Open toe surgical shoe to: - L foot ?Wound Treatment ?Wound #1 - Foot Wound Laterality: Left, Medial ?Cleanser: Soap and Water 1 x Per Day/15 Days ?Discharge Instructions: May shower and wash wound with dial antibacterial soap and water prior to dressing change. ?Cleanser: Wound Cleanser 1 x Per Day/15 Days ?Discharge Instructions: Cleanse the wound with wound cleanser prior to applying a clean dressing using gauze sponges, not tissue or cotton balls. ?Cleanser: [place]ram Ancillary Kit - 15 Day Supply (DME) (Generic) 1 x Per Day/15 Days ?Discharge Instructions: Use supplies as instructed; Kit contains: (15) Saline Bullets; (15) 3x3 Gauze; 15 pr Gloves ?Peri-Wound Care: Sween Lotion (Moisturizing lotion) 1 x Per Day/15 Days ?Discharge Instructions: Apply moisturizing lotion as directed ?Prim Dressing: Promogran Prisma Matrix, 4.34 (sq in) (silver collagen) (DME) (Generic) 1 x Per Day/15 Days ?ary ?Discharge Instructions: Moisten collagen with saline or hydrogel ?Prim Dressing: Optifoam Non-Adhesive Dressing, 4x4 in (DME) (Generic) 1 x Per Day/15 Days ?ary ?Discharge Instructions: Apply to wound bed as instructed ?Secondary Dressing: Woven Gauze Sponge, Non-Sterile 4x4 in (DME) (Generic) 1 x Per Day/15 Days ?Discharge Instructions: Apply over primary dressing as directed. ?Secured With: Elastic  Bandage 4 inch (ACE bandage) (DME) (Generic) 1 x Per Day/15 Days ?Discharge Instructions: Secure with ACE bandage as directed. ?Secured With: [employer], 4.5x3.1 (in/yd) (DME) (Generic) 1 x Per Day/15 Days ?D

## 2021-08-21 DIAGNOSIS — S91309D Unspecified open wound, unspecified foot, subsequent encounter: Secondary | ICD-10-CM | POA: Diagnosis not present

## 2021-08-27 ENCOUNTER — Telehealth: Payer: Self-pay | Admitting: Family Medicine

## 2021-08-27 ENCOUNTER — Encounter (HOSPITAL_BASED_OUTPATIENT_CLINIC_OR_DEPARTMENT_OTHER): Payer: Medicare Other | Admitting: General Surgery

## 2021-08-27 DIAGNOSIS — L97522 Non-pressure chronic ulcer of other part of left foot with fat layer exposed: Secondary | ICD-10-CM | POA: Diagnosis not present

## 2021-08-27 DIAGNOSIS — M05872 Other rheumatoid arthritis with rheumatoid factor of left ankle and foot: Secondary | ICD-10-CM | POA: Diagnosis not present

## 2021-08-27 DIAGNOSIS — L89893 Pressure ulcer of other site, stage 3: Secondary | ICD-10-CM | POA: Diagnosis not present

## 2021-08-27 DIAGNOSIS — L97526 Non-pressure chronic ulcer of other part of left foot with bone involvement without evidence of necrosis: Secondary | ICD-10-CM | POA: Diagnosis not present

## 2021-08-27 DIAGNOSIS — M069 Rheumatoid arthritis, unspecified: Secondary | ICD-10-CM | POA: Diagnosis not present

## 2021-08-27 DIAGNOSIS — I10 Essential (primary) hypertension: Secondary | ICD-10-CM | POA: Diagnosis not present

## 2021-08-28 ENCOUNTER — Ambulatory Visit: Payer: Medicaid Other | Admitting: Family Medicine

## 2021-08-28 NOTE — Progress Notes (Signed)
[name], [name] ([id number]) ?Visit Report for 08/27/2021 ?Arrival Information Details ?Patient Name: Date of Service: ?[name], [name] 08/27/2021 10:30 [name] M ?Medical Record Number: [id number] ?Patient Account Number: [id number] ?Date of Birth/Sex: Treating RN: ?[birth year] (88 y.o. F) Boehlein, Linda ?Primary Care Patricie Geeslin: Dettinger, Vonna Kotyk Other Clinician: ?Referring Teniola Tseng: ?Treating Jahmez Bily/Extender: Fredirick Maudlin ?Dettinger, Vonna Kotyk ?Weeks in Treatment: 1 ?Visit Information History Since Last Visit ?Added or deleted any medications: No ?Patient Arrived: Ambulatory ?Any new allergies or adverse reactions: No ?Arrival Time: 10:27 ?Had [name] fall or experienced change in No ?Accompanied By: son ?activities of daily living that may affect ?Transfer Assistance: None ?risk of falls: ?Patient Identification Verified: Yes ?Signs or symptoms of abuse/neglect since last visito No ?Secondary Verification Process Completed: Yes ?Hospitalized since last visit: No ?Patient Requires Transmission-Based Precautions: No ?Implantable device outside of the clinic excluding No ?cellular tissue based products placed in the center ?since last visit: ?Has Dressing in Place as Prescribed: Yes ?Pain Present Now: No ?Electronic Signature(s) ?Signed: 08/28/2021 7:47:55 AM By: Sandre Kitty ?Entered By: Sandre Kitty on 08/27/2021 10:27:22 ?-------------------------------------------------------------------------------- ?Encounter Discharge Information Details ?Patient Name: Date of Service: ?[name], [name] 08/27/2021 10:30 [name] M ?Medical Record Number: [id number] ?Patient Account Number: [id number] ?Date of Birth/Sex: Treating RN: ?10-27-1932 (88 y.o. [name] ?Primary Care Izayah Miner: Dettinger, Vonna Kotyk Other Clinician: ?Referring Othmar Ringer: ?Treating Keshawn Sundberg/Extender: Fredirick Maudlin ?Dettinger, Vonna Kotyk ?Weeks in Treatment: 1 ?Encounter Discharge Information Items Post Procedure Vitals ?Discharge Condition: Stable ?Temperature  (F): 97.8 ?Ambulatory Status: [name] ?Pulse (bpm): 76 ?Discharge Destination: Home ?Respiratory Rate (breaths/min): 18 ?Transportation: Private Auto ?Blood Pressure (mmHg): 162/74 ?Accompanied By: self ?Schedule Follow-up Appointment: Yes ?Clinical Summary of Care: Patient Declined ?Electronic Signature(s) ?Signed: 08/27/2021 5:12:22 PM By: Adline Peals ?Entered By: Adline Peals on 08/27/2021 11:00:37 ?-------------------------------------------------------------------------------- ?Lower Extremity Assessment Details ?Patient Name: ?Date of Service: ?[name], [name] 08/27/2021 10:30 [name] M ?Medical Record Number: [id number] ?Patient Account Number: [id number] ?Date of Birth/Sex: ?Treating RN: ?[birth year] (88 y.o. [name] ?Primary Care Brax Walen: Dettinger, Vonna Kotyk ?Other Clinician: ?Referring Shiah Berhow: ?Treating Cara Thaxton/Extender: Fredirick Maudlin ?Dettinger, Vonna Kotyk ?Weeks in Treatment: 1 ?Edema Assessment ?Assessed: [Left: No] [Right: No] ?E[Left: dema] [Right: :] ?Calf ?Left: Right: ?Point of Measurement: 33 cm From Medial Instep 30.6 cm ?Ankle ?Left: Right: ?Point of Measurement: 9 cm From Medial Instep 22 cm ?Vascular Assessment ?Pulses: ?Dorsalis Pedis ?Palpable: [Left:Yes] ?Electronic Signature(s) ?Signed: 08/27/2021 5:12:22 PM By: Adline Peals ?Entered By: Adline Peals on 08/27/2021 10:34:31 ?-------------------------------------------------------------------------------- ?Multi Wound Chart Details ?Patient Name: ?Date of Service: ?[name], [name] 08/27/2021 10:30 [name] M ?Medical Record Number: [id number] ?Patient Account Number: [id number] ?Date of Birth/Sex: ?Treating RN: ?02/08/1933 (88 y.o. F) Boehlein, Linda ?Primary Care Azazel Franze: Dettinger, Vonna Kotyk ?Other Clinician: ?Referring Jack Mineau: ?Treating Erisha Paugh/Extender: Fredirick Maudlin ?Dettinger, Vonna Kotyk ?Weeks in Treatment: 1 ?Vital Signs ?Height(in): 60 ?Pulse(bpm): 76 ?Weight(lbs): 119 ?Blood Pressure(mmHg): 162/74 ?Body Mass  Index(BMI): 23.2 ?Temperature(??F): 97.8 ?Respiratory Rate(breaths/min): 18 ?Photos: [1:Left, Medial Foot] [N/[name]:N/[name] N/[name]] ?Wound Location: [1:Gradually Appeared] [N/[name]:N/[name]] ?Wounding Event: [1:Pressure Ulcer] [N/[name]:N/[name]] ?Primary Etiology: [1:Cataracts, Hypertension, Rheumatoid N/[name]] ?Comorbid History: [1:Arthritis 05/22/2021] [N/[name]:N/[name]] ?Date Acquired: [1:1] [N/[name]:N/[name]] ?Weeks of Treatment: [1:Open] [N/[name]:N/[name]] ?Wound Status: [1:No] [N/[name]:N/[name]] ?Wound Recurrence: [1:2x1.9x0.2] [N/[name]:N/[name]] ?Measurements L x W x D (cm) [1:2.985] [N/[name]:N/[name]] ?[name] (cm?) : ?rea [1:0.597] [N/[name]:N/[name]] ?Volume (cm?) : [1:13.60%] [N/[name]:N/[name]] ?% Reduction in [name] [1:rea: -72.50%] [N/[name]:N/[name]] ?% Reduction in Volume: [1:7] ?Starting Position 1 (o'clock): [1:8] ?Ending Position 1 (o'clock): [1:0.2] ?Maximum Distance 1 (cm): [1:Yes] [N/[name]:N/[name]] ?Undermining: [1:Category/Stage III] [N/[name]:N/[name]] ?Classification: [1:Medium] [N/[name]:N/[name]] ?Exudate  [name] mount: [1:Serosanguineous] [N/[name]:N/[name]] ?Exudate Type: [1:red, brown] [N/[name]:N/[name]] ?Exudate Color: [1:Well defined, not attached] [N/[name]:N/[name]] ?Wound Margin: [1:Large (67-100%)] [N/[name]:N/[name]] ?Granulation [name] mount: [1:Red] [N/[name]:N/[name]] ?Granulation Quality: [1:Small (1-33%)] [N/[name]:N/[name]] ?Necrotic [name] mount: ?[1:Fat Layer (Subcutaneous Tissue): Yes N/[name]] ?Exposed Structures: ?[1:Fascia: No Tendon: No Muscle: No Joint: No Bone: No None] [N/[name]:N/[name]] ?Epithelialization: [1:Debridement - Selective/Open Wound N/[name]] ?Debridement: ?Pre-procedure Verification/Time Out 10:40 [N/[name]:N/[name]] ?Taken: [1:Lidocaine 4% Topical Solution] [N/[name]:N/[name]] ?Pain Control: [1:Callus] [N/[name]:N/[name]] ?Tissue Debrided: [1:Skin/Epidermis] [N/[name]:N/[name]] ?Level: [1:3.8] [N/[name]:N/[name]] ?Debridement [name] (sq cm): [1:rea Curette] [N/[name]:N/[name]] ?Instrument: [1:Minimum] [N/[name]:N/[name]] ?Bleeding: [1:Pressure] [N/[name]:N/[name]] ?Hemostasis [name] chieved: [1:0] [N/[name]:N/[name]] ?Procedural Pain: [1:0] [N/[name]:N/[name]] ?Post Procedural Pain: [1:Procedure was tolerated well] [N/[name]:N/[name]] ?Debridement Treatment Response: [1:2x1.9x0.2] [N/[name]:N/[name]] ?Post Debridement  Measurements L x ?W x D (cm) [1:0.597] [N/[name]:N/[name]] ?Post Debridement Volume: (cm?) [1:Category/Stage III] [N/[name]:N/[name]] ?Post Debridement Stage: [1:Debridement] [N/[name]:N/[name]] ?Treatment Notes ?Electronic Signature(s) ?Signed: 08/27/2021 10:49:14 AM By: Fredirick Maudlin MD FACS ?Signed: 08/28/2021 5:37:56 PM By: Baruch Gouty RN, BSN ?Entered By: Fredirick Maudlin on 08/27/2021 10:49:14 ?-------------------------------------------------------------------------------- ?Multi-Disciplinary Care Plan Details ?Patient Name: ?Date of Service: ?[name], [name] 08/27/2021 10:30 [name] M ?Medical Record Number: [id number] ?Patient Account Number: [id number] ?Date of Birth/Sex: ?Treating RN: ?Dec 20, 1932 (88 y.o. [name] ?Primary Care Valecia Beske: Dettinger, Vonna Kotyk ?Other Clinician: ?Referring Keiarra Charon: ?Treating Shaunessy Dobratz/Extender: Fredirick Maudlin ?Dettinger, Vonna Kotyk ?Weeks in Treatment: 1 ?Active Inactive ?Abuse / Safety / Falls / Self Care Management ?Nursing Diagnoses: ?Impaired physical mobility ?Potential for falls ?Potential for injury related to falls ?Goals: ?Patient will remain injury free related to falls ?Date Initiated: 08/20/2021 ?Target Resolution Date: 09/10/2021 ?Goal Status: Active ?Patient/caregiver will demonstrate safe use of adaptive devices to increase mobility ?Date Initiated: 08/20/2021 ?Target Resolution Date: 09/10/2021 ?Goal Status: Active ?Patient/caregiver will verbalize understanding of skin care regimen ?Date Initiated: 08/20/2021 ?Target Resolution Date: 09/10/2021 ?Goal Status: Active ?Interventions: ?Assess fall risk on admission and as needed ?Assess impairment of mobility on admission and as needed per policy ?Provide education on fall prevention ?Notes: ?Wound/Skin Impairment ?Nursing Diagnoses: ?Impaired tissue integrity ?Knowledge deficit related to ulceration/compromised skin integrity ?Goals: ?Patient/caregiver will verbalize understanding of skin care regimen ?Date Initiated: 08/20/2021 ?Target Resolution  Date: 09/10/2021 ?Goal Status: Active ?Ulcer/skin breakdown will have [name] volume reduction of 30% by week 4 ?Date Initiated: 08/20/2021 ?Target Resolution Date: 09/10/2021 ?Goal Status: Active ?Interventions: ?Assess pat

## 2021-08-28 NOTE — Telephone Encounter (Signed)
Last time she was here I gave her diclofenac which is a little stronger and different than what we had done previously.  If that is not working then she can come in we can discuss other options but I do not know of many other options that would be safe for her at this point. ?

## 2021-08-28 NOTE — Telephone Encounter (Signed)
Offered the 8:10 or 1:25 on 5/11. Son needs to check with his sister to see if she can bring pt. He will call back to inform. ? ?Son states that pt is yelling out in pain. Wanted to know if Diclofenac could be increased or if another med would help. Asked if Tylenol could be added. ?

## 2021-08-28 NOTE — Progress Notes (Signed)
[name], [name] ([id number]) ?Visit Report for 08/27/2021 ?Chief Complaint Document Details ?Patient Name: Date of Service: ?[name], [name] 08/27/2021 10:30 [name] M ?Medical Record Number: [id number] ?Patient Account Number: [id number] ?Date of Birth/Sex: Treating RN: ?[birth year] (88 y.o. F) Boehlein, Linda ?Primary Care Provider: Dettinger, Vonna Kotyk Other Clinician: ?Referring Provider: ?Treating Provider/Extender: Fredirick Maudlin ?Dettinger, Vonna Kotyk ?Weeks in Treatment: 1 ?Information Obtained from: Patient ?Chief Complaint ?Patient is at the clinic for treatment of an open ulcer on her left medial first metatarsal head, secondary to friction and pressure. ?Electronic Signature(s) ?Signed: 08/27/2021 10:49:20 AM By: Fredirick Maudlin MD FACS ?Entered By: Fredirick Maudlin on 08/27/2021 10:49:20 ?-------------------------------------------------------------------------------- ?Debridement Details ?Patient Name: Date of Service: ?[name], [name] 08/27/2021 10:30 [name] M ?Medical Record Number: [id number] ?Patient Account Number: [id number] ?Date of Birth/Sex: Treating RN: ?15-Dec-1932 (88 y.o. [name] ?Primary Care Provider: Dettinger, Vonna Kotyk Other Clinician: ?Referring Provider: ?Treating Provider/Extender: Fredirick Maudlin ?Dettinger, Vonna Kotyk ?Weeks in Treatment: 1 ?Debridement Performed for Assessment: Wound #1 Left,Medial Foot ?Performed By: Physician Fredirick Maudlin, MD ?Debridement Type: Debridement ?Level of Consciousness (Pre-procedure): Awake and Alert ?Pre-procedure Verification/Time Out Yes - 10:40 ?Taken: ?Start Time: 10:40 ?Pain Control: Lidocaine 4% T opical Solution ?T Area Debrided (L x W): ?otal 2 (cm) x 1.9 (cm) = 3.8 (cm?) ?Tissue and other material debrided: ?Non-Viable, Callus, Skin: Epidermis ?Level: Skin/Epidermis ?Debridement Description: Selective/Open Wound ?Instrument: Curette ?Bleeding: Minimum ?Hemostasis Achieved: Pressure ?Procedural Pain: 0 ?Post Procedural Pain: 0 ?Response to Treatment:  Procedure was tolerated well ?Level of Consciousness (Post- Awake and Alert ?procedure): ?Post Debridement Measurements of Total Wound ?Length: (cm) 2 ?Stage: Category/Stage III ?Width: (cm) 1.9 ?Depth: (cm) 0.2 ?Volume: (cm?) 0.597 ?Character of Wound/Ulcer Post Debridement: Improved ?Post Procedure Diagnosis ?Same as Pre-procedure ?Electronic Signature(s) ?Signed: 08/27/2021 11:52:00 AM By: Fredirick Maudlin MD FACS ?Signed: 08/27/2021 5:12:22 PM By: Adline Peals ?Entered By: Adline Peals on 08/27/2021 10:43:30 ?-------------------------------------------------------------------------------- ?HPI Details ?Patient Name: Date of Service: ?[name], [name] 08/27/2021 10:30 [name] M ?Medical Record Number: [id number] ?Patient Account Number: [id number] ?Date of Birth/Sex: Treating RN: ?1933/03/18 (88 y.o. F) Boehlein, Linda ?Primary Care Provider: Dettinger, Vonna Kotyk Other Clinician: ?Referring Provider: ?Treating Provider/Extender: Fredirick Maudlin ?Dettinger, Vonna Kotyk ?Weeks in Treatment: 1 ?History of Present Illness ?HPI Description: ADMISSION ?08/20/2021 ?This is an 88 year old Spanish-speaking patient who is being evaluated for an ulcer on the medial aspect of the left first metatarsal head. She says that in ?February, her shoes were rubbing against [name] bunion in this location and she first developed [name] blister that ultimately broke down to an ulcer. She is not [name] diabetic ?and she does not smoke. She does have some high blood pressure as well as rheumatoid arthritis. ABI in clinic was noncompressible. When the wound first ?appeared, she was seen in urgent care and was started on Bactrim. Her PCP followed up and recommended daily Xeroform gauze dressing changes. Initially, it ?sounds like the wound was quite painful but it is now quite improved. Apparently she had been changing the dressing twice [name] day at [name] minimum, and per her ?PCP's note she may be scrubbing it quite aggressively up to 4 times [name] day. ?Today, the wound is  clean, but there is perimeter of undermining. I am not entirely sure that I am probing bone but we are certainly very close to it. The surface ?is [name] little bit pale but otherwise healthy. No odor or concern for infection. ?08/27/2021: The wound is about the same today. There is [name] little bit more undermining underneath  the callus that is formed around the perimeter. The wound ?surface is pink and moist. No odor or significant drainage. ?Electronic Signature(s) ?Signed: 08/27/2021 10:50:03 AM By: Fredirick Maudlin MD FACS ?Entered By: Fredirick Maudlin on 08/27/2021 10:50:03 ?-------------------------------------------------------------------------------- ?Physical Exam Details ?Patient Name: Date of Service: ?[name], [name] 08/27/2021 10:30 [name] M ?Medical Record Number: [id number] ?Patient Account Number: [id number] ?Date of Birth/Sex: Treating RN: ?1932/06/03 (88 y.o. F) Boehlein, Linda ?Primary Care Provider: Dettinger, Vonna Kotyk Other Clinician: ?Referring Provider: ?Treating Provider/Extender: Fredirick Maudlin ?Dettinger, Vonna Kotyk ?Weeks in Treatment: 1 ?Constitutional ?Slightly hypertensive. . . . No acute distress. ?Respiratory ?Normal work of breathing on room air. ?Notes ?08/27/2021: The wound is about the same today. There is [name] little bit more undermining underneath the callus that is formed around the perimeter. The wound ?surface is pink and moist. No odor or significant drainage. ?Electronic Signature(s) ?Signed: 08/27/2021 10:53:04 AM By: Fredirick Maudlin MD FACS ?Signed: 08/27/2021 10:53:04 AM By: Fredirick Maudlin MD FACS ?Entered By: Fredirick Maudlin on 08/27/2021 10:53:04 ?-------------------------------------------------------------------------------- ?Physician Orders Details ?Patient Name: ?Date of Service: ?[name], [name] 08/27/2021 10:30 [name] M ?Medical Record Number: [id number] ?Patient Account Number: [id number] ?Date of Birth/Sex: ?Treating RN: ?05/25/1932 (88 y.o. 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 - Dr. Celine Ahr Room 2 5/17 9:15 AM ?Return [name] ?Bathing/ Shower/ Hygiene ?May shower and wash wound with soap and water. - with dressing changes ?Off-Loading ?Wound #1 Left,Medial Foot ?Open toe surgical shoe to: - L foot ?Wound Treatment ?Wound #1 - Foot Wound Laterality: Left, Medial ?Cleanser: Soap and Water 1 x Per Day/15 Days ?Discharge Instructions: May shower and wash wound with dial antibacterial soap and water prior to dressing change. ?Cleanser: Wound Cleanser 1 x Per Day/15 Days ?Discharge Instructions: Cleanse the wound with wound cleanser prior to applying [name] clean dressing using gauze sponges, not tissue or cotton balls. ?Peri-Wound Care: Sween Lotion (Moisturizing lotion) 1 x Per Day/15 Days ?Discharge Instructions: Apply moisturizing lotion as directed ?Prim Dressing: Promogran Prisma Matrix, 4.34 (sq in) (silver collagen) (Generic) 1 x Per Day/15 Days ?ary ?Discharge Instructions: Moisten collagen with saline or hydrogel ?Prim Dressing: Optifoam Non-Adhesive Dressing, 4x4 in (Generic) 1 x Per Day/15 Days ?ary ?Discharge Instructions: Apply to wound bed as instructed ?Secondary Dressing: Woven Gauze Sponge, Non-Sterile 4x4 in (Generic) 1 x Per Day/15 Days ?Discharge Instructions: Apply over primary dressing as directed. ?Secured With: Elastic Bandage 4 inch (ACE bandage) (Generic) 1 x Per Day/15 Days ?Discharge Instructions: Secure with ACE bandage as directed. ?Secured With: [employer], 4.5x3.1 (in/yd) (Generic) 1 x Per Day/15 Days ?Discharge Instructions: Secure with Kerlix as directed. ?Secured With: Paper Tape, 2x10  (in/yd) (Generic) 1 x Per Day/15 Days ?Discharge Instructions: Secure dressing with tape as directed. ?Patient Medications ?llergies: penicillin G, lisinopril ?[name] ?Notifications Medication Indication Start En

## 2021-09-04 ENCOUNTER — Encounter (HOSPITAL_BASED_OUTPATIENT_CLINIC_OR_DEPARTMENT_OTHER): Payer: Medicare Other | Admitting: General Surgery

## 2021-09-04 DIAGNOSIS — S91309D Unspecified open wound, unspecified foot, subsequent encounter: Secondary | ICD-10-CM | POA: Diagnosis not present

## 2021-09-04 DIAGNOSIS — L97526 Non-pressure chronic ulcer of other part of left foot with bone involvement without evidence of necrosis: Secondary | ICD-10-CM | POA: Diagnosis not present

## 2021-09-04 DIAGNOSIS — M069 Rheumatoid arthritis, unspecified: Secondary | ICD-10-CM | POA: Diagnosis not present

## 2021-09-04 DIAGNOSIS — L89893 Pressure ulcer of other site, stage 3: Secondary | ICD-10-CM | POA: Diagnosis not present

## 2021-09-06 NOTE — Progress Notes (Signed)
[name] ([id number]) Visit Report for 09/04/2021 Arrival Information Details Patient Name: Date of Service: [name], [name] 09/04/2021 9:15 [name] Medical Record Number: [id number] Patient Account Number: [id number] Date of Birth/Sex: Treating RN: 07-27-1932 (88 y.o. [name] Primary Care Emonee Winkowski: Dettinger, Vonna Kotyk Other Clinician: Referring Pieper Kasik: Treating Niobe Dick/Extender: Fredirick Maudlin Dettinger, [name] in Treatment: 2 Visit Information History Since Last Visit Added or deleted any medications: No Patient Arrived: [name] Any new allergies or adverse reactions: No Arrival Time: 09:21 Had [name] fall or experienced change in No Accompanied By: son/translator activities of daily living that may affect Transfer Assistance: None risk of falls: Patient Identification Verified: Yes Signs or symptoms of abuse/neglect since last visito No Secondary Verification Process Completed: Yes Hospitalized since last visit: No Patient Requires Transmission-Based Precautions: No Implantable device outside of the clinic excluding No cellular tissue based products placed in the center since last visit: Has Dressing in Place as Prescribed: Yes Pain Present Now: No Electronic Signature(s) Signed: 09/05/2021 8:19:25 AM By: Sandre Kitty Entered By: Sandre Kitty on 09/04/2021 09:22:10 -------------------------------------------------------------------------------- Encounter Discharge Information Details Patient Name: Date of Service: [name], [name] 09/04/2021 9:15 [name] Medical Record Number: [id number] Patient Account Number: [id number] Date of Birth/Sex: Treating RN: Jul 30, 1932 (88 y.o. [name] Primary Care Ceceilia Cephus: Dettinger, Vonna Kotyk Other Clinician: Referring Iola Turri: Treating Jerzie Bieri/Extender: Fredirick Maudlin Dettinger, [name] in Treatment: 2 Encounter Discharge Information Items Discharge Condition: Stable Ambulatory Status:  Walker Discharge Destination: Home Transportation: Private Auto Accompanied By: son Schedule Follow-up Appointment: Yes Clinical Summary of Care: Patient Declined Electronic Signature(s) Signed: 09/05/2021 12:43:38 PM By: Sharyn Creamer RN, BSN Entered By: Sharyn Creamer on 09/04/2021 09:57:47 -------------------------------------------------------------------------------- Lower Extremity Assessment Details Patient Name: Date of Service: [name], [name] 09/04/2021 9:15 [name] Medical Record Number: [id number] Patient Account Number: [id number] Date of Birth/Sex: Treating RN: 08-22-1932 (88 y.o. [name] Primary Care Audreyana Huntsberry: Dettinger, Vonna Kotyk Other Clinician: Referring Jaques Mineer: Treating Simar Pothier/Extender: Fredirick Maudlin Dettinger, [name] in Treatment: 2 Edema Assessment Assessed: Shirlyn Goltz: No] Patrice Paradise: No] E[Left: dema] [Right: :] Calf Left: Right: Point of Measurement: 33 cm From Medial Instep 31.5 cm Ankle Left: Right: Point of Measurement: 9 cm From Medial Instep 21 cm Vascular Assessment Pulses: Dorsalis Pedis Palpable: [Left:Yes] Electronic Signature(s) Signed: 09/05/2021 12:43:38 PM By: Sharyn Creamer RN, BSN Entered By: Sharyn Creamer on 09/04/2021 09:37:03 -------------------------------------------------------------------------------- Multi Wound Chart Details Patient Name: Date of Service: [name], [name] 09/04/2021 9:15 [name] Medical Record Number: [id number] Patient Account Number: [id number] Date of Birth/Sex: Treating RN: 1932-07-03 (88 y.o. [name] Primary Care Nicandro Perrault: Dettinger, Vonna Kotyk Other Clinician: Referring Alayjah Boehringer: Treating Jennipher Weatherholtz/Extender: Fredirick Maudlin Dettinger, [name] in Treatment: 2 Vital Signs Height(in): 60 Pulse(bpm): 7 Weight(lbs): 63 Blood Pressure(mmHg): 144/62 Body Mass Index(BMI): 23.2 Temperature(F): 98.0 Respiratory Rate(breaths/min): 18 Photos: [1:Left, Medial Foot] [N/[name]:N/[name]  N/[name]] Wound Location: [1:Gradually Appeared] [N/[name]:N/[name]] Wounding Event: [1:Pressure Ulcer] [N/[name]:N/[name]] Primary Etiology: [1:Cataracts, Hypertension, Rheumatoid N/[name]] Comorbid History: [1:Arthritis 05/22/2021] [N/[name]:N/[name]] Date Acquired: [1:2] [N/[name]:N/[name]] Weeks of Treatment: [1:Open] [N/[name]:N/[name]] Wound Status: [1:No] [N/[name]:N/[name]] Wound Recurrence: [1:2x2x0.2] [N/[name]:N/[name]] Measurements L x W x D (cm) [1:3.142] [N/[name]:N/[name]] [name] (cm) : rea [1:0.628] [N/[name]:N/[name]] Volume (cm) : [1:9.10%] [N/[name]:N/[name]] % Reduction in [name] rea: [1:-81.50%] [N/[name]:N/[name]] % Reduction in Volume: [1:9] Starting Position 1 (o'clock): [1:6] Ending Position 1 (o'clock): [1:0.5] Maximum Distance 1 (cm): [1:Yes] [N/[name]:N/[name]] Undermining: [1:Category/Stage III] [N/[name]:N/[name]] Classification: [1:Medium] [N/[name]:N/[name]] Exudate [name] mount: [1:Serosanguineous] [N/[name]:N/[name]] Exudate Type: [1:red, brown] [N/[name]:N/[name]] Exudate Color: [1:Well defined,  not attached] [N/[name]:N/[name]] Wound Margin: [1:Large (67-100%)] [N/[name]:N/[name]] Granulation [name] mount: [1:Red, Hyper-granulation] [N/[name]:N/[name]] Granulation Quality: [1:None Present (0%)] [N/[name]:N/[name]] Necrotic [name] mount: [1:Fat Layer (Subcutaneous Tissue): Yes N/[name]] Exposed Structures: [1:Fascia: No Tendon: No Muscle: No Joint: No Bone: No None] [N/[name]:N/[name]] Epithelialization: [1:Chemical Cauterization] [N/[name]:N/[name]] Treatment Notes Wound #1 (Foot) Wound Laterality: Left, Medial Cleanser Soap and Water Discharge Instruction: May shower and wash wound with dial antibacterial soap and water prior to dressing change. Wound Cleanser Discharge Instruction: Cleanse the wound with wound cleanser prior to applying [name] clean dressing using gauze sponges, not tissue or cotton balls. Peri-Wound Care Sween Lotion (Moisturizing lotion) Discharge Instruction: Apply moisturizing lotion as directed Topical Primary Dressing Promogran Prisma Matrix, 4.34 (sq in) (silver collagen) Discharge Instruction: Moisten collagen with saline or hydrogel Optifoam Non-Adhesive Dressing, 4x4  in Discharge Instruction: Apply to wound bed as instructed Secondary Dressing Woven Gauze Sponge, Non-Sterile 4x4 in Discharge Instruction: Apply over primary dressing as directed. Secured With Elastic Bandage 4 inch (ACE bandage) Discharge Instruction: Secure with ACE bandage as directed. Kerlix Roll Sterile, 4.5x3.1 (in/yd) Discharge Instruction: Secure with Kerlix as directed. Paper Tape, 2x10 (in/yd) Discharge Instruction: Secure dressing with tape as directed. Compression Wrap Compression Stockings Add-Ons Electronic Signature(s) Signed: 09/04/2021 9:59:08 AM By: Fredirick Maudlin MD FACS Signed: 09/05/2021 5:00:10 PM By: Sabas Sous By: Fredirick Maudlin on 09/04/2021 09:59:08 -------------------------------------------------------------------------------- Multi-Disciplinary Care Plan Details Patient Name: Date of Service: [name], [name] 09/04/2021 9:15 [name] Medical Record Number: [id number] Patient Account Number: [id number] Date of Birth/Sex: Treating RN: Mar 10, 1933 (88 y.o. [name] Primary Care Neizan Debruhl: Dettinger, Vonna Kotyk Other Clinician: Referring Franki Stemen: Treating Mariapaula Krist/Extender: Fredirick Maudlin Dettinger, [name] in Treatment: 2 Active Inactive Abuse / Safety / Falls / Self Care Management Nursing Diagnoses: Impaired physical mobility Potential for falls Potential for injury related to falls Goals: Patient will remain injury free related to falls Date Initiated: 08/20/2021 Target Resolution Date: 09/10/2021 Goal Status: Active Patient/caregiver will demonstrate safe use of adaptive devices to increase mobility Date Initiated: 08/20/2021 Target Resolution Date: 09/10/2021 Goal Status: Active Patient/caregiver will verbalize understanding of skin care regimen Date Initiated: 08/20/2021 Target Resolution Date: 09/10/2021 Goal Status: Active Interventions: Assess fall risk on admission and as needed Assess impairment of mobility on  admission and as needed per policy Provide education on fall prevention Notes: Wound/Skin Impairment Nursing Diagnoses: Impaired tissue integrity Knowledge deficit related to ulceration/compromised skin integrity Goals: Patient/caregiver will verbalize understanding of skin care regimen Date Initiated: 08/20/2021 Target Resolution Date: 09/10/2021 Goal Status: Active Ulcer/skin breakdown will have [name] volume reduction of 30% by week 4 Date Initiated: 08/20/2021 Target Resolution Date: 09/10/2021 Goal Status: Active Interventions: Assess patient/caregiver ability to perform ulcer/skin care regimen upon admission and as needed Assess ulceration(s) every visit Treatment Activities: Referred to DME Ein Rijo for dressing supplies : 08/20/2021 Skin care regimen initiated : 08/20/2021 Topical wound management initiated : 08/20/2021 Notes: Electronic Signature(s) Signed: 09/05/2021 12:43:38 PM By: Sharyn Creamer RN, BSN Entered By: Sharyn Creamer on 09/04/2021 09:37:17 -------------------------------------------------------------------------------- Pain Assessment Details Patient Name: Date of Service: [name], [name] 09/04/2021 9:15 [name] Medical Record Number: [id number] Patient Account Number: [id number] Date of Birth/Sex: Treating RN: November 23, 1932 (88 y.o. [name] Primary Care Najee Manninen: Dettinger, Vonna Kotyk Other Clinician: Referring Laqueisha Catalina: Treating Coben Godshall/Extender: Fredirick Maudlin Dettinger, [name] in Treatment: 2 Active Problems Location of Pain Severity and Description of Pain Patient Has Paino No Site Locations Pain Management and Medication Current Pain Management: Electronic Signature(s) Signed: 09/05/2021 8:19:25 AM  BySandre Kitty Signed: 09/05/2021 5:00:10 PM By: Sabas Sous BySandre Kitty on 09/04/2021 09:22:39 -------------------------------------------------------------------------------- Patient/Caregiver Education  Details Patient Name: Date of Service: [name], [name] 5/17/2023andnbsp9:15 [name] Medical Record Number: [id number] Patient Account Number: [id number] Date of Birth/Gender: Treating RN: 12/19/1932 (88 y.o. [name] Primary Care Physician: Dettinger, Vonna Kotyk Other Clinician: Referring Physician: Treating Physician/Extender: Fredirick Maudlin Dettinger, [name] in Treatment: 2 Education Assessment Education Provided To: Patient and Caregiver Education Topics Provided Wound/Skin Impairment: Methods: Explain/Verbal Responses: State content correctly Electronic Signature(s) Signed: 09/05/2021 12:43:38 PM By: Sharyn Creamer RN, BSN Entered By: Sharyn Creamer on 09/04/2021 09:37:34 -------------------------------------------------------------------------------- Wound Assessment Details Patient Name: Date of Service: [name], [name] 09/04/2021 9:15 [name] Medical Record Number: [id number] Patient Account Number: [id number] Date of Birth/Sex: Treating RN: 1932/08/07 (88 y.o. [name] Primary Care Neeraj Housand: Dettinger, Vonna Kotyk Other Clinician: Referring Tyleek Smick: Treating Masashi Snowdon/Extender: Fredirick Maudlin Dettinger, [name] in Treatment: 2 Wound Status Wound Number: 1 Primary Etiology: Pressure Ulcer Wound Location: Left, Medial Foot Wound Status: Open Wounding Event: Gradually Appeared Comorbid History: Cataracts, Hypertension, Rheumatoid Arthritis Date Acquired: 05/22/2021 Weeks Of Treatment: 2 Clustered Wound: No Photos Wound Measurements Length: (cm) 2 Width: (cm) 2 Depth: (cm) 0.2 Area: (cm) 3.142 Volume: (cm) 0.628 % Reduction in Area: 9.1% % Reduction in Volume: -81.5% Epithelialization: None Tunneling: No Undermining: Yes Starting Position (o'clock): 9 Ending Position (o'clock): 6 Maximum Distance: (cm) 0.5 Wound Description Classification: Category/Stage III Wound Margin: Well defined, not attached Exudate Amount:  Medium Exudate Type: Serosanguineous Exudate Color: red, brown Foul Odor After Cleansing: No Slough/Fibrino Yes Wound Bed Granulation Amount: Large (67-100%) Exposed Structure Granulation Quality: Red, Hyper-granulation Fascia Exposed: No Necrotic Amount: None Present (0%) Fat Layer (Subcutaneous Tissue) Exposed: Yes Tendon Exposed: No Muscle Exposed: No Joint Exposed: No Bone Exposed: No Treatment Notes Wound #1 (Foot) Wound Laterality: Left, Medial Cleanser Soap and Water Discharge Instruction: May shower and wash wound with dial antibacterial soap and water prior to dressing change. Wound Cleanser Discharge Instruction: Cleanse the wound with wound cleanser prior to applying [name] clean dressing using gauze sponges, not tissue or cotton balls. Peri-Wound Care Sween Lotion (Moisturizing lotion) Discharge Instruction: Apply moisturizing lotion as directed Topical Primary Dressing Promogran Prisma Matrix, 4.34 (sq in) (silver collagen) Discharge Instruction: Moisten collagen with saline or hydrogel Optifoam Non-Adhesive Dressing, 4x4 in Discharge Instruction: Apply to wound bed as instructed Secondary Dressing Woven Gauze Sponge, Non-Sterile 4x4 in Discharge Instruction: Apply over primary dressing as directed. Secured With Elastic Bandage 4 inch (ACE bandage) Discharge Instruction: Secure with ACE bandage as directed. Kerlix Roll Sterile, 4.5x3.1 (in/yd) Discharge Instruction: Secure with Kerlix as directed. Paper Tape, 2x10 (in/yd) Discharge Instruction: Secure dressing with tape as directed. Compression Wrap Compression Stockings Add-Ons Electronic Signature(s) Signed: 09/05/2021 12:43:38 PM By: Sharyn Creamer RN, BSN Signed: 09/05/2021 5:00:10 PM By: Adline Peals Entered By: Sharyn Creamer on 09/04/2021 09:36:43 -------------------------------------------------------------------------------- Vitals Details Patient Name: Date of Service: [name], [name]  09/04/2021 9:15 [name] Medical Record Number: [id number] Patient Account Number: [id number] Date of Birth/Sex: Treating RN: [birth year] (88 y.o. [name] Primary Care Ricard Faulkner: Dettinger, Vonna Kotyk Other Clinician: Referring Hannah Strader: Treating Myrah Strawderman/Extender: Fredirick Maudlin Dettinger, [name] in Treatment: 2 Vital Signs Time Taken: 09:22 Temperature (F): 98.0 Height (in): 60 Pulse (bpm): 54 Weight (lbs): 119 Respiratory Rate (breaths/min): 18 Body Mass Index (BMI): 23.2 Blood Pressure (mmHg): 144/62 Reference Range: 80 - 120 mg / dl Electronic Signature(s) Signed: 09/05/2021 8:19:25  AM By: Sandre Kitty Entered By: Sandre Kitty on 09/04/2021 09:22:30

## 2021-09-06 NOTE — Progress Notes (Signed)
[name], [name] ([id number]) Visit Report for 09/04/2021 Chief Complaint Document Details Patient Name: Date of Service: [name], [name] 09/04/2021 9:15 [name] M Medical Record Number: [id number] Patient Account Number: [id number] Date of Birth/Sex: Treating RN: 08/17/1932 (88 y.o. [name] Primary Care Provider: Dettinger, Vonna Kotyk Other Clinician: Referring Provider: Treating Provider/Extender: Fredirick Maudlin Dettinger, [name] in Treatment: 2 Information Obtained from: Patient Chief Complaint Patient is at the clinic for treatment of an open ulcer on her left medial first metatarsal head, secondary to friction and pressure. Electronic Signature(s) Signed: 09/04/2021 9:59:45 AM By: Fredirick Maudlin MD FACS Entered By: Fredirick Maudlin on 09/04/2021 09:59:45 -------------------------------------------------------------------------------- HPI Details Patient Name: Date of Service: [name], [name] 09/04/2021 9:15 [name] M Medical Record Number: [id number] Patient Account Number: [id number] Date of Birth/Sex: Treating RN: 10/23/1932 (88 y.o. [name] Primary Care Provider: Dettinger, Vonna Kotyk Other Clinician: Referring Provider: Treating Provider/Extender: Fredirick Maudlin Dettinger, [name] in Treatment: 2 History of Present Illness HPI Description: ADMISSION 08/20/2021 This is an 88 year old Spanish-speaking patient who is being evaluated for an ulcer on the medial aspect of the left first metatarsal head. She says that in February, her shoes were rubbing against [name] bunion in this location and she first developed [name] blister that ultimately broke down to an ulcer. She is not [name] diabetic and she does not smoke. She does have some high blood pressure as well as rheumatoid arthritis. ABI in clinic was noncompressible. When the wound first appeared, she was seen in urgent care and was started on Bactrim. Her PCP followed up and recommended daily Xeroform gauze dressing  changes. Initially, it sounds like the wound was quite painful but it is now quite improved. Apparently she had been changing the dressing twice [name] day at [name] minimum, and per her PCP's note she may be scrubbing it quite aggressively up to 4 times [name] day. Today, the wound is clean, but there is perimeter of undermining. I am not entirely sure that I am probing bone but we are certainly very close to it. The surface is [name] little bit pale but otherwise healthy. No odor or concern for infection. 08/27/2021: The wound is about the same today. There is [name] little bit more undermining underneath the callus that is formed around the perimeter. The wound surface is pink and moist. No odor or significant drainage. 09/04/2021: The wound is the same size today. It is very clean and the degree of undermining is about the same. The surface is [name] little bit hypertrophic. We have been using silver collagen and the patient's son reports that they have been making [name] concerted effort to tuck the dressing into the undermined area of tissue. No concern for infection. Electronic Signature(s) Signed: 09/04/2021 10:01:07 AM By: Fredirick Maudlin MD FACS Entered By: Fredirick Maudlin on 09/04/2021 10:01:07 -------------------------------------------------------------------------------- Chemical Cauterization Details Patient Name: Date of Service: [name], [name] 09/04/2021 9:15 [name] M Medical Record Number: [id number] Patient Account Number: [id number] Date of Birth/Sex: Treating RN: Mar 06, 1933 (88 y.o. [name] Primary Care Provider: Dettinger, Vonna Kotyk Other Clinician: Referring Provider: Treating Provider/Extender: Fredirick Maudlin Dettinger, [name] in Treatment: 2 Procedure Performed for: Wound #1 Left,Medial Foot Performed By: Physician Fredirick Maudlin, MD Post Procedure Diagnosis Same as Pre-procedure Notes Silver nitrate Electronic Signature(s) Signed: 09/04/2021 10:08:46 AM By: Fredirick Maudlin MD  FACS Signed: 09/05/2021 12:43:38 PM By: Sharyn Creamer RN, BSN Entered By: Sharyn Creamer on 09/04/2021 09:56:32 -------------------------------------------------------------------------------- Physical Exam Details Patient Name: Date of Service:  [name], [name] 09/04/2021 9:15 [name] M Medical Record Number: [id number] Patient Account Number: [id number] Date of Birth/Sex: Treating RN: 1932/12/31 (88 y.o. [name] Primary Care Provider: Dettinger, Vonna Kotyk Other Clinician: Referring Provider: Treating Provider/Extender: Fredirick Maudlin Dettinger, [name] in Treatment: 2 Constitutional . Bradycardic, asymptomatic.. . . No acute distress. Respiratory Normal work of breathing on room air. Notes 09/04/2021: The wound is the same size today. It is very clean and the degree of undermining is about the same. The surface is [name] little bit hypertrophic. No concern for infection. Electronic Signature(s) Signed: 09/04/2021 10:02:05 AM By: Fredirick Maudlin MD FACS Entered By: Fredirick Maudlin on 09/04/2021 10:02:04 -------------------------------------------------------------------------------- Physician Orders Details Patient Name: Date of Service: [name], [name] 09/04/2021 9:15 [name] M Medical Record Number: [id number] Patient Account Number: [id number] Date of Birth/Sex: Treating RN: 08-Mar-1933 (88 y.o. [name] Primary Care Provider: Dettinger, Vonna Kotyk Other Clinician: Referring Provider: Treating Provider/Extender: Fredirick Maudlin Dettinger, [name] in Treatment: 2 Verbal / Phone Orders: No Diagnosis Coding ICD-10 Coding Code Description [phone] Non-pressure chronic ulcer of other part of left foot with bone involvement without evidence of necrosis Follow-up Appointments ppointment in 1 week. - Dr. Celine Ahr Room 2 Return [name] Bathing/ Shower/ Hygiene May shower and wash wound with soap and water. - with dressing changes Off-Loading Wound #1 Left,Medial  Foot Open toe surgical shoe to: - L foot Wound Treatment Wound #1 - Foot Wound Laterality: Left, Medial Cleanser: Soap and Water 1 x Per MHD/62 Days Discharge Instructions: May shower and wash wound with dial antibacterial soap and water prior to dressing change. Cleanser: Wound Cleanser 1 x Per Day/15 Days Discharge Instructions: Cleanse the wound with wound cleanser prior to applying [name] clean dressing using gauze sponges, not tissue or cotton balls. Peri-Wound Care: Sween Lotion (Moisturizing lotion) 1 x Per Day/15 Days Discharge Instructions: Apply moisturizing lotion as directed Prim Dressing: Promogran Prisma Matrix, 4.34 (sq in) (silver collagen) (DME) (Generic) 1 x Per Day/15 Days ary Discharge Instructions: Moisten collagen with saline or hydrogel Prim Dressing: Optifoam Non-Adhesive Dressing, 4x4 in (DME) (Generic) 1 x Per Day/15 Days ary Discharge Instructions: Apply to wound bed as instructed Secondary Dressing: Woven Gauze Sponge, Non-Sterile 4x4 in (DME) (Generic) 1 x Per Day/15 Days Discharge Instructions: Apply over primary dressing as directed. Secured With: Elastic Bandage 4 inch (ACE bandage) (DME) (Generic) 1 x Per Day/15 Days Discharge Instructions: Secure with ACE bandage as directed. Secured With: [employer], 4.5x3.1 (in/yd) (DME) (Generic) 1 x Per Day/15 Days Discharge Instructions: Secure with Kerlix as directed. Secured With: Paper Tape, 2x10 (in/yd) (DME) (Generic) 1 x Per Day/15 Days Discharge Instructions: Secure dressing with tape as directed. Electronic Signature(s) Signed: 09/04/2021 10:08:46 AM By: Fredirick Maudlin MD FACS Entered By: Fredirick Maudlin on 09/04/2021 10:02:19 -------------------------------------------------------------------------------- Problem List Details Patient Name: Date of Service: [name], Jacqueleen 09/04/2021 9:15 [name] M Medical Record Number: [id number] Patient Account Number: [id number] Date of Birth/Sex: Treating  RN: December 05, 1932 (88 y.o. [name] Primary Care Provider: Dettinger, Vonna Kotyk Other Clinician: Referring Provider: Treating Provider/Extender: Fredirick Maudlin Dettinger, [name] in Treatment: 2 Active Problems ICD-10 Encounter Code Description Active Date MDM Diagnosis L97.526 Non-pressure chronic ulcer of other part of left foot with bone involvement 08/20/2021 No Yes without evidence of necrosis Inactive Problems Resolved Problems Electronic Signature(s) Signed: 09/04/2021 9:59:03 AM By: Fredirick Maudlin MD FACS Entered By: Fredirick Maudlin on 09/04/2021 09:59:02 -------------------------------------------------------------------------------- Progress Note Details Patient Name: Date of Service: [name]  LV [name], Devine 09/04/2021 9:15 [name] M Medical Record Number: [id number] Patient Account Number: [id number] Date of Birth/Sex: Treating RN: [birth year] (88 y.o. [name] Primary Care Provider: Dettinger, Vonna Kotyk Other Clinician: Referring Provider: Treating Provider/Extender: Fredirick Maudlin Dettinger, [name] in Treatment: 2 Subjective Chief Complaint Information obtained from Patient Patient is at the clinic for treatment of an open ulcer on her left medial first metatarsal head, secondary to friction and pressure. History of Present Illness (HPI) ADMISSION 08/20/2021 This is an 88 year old Spanish-speaking patient who is being evaluated for an ulcer on the medial aspect of the left first metatarsal head. She says that in February, her shoes were rubbing against [name] bunion in this location and she first developed [name] blister that ultimately broke down to an ulcer. She is not [name] diabetic and she does not smoke. She does have some high blood pressure as well as rheumatoid arthritis. ABI in clinic was noncompressible. When the wound first appeared, she was seen in urgent care and was started on Bactrim. Her PCP followed up and recommended daily Xeroform gauze  dressing changes. Initially, it sounds like the wound was quite painful but it is now quite improved. Apparently she had been changing the dressing twice [name] day at [name] minimum, and per her PCP's note she may be scrubbing it quite aggressively up to 4 times [name] day. Today, the wound is clean, but there is perimeter of undermining. I am not entirely sure that I am probing bone but we are certainly very close to it. The surface is [name] little bit pale but otherwise healthy. No odor or concern for infection. 08/27/2021: The wound is about the same today. There is [name] little bit more undermining underneath the callus that is formed around the perimeter. The wound surface is pink and moist. No odor or significant drainage. 09/04/2021: The wound is the same size today. It is very clean and the degree of undermining is about the same. The surface is [name] little bit hypertrophic. We have been using silver collagen and the patient's son reports that they have been making [name] concerted effort to tuck the dressing into the undermined area of tissue. No concern for infection. Patient History Information obtained from Patient. Family History Cancer - Siblings, No family history of Diabetes, Heart Disease, Hereditary Spherocytosis, Hypertension, Kidney Disease, Lung Disease, Seizures, Stroke, Thyroid Problems, Tuberculosis. Social History Never smoker, Marital Status - Married, Alcohol Use - Never, Drug Use - No History, Caffeine Use - Daily. Medical History Eyes Patient has history of Cataracts Cardiovascular Patient has history of Hypertension Musculoskeletal Patient has history of Rheumatoid Arthritis - hands and feet Hospitalization/Surgery History - cataract surgery. Medical [name] Surgical History Notes nd Eyes macular degeneration Ear/Nose/Mouth/Throat GERD, dysarthria Hematologic/Lymphatic dyslipidemia Cardiovascular palpitations Musculoskeletal osteoporosis, unsteady  gait Neurologic TIA Objective Constitutional Bradycardic, asymptomatic.Marland Kitchen No acute distress. Vitals Time Taken: 9:22 AM, Height: 60 in, Weight: 119 lbs, BMI: 23.2, Temperature: 98.0 F, Pulse: 54 bpm, Respiratory Rate: 18 breaths/min, Blood Pressure: 144/62 mmHg. Respiratory Normal work of breathing on room air. General Notes: 09/04/2021: The wound is the same size today. It is very clean and the degree of undermining is about the same. The surface is [name] little bit hypertrophic. No concern for infection. Integumentary (Hair, Skin) Wound #1 status is Open. Original cause of wound was Gradually Appeared. The date acquired was: 05/22/2021. The wound has been in treatment 2 weeks. The wound is located on the Left,Medial Foot. The wound measures 2cm length x 2cm  width x 0.2cm depth; 3.142cm^2 area and 0.628cm^3 volume. There is Fat Layer (Subcutaneous Tissue) exposed. There is no tunneling noted, however, there is undermining starting at 9:00 and ending at 6:00 with [name] maximum distance of 0.5cm. There is [name] medium amount of serosanguineous drainage noted. The wound margin is well defined and not attached to the wound base. There is large (67-100%) red, hyper - granulation within the wound bed. There is no necrotic tissue within the wound bed. Assessment Active Problems ICD-10 Non-pressure chronic ulcer of other part of left foot with bone involvement without evidence of necrosis Procedures Wound #1 Pre-procedure diagnosis of Wound #1 is [name] Pressure Ulcer located on the Left,Medial Foot . An Chemical Cauterization procedure was performed by Fredirick Maudlin, MD. Post procedure Diagnosis Wound #1: Same as Pre-Procedure Notes: Silver nitrate Plan Follow-up Appointments: Return Appointment in 1 week. - Dr. Celine Ahr Room 2 Bathing/ Shower/ Hygiene: May shower and wash wound with soap and water. - with dressing changes Off-Loading: Wound #1 Left,Medial Foot: Open toe surgical shoe to: - L foot WOUND  #1: - Foot Wound Laterality: Left, Medial Cleanser: Soap and Water 1 x Per POE/42 Days Discharge Instructions: May shower and wash wound with dial antibacterial soap and water prior to dressing change. Cleanser: Wound Cleanser 1 x Per Day/15 Days Discharge Instructions: Cleanse the wound with wound cleanser prior to applying [name] clean dressing using gauze sponges, not tissue or cotton balls. Peri-Wound Care: Sween Lotion (Moisturizing lotion) 1 x Per Day/15 Days Discharge Instructions: Apply moisturizing lotion as directed Prim Dressing: Promogran Prisma Matrix, 4.34 (sq in) (silver collagen) (DME) (Generic) 1 x Per Day/15 Days ary Discharge Instructions: Moisten collagen with saline or hydrogel Prim Dressing: Optifoam Non-Adhesive Dressing, 4x4 in (DME) (Generic) 1 x Per Day/15 Days ary Discharge Instructions: Apply to wound bed as instructed Secondary Dressing: Woven Gauze Sponge, Non-Sterile 4x4 in (DME) (Generic) 1 x Per Day/15 Days Discharge Instructions: Apply over primary dressing as directed. Secured With: Elastic Bandage 4 inch (ACE bandage) (DME) (Generic) 1 x Per Day/15 Days Discharge Instructions: Secure with ACE bandage as directed. Secured With: [employer], 4.5x3.1 (in/yd) (DME) (Generic) 1 x Per Day/15 Days Discharge Instructions: Secure with Kerlix as directed. Secured With: Paper T ape, 2x10 (in/yd) (DME) (Generic) 1 x Per Day/15 Days Discharge Instructions: Secure dressing with tape as directed. 09/04/2021: The wound is the same size today. It is very clean and the degree of undermining is about the same. The surface is [name] little bit hypertrophic. No concern for infection. I used silver nitrate to chemically cauterize the hypertrophic granulation tissue. I think she might benefit from [name] snap VAC system and therefore we will run this through her insurance. This week, we will continue using the Prisma silver collagen but I anticipate that we will apply the [facility] at her next  visit. Follow-up in 1 week's time. Electronic Signature(s) Signed: 09/04/2021 10:03:02 AM By: Fredirick Maudlin MD FACS Entered By: Fredirick Maudlin on 09/04/2021 10:03:01 -------------------------------------------------------------------------------- HxROS Details Patient Name: Date of Service: [name], Shamir 09/04/2021 9:15 [name] M Medical Record Number: [id number] Patient Account Number: [id number] Date of Birth/Sex: Treating RN: [birth year] (88 y.o. [name] Primary Care Provider: Dettinger, Vonna Kotyk Other Clinician: Referring Provider: Treating Provider/Extender: Fredirick Maudlin Dettinger, [name] in Treatment: 2 Information Obtained From Patient Eyes Medical History: Positive for: Cataracts Past Medical History Notes: macular degeneration Ear/Nose/Mouth/Throat Medical History: Past Medical History Notes: GERD, dysarthria Hematologic/Lymphatic Medical History: Past Medical History Notes: dyslipidemia  Cardiovascular Medical History: Positive for: Hypertension Past Medical History Notes: palpitations Musculoskeletal Medical History: Positive for: Rheumatoid Arthritis - hands and feet Past Medical History Notes: osteoporosis, unsteady gait Neurologic Medical History: Past Medical History Notes: TIA HBO Extended History Items Eyes: Cataracts Immunizations Pneumococcal Vaccine: Received Pneumococcal Vaccination: Yes Received Pneumococcal Vaccination On or After 60th Birthday: Yes Implantable Devices None Hospitalization / Surgery History Type of Hospitalization/Surgery cataract surgery Family and Social History Cancer: Yes - Siblings; Diabetes: No; Heart Disease: No; Hereditary Spherocytosis: No; Hypertension: No; Kidney Disease: No; Lung Disease: No; Seizures: No; Stroke: No; Thyroid Problems: No; Tuberculosis: No; Never smoker; Marital Status - Married; Alcohol Use: Never; Drug Use: No History; Caffeine Use: Daily; Financial Concerns: No;  Food, Clothing or Shelter Needs: No; Support System Lacking: No; Transportation Concerns: No Electronic Signature(s) Signed: 09/04/2021 10:08:46 AM By: Fredirick Maudlin MD FACS Signed: 09/05/2021 5:00:10 PM By: Adline Peals Entered By: Fredirick Maudlin on 09/04/2021 10:01:34 -------------------------------------------------------------------------------- SuperBill Details Patient Name: Date of Service: [name], Rudi 09/04/2021 Medical Record Number: [id number] Patient Account Number: [id number] Date of Birth/Sex: Treating RN: [birth year] (88 y.o. [name] Primary Care Provider: Dettinger, Vonna Kotyk Other Clinician: Referring Provider: Treating Provider/Extender: Fredirick Maudlin Dettinger, [name] in Treatment: 2 Diagnosis Coding ICD-10 Codes Code Description [phone] Non-pressure chronic ulcer of other part of left foot with bone involvement without evidence of necrosis Facility Procedures CPT4 Code: [id number] Description: 35686 - CHEM CAUT GRANULATION TISS ICD-10 Diagnosis Description L97.526 Non-pressure chronic ulcer of other part of left foot with bone involvement with Modifier: out evidence of necr Quantity: 1 osis Physician Procedures : CPT4 Code Description Modifier [id number] - WC PHYS LEVEL 3 - EST PT ICD-10 Diagnosis Description L97.526 Non-pressure chronic ulcer of other part of left foot with bone involvement without evidence of necrosis Quantity: 1 : [id number] - WC PHYS CHEM CAUT GRAN TISSUE 1 ICD-10 Diagnosis Description L97.526 Non-pressure chronic ulcer of other part of left foot with bone involvement without evidence of necrosis Quantity: Electronic Signature(s) Signed: 09/04/2021 10:03:16 AM By: Fredirick Maudlin MD FACS Entered By: Fredirick Maudlin on 09/04/2021 10:03:15

## 2021-09-11 ENCOUNTER — Other Ambulatory Visit (HOSPITAL_COMMUNITY): Payer: Self-pay | Admitting: General Surgery

## 2021-09-11 ENCOUNTER — Ambulatory Visit (HOSPITAL_COMMUNITY)
Admission: RE | Admit: 2021-09-11 | Discharge: 2021-09-11 | Disposition: A | Discharge status: Home / Self Care | Payer: Medicare Other | Source: Ambulatory Visit | Attending: General Surgery | Admitting: General Surgery

## 2021-09-11 ENCOUNTER — Encounter (HOSPITAL_BASED_OUTPATIENT_CLINIC_OR_DEPARTMENT_OTHER): Payer: Medicare Other | Admitting: General Surgery

## 2021-09-11 DIAGNOSIS — M7732 Calcaneal spur, left foot: Secondary | ICD-10-CM | POA: Diagnosis not present

## 2021-09-11 DIAGNOSIS — M19072 Primary osteoarthritis, left ankle and foot: Secondary | ICD-10-CM | POA: Diagnosis not present

## 2021-09-11 DIAGNOSIS — L97522 Non-pressure chronic ulcer of other part of left foot with fat layer exposed: Secondary | ICD-10-CM | POA: Diagnosis not present

## 2021-09-11 DIAGNOSIS — S91309D Unspecified open wound, unspecified foot, subsequent encounter: Secondary | ICD-10-CM | POA: Diagnosis not present

## 2021-09-11 DIAGNOSIS — M069 Rheumatoid arthritis, unspecified: Secondary | ICD-10-CM | POA: Diagnosis not present

## 2021-09-11 DIAGNOSIS — L89893 Pressure ulcer of other site, stage 3: Secondary | ICD-10-CM | POA: Diagnosis not present

## 2021-09-11 DIAGNOSIS — S91302A Unspecified open wound, left foot, initial encounter: Secondary | ICD-10-CM | POA: Diagnosis not present

## 2021-09-11 DIAGNOSIS — L97526 Non-pressure chronic ulcer of other part of left foot with bone involvement without evidence of necrosis: Secondary | ICD-10-CM | POA: Diagnosis not present

## 2021-09-11 DIAGNOSIS — E13621 Other specified diabetes mellitus with foot ulcer: Secondary | ICD-10-CM | POA: Diagnosis not present

## 2021-09-11 NOTE — Progress Notes (Signed)
Sick, [name] ([id number]) Visit Report for 09/11/2021 Arrival Information Details Patient Name: Date of Service: [name], [name] 09/11/2021 10:00 A M Medical Record Number: [id number] Patient Account Number: [id number] Date of Birth/Sex: Treating RN: 02/25/1933 (88 y.o. F) Herrington, Taylor Primary Care Provider: Dettinger, Joshua Other Clinician: Referring Provider: Treating Provider/Extender: Cannon, Jennifer Dettinger, Joshua Weeks in Treatment: 3 Visit Information History Since Last Visit Added or deleted any medications: No Patient Arrived: Walker Any new allergies or adverse reactions: No Arrival Time: 09:53 Had a fall or experienced change in No Accompanied By: son and interpreter activities of daily living that may affect Transfer Assistance: None risk of falls: Patient Requires Transmission-Based Precautions: No Signs or symptoms of abuse/neglect since last visito No Hospitalized since last visit: No Implantable device outside of the clinic excluding No cellular tissue based products placed in the center since last visit: Has Dressing in Place as Prescribed: Yes Pain Present Now: Yes Electronic Signature(s) Signed: 09/11/2021 5:55:49 PM By: Herrington, Taylor Entered By: Herrington, Taylor on 09/11/2021 09:58:18 -------------------------------------------------------------------------------- Encounter Discharge Information Details Patient Name: Date of Service: [name], [name] 09/11/2021 10:00 A M Medical Record Number: [id number] Patient Account Number: [id number] Date of Birth/Sex: Treating RN: 03/15/1933 (88 y.o. F) Herrington, Taylor Primary Care Provider: Dettinger, Joshua Other Clinician: Referring Provider: Treating Provider/Extender: Cannon, Jennifer Dettinger, Joshua Weeks in Treatment: 3 Encounter Discharge Information Items Post Procedure Vitals Discharge Condition: Stable Temperature (F): 98 Ambulatory Status: Walker Pulse (bpm): 71 Discharge  Destination: Home Respiratory Rate (breaths/min): 18 Transportation: Private Auto Blood Pressure (mmHg): 163/74 Accompanied By: son and interpreter Schedule Follow-up Appointment: Yes Clinical Summary of Care: Patient Declined Electronic Signature(s) Signed: 09/11/2021 5:55:49 PM By: Herrington, Taylor Entered By: Herrington, Taylor on 09/11/2021 10:50:23 -------------------------------------------------------------------------------- Lower Extremity Assessment Details Patient Name: Date of Service: [name], [name] 09/11/2021 10:00 A M Medical Record Number: [id number] Patient Account Number: [id number] Date of Birth/Sex: Treating RN: 11/20/1932 (88 y.o. F) Herrington, Taylor Primary Care Provider: Dettinger, Joshua Other Clinician: Referring Provider: Treating Provider/Extender: Cannon, Jennifer Dettinger, Joshua Weeks in Treatment: 3 Edema Assessment Assessed: [Left: No] [Right: No] E[Left: dema] [Right: :] Calf Left: Right: Point of Measurement: 33 cm From Medial Instep 30.1 cm Ankle Left: Right: Point of Measurement: 9 cm From Medial Instep 21.1 cm Vascular Assessment Pulses: Dorsalis Pedis Palpable: [Left:Yes] Electronic Signature(s) Signed: 09/11/2021 5:55:49 PM By: Herrington, Taylor Entered By: Herrington, Taylor on 09/11/2021 10:01:31 -------------------------------------------------------------------------------- Multi Wound Chart Details Patient Name: Date of Service: [name], [name] 09/11/2021 10:00 A M Medical Record Number: [id number] Patient Account Number: [id number] Date of Birth/Sex: Treating RN: 03/04/1933 (88 y.o. F) Herrington, Taylor Primary Care Provider: Dettinger, Joshua Other Clinician: Referring Provider: Treating Provider/Extender: Cannon, Jennifer Dettinger, Joshua Weeks in Treatment: 3 Vital Signs Height(in): 60 Pulse(bpm): 71 Weight(lbs): 119 Blood Pressure(mmHg): 163/74 Body Mass Index(BMI): 23.2 Temperature(°F):  98 Respiratory Rate(breaths/min): 18 Photos: [1:Left, Medial Foot] [N/A:N/A N/A] Wound Location: [1:Gradually Appeared] [N/A:N/A] Wounding Event: [1:Pressure Ulcer] [N/A:N/A] Primary Etiology: [1:Cataracts, Hypertension, Rheumatoid N/A] Comorbid History: [1:Arthritis 05/22/2021] [N/A:N/A] Date Acquired: [1:3] [N/A:N/A] Weeks of Treatment: [1:Open] [N/A:N/A] Wound Status: [1:No] [N/A:N/A] Wound Recurrence: [1:1.8x1.9x0.2] [N/A:N/A] Measurements L x W x D (cm) [1:2.686] [N/A:N/A] A (cm) : rea [1:0.537] [N/A:N/A] Volume (cm) : [1:22.30%] [N/A:N/A] % Reduction in A [1:rea: -55.20%] [N/A:N/A] % Reduction in Volume: [1:1] Starting Position 1 (o'clock): [1:4] Ending Position 1 (o'clock): [1:0.2] Maximum Distance 1 (cm): [1:Yes] [N/A:N/A] Undermining: [1:Category/Stage III] [N/A:N/A] Classification: [1:Medium] [N/A:N/A] Exudate A mount: [1:Serosanguineous] [N/A:N/A] Exudate   Type: [1:red, brown] [N/A:N/A] Exudate Color: [1:Well defined, not attached] [N/A:N/A] Wound Margin: [1:Large (67-100%)] [N/A:N/A] Granulation A mount: [1:Red, Hyper-granulation] [N/A:N/A] Granulation Quality: [1:Small (1-33%)] [N/A:N/A] Necrotic A mount: [1:Fat Layer (Subcutaneous Tissue): Yes N/A] Exposed Structures: [1:Fascia: No Tendon: No Muscle: No Joint: No Bone: No None] [N/A:N/A] Epithelialization: [1:Debridement - Selective/Open Wound N/A] Debridement: Pre-procedure Verification/Time Out 10:17 [N/A:N/A] Taken: [1:Lidocaine 4% Topical Solution] [N/A:N/A] Pain Control: [1:Slough] [N/A:N/A] Tissue Debrided: [1:Non-Viable Tissue] [N/A:N/A] Level: [1:3.04] [N/A:N/A] Debridement A (sq cm): [1:rea Curette] [N/A:N/A] Instrument: [1:Minimum] [N/A:N/A] Bleeding: [1:Pressure] [N/A:N/A] Hemostasis A chieved: [1:8] [N/A:N/A] Procedural Pain: [1:2] [N/A:N/A] Post Procedural Pain: [1:Procedure was tolerated well] [N/A:N/A] Debridement Treatment Response: [1:1.8x1.9x0.2] [N/A:N/A] Post Debridement Measurements L  x W x D (cm) [1:0.537] [N/A:N/A] Post Debridement Volume: (cm) [1:Category/Stage III] [N/A:N/A] Post Debridement Stage: [1:Debridement] [N/A:N/A] Treatment Notes Wound #1 (Foot) Wound Laterality: Left, Medial Cleanser Soap and Water Discharge Instruction: May shower and wash wound with dial antibacterial soap and water prior to dressing change. Wound Cleanser Discharge Instruction: Cleanse the wound with wound cleanser prior to applying a clean dressing using gauze sponges, not tissue or cotton balls. Peri-Wound Care Sween Lotion (Moisturizing lotion) Discharge Instruction: Apply moisturizing lotion as directed Topical Primary Dressing Promogran Prisma Matrix, 4.34 (sq in) (silver collagen) Discharge Instruction: Moisten collagen with saline or hydrogel Optifoam Non-Adhesive Dressing, 4x4 in Discharge Instruction: Apply to wound bed as instructed Secondary Dressing Woven Gauze Sponge, Non-Sterile 4x4 in Discharge Instruction: Apply over primary dressing as directed. Secured With Elastic Bandage 4 inch (ACE bandage) Discharge Instruction: Secure with ACE bandage as directed. Kerlix Roll Sterile, 4.5x3.1 (in/yd) Discharge Instruction: Secure with Kerlix as directed. Paper Tape, 2x10 (in/yd) Discharge Instruction: Secure dressing with tape as directed. Compression Wrap Compression Stockings Add-Ons Electronic Signature(s) Signed: 09/11/2021 11:38:46 AM By: Cannon, Jennifer MD FACS Signed: 09/11/2021 5:55:49 PM By: Herrington, Taylor Entered By: Cannon, Jennifer on 09/11/2021 11:38:46 -------------------------------------------------------------------------------- Multi-Disciplinary Care Plan Details Patient Name: Date of Service: [name], [name] 09/11/2021 10:00 A M Medical Record Number: [id number] Patient Account Number: [id number] Date of Birth/Sex: Treating RN: 09/22/1932 (88 y.o. F) Herrington, Taylor Primary Care Provider: Dettinger, Joshua Other Clinician: Referring  Provider: Treating Provider/Extender: Cannon, Jennifer Dettinger, Joshua Weeks in Treatment: 3 Active Inactive Abuse / Safety / Falls / Self Care Management Nursing Diagnoses: Impaired physical mobility Potential for falls Potential for injury related to falls Goals: Patient will remain injury free related to falls Date Initiated: 08/20/2021 Target Resolution Date: 10/11/2021 Goal Status: Active Patient/caregiver will demonstrate safe use of adaptive devices to increase mobility Date Initiated: 08/20/2021 Date Inactivated: 09/11/2021 Target Resolution Date: 10/11/2021 Goal Status: Met Patient/caregiver will verbalize understanding of skin care regimen Date Initiated: 08/20/2021 Date Inactivated: 09/11/2021 Target Resolution Date: 10/11/2021 Goal Status: Met Interventions: Assess fall risk on admission and as needed Assess impairment of mobility on admission and as needed per policy Provide education on fall prevention Notes: Wound/Skin Impairment Nursing Diagnoses: Impaired tissue integrity Knowledge deficit related to ulceration/compromised skin integrity Goals: Patient/caregiver will verbalize understanding of skin care regimen Date Initiated: 08/20/2021 Date Inactivated: 09/11/2021 Target Resolution Date: 09/10/2021 Goal Status: Met Ulcer/skin breakdown will have a volume reduction of 30% by week 4 Date Initiated: 08/20/2021 Target Resolution Date: 10/11/2021 Goal Status: Active Interventions: Assess patient/caregiver ability to perform ulcer/skin care regimen upon admission and as needed Assess ulceration(s) every visit Treatment Activities: Referred to DME provider for dressing supplies : 08/20/2021 Skin care regimen initiated : 08/20/2021 Topical wound management initiated : 08/20/2021 Notes: Electronic Signature(s) Signed:   09/11/2021 5:55:49 PM By: Adline Peals Entered By: Adline Peals on 09/11/2021  10:09:21 -------------------------------------------------------------------------------- Pain Assessment Details Patient Name: Date of Service: [name], [name] 09/11/2021 10:00 A M Medical Record Number: [id number] Patient Account Number: [id number] Date of Birth/Sex: Treating RN: Sep 13, 1932 (88 y.o. [name] Primary Care Lilinoe Acklin: Dettinger, Vonna Kotyk Other Clinician: Referring Brenn Deziel: Treating Hildreth Robart/Extender: Fredirick Maudlin Dettinger, [name] in Treatment: 3 Active Problems Location of Pain Severity and Description of Pain Patient Has Paino Yes Site Locations Pain Location: Pain in Ulcers Duration of the Pain. Constant / Intermittento Intermittent Rate the pain. Current Pain Level: 7 Character of Pain Describe the Pain: Throbbing Pain Management and Medication Current Pain Management: Medication: Yes How does your wound impact your activities of daily livingo Sleep: Yes Electronic Signature(s) Signed: 09/11/2021 5:55:49 PM By: Adline Peals Entered By: Adline Peals on 09/11/2021 09:59:28 -------------------------------------------------------------------------------- Patient/Caregiver Education Details Patient Name: Date of Service: [name], [name] 5/24/2023andnbsp10:00 A M Medical Record Number: [id number] Patient Account Number: [id number] Date of Birth/Gender: Treating RN: [birth year] (88 y.o. [name] Primary Care Physician: Dettinger, Vonna Kotyk Other Clinician: Referring Physician: Treating Physician/Extender: Fredirick Maudlin Dettinger, [name] in Treatment: 3 Education Assessment Education Provided To: Patient Education Topics Provided Wound/Skin Impairment: Methods: Explain/Verbal Responses: Reinforcements needed, State content correctly Electronic Signature(s) Signed: 09/11/2021 5:55:49 PM By: Adline Peals Entered By: Adline Peals on 09/11/2021  10:09:32 -------------------------------------------------------------------------------- Wound Assessment Details Patient Name: Date of Service: [name], [name] 09/11/2021 10:00 A M Medical Record Number: [id number] Patient Account Number: [id number] Date of Birth/Sex: Treating RN: 11/09/1932 (88 y.o. [name] Primary Care Chane Magner: Dettinger, Vonna Kotyk Other Clinician: Referring Guyla Bless: Treating Miliani Deike/Extender: Fredirick Maudlin Dettinger, [name] in Treatment: 3 Wound Status Wound Number: 1 Primary Etiology: Pressure Ulcer Wound Location: Left, Medial Foot Wound Status: Open Wounding Event: Gradually Appeared Comorbid History: Cataracts, Hypertension, Rheumatoid Arthritis Date Acquired: 05/22/2021 Weeks Of Treatment: 3 Clustered Wound: No Photos Wound Measurements Length: (cm) 1.8 Width: (cm) 1.9 Depth: (cm) 0.2 Area: (cm) 2.686 Volume: (cm) 0.537 % Reduction in Area: 22.3% % Reduction in Volume: -55.2% Epithelialization: None Tunneling: No Undermining: Yes Starting Position (o'clock): 1 Ending Position (o'clock): 4 Maximum Distance: (cm) 0.2 Wound Description Classification: Category/Stage III Wound Margin: Well defined, not attached Exudate Amount: Medium Exudate Type: Serosanguineous Exudate Color: red, brown Foul Odor After Cleansing: No Slough/Fibrino Yes Wound Bed Granulation Amount: Large (67-100%) Exposed Structure Granulation Quality: Red, Hyper-granulation Fascia Exposed: No Necrotic Amount: Small (1-33%) Fat Layer (Subcutaneous Tissue) Exposed: Yes Necrotic Quality: Adherent Slough Tendon Exposed: No Muscle Exposed: No Joint Exposed: No Bone Exposed: No Treatment Notes Wound #1 (Foot) Wound Laterality: Left, Medial Cleanser Soap and Water Discharge Instruction: May shower and wash wound with dial antibacterial soap and water prior to dressing change. Wound Cleanser Discharge Instruction: Cleanse the wound with wound  cleanser prior to applying a clean dressing using gauze sponges, not tissue or cotton balls. Peri-Wound Care Sween Lotion (Moisturizing lotion) Discharge Instruction: Apply moisturizing lotion as directed Topical Primary Dressing Promogran Prisma Matrix, 4.34 (sq in) (silver collagen) Discharge Instruction: Moisten collagen with saline or hydrogel Optifoam Non-Adhesive Dressing, 4x4 in Discharge Instruction: Apply to wound bed as instructed Secondary Dressing Woven Gauze Sponge, Non-Sterile 4x4 in Discharge Instruction: Apply over primary dressing as directed. Secured With Elastic Bandage 4 inch (ACE bandage) Discharge Instruction: Secure with ACE bandage as directed. Kerlix Roll Sterile, 4.5x3.1 (in/yd) Discharge Instruction: Secure with Kerlix as directed. Paper Tape, 2x10 (in/yd) Discharge Instruction: Secure  dressing with tape as directed. Compression Wrap Compression Stockings Add-Ons Electronic Signature(s) Signed: 09/11/2021 5:55:49 PM By: Herrington, Taylor Entered By: Herrington, Taylor on 09/11/2021 10:16:20 -------------------------------------------------------------------------------- Vitals Details Patient Name: Date of Service: [name], [name] 09/11/2021 10:00 A M Medical Record Number: [id number] Patient Account Number: [id number] Date of Birth/Sex: Treating RN: 06/20/1932 (88 y.o. F) Herrington, Taylor Primary Care Provider: Dettinger, Joshua Other Clinician: Referring Provider: Treating Provider/Extender: Cannon, Jennifer Dettinger, Joshua Weeks in Treatment: 3 Vital Signs Time Taken: 09:58 Temperature (°F): 98 Height (in): 60 Pulse (bpm): 71 Weight (lbs): 119 Respiratory Rate (breaths/min): 18 Body Mass Index (BMI): 23.2 Blood Pressure (mmHg): 163/74 Reference Range: 80 - 120 mg / dl Electronic Signature(s) Signed: 09/11/2021 5:55:49 PM By: Herrington, Taylor Entered By: Herrington, Taylor on 09/11/2021 09:58:44 

## 2021-09-11 NOTE — Progress Notes (Addendum)
BETHENE, [name] ([id number]) Visit Report for 09/11/2021 Chief Complaint Document Details Patient Name: Date of Service: [name], [name] 09/11/2021 10:00 A M Medical Record Number: [id number] Patient Account Number: [id number] Date of Birth/Sex: Treating RN: [birth year] (88 y.o. [name] Primary Care Provider: Dettinger, Vonna Kotyk Other Clinician: Referring Provider: Treating Provider/Extender: Fredirick Maudlin Dettinger, [name] in Treatment: 3 Information Obtained from: Patient Chief Complaint Patient is at the clinic for treatment of an open ulcer on her left medial first metatarsal head, secondary to friction and pressure. Electronic Signature(s) Signed: 09/11/2021 11:38:52 AM By: Fredirick Maudlin MD FACS Entered By: Fredirick Maudlin on 09/11/2021 11:38:52 -------------------------------------------------------------------------------- Debridement Details Patient Name: Date of Service: [name], [name] 09/11/2021 10:00 A M Medical Record Number: [id number] Patient Account Number: [id number] Date of Birth/Sex: Treating RN: [birth year] (88 y.o. [name] Primary Care Provider: Dettinger, Vonna Kotyk Other Clinician: Referring Provider: Treating Provider/Extender: Fredirick Maudlin Dettinger, [name] in Treatment: 3 Debridement Performed for Assessment: Wound #1 Left,Medial Foot Performed By: Physician Fredirick Maudlin, MD Debridement Type: Debridement Level of Consciousness (Pre-procedure): Awake and Alert Pre-procedure Verification/Time Out Yes - 10:17 Taken: Start Time: 10:17 Pain Control: Lidocaine 4% T opical Solution T Area Debrided (L x W): otal 1.6 (cm) x 1.9 (cm) = 3.04 (cm) Tissue and other material debrided: Non-Viable, Slough, Slough Level: Non-Viable Tissue Debridement Description: Selective/Open Wound Instrument: Curette Bleeding: Minimum Hemostasis Achieved: Pressure Procedural Pain: 8 Post Procedural Pain: 2 Response to Treatment:  Procedure was tolerated well Level of Consciousness (Post- Awake and Alert procedure): Post Debridement Measurements of Total Wound Length: (cm) 1.8 Stage: Category/Stage III Width: (cm) 1.9 Depth: (cm) 0.2 Volume: (cm) 0.537 Character of Wound/Ulcer Post Debridement: Improved Post Procedure Diagnosis Same as Pre-procedure Electronic Signature(s) Signed: 09/11/2021 11:45:05 AM By: Fredirick Maudlin MD FACS Signed: 09/11/2021 5:55:49 PM By: Adline Peals Entered By: Adline Peals on 09/11/2021 10:18:34 -------------------------------------------------------------------------------- HPI Details Patient Name: Date of Service: [name], [name] 09/11/2021 10:00 A M Medical Record Number: [id number] Patient Account Number: [id number] Date of Birth/Sex: Treating RN: [birth year] (88 y.o. [name] Primary Care Provider: Dettinger, Vonna Kotyk Other Clinician: Referring Provider: Treating Provider/Extender: Fredirick Maudlin Dettinger, [name] in Treatment: 3 History of Present Illness HPI Description: ADMISSION 08/20/2021 This is an 88 year old Spanish-speaking patient who is being evaluated for an ulcer on the medial aspect of the left first metatarsal head. She says that in February, her shoes were rubbing against a bunion in this location and she first developed a blister that ultimately broke down to an ulcer. She is not a diabetic and she does not smoke. She does have some high blood pressure as well as rheumatoid arthritis. ABI in clinic was noncompressible. When the wound first appeared, she was seen in urgent care and was started on Bactrim. Her PCP followed up and recommended daily Xeroform gauze dressing changes. Initially, it sounds like the wound was quite painful but it is now quite improved. Apparently she had been changing the dressing twice a day at a minimum, and per her PCP's note she may be scrubbing it quite aggressively up to 4 times a day. Today, the  wound is clean, but there is perimeter of undermining. I am not entirely sure that I am probing bone but we are certainly very close to it. The surface is a little bit pale but otherwise healthy. No odor or concern for infection. 08/27/2021: The wound is about the same today. There is a little bit more undermining underneath  the callus that is formed around the perimeter. The wound surface is pink and moist. No odor or significant drainage. 09/04/2021: The wound is the same size today. It is very clean and the degree of undermining is about the same. The surface is a little bit hypertrophic. We have been using silver collagen and the patient's son reports that they have been making a concerted effort to tuck the dressing into the undermined area of tissue. No concern for infection. 09/11/2021: The wound is really unchanged with perhaps a little bit less undermining. It is clean and they have been using silver collagen, taking care to tuck it into the undermined area. I thought we had applied for snap VAC approval last week, but for some reason we are unable to find this. She does say that the degree of pain she is experiencing is less and that it sometimes just throbs at night but is better than previously. Electronic Signature(s) Signed: 09/11/2021 11:39:49 AM By: Fredirick Maudlin MD FACS Entered By: Fredirick Maudlin on 09/11/2021 11:39:49 -------------------------------------------------------------------------------- Physical Exam Details Patient Name: Date of Service: [name], [name] 09/11/2021 10:00 A M Medical Record Number: [id number] Patient Account Number: [id number] Date of Birth/Sex: Treating RN: Dec 22, 1932 (88 y.o. [name] Primary Care Provider: Dettinger, Vonna Kotyk Other Clinician: Referring Provider: Treating Provider/Extender: Fredirick Maudlin Dettinger, [name] in Treatment: 3 Constitutional Slightly hypertensive. . . . No acute distress. Respiratory Normal work  of breathing on room air. Notes 09/11/2021: No significant change aside from perhaps a little bit less undermining. The surface remains clean with a thin layer of biofilm on the surface. Electronic Signature(s) Signed: 09/11/2021 11:41:24 AM By: Fredirick Maudlin MD FACS Entered By: Fredirick Maudlin on 09/11/2021 11:41:24 -------------------------------------------------------------------------------- Physician Orders Details Patient Name: Date of Service: [name], Meyer 09/11/2021 10:00 A M Medical Record Number: [id number] Patient Account Number: [id number] Date of Birth/Sex: Treating RN: September 22, 1932 (88 y.o. [name] Primary Care Provider: Dettinger, Vonna Kotyk Other Clinician: Referring Provider: Treating Provider/Extender: Fredirick Maudlin Dettinger, [name] in Treatment: 3 Verbal / Phone Orders: No Diagnosis Coding ICD-10 Coding Code Description [id number] Non-pressure chronic ulcer of other part of left foot with bone involvement without evidence of necrosis Follow-up Appointments ppointment in 1 week. - Dr. Celine Ahr Room 2 - 5/31 at 9:15 AM Return A Bathing/ Shower/ Hygiene May shower and wash wound with soap and water. - with dressing changes Off-Loading Wound #1 Left,Medial Foot Open toe surgical shoe to: - L foot Wound Treatment Wound #1 - Foot Wound Laterality: Left, Medial Cleanser: Soap and Water 1 x Per GUR/42 Days Discharge Instructions: May shower and wash wound with dial antibacterial soap and water prior to dressing change. Cleanser: Wound Cleanser 1 x Per Day/15 Days Discharge Instructions: Cleanse the wound with wound cleanser prior to applying a clean dressing using gauze sponges, not tissue or cotton balls. Cleanser: Byram Ancillary Kit - 15 Day Supply (DME) (Generic) 1 x Per Day/15 Days Discharge Instructions: Use supplies as instructed; Kit contains: (15) Saline Bullets; (15) 3x3 Gauze; 15 pr Gloves Peri-Wound Care: Sween Lotion (Moisturizing  lotion) 1 x Per Day/15 Days Discharge Instructions: Apply moisturizing lotion as directed Prim Dressing: Promogran [name] Matrix, 4.34 (sq in) (silver collagen) (Generic) 1 x Per Day/15 Days ary Discharge Instructions: Moisten collagen with saline or hydrogel Prim Dressing: Optifoam Non-Adhesive Dressing, 4x4 in (Generic) 1 x Per Day/15 Days ary Discharge Instructions: Apply to wound bed as instructed Secondary Dressing: Woven Gauze Sponge, Non-Sterile 4x4 in (Generic)  1 x Per Day/15 Days Discharge Instructions: Apply over primary dressing as directed. Secured With: Elastic Bandage 4 inch (ACE bandage) (Generic) 1 x Per Day/15 Days Discharge Instructions: Secure with ACE bandage as directed. Secured With: [employer], 4.5x3.1 (in/yd) (Generic) 1 x Per Day/15 Days Discharge Instructions: Secure with Kerlix as directed. Secured With: 66M Medipore H Soft Cloth Surgical T ape, 4 x 10 (in/yd) (DME) (Generic) 1 x Per Day/15 Days Discharge Instructions: Secure with tape as directed. Laboratory naerobe culture (MICRO) - PCR of left medial foot Bacteria identified in Unspecified specimen by A LOINC Code: 322-0 Convenience Name: Anaerobic culture Radiology X-ray, foot left - for non-healing wound to left foot Electronic Signature(s) Signed: 09/17/2021 3:01:24 PM By: Fredirick Maudlin MD FACS Signed: 09/17/2021 4:02:28 PM By: Adline Peals Previous Signature: 09/11/2021 11:45:05 AM Version By: Fredirick Maudlin MD FACS Entered By: Adline Peals on 09/17/2021 12:37:28 Prescription 09/11/2021 -------------------------------------------------------------------------------- Haydee Monica MD Patient Name: Provider: 1932/09/17 [id number] Date of Birth: NPI#Wanda Plump [id number] Sex: DEA #: [phone] Phone #: License #: [place] Patient Address: [phone] [place], [place] [address] [place], [place] [address] [phone] Allergies penicillin G; lisinopril Provider's Orders X-ray, foot left - for non-healing wound to left foot Hand Signature: Date(s): Electronic Signature(s) Signed: 09/17/2021 3:01:24 PM By: Fredirick Maudlin MD FACS Signed: 09/17/2021 4:02:28 PM By: Adline Peals Previous Signature: 09/11/2021 11:44:03 AM Version By: Fredirick Maudlin MD FACS Entered By: Adline Peals on 09/17/2021 12:37:29 -------------------------------------------------------------------------------- Problem List Details Patient Name: Date of Service: [name], [name] 09/11/2021 10:00 A M Medical Record Number: [id number] Patient Account Number: [id number] Date of Birth/Sex: Treating RN: [birth year] (88 y.o. [name] Primary Care Provider: Dettinger, Vonna Kotyk Other Clinician: Referring Provider: Treating Provider/Extender: Fredirick Maudlin Dettinger, [name] in Treatment: 3 Active Problems ICD-10 Encounter Code Description Active Date MDM Diagnosis L97.526 Non-pressure chronic ulcer of other part of left foot with bone involvement 08/20/2021 No Yes without evidence of necrosis Inactive Problems Resolved Problems Electronic Signature(s) Signed: 09/11/2021 11:38:34 AM By: Fredirick Maudlin MD FACS Entered By: Fredirick Maudlin on 09/11/2021 11:38:34 -------------------------------------------------------------------------------- Progress Note Details Patient Name: Date of Service: [name], [name] 09/11/2021 10:00 A M Medical Record Number: [id number] Patient Account Number: [id number] Date of Birth/Sex: Treating RN: 1932/07/23 (88 y.o. [name] Primary Care Provider: Dettinger, Vonna Kotyk Other Clinician: Referring Provider: Treating Provider/Extender: Fredirick Maudlin Dettinger, [name] in Treatment: 3 Subjective Chief Complaint Information obtained from Patient Patient is at the clinic for treatment of an open ulcer on her left  medial first metatarsal head, secondary to friction and pressure. History of Present Illness (HPI) ADMISSION 08/20/2021 This is an 88 year old Spanish-speaking patient who is being evaluated for an ulcer on the medial aspect of the left first metatarsal head. She says that in February, her shoes were rubbing against a bunion in this location and she first developed a blister that ultimately broke down to an ulcer. She is not a diabetic and she does not smoke. She does have some high blood pressure as well as rheumatoid arthritis. ABI in clinic was noncompressible. When the wound first appeared, she was seen in urgent care and was started on Bactrim. Her PCP followed up and recommended daily Xeroform gauze dressing changes. Initially, it sounds like the wound was quite painful but it is now quite improved. Apparently she had been changing the dressing twice a day at a  minimum, and per her PCP's note she may be scrubbing it quite aggressively up to 4 times a day. Today, the wound is clean, but there is perimeter of undermining. I am not entirely sure that I am probing bone but we are certainly very close to it. The surface is a little bit pale but otherwise healthy. No odor or concern for infection. 08/27/2021: The wound is about the same today. There is a little bit more undermining underneath the callus that is formed around the perimeter. The wound surface is pink and moist. No odor or significant drainage. 09/04/2021: The wound is the same size today. It is very clean and the degree of undermining is about the same. The surface is a little bit hypertrophic. We have been using silver collagen and the patient's son reports that they have been making a concerted effort to tuck the dressing into the undermined area of tissue. No concern for infection. 09/11/2021: The wound is really unchanged with perhaps a little bit less undermining. It is clean and they have been using silver collagen, taking care to  tuck it into the undermined area. I thought we had applied for snap VAC approval last week, but for some reason we are unable to find this. She does say that the degree of pain she is experiencing is less and that it sometimes just throbs at night but is better than previously. Patient History Information obtained from Patient. Family History Cancer - Siblings, No family history of Diabetes, Heart Disease, Hereditary Spherocytosis, Hypertension, Kidney Disease, Lung Disease, Seizures, Stroke, Thyroid Problems, Tuberculosis. Social History Never smoker, Marital Status - Married, Alcohol Use - Never, Drug Use - No History, Caffeine Use - Daily. Medical History Eyes Patient has history of Cataracts Cardiovascular Patient has history of Hypertension Musculoskeletal Patient has history of Rheumatoid Arthritis - hands and feet Hospitalization/Surgery History - cataract surgery. Medical A Surgical History Notes nd Eyes macular degeneration Ear/Nose/Mouth/Throat GERD, dysarthria Hematologic/Lymphatic dyslipidemia Cardiovascular palpitations Musculoskeletal osteoporosis, unsteady gait Neurologic TIA Objective Constitutional Slightly hypertensive. No acute distress. Vitals Time Taken: 9:58 AM, Height: 60 in, Weight: 119 lbs, BMI: 23.2, Temperature: 98 F, Pulse: 71 bpm, Respiratory Rate: 18 breaths/min, Blood Pressure: 163/74 mmHg. Respiratory Normal work of breathing on room air. General Notes: 09/11/2021: No significant change aside from perhaps a little bit less undermining. The surface remains clean with a thin layer of biofilm on the surface. Integumentary (Hair, Skin) Wound #1 status is Open. Original cause of wound was Gradually Appeared. The date acquired was: 05/22/2021. The wound has been in treatment 3 weeks. The wound is located on the Left,Medial Foot. The wound measures 1.8cm length x 1.9cm width x 0.2cm depth; 2.686cm^2 area and 0.537cm^3 volume. There is Fat Layer  (Subcutaneous Tissue) exposed. There is no tunneling noted, however, there is undermining starting at 1:00 and ending at 4:00 with a maximum distance of 0.2cm. There is a medium amount of serosanguineous drainage noted. The wound margin is well defined and not attached to the wound base. There is large (67-100%) red, hyper - granulation within the wound bed. There is a small (1-33%) amount of necrotic tissue within the wound bed including Adherent Slough. Assessment Active Problems ICD-10 Non-pressure chronic ulcer of other part of left foot with bone involvement without evidence of necrosis Procedures Wound #1 Pre-procedure diagnosis of Wound #1 is a Pressure Ulcer located on the Left,Medial Foot . There was a Selective/Open Wound Non-Viable Tissue Debridement with a total area of 3.04 sq cm performed  by Fredirick Maudlin, MD. With the following instrument(s): Curette to remove Non-Viable tissue/material. Material removed includes [facility] after achieving pain control using Lidocaine 4% Topical Solution. No specimens were taken. A time out was conducted at 10:17, prior to the start of the procedure. A Minimum amount of bleeding was controlled with Pressure. The procedure was tolerated well with a pain level of 8 throughout and a pain level of 2 following the procedure. Post Debridement Measurements: 1.8cm length x 1.9cm width x 0.2cm depth; 0.537cm^3 volume. Post debridement Stage noted as Category/Stage III. Character of Wound/Ulcer Post Debridement is improved. Post procedure Diagnosis Wound #1: Same as Pre-Procedure Plan Follow-up Appointments: Return Appointment in 1 week. - Dr. Celine Ahr Room 2 - 5/31 at 9:15 AM Bathing/ Shower/ Hygiene: May shower and wash wound with soap and water. - with dressing changes Off-Loading: Wound #1 Left,Medial Foot: Open toe surgical shoe to: - L foot Radiology ordered were: X-ray, foot left - for non-healing wound to left foot WOUND #1: - Foot Wound  Laterality: Left, Medial Cleanser: Soap and Water 1 x Per Day/15 Days Discharge Instructions: May shower and wash wound with dial antibacterial soap and water prior to dressing change. Cleanser: Wound Cleanser 1 x Per Day/15 Days Discharge Instructions: Cleanse the wound with wound cleanser prior to applying a clean dressing using gauze sponges, not tissue or cotton balls. Cleanser: [employer] Ancillary Kit - 15 Day Supply (DME) (Generic) 1 x Per Day/15 Days Discharge Instructions: Use supplies as instructed; Kit contains: (15) Saline Bullets; (15) 3x3 Gauze; 15 pr Gloves Peri-Wound Care: Sween Lotion (Moisturizing lotion) 1 x Per Day/15 Days Discharge Instructions: Apply moisturizing lotion as directed Prim Dressing: Promogran [name] Matrix, 4.34 (sq in) (silver collagen) (Generic) 1 x Per Day/15 Days ary Discharge Instructions: Moisten collagen with saline or hydrogel Prim Dressing: Optifoam Non-Adhesive Dressing, 4x4 in (Generic) 1 x Per Day/15 Days ary Discharge Instructions: Apply to wound bed as instructed Secondary Dressing: Woven Gauze Sponge, Non-Sterile 4x4 in (Generic) 1 x Per Day/15 Days Discharge Instructions: Apply over primary dressing as directed. Secured With: Elastic Bandage 4 inch (ACE bandage) (Generic) 1 x Per Day/15 Days Discharge Instructions: Secure with ACE bandage as directed. Secured With: [employer], 4.5x3.1 (in/yd) (Generic) 1 x Per Day/15 Days Discharge Instructions: Secure with Kerlix as directed. Secured With: 64M Medipore H Soft Cloth Surgical T ape, 4 x 10 (in/yd) (DME) (Generic) 1 x Per Day/15 Days Discharge Instructions: Secure with tape as directed. 09/11/2021: No significant change aside from perhaps a little bit less undermining. The surface remains clean with a thin layer of biofilm on the surface. I used a curette to remove the biofilm from the surface. I then took a PCR culture once the biofilm was removed to assess for any infection or  colonization that might be preventing our wound from making better progress. We will reapply for snap VAC coverage as I think she would benefit from this treatment modality. Once the results of her culture are back, I will make a determination as to whether or not she would benefit from systemic versus topical antibiotic or both. In addition, the patient's son inquired about hyperbaric oxygen therapy. The patient is not diabetic and we do not have any evidence of osteomyelitis at this time. I will send her for plain film of her foot to try and evaluate this; if osteomyelitis is seen then that would be an indication for systemic antibiotics and potential hyperbaric oxygen if we can prove chronic refractory osteomyelitis. For  now, we will continue the [name] silver collagen. I will see her back in 1 week. Electronic Signature(s) Signed: 09/11/2021 11:43:48 AM By: Fredirick Maudlin MD FACS Entered By: Fredirick Maudlin on 09/11/2021 11:43:48 -------------------------------------------------------------------------------- HxROS Details Patient Name: Date of Service: [name], Kaelan 09/11/2021 10:00 A M Medical Record Number: [id number] Patient Account Number: [id number] Date of Birth/Sex: Treating RN: [birth year] (88 y.o. [name] Primary Care Provider: Dettinger, Vonna Kotyk Other Clinician: Referring Provider: Treating Provider/Extender: Fredirick Maudlin Dettinger, [name] in Treatment: 3 Information Obtained From Patient Eyes Medical History: Positive for: Cataracts Past Medical History Notes: macular degeneration Ear/Nose/Mouth/Throat Medical History: Past Medical History Notes: GERD, dysarthria Hematologic/Lymphatic Medical History: Past Medical History Notes: dyslipidemia Cardiovascular Medical History: Positive for: Hypertension Past Medical History Notes: palpitations Musculoskeletal Medical History: Positive for: Rheumatoid Arthritis - hands and feet Past  Medical History Notes: osteoporosis, unsteady gait Neurologic Medical History: Past Medical History Notes: TIA HBO Extended History Items Eyes: Cataracts Immunizations Pneumococcal Vaccine: Received Pneumococcal Vaccination: Yes Received Pneumococcal Vaccination On or After 60th Birthday: Yes Implantable Devices None Hospitalization / Surgery History Type of Hospitalization/Surgery cataract surgery Family and Social History Cancer: Yes - Siblings; Diabetes: No; Heart Disease: No; Hereditary Spherocytosis: No; Hypertension: No; Kidney Disease: No; Lung Disease: No; Seizures: No; Stroke: No; Thyroid Problems: No; Tuberculosis: No; Never smoker; Marital Status - Married; Alcohol Use: Never; Drug Use: No History; Caffeine Use: Daily; Financial Concerns: No; Food, Clothing or Shelter Needs: No; Support System Lacking: No; Transportation Concerns: No Electronic Signature(s) Signed: 09/11/2021 11:45:05 AM By: Fredirick Maudlin MD FACS Signed: 09/11/2021 5:55:49 PM By: Adline Peals Entered By: Fredirick Maudlin on 09/11/2021 11:40:22 -------------------------------------------------------------------------------- SuperBill Details Patient Name: Date of Service: [name], [name] 09/11/2021 Medical Record Number: [id number] Patient Account Number: [id number] Date of Birth/Sex: Treating RN: February 04, 1933 (88 y.o. [name] Primary Care Provider: Dettinger, Vonna Kotyk Other Clinician: Referring Provider: Treating Provider/Extender: Fredirick Maudlin Dettinger, [name] in Treatment: 3 Diagnosis Coding ICD-10 Codes Code Description [phone] Non-pressure chronic ulcer of other part of left foot with bone involvement without evidence of necrosis Facility Procedures CPT4 Code: [id number] Description: [phone] - DEBRIDE WOUND 1ST 20 SQ CM OR < ICD-10 Diagnosis Description L97.526 Non-pressure chronic ulcer of other part of left foot with bone involvement witho Modifier: ut evidence  of necr Quantity: 1 osis Physician Procedures : CPT4 Code Description Modifier [id number] - WC PHYS LEVEL 4 - EST PT 25 ICD-10 Diagnosis Description L97.526 Non-pressure chronic ulcer of other part of left foot with bone involvement without evidence of necro Quantity: 1 sis : [id number] - WC PHYS DEBR WO ANESTH 20 SQ CM ICD-10 Diagnosis Description L97.526 Non-pressure chronic ulcer of other part of left foot with bone involvement without evidence of necro Quantity: 1 sis Electronic Signature(s) Signed: 09/11/2021 11:43:59 AM By: Fredirick Maudlin MD FACS Entered By: Fredirick Maudlin on 09/11/2021 11:43:59

## 2021-09-18 ENCOUNTER — Other Ambulatory Visit (HOSPITAL_COMMUNITY): Payer: Self-pay | Admitting: General Surgery

## 2021-09-18 ENCOUNTER — Other Ambulatory Visit: Payer: Self-pay | Admitting: General Surgery

## 2021-09-18 ENCOUNTER — Encounter (HOSPITAL_BASED_OUTPATIENT_CLINIC_OR_DEPARTMENT_OTHER): Payer: Medicare Other | Admitting: General Surgery

## 2021-09-18 DIAGNOSIS — L97526 Non-pressure chronic ulcer of other part of left foot with bone involvement without evidence of necrosis: Secondary | ICD-10-CM | POA: Diagnosis not present

## 2021-09-18 DIAGNOSIS — L97522 Non-pressure chronic ulcer of other part of left foot with fat layer exposed: Secondary | ICD-10-CM | POA: Diagnosis not present

## 2021-09-18 DIAGNOSIS — M069 Rheumatoid arthritis, unspecified: Secondary | ICD-10-CM | POA: Diagnosis not present

## 2021-09-18 NOTE — Progress Notes (Signed)
JONELL, BRUMBAUGH ([id number]) Visit Report for 09/18/2021 Arrival Information Details Patient Name: Date of Service: [name], [name] 09/18/2021 9:15 [name] M Medical Record Number: [id number] Patient Account Number: [id number] Date of Birth/Sex: Treating RN: 12/07/1932 (88 y.o. [name] Primary Care Earl Zellmer: Dettinger, Vonna Kotyk Other Clinician: Referring Jmya Uliano: Treating Carilyn Woolston/Extender: Fredirick Maudlin Dettinger, [name] in Treatment: 4 Visit Information History Since Last Visit Added or deleted any medications: No Patient Arrived: [name] Any new allergies or adverse reactions: No Arrival Time: 09:03 Had [name] fall or experienced change in No Accompanied By: son activities of daily living that may affect Transfer Assistance: None risk of falls: Patient Requires Transmission-Based Precautions: No Signs or symptoms of abuse/neglect since last visito No Hospitalized since last visit: No Implantable device outside of the clinic excluding No cellular tissue based products placed in the center since last visit: Has Dressing in Place as Prescribed: Yes Pain Present Now: Yes Electronic Signature(s) Signed: 09/18/2021 5:12:22 PM By: Adline Peals Entered By: Adline Peals on 09/18/2021 09:07:58 -------------------------------------------------------------------------------- Encounter Discharge Information Details Patient Name: Date of Service: [name], [name] 09/18/2021 9:15 [name] M Medical Record Number: [id number] Patient Account Number: [id number] Date of Birth/Sex: Treating RN: Aug 08, 1932 (88 y.o. [name] Primary Care Mayerly Kaman: Dettinger, Vonna Kotyk Other Clinician: Referring Satia Winger: Treating Roby Spalla/Extender: Fredirick Maudlin Dettinger, [name] in Treatment: 4 Encounter Discharge Information Items Discharge Condition: Stable Ambulatory Status: Walker Discharge Destination: Home Transportation: Private Auto Accompanied By: son Schedule  Follow-up Appointment: Yes Clinical Summary of Care: Patient Declined Electronic Signature(s) Signed: 09/18/2021 5:12:22 PM By: Adline Peals Entered By: Adline Peals on 09/18/2021 11:29:42 -------------------------------------------------------------------------------- Lower Extremity Assessment Details Patient Name: Date of Service: [name], [name] 09/18/2021 9:15 [name] M Medical Record Number: [id number] Patient Account Number: [id number] Date of Birth/Sex: Treating RN: [birth year] (88 y.o. [name] Primary Care Romuald Mccaslin: Dettinger, Vonna Kotyk Other Clinician: Referring Naama Sappington: Treating Fard Borunda/Extender: Fredirick Maudlin Dettinger, [name] in Treatment: 4 Edema Assessment Assessed: Shirlyn Goltz: No] Patrice Paradise: No] E[Left: dema] [Right: :] Calf Left: Right: Point of Measurement: 33 cm From Medial Instep 31.2 cm Ankle Left: Right: Point of Measurement: 9 cm From Medial Instep 21.1 cm Vascular Assessment Pulses: Dorsalis Pedis Palpable: [Left:Yes] Electronic Signature(s) Signed: 09/18/2021 5:12:22 PM By: Adline Peals Entered By: Adline Peals on 09/18/2021 09:11:39 -------------------------------------------------------------------------------- Multi Wound Chart Details Patient Name: Date of Service: [name], [name] 09/18/2021 9:15 [name] M Medical Record Number: [id number] Patient Account Number: [id number] Date of Birth/Sex: Treating RN: 1933-04-17 (88 y.o. [name] Primary Care Derrisha Foos: Dettinger, Vonna Kotyk Other Clinician: Referring Enjoli Tidd: Treating Sahid Borba/Extender: Fredirick Maudlin Dettinger, [name] in Treatment: 4 Vital Signs Height(in): 60 Pulse(bpm): 91 Weight(lbs): 119 Blood Pressure(mmHg): 147/70 Body Mass Index(BMI): 23.2 Temperature(F): 98.2 Respiratory Rate(breaths/min): 18 Photos: [1:Left, Medial Foot] [N/[name]:N/[name] N/[name]] Wound Location: [1:Gradually Appeared] [N/[name]:N/[name]] Wounding Event: [1:Pressure Ulcer]  [N/[name]:N/[name]] Primary Etiology: [1:Cataracts, Hypertension, Rheumatoid N/[name]] Comorbid History: [1:Arthritis 05/22/2021] [N/[name]:N/[name]] Date Acquired: [1:4] [N/[name]:N/[name]] Weeks of Treatment: [1:Open] [N/[name]:N/[name]] Wound Status: [1:No] [N/[name]:N/[name]] Wound Recurrence: [1:1.8x1.7x0.2] [N/[name]:N/[name]] Measurements L x W x D (cm) [1:2.403] [N/[name]:N/[name]] [name] (cm) : rea [1:0.481] [N/[name]:N/[name]] Volume (cm) : [1:30.50%] [N/[name]:N/[name]] % Reduction in [name] rea: [1:-39.00%] [N/[name]:N/[name]] % Reduction in Volume: [1:Category/Stage III] [N/[name]:N/[name]] Classification: [1:Medium] [N/[name]:N/[name]] Exudate [name] mount: [1:Serosanguineous] [N/[name]:N/[name]] Exudate Type: [1:red, brown] [N/[name]:N/[name]] Exudate Color: [1:Well defined, not attached] [N/[name]:N/[name]] Wound Margin: [1:Large (67-100%)] [N/[name]:N/[name]] Granulation [name] mount: [1:Red] [N/[name]:N/[name]] Granulation Quality: [1:Small (1-33%)] [N/[name]:N/[name]] Necrotic [name] mount: [1:Fat Layer (Subcutaneous Tissue): Yes N/[name]] Exposed Structures: [1:Fascia: No  Tendon: No Muscle: No Joint: No Bone: No None] [N/[name]:N/[name]] Treatment Notes Electronic Signature(s) Signed: 09/18/2021 9:54:47 AM By: Fredirick Maudlin MD FACS Signed: 09/18/2021 5:12:22 PM By: Adline Peals Entered By: Fredirick Maudlin on 09/18/2021 09:54:47 -------------------------------------------------------------------------------- Multi-Disciplinary Care Plan Details Patient Name: Date of Service: [name], [name] 09/18/2021 9:15 [name] M Medical Record Number: [id number] Patient Account Number: [id number] Date of Birth/Sex: Treating RN: 1932/07/30 (88 y.o. [name] Primary Care Tavish Gettis: Dettinger, Vonna Kotyk Other Clinician: Referring Lizeth Bencosme: Treating Nikkol Pai/Extender: Fredirick Maudlin Dettinger, [name] in Treatment: 4 Active Inactive Abuse / Safety / Falls / Self Care Management Nursing Diagnoses: Impaired physical mobility Potential for falls Potential for injury related to falls Goals: Patient will remain injury free related to falls Date Initiated:  08/20/2021 Target Resolution Date: 10/11/2021 Goal Status: Active Patient/caregiver will demonstrate safe use of adaptive devices to increase mobility Date Initiated: 08/20/2021 Date Inactivated: 09/11/2021 Target Resolution Date: 10/11/2021 Goal Status: Met Patient/caregiver will verbalize understanding of skin care regimen Date Initiated: 08/20/2021 Date Inactivated: 09/11/2021 Target Resolution Date: 10/11/2021 Goal Status: Met Interventions: Assess fall risk on admission and as needed Assess impairment of mobility on admission and as needed per policy Provide education on fall prevention Notes: Wound/Skin Impairment Nursing Diagnoses: Impaired tissue integrity Knowledge deficit related to ulceration/compromised skin integrity Goals: Patient/caregiver will verbalize understanding of skin care regimen Date Initiated: 08/20/2021 Date Inactivated: 09/11/2021 Target Resolution Date: 09/10/2021 Goal Status: Met Ulcer/skin breakdown will have [name] volume reduction of 30% by week 4 Date Initiated: 08/20/2021 Target Resolution Date: 10/11/2021 Goal Status: Active Interventions: Assess patient/caregiver ability to perform ulcer/skin care regimen upon admission and as needed Assess ulceration(s) every visit Treatment Activities: Referred to DME [place] Behlke for dressing supplies : 08/20/2021 Skin care regimen initiated : 08/20/2021 Topical wound management initiated : 08/20/2021 Notes: Electronic Signature(s) Signed: 09/18/2021 5:12:22 PM By: Adline Peals Entered By: Adline Peals on 09/18/2021 09:13:18 -------------------------------------------------------------------------------- Negative Pressure Wound Therapy Application (NPWT) Details Patient Name: Date of Service: [name], [name] 09/18/2021 9:15 [name] M Medical Record Number: [id number] Patient Account Number: [id number] Date of Birth/Sex: Treating RN: May 18, 1932 (88 y.o. [name] Primary Care Betina Puckett: Dettinger,  Vonna Kotyk Other Clinician: Referring Tadarius Maland: Treating Andreu Drudge/Extender: Fredirick Maudlin Dettinger, [name] in Treatment: 4 NPWT Application Performed for: Wound #1 Left,Medial Foot Performed By: Adline Peals, RN Type: VAC System Coverage Size (sq cm): 3.06 Pressure Type: Constant Pressure Setting: 125 mmHG Drain Type: None Quantity of Sponges/Gauze Inserted: 1 Sponge/Dressing Type: Foam, Blue Date Initiated: 09/18/2021 Post Procedure Diagnosis Same as Pre-procedure Electronic Signature(s) Signed: 09/18/2021 5:12:22 PM By: Adline Peals Entered By: Adline Peals on 09/18/2021 11:28:57 -------------------------------------------------------------------------------- Pain Assessment Details Patient Name: Date of Service: [name], [name] 09/18/2021 9:15 [name] M Medical Record Number: [id number] Patient Account Number: [id number] Date of Birth/Sex: Treating RN: 1932-11-16 (88 y.o. [name] Primary Care Tagg Eustice: Dettinger, Vonna Kotyk Other Clinician: Referring Francille Wittmann: Treating Breuna Loveall/Extender: Fredirick Maudlin Dettinger, [name] in Treatment: 4 Active Problems Location of Pain Severity and Description of Pain Patient Has Paino Yes Site Locations Pain Location: Pain in Ulcers Duration of the Pain. Constant / Intermittento Intermittent Rate the pain. Current Pain Level: 10 Pain Management and Medication Current Pain Management: Medication: Yes Electronic Signature(s) Signed: 09/18/2021 5:12:22 PM By: Adline Peals Entered By: Adline Peals on 09/18/2021 09:08:12 -------------------------------------------------------------------------------- Patient/Caregiver Education Details Patient Name: Date of Service: [name], [name] 5/31/2023andnbsp9:15 [name] M Medical Record Number: [id number] Patient Account Number: [id number] Date of Birth/Gender: Treating RN: 1933-03-10 (  88 y.o. [name] Primary Care Physician:  Dettinger, Vonna Kotyk Other Clinician: Referring Physician: Treating Physician/Extender: Fredirick Maudlin Dettinger, [name] in Treatment: 4 Education Assessment Education Provided To: Patient Education Topics Provided Wound/Skin Impairment: Methods: Explain/Verbal Responses: Reinforcements needed, State content correctly Electronic Signature(s) Signed: 09/18/2021 5:12:22 PM By: Adline Peals Entered By: Adline Peals on 09/18/2021 09:13:30 -------------------------------------------------------------------------------- Wound Assessment Details Patient Name: Date of Service: [name], [name] 09/18/2021 9:15 [name] M Medical Record Number: [id number] Patient Account Number: [id number] Date of Birth/Sex: Treating RN: Jun 28, 1932 (88 y.o. [name] Primary Care Shaianne Mccreadie: Dettinger, Vonna Kotyk Other Clinician: Referring Pranav Lince: Treating Saretta Dahlem/Extender: Fredirick Maudlin Dettinger, [name] in Treatment: 4 Wound Status Wound Number: 1 Primary Etiology: Pressure Ulcer Wound Location: Left, Medial Foot Wound Status: Open Wounding Event: Gradually Appeared Comorbid History: Cataracts, Hypertension, Rheumatoid Arthritis Date Acquired: 05/22/2021 Weeks Of Treatment: 4 Clustered Wound: No Photos Wound Measurements Length: (cm) 1.8 Width: (cm) 1.7 Depth: (cm) 0.2 Area: (cm) 2.403 Volume: (cm) 0.481 % Reduction in Area: 30.5% % Reduction in Volume: -39% Epithelialization: None Tunneling: No Undermining: No Wound Description Classification: Category/Stage III Wound Margin: Well defined, not attached Exudate Amount: Medium Exudate Type: Serosanguineous Exudate Color: red, brown Foul Odor After Cleansing: No Slough/Fibrino Yes Wound Bed Granulation Amount: Large (67-100%) Exposed Structure Granulation Quality: Red Fascia Exposed: No Necrotic Amount: Small (1-33%) Fat Layer (Subcutaneous Tissue) Exposed: Yes Necrotic Quality: Adherent  Slough Tendon Exposed: No Muscle Exposed: No Joint Exposed: No Bone Exposed: No Treatment Notes Wound #1 (Foot) Wound Laterality: Left, Medial Cleanser Soap and Water Discharge Instruction: May shower and wash wound with dial antibacterial soap and water prior to dressing change. Wound Cleanser Discharge Instruction: Cleanse the wound with wound cleanser prior to applying [name] clean dressing using gauze sponges, not tissue or cotton balls. [facility] Ancillary Kit - 15 Day Supply Discharge Instruction: Use supplies as instructed; Kit contains: (15) Saline Bullets; (15) 3x3 Gauze; 15 pr Gloves Peri-Wound Care Topical Primary Dressing snap vac Discharge Instruction: SNAP vac as directed. Secondary Dressing Secured With Compression Wrap Compression Stockings Add-Ons Electronic Signature(s) Signed: 09/18/2021 5:12:22 PM By: Adline Peals Entered By: Adline Peals on 09/18/2021 09:15:07 -------------------------------------------------------------------------------- Vitals Details Patient Name: Date of Service: [name], [name] 09/18/2021 9:15 [name] M Medical Record Number: [id number] Patient Account Number: [id number] Date of Birth/Sex: Treating RN: Jan 24, 1933 (88 y.o. [name] Primary Care Maryn Freelove: Dettinger, Vonna Kotyk Other Clinician: Referring Ohn Bostic: Treating Paighton Godette/Extender: Fredirick Maudlin Dettinger, [name] in Treatment: 4 Vital Signs Time Taken: 09:09 Temperature (F): 98.2 Height (in): 60 Pulse (bpm): 66 Weight (lbs): 119 Respiratory Rate (breaths/min): 18 Body Mass Index (BMI): 23.2 Blood Pressure (mmHg): 147/70 Reference Range: 80 - 120 mg / dl Electronic Signature(s) Signed: 09/18/2021 5:12:22 PM By: Adline Peals Entered By: Adline Peals on 09/18/2021 09:10:24

## 2021-09-19 ENCOUNTER — Other Ambulatory Visit: Payer: Self-pay | Admitting: Family Medicine

## 2021-09-19 NOTE — Progress Notes (Signed)
[name] ([id number]) Visit Report for 09/18/2021 Chief Complaint Document Details Patient Name: Date of Service: [name], [name] 09/18/2021 9:15 [name] M Medical Record Number: [id number] Patient Account Number: [id number] Date of Birth/Sex: Treating RN: [birth year] (88 y.o. [name] Primary Care Provider: Dettinger, Vonna [name] Other Clinician: Referring Provider: Treating Provider/Extender: [name] Dettinger, [name] in Treatment: 4 Information Obtained from: Patient Chief Complaint Patient is at the clinic for treatment of an open ulcer on her left medial first metatarsal head, secondary to friction and pressure. Electronic Signature(s) Signed: 09/18/2021 9:54:54 AM By: [name] Maudlin MD FACS Entered By: [name] on 09/18/2021 09:54:53 -------------------------------------------------------------------------------- HPI Details Patient Name: Date of Service: [name], [name] 09/18/2021 9:15 [name] M Medical Record Number: [id number] Patient Account Number: [id number] Date of Birth/Sex: Treating RN: 10-23-1932 (88 y.o. [name] Primary Care Provider: Dettinger, Vonna [name] Other Clinician: Referring Provider: Treating Provider/Extender: [name] Dettinger, [name] in Treatment: 4 History of Present Illness HPI Description: ADMISSION 08/20/2021 This is an 88 year old Spanish-speaking patient who is being evaluated for an ulcer on the medial aspect of the left first metatarsal head. She says that in February, her shoes were rubbing against [name] bunion in this location and she first developed [name] blister that ultimately broke down to an ulcer. She is not [name] diabetic and she does not smoke. She does have some high blood pressure as well as rheumatoid arthritis. ABI in clinic was noncompressible. When the wound first appeared, she was seen in urgent care and was started on Bactrim. Her PCP followed up and recommended daily Xeroform gauze dressing  changes. Initially, it sounds like the wound was quite painful but it is now quite improved. Apparently she had been changing the dressing twice [name] day at [name] minimum, and per her PCP's note she may be scrubbing it quite aggressively up to 4 times [name] day. Today, the wound is clean, but there is perimeter of undermining. I am not entirely sure that I am probing bone but we are certainly very close to it. The surface is [name] little bit pale but otherwise healthy. No odor or concern for infection. 08/27/2021: The wound is about the same today. There is [name] little bit more undermining underneath the callus that is formed around the perimeter. The wound surface is pink and moist. No odor or significant drainage. 09/04/2021: The wound is the same size today. It is very clean and the degree of undermining is about the same. The surface is [name] little bit hypertrophic. We have been using silver collagen and the patient's son reports that they have been making [name] concerted effort to tuck the dressing into the undermined area of tissue. No concern for infection. 09/11/2021: The wound is really unchanged with perhaps [name] little bit less undermining. It is clean and they have been using silver collagen, taking care to tuck it into the undermined area. I thought we had applied for snap VAC approval last week, but for some reason we are unable to find this. She does say that the degree of pain she is experiencing is less and that it sometimes just throbs at night but is better than previously. 09/18/2021: Last week, due to lack of progression with the wound, I took [name] culture that grew out skin flora but I also got an x-ray that was suggestive of osteomyelitis. She continues to complain of pain and throbbing at night but nothing throughout the day. She has been approved for snap VAC use  with [name] $70- $100 co-pay with each application. The wound itself really looks no different today. Electronic Signature(s) Signed: 09/18/2021 9:55:53 AM  By: [name] Maudlin MD FACS Entered By: [name] on 09/18/2021 09:55:53 -------------------------------------------------------------------------------- Physical Exam Details Patient Name: Date of Service: [name], [name] 09/18/2021 9:15 [name] M Medical Record Number: [id number] Patient Account Number: [id number] Date of Birth/Sex: Treating RN: 1932/09/12 (88 y.o. [name] Primary Care Provider: Dettinger, Vonna [name] Other Clinician: Referring Provider: Treating Provider/Extender: [name] Dettinger, [name] in Treatment: 4 Constitutional Slightly hypertensive. . . . No acute distress. Respiratory Normal work of breathing on room air. Notes 09/18/2021: The wound is really unchanged. The undermining is about the same and does seem to probe to bone. The surface is clean but is really not progressing. Electronic Signature(s) Signed: 09/18/2021 9:56:55 AM By: [name] Maudlin MD FACS Entered By: [name] on 09/18/2021 09:56:55 -------------------------------------------------------------------------------- Physician Orders Details Patient Name: Date of Service: [name] 09/18/2021 9:15 [name] M Medical Record Number: [id number] Patient Account Number: [id number] Date of Birth/Sex: Treating RN: 05/06/1932 (88 y.o. [name] Primary Care Provider: Dettinger, Vonna [name] Other Clinician: Referring Provider: Treating Provider/Extender: [name] Dettinger, [name] in Treatment: 4 Verbal / Phone Orders: No Diagnosis Coding ICD-10 Coding Code Description [phone] Non-pressure chronic ulcer of other part of left foot with bone involvement without evidence of necrosis Follow-up Appointments ppointment in 1 week. - Dr. Celine [name] Room 2 - Return [name] Bathing/ Shower/ Hygiene May shower with protection but do not get wound dressing(s) wet. Negative Presssure Wound Therapy Wound #1 Left,Medial Foot SNAP Vac to wound continuously at  14m/hg pressure Off-Loading Wound #1 Left,Medial Foot Open toe surgical shoe to: - L foot Wound Treatment Wound #1 - Foot Wound Laterality: Left, Medial Cleanser: Soap and Water 1 x Per Day/15 Days Discharge Instructions: May shower and wash wound with dial antibacterial soap and water prior to dressing change. Cleanser: Wound Cleanser 1 x Per Day/15 Days Discharge Instructions: Cleanse the wound with wound cleanser prior to applying [name] clean dressing using gauze sponges, not tissue or cotton balls. Cleanser: Byram Ancillary Kit - 15 Day Supply (Generic) 1 x Per Day/15 Days Discharge Instructions: Use supplies as instructed; Kit contains: (15) Saline Bullets; (15) 3x3 Gauze; 15 pr Gloves Prim Dressing: snap vac 1 x Per Day/15 Days ary Discharge Instructions: SNAP vac as directed. Radiology MRI, left foot with and without contrast - Wound on left foot, rule out Osteomyelitis - (ICD10 L97.526 - Non-pressure chronic ulcer of other part of left foot with bone involvement without evidence of necrosis) Patient Medications llergies: penicillin G, lisinopril [name] Notifications Medication Indication Start End 09/18/2021 doxycycline hyclate DOSE oral 100 mg tablet - 1 tab p.o. twice daily x 14 days Electronic Signature(s) Signed: 09/18/2021 12:04:28 PM By: CFredirick MaudlinMD FACS Previous Signature: 09/18/2021 10:12:37 AM Version By: CFredirick MaudlinMD FACS Entered By: CFredirick Maudlinon 09/18/2021 10:12:51 Prescription 09/18/2021 -------------------------------------------------------------------------------- AHaydee MonicaMD Patient Name: Provider: [id number] of Birth: NPI#:[id number]: DEA #: [phone] #: License #: MProctorPatient Address: [id number] [place] [phone] penicillin G;  lisinopril Provider's Orders MRI, left foot with and without contrast - ICD10: L97.526 - Wound on left foot, rule out Osteomyelitis Hand Signature: Date(s): Electronic Signature(s) Signed: 09/18/2021 10:14:28 AM By: CFredirick MaudlinMD FACS Entered  By: [name] on 09/18/2021 10:14:28 -------------------------------------------------------------------------------- Problem List Details Patient Name: Date of Service: [name], [name] 09/18/2021 9:15 [name] M Medical Record Number: [id number] Patient Account Number: [id number] Date of Birth/Sex: Treating RN: 1933-03-31 (88 y.o. Nancy Fetter Primary Care Provider: Dettinger, Vonna [name] Other Clinician: Referring Provider: Treating Provider/Extender: [name] Dettinger, [name] in Treatment: 4 Active Problems ICD-10 Encounter Code Description Active Date MDM Diagnosis L97.526 Non-pressure chronic ulcer of other part of left foot with bone involvement 08/20/2021 No Yes without evidence of necrosis Inactive Problems Resolved Problems Electronic Signature(s) Signed: 09/18/2021 9:54:36 AM By: [name] Maudlin MD FACS Entered By: [name] on 09/18/2021 09:54:36 -------------------------------------------------------------------------------- Progress Note Details Patient Name: Date of Service: [name], [name] 09/18/2021 9:15 [name] M Medical Record Number: [id number] Patient Account Number: [id number] Date of Birth/Sex: Treating RN: 18-Aug-1932 (88 y.o. [name] Primary Care Provider: Dettinger, Vonna [name] Other Clinician: Referring Provider: Treating Provider/Extender: [name] Dettinger, [name] in Treatment: 4 Subjective Chief Complaint Information obtained from Patient Patient is at the clinic for treatment of an open ulcer on her left medial first metatarsal head, secondary to friction and pressure. History of Present Illness (HPI) ADMISSION 08/20/2021 This is an 88 year old  Spanish-speaking patient who is being evaluated for an ulcer on the medial aspect of the left first metatarsal head. She says that in February, her shoes were rubbing against [name] bunion in this location and she first developed [name] blister that ultimately broke down to an ulcer. She is not [name] diabetic and she does not smoke. She does have some high blood pressure as well as rheumatoid arthritis. ABI in clinic was noncompressible. When the wound first appeared, she was seen in urgent care and was started on Bactrim. Her PCP followed up and recommended daily Xeroform gauze dressing changes. Initially, it sounds like the wound was quite painful but it is now quite improved. Apparently she had been changing the dressing twice [name] day at [name] minimum, and per her PCP's note she may be scrubbing it quite aggressively up to 4 times [name] day. Today, the wound is clean, but there is perimeter of undermining. I am not entirely sure that I am probing bone but we are certainly very close to it. The surface is [name] little bit pale but otherwise healthy. No odor or concern for infection. 08/27/2021: The wound is about the same today. There is [name] little bit more undermining underneath the callus that is formed around the perimeter. The wound surface is pink and moist. No odor or significant drainage. 09/04/2021: The wound is the same size today. It is very clean and the degree of undermining is about the same. The surface is [name] little bit hypertrophic. We have been using silver collagen and the patient's son reports that they have been making [name] concerted effort to tuck the dressing into the undermined area of tissue. No concern for infection. 09/11/2021: The wound is really unchanged with perhaps [name] little bit less undermining. It is clean and they have been using silver collagen, taking care to tuck it into the undermined area. I thought we had applied for snap VAC approval last week, but for some reason we are unable to find this. She  does say that the degree of pain she is experiencing is less and that it sometimes just throbs at night but is better than previously. 09/18/2021: Last week, due to lack of progression with the wound, I took [name] culture that grew out skin  flora but I also got an x-ray that was suggestive of osteomyelitis. She continues to complain of pain and throbbing at night but nothing throughout the day. She has been approved for snap VAC use with [name] $70- $100 co-pay with each application. The wound itself really looks no different today. Patient History Information obtained from Patient. Family History Cancer - Siblings, No family history of Diabetes, Heart Disease, Hereditary Spherocytosis, Hypertension, Kidney Disease, Lung Disease, Seizures, Stroke, Thyroid Problems, Tuberculosis. Social History Never smoker, Marital Status - Married, Alcohol Use - Never, Drug Use - No History, Caffeine Use - Daily. Medical History Eyes Patient has history of Cataracts Cardiovascular Patient has history of Hypertension Musculoskeletal Patient has history of Rheumatoid Arthritis - hands and feet Hospitalization/Surgery History - cataract surgery. Medical [name] Surgical History Notes nd Eyes macular degeneration Ear/Nose/Mouth/Throat GERD, dysarthria Hematologic/Lymphatic dyslipidemia Cardiovascular palpitations Musculoskeletal osteoporosis, unsteady gait Neurologic TIA Objective Constitutional Slightly hypertensive. No acute distress. Vitals Time Taken: 9:09 AM, Height: 60 in, Weight: 119 lbs, BMI: 23.2, Temperature: 98.2 F, Pulse: 66 bpm, Respiratory Rate: 18 breaths/min, Blood Pressure: 147/70 mmHg. Respiratory Normal work of breathing on room air. General Notes: 09/18/2021: The wound is really unchanged. The undermining is about the same and does seem to probe to bone. The surface is clean but is really not progressing. Integumentary (Hair, Skin) Wound #1 status is Open. Original cause of wound was  Gradually Appeared. The date acquired was: 05/22/2021. The wound has been in treatment 4 weeks. The wound is located on the Left,Medial Foot. The wound measures 1.8cm length x 1.7cm width x 0.2cm depth; 2.403cm^2 area and 0.481cm^3 volume. There is Fat Layer (Subcutaneous Tissue) exposed. There is no tunneling or undermining noted. There is [name] medium amount of serosanguineous drainage noted. The wound margin is well defined and not attached to the wound base. There is large (67-100%) red granulation within the wound bed. There is [name] small (1-33%) amount of necrotic tissue within the wound bed including Adherent Slough. Assessment Active Problems ICD-10 Non-pressure chronic ulcer of other part of left foot with bone involvement without evidence of necrosis Plan Follow-up Appointments: Return Appointment in 1 week. - Dr. Celine [name] Room 2 - Bathing/ Shower/ Hygiene: May shower with protection but do not get wound dressing(s) wet. Negative Presssure Wound Therapy: Wound #1 Left,Medial Foot: SNAP Vac to wound continuously at 136m/hg pressure Off-Loading: Wound #1 Left,Medial Foot: Open toe surgical shoe to: - L foot Radiology ordered were: MRI, left foot with and without contrast - Wound on left foot, rule out Osteomyelitis The following medication(s) was prescribed: doxycycline hyclate oral 100 mg tablet 1 tab p.o. twice daily x 14 days starting 09/18/2021 WOUND #1: - Foot Wound Laterality: Left, Medial Cleanser: Soap and Water 1 x Per Day/15 Days Discharge Instructions: May shower and wash wound with dial antibacterial soap and water prior to dressing change. Cleanser: Wound Cleanser 1 x Per Day/15 Days Discharge Instructions: Cleanse the wound with wound cleanser prior to applying [name] clean dressing using gauze sponges, not tissue or cotton balls. Cleanser: Byram Ancillary Kit - 15 Day Supply (Generic) 1 x Per Day/15 Days Discharge Instructions: Use supplies as instructed; Kit contains: (15) Saline  Bullets; (15) 3x3 Gauze; 15 pr Gloves Prim Dressing: snap vac 1 x Per Day/15 Days ary Discharge Instructions: SNAP vac as directed. 09/18/2021: The wound is essentially unchanged. It is clean but not really making any progress at all. The undermining does probe toward bone. Based on the PCR culture, I did not  initially think I would need to prescribe antibiotics, but with the results from her x-ray suggestive of osteomyelitis, I am going to go ahead and prescribe [name] 2-week course of doxycycline to address the bacteria that were cultured. I am also going to get an MRI to definitively confirm the diagnosis of osteomyelitis, after which she will likely need to be seen by infectious disease. She may end up being [name] candidate for hyperbaric oxygen therapy. We will apply the snap VAC today. Follow-up in 1 week. Electronic Signature(s) Signed: 09/18/2021 10:14:12 AM By: [name] Maudlin MD FACS Entered By: [name] on 09/18/2021 10:14:12 -------------------------------------------------------------------------------- HxROS Details Patient Name: Date of Service: [name], [name] 09/18/2021 9:15 [name] M Medical Record Number: [id number] Patient Account Number: [id number] Date of Birth/Sex: Treating RN: May 23, 1932 (88 y.o. [name] Primary Care Provider: Dettinger, Vonna [name] Other Clinician: Referring Provider: Treating Provider/Extender: [name] Dettinger, [name] in Treatment: 4 Information Obtained From Patient Eyes Medical History: Positive for: Cataracts Past Medical History Notes: macular degeneration Ear/Nose/Mouth/Throat Medical History: Past Medical History Notes: GERD, dysarthria Hematologic/Lymphatic Medical History: Past Medical History Notes: dyslipidemia Cardiovascular Medical History: Positive for: Hypertension Past Medical History Notes: palpitations Musculoskeletal Medical History: Positive for: Rheumatoid Arthritis - hands and feet Past  Medical History Notes: osteoporosis, unsteady gait Neurologic Medical History: Past Medical History Notes: TIA HBO Extended History Items Eyes: Cataracts Immunizations Pneumococcal Vaccine: Received Pneumococcal Vaccination: Yes Received Pneumococcal Vaccination On or After 60th Birthday: Yes Implantable Devices None Hospitalization / Surgery History Type of Hospitalization/Surgery cataract surgery Family and Social History Cancer: Yes - Siblings; Diabetes: No; Heart Disease: No; Hereditary Spherocytosis: No; Hypertension: No; Kidney Disease: No; Lung Disease: No; Seizures: No; Stroke: No; Thyroid Problems: No; Tuberculosis: No; Never smoker; Marital Status - Married; Alcohol Use: Never; Drug Use: No History; Caffeine Use: Daily; Financial Concerns: No; Food, Clothing or Shelter Needs: No; Support System Lacking: No; Transportation Concerns: No Electronic Signature(s) Signed: 09/18/2021 12:04:28 PM By: [name] Maudlin MD FACS Signed: 09/18/2021 5:12:22 PM By: Adline Peals Entered By: [name] on 09/18/2021 09:55:58 -------------------------------------------------------------------------------- SuperBill Details Patient Name: Date of Service: [name], [name] 09/18/2021 Medical Record Number: [id number] Patient Account Number: [id number] Date of Birth/Sex: Treating RN: 1932/07/31 (88 y.o. [name] Primary Care Provider: Dettinger, Vonna [name] Other Clinician: Referring Provider: Treating Provider/Extender: [name] Dettinger, [name] in Treatment: 4 Diagnosis Coding ICD-10 Codes Code Description [phone] Non-pressure chronic ulcer of other part of left foot with bone involvement without evidence of necrosis Facility Procedures CPT4 Code: [id number] Description: 26834 NEG PRESS WND TX <=50 SQ CM ICD-10 Diagnosis Description L97.526 Non-pressure chronic ulcer of other part of left foot with bone involvement withou Modifier: t evidence of  nec Quantity: 1 rosis Physician Procedures : CPT4 Code Description Modifier [id number] - WC PHYS LEVEL 4 - EST PT ICD-10 Diagnosis Description L97.526 Non-pressure chronic ulcer of other part of left foot with bone involvement without evidence of necr Quantity: 1 osis Electronic Signature(s) Signed: 09/18/2021 12:04:28 PM By: [name] Maudlin MD FACS Signed: 09/19/2021 6:12:29 PM By: Levan Hurst RN, BSN Previous Signature: 09/18/2021 10:14:24 AM Version By: [name] Maudlin MD FACS Entered By: Levan Hurst on 09/18/2021 11:50:54

## 2021-09-25 ENCOUNTER — Encounter (HOSPITAL_BASED_OUTPATIENT_CLINIC_OR_DEPARTMENT_OTHER): Payer: Medicare Other | Attending: General Surgery | Admitting: General Surgery

## 2021-09-25 DIAGNOSIS — M069 Rheumatoid arthritis, unspecified: Secondary | ICD-10-CM | POA: Insufficient documentation

## 2021-09-25 DIAGNOSIS — L97522 Non-pressure chronic ulcer of other part of left foot with fat layer exposed: Secondary | ICD-10-CM | POA: Diagnosis not present

## 2021-09-25 DIAGNOSIS — L97526 Non-pressure chronic ulcer of other part of left foot with bone involvement without evidence of necrosis: Secondary | ICD-10-CM | POA: Insufficient documentation

## 2021-09-25 NOTE — Progress Notes (Signed)
[name], [name] ([id number]) Visit Report for 09/25/2021 Arrival Information Details Patient Name: Date of Service: [name], [name] 09/25/2021 10:00 A M Medical Record Number: [id number] Patient Account Number: [id number] Date of Birth/Sex: Treating RN: 1932/12/20 (88 y.o. Donney Rankins, Lovena Le Primary Care Romualdo Prosise: Dettinger, Vonna Kotyk Other Clinician: Referring Floella Ensz: Treating Porcia Morganti/Extender: Fredirick Maudlin Dettinger, [name] in Treatment: 5 Visit Information History Since Last Visit Added or deleted any medications: No Patient Arrived: [name] Any new allergies or adverse reactions: No Arrival Time: 09:53 Had a fall or experienced change in No Accompanied By: son activities of daily living that may affect Transfer Assistance: None risk of falls: Patient Identification Verified: Yes Signs or symptoms of abuse/neglect since last visito No Secondary Verification Process Completed: Yes Hospitalized since last visit: No Patient Requires Transmission-Based Precautions: No Implantable device outside of the clinic excluding No cellular tissue based products placed in the center since last visit: Has Dressing in Place as Prescribed: Yes Pain Present Now: Yes Electronic Signature(s) Signed: 09/25/2021 4:55:42 PM By: Adline Peals Entered By: Adline Peals on 09/25/2021 09:57:34 -------------------------------------------------------------------------------- Encounter Discharge Information Details Patient Name: Date of Service: [name], [name] 09/25/2021 10:00 A M Medical Record Number: [id number] Patient Account Number: [id number] Date of Birth/Sex: Treating RN: 1933-02-26 (88 y.o. [name] Primary Care Fayth Trefry: Dettinger, Vonna Kotyk Other Clinician: Referring Svea Pusch: Treating Koua Deeg/Extender: Fredirick Maudlin Dettinger, [name] in Treatment: 5 Encounter Discharge Information Items Discharge Condition: Stable Ambulatory Status:  Walker Discharge Destination: Home Transportation: Private Auto Accompanied By: son Schedule Follow-up Appointment: Yes Clinical Summary of Care: Patient Declined Electronic Signature(s) Signed: 09/25/2021 4:55:42 PM By: Adline Peals Entered By: Adline Peals on 09/25/2021 11:10:38 -------------------------------------------------------------------------------- Lower Extremity Assessment Details Patient Name: Date of Service: [name], [name] 09/25/2021 10:00 A M Medical Record Number: [id number] Patient Account Number: [id number] Date of Birth/Sex: Treating RN: Sep 08, 1932 (88 y.o. [name] Primary Care Markeisha Mancias: Dettinger, Vonna Kotyk Other Clinician: Referring Tylie Golonka: Treating Pinki Rottman/Extender: Fredirick Maudlin Dettinger, [name] in Treatment: 5 Edema Assessment Assessed: Shirlyn Goltz: No] Patrice Paradise: No] E[Left: dema] [Right: :] Calf Left: Right: Point of Measurement: 33 cm From Medial Instep 29.2 cm Ankle Left: Right: Point of Measurement: 9 cm From Medial Instep 21.6 cm Vascular Assessment Pulses: Dorsalis Pedis Palpable: [Left:Yes] Electronic Signature(s) Signed: 09/25/2021 4:55:42 PM By: Adline Peals Entered By: Adline Peals on 09/25/2021 10:03:47 -------------------------------------------------------------------------------- Multi Wound Chart Details Patient Name: Date of Service: [name], [name] 09/25/2021 10:00 A M Medical Record Number: [id number] Patient Account Number: [id number] Date of Birth/Sex: Treating RN: Aug 31, 1932 (88 y.o. [name] Primary Care Nikeshia Keetch: Dettinger, Vonna Kotyk Other Clinician: Referring Arretta Toenjes: Treating Eziah Negro/Extender: Fredirick Maudlin Dettinger, [name] in Treatment: 5 Vital Signs Height(in): 60 Pulse(bpm): 75 Weight(lbs): 119 Blood Pressure(mmHg): 107/64 Body Mass Index(BMI): 23.2 Temperature(F): 97.7 Respiratory Rate(breaths/min): 18 Photos: [1:Left, Medial Foot] [N/A:N/A  N/A] Wound Location: [1:Gradually Appeared] [N/A:N/A] Wounding Event: [1:Pressure Ulcer] [N/A:N/A] Primary Etiology: [1:Cataracts, Hypertension, Rheumatoid N/A] Comorbid History: [1:Arthritis 05/22/2021] [N/A:N/A] Date Acquired: [1:5] [N/A:N/A] Weeks of Treatment: [1:Open] [N/A:N/A] Wound Status: [1:No] [N/A:N/A] Wound Recurrence: [1:1.8x1.9x0.1] [N/A:N/A] Measurements L x W x D (cm) [1:2.686] [N/A:N/A] A (cm) : rea [1:0.269] [N/A:N/A] Volume (cm) : [1:22.30%] [N/A:N/A] % Reduction in A rea: [1:22.30%] [N/A:N/A] % Reduction in Volume: [1:Category/Stage III] [N/A:N/A] Classification: [1:Medium] [N/A:N/A] Exudate A mount: [1:Serosanguineous] [N/A:N/A] Exudate Type: [1:red, brown] [N/A:N/A] Exudate Color: [1:Well defined, not attached] [N/A:N/A] Wound Margin: [1:Large (67-100%)] [N/A:N/A] Granulation A mount: [1:Red] [N/A:N/A] Granulation Quality: [1:None Present (0%)] [N/A:N/A] Necrotic A mount: [  1:Fat Layer (Subcutaneous Tissue): Yes N/A] Exposed Structures: [1:Fascia: No Tendon: No Muscle: No Joint: No Bone: No Small (1-33%)] [N/A:N/A] Treatment Notes Electronic Signature(s) Signed: 09/25/2021 10:35:21 AM By: Fredirick Maudlin MD FACS Signed: 09/25/2021 4:55:42 PM By: Adline Peals Entered By: Fredirick Maudlin on 09/25/2021 10:35:21 -------------------------------------------------------------------------------- Multi-Disciplinary Care Plan Details Patient Name: Date of Service: [name], [name] 09/25/2021 10:00 A M Medical Record Number: [id number] Patient Account Number: [id number] Date of Birth/Sex: Treating RN: 1932-11-17 (88 y.o. [name] Primary Care Cache Bills: Dettinger, Vonna Kotyk Other Clinician: Referring Sherre Wooton: Treating Tessi Eustache/Extender: Fredirick Maudlin Dettinger, [name] in Treatment: 5 Active Inactive Abuse / Safety / Falls / Self Care Management Nursing Diagnoses: Impaired physical mobility Potential for falls Potential for injury  related to falls Goals: Patient will remain injury free related to falls Date Initiated: 08/20/2021 Target Resolution Date: 10/11/2021 Goal Status: Active Patient/caregiver will demonstrate safe use of adaptive devices to increase mobility Date Initiated: 08/20/2021 Date Inactivated: 09/11/2021 Target Resolution Date: 10/11/2021 Goal Status: Met Patient/caregiver will verbalize understanding of skin care regimen Date Initiated: 08/20/2021 Date Inactivated: 09/11/2021 Target Resolution Date: 10/11/2021 Goal Status: Met Interventions: Assess fall risk on admission and as needed Assess impairment of mobility on admission and as needed per policy Provide education on fall prevention Notes: Wound/Skin Impairment Nursing Diagnoses: Impaired tissue integrity Knowledge deficit related to ulceration/compromised skin integrity Goals: Patient/caregiver will verbalize understanding of skin care regimen Date Initiated: 08/20/2021 Date Inactivated: 09/11/2021 Target Resolution Date: 09/10/2021 Goal Status: Met Ulcer/skin breakdown will have a volume reduction of 30% by week 4 Date Initiated: 08/20/2021 Target Resolution Date: 10/11/2021 Goal Status: Active Interventions: Assess patient/caregiver ability to perform ulcer/skin care regimen upon admission and as needed Assess ulceration(s) every visit Treatment Activities: Referred to DME [facility] Crago for dressing supplies : 08/20/2021 Skin care regimen initiated : 08/20/2021 Topical wound management initiated : 08/20/2021 Notes: Electronic Signature(s) Signed: 09/25/2021 4:55:42 PM By: Adline Peals Entered By: Adline Peals on 09/25/2021 10:06:49 -------------------------------------------------------------------------------- Pain Assessment Details Patient Name: Date of Service: [name], [name] 09/25/2021 10:00 A M Medical Record Number: [id number] Patient Account Number: [id number] Date of Birth/Sex: Treating RN: [birth year] (88 y.o. [name] Primary Care Nydia Ytuarte: Dettinger, Vonna Kotyk Other Clinician: Referring Alexarae Oliva: Treating Merrell Rettinger/Extender: Fredirick Maudlin Dettinger, [name] in Treatment: 5 Active Problems Location of Pain Severity and Description of Pain Patient Has Paino Yes Site Locations Pain Location: Pain in Ulcers Duration of the Pain. Constant / Intermittento Intermittent Rate the pain. Current Pain Level: Insensate Pain Management and Medication Current Pain Management: Medication: Yes How does your wound impact your activities of daily livingo Sleep: Yes Electronic Signature(s) Signed: 09/25/2021 4:55:42 PM By: Adline Peals Entered By: Adline Peals on 09/25/2021 10:00:00 -------------------------------------------------------------------------------- Patient/Caregiver Education Details Patient Name: Date of Service: [name], [name] 6/7/2023andnbsp10:00 A M Medical Record Number: [id number] Patient Account Number: [id number] Date of Birth/Gender: Treating RN: [birth year] (88 y.o. [name] Primary Care Physician: Dettinger, Vonna Kotyk Other Clinician: Referring Physician: Treating Physician/Extender: Fredirick Maudlin Dettinger, [name] in Treatment: 5 Education Assessment Education Provided To: Patient Education Topics Provided Wound/Skin Impairment: Methods: Explain/Verbal Responses: Reinforcements needed, State content correctly Electronic Signature(s) Signed: 09/25/2021 4:55:42 PM By: Adline Peals Entered By: Adline Peals on 09/25/2021 10:07:01 -------------------------------------------------------------------------------- Wound Assessment Details Patient Name: Date of Service: [name], [name] 09/25/2021 10:00 A M Medical Record Number: [id number] Patient Account Number: [id number] Date of Birth/Sex: Treating RN: 1932/10/14 (88 y.o. [name] Primary Care Maciej Schweitzer: Dettinger, Vonna Kotyk Other  Clinician: Referring Tylea Hise: Treating Yann Biehn/Extender: Fredirick Maudlin Dettinger, [name] in Treatment: 5 Wound Status Wound Number: 1 Primary Etiology: Pressure Ulcer Wound Location: Left, Medial Foot Wound Status: Open Wounding Event: Gradually Appeared Comorbid History: Cataracts, Hypertension, Rheumatoid Arthritis Date Acquired: 05/22/2021 Weeks Of Treatment: 5 Clustered Wound: No Photos Wound Measurements Length: (cm) 1.8 Width: (cm) 1.9 Depth: (cm) 0.1 Area: (cm) 2.686 Volume: (cm) 0.269 % Reduction in Area: 22.3% % Reduction in Volume: 22.3% Epithelialization: Small (1-33%) Tunneling: No Undermining: No Wound Description Classification: Category/Stage III Wound Margin: Well defined, not attached Exudate Amount: Medium Exudate Type: Serosanguineous Exudate Color: red, brown Foul Odor After Cleansing: No Slough/Fibrino No Wound Bed Granulation Amount: Large (67-100%) Exposed Structure Granulation Quality: Red Fascia Exposed: No Necrotic Amount: None Present (0%) Fat Layer (Subcutaneous Tissue) Exposed: Yes Tendon Exposed: No Muscle Exposed: No Joint Exposed: No Bone Exposed: No Treatment Notes Wound #1 (Foot) Wound Laterality: Left, Medial Cleanser Soap and Water Discharge Instruction: May shower and wash wound with dial antibacterial soap and water prior to dressing change. Wound Cleanser Discharge Instruction: Cleanse the wound with wound cleanser prior to applying a clean dressing using gauze sponges, not tissue or cotton balls. Peri-Wound Care Topical Primary Dressing snap vac Discharge Instruction: SNAP vac as directed. Secondary Dressing Secured With Compression Wrap Compression Stockings Add-Ons Electronic Signature(s) Signed: 09/25/2021 4:55:42 PM By: Adline Peals Entered By: Adline Peals on 09/25/2021 10:06:22 -------------------------------------------------------------------------------- Vitals Details Patient  Name: Date of Service: [name], [name] 09/25/2021 10:00 A M Medical Record Number: [id number] Patient Account Number: [id number] Date of Birth/Sex: Treating RN: 11/12/1932 (88 y.o. [name] Primary Care Dierdra Salameh: Dettinger, Vonna Kotyk Other Clinician: Referring Sheyenne Konz: Treating Clydia Nieves/Extender: Fredirick Maudlin Dettinger, [name] in Treatment: 5 Vital Signs Time Taken: 10:00 Temperature (F): 97.7 Height (in): 60 Pulse (bpm): 75 Weight (lbs): 119 Respiratory Rate (breaths/min): 18 Body Mass Index (BMI): 23.2 Blood Pressure (mmHg): 107/64 Reference Range: 80 - 120 mg / dl Electronic Signature(s) Signed: 09/25/2021 4:55:42 PM By: Adline Peals Entered By: Adline Peals on 09/25/2021 10:00:24

## 2021-09-25 NOTE — Progress Notes (Signed)
[name], [name] ([id number]) Visit Report for 09/25/2021 Chief Complaint Document Details Patient Name: Date of Service: A LV [name], [name] 09/25/2021 10:00 A M Medical Record Number: [id number] Patient Account Number: [id number] Date of Birth/Sex: Treating RN: Jul 15, 1932 (88 y.o. [name] Primary Care Provider: Dettinger, Vonna Kotyk Other Clinician: Referring Provider: Treating Provider/Extender: Fredirick Maudlin Dettinger, [name] in Treatment: 5 Information Obtained from: Patient Chief Complaint Patient is at the clinic for treatment of an open ulcer on her left medial first metatarsal head, secondary to friction and pressure. Electronic Signature(s) Signed: 09/25/2021 10:35:27 AM By: Fredirick Maudlin MD FACS Entered By: Fredirick Maudlin on 09/25/2021 10:35:27 -------------------------------------------------------------------------------- HPI Details Patient Name: Date of Service: A LV [name], [name] 09/25/2021 10:00 A M Medical Record Number: [id number] Patient Account Number: [id number] Date of Birth/Sex: Treating RN: 1932/04/28 (88 y.o. [name] Primary Care Provider: Dettinger, Vonna Kotyk Other Clinician: Referring Provider: Treating Provider/Extender: Fredirick Maudlin Dettinger, [name] in Treatment: 5 History of Present Illness HPI Description: ADMISSION 08/20/2021 This is an 88 year old Spanish-speaking patient who is being evaluated for an ulcer on the medial aspect of the left first metatarsal head. She says that in February, her shoes were rubbing against a bunion in this location and she first developed a blister that ultimately broke down to an ulcer. She is not a diabetic and she does not smoke. She does have some high blood pressure as well as rheumatoid arthritis. ABI in clinic was noncompressible. When the wound first appeared, she was seen in urgent care and was started on Bactrim. Her PCP followed up and recommended daily Xeroform gauze dressing  changes. Initially, it sounds like the wound was quite painful but it is now quite improved. Apparently she had been changing the dressing twice a day at a minimum, and per her PCP's note she may be scrubbing it quite aggressively up to 4 times a day. Today, the wound is clean, but there is perimeter of undermining. I am not entirely sure that I am probing bone but we are certainly very close to it. The surface is a little bit pale but otherwise healthy. No odor or concern for infection. 08/27/2021: The wound is about the same today. There is a little bit more undermining underneath the callus that is formed around the perimeter. The wound surface is pink and moist. No odor or significant drainage. 09/04/2021: The wound is the same size today. It is very clean and the degree of undermining is about the same. The surface is a little bit hypertrophic. We have been using silver collagen and the patient's son reports that they have been making a concerted effort to tuck the dressing into the undermined area of tissue. No concern for infection. 09/11/2021: The wound is really unchanged with perhaps a little bit less undermining. It is clean and they have been using silver collagen, taking care to tuck it into the undermined area. I thought we had applied for snap VAC approval last week, but for some reason we are unable to find this. She does say that the degree of pain she is experiencing is less and that it sometimes just throbs at night but is better than previously. 09/18/2021: Last week, due to lack of progression with the wound, I took a culture that grew out skin flora but I also got an x-ray that was suggestive of osteomyelitis. She continues to complain of pain and throbbing at night but nothing throughout the day. She has been approved for snap VAC use  with a $70- $100 co-pay with each application. The wound itself really looks no different today. 09/25/2021: I initiated doxycycline last week and she  has been taking this. We also apply the snap VAC. The wound looks markedly better today with significant closure of the undermined portion. She continues to have some throbbing pain at night, but otherwise is doing well. We ordered an MRI to get a more definitive answer regarding the possibility of osteomyelitis, but they have not yet received an appointment for this. Electronic Signature(s) Signed: 09/25/2021 10:36:28 AM By: Fredirick Maudlin MD FACS Entered By: Fredirick Maudlin on 09/25/2021 10:36:27 -------------------------------------------------------------------------------- Physical Exam Details Patient Name: Date of Service: A LV [name], [name] 09/25/2021 10:00 A M Medical Record Number: [id number] Patient Account Number: [id number] Date of Birth/Sex: Treating RN: 12/20/1932 (88 y.o. [name] Primary Care Provider: Dettinger, Vonna Kotyk Other Clinician: Referring Provider: Treating Provider/Extender: Fredirick Maudlin Dettinger, [name] in Treatment: 5 Constitutional . . . . No acute distress.Marland Kitchen Respiratory Normal work of breathing on room air.. Notes 09/25/2021: The undermined portion of the wound is substantially smaller. The surface is clean with good granulation tissue. Electronic Signature(s) Signed: 09/25/2021 10:37:20 AM By: Fredirick Maudlin MD FACS Entered By: Fredirick Maudlin on 09/25/2021 10:37:20 -------------------------------------------------------------------------------- Physician Orders Details Patient Name: Date of Service: A LV [name], [name] 09/25/2021 10:00 A M Medical Record Number: [id number] Patient Account Number: [id number] Date of Birth/Sex: Treating RN: Mar 17, 1933 (88 y.o. [name] Primary Care Provider: Dettinger, Vonna Kotyk Other Clinician: Referring Provider: Treating Provider/Extender: Fredirick Maudlin Dettinger, [name] in Treatment: 5 Verbal / Phone Orders: No Diagnosis Coding ICD-10 Coding Code Description [phone]  Non-pressure chronic ulcer of other part of left foot with bone involvement without evidence of necrosis Follow-up Appointments ppointment in 1 week. - Dr. Celine Ahr Room 2 - 6/14 at 8:30 AM Return A Bathing/ Shower/ Hygiene May shower with protection but do not get wound dressing(s) wet. Negative Presssure Wound Therapy Wound #1 Left,Medial Foot SNAP Vac to wound continuously at 11m/hg pressure Off-Loading Wound #1 Left,Medial Foot Open toe surgical shoe to: - L foot Wound Treatment Wound #1 - Foot Wound Laterality: Left, Medial Cleanser: Soap and Water 1 x Per Day/15 Days Discharge Instructions: May shower and wash wound with dial antibacterial soap and water prior to dressing change. Cleanser: Wound Cleanser 1 x Per Day/15 Days Discharge Instructions: Cleanse the wound with wound cleanser prior to applying a clean dressing using gauze sponges, not tissue or cotton balls. Prim Dressing: snap vac 1 x Per Day/15 Days ary Discharge Instructions: SNAP vac as directed. Electronic Signature(s) Signed: 09/25/2021 2:37:03 PM By: CFredirick MaudlinMD FACS Entered By: CFredirick Maudlinon 09/25/2021 10:43:08 -------------------------------------------------------------------------------- Problem List Details Patient Name: Date of Service: A LV [name], [name] 09/25/2021 10:00 A M Medical Record Number: [id number] Account Number: [id number] of Birth/Sex: Treating RN: 1Nov 19, 1934(89y.o. FHarlow OhmsPrimary Care Provider: Dettinger, JVonna KotykOther Clinician: Referring Provider: Treating Provider/Extender: CFredirick MaudlinDettinger, JJory Simsin Treatment: 5 Active Problems ICD-10 Encounter Code Description Active Date MDM Diagnosis L97.526 Non-pressure chronic ulcer of other part of left foot with bone involvement 08/20/2021 No Yes without evidence of necrosis Inactive Problems Resolved Problems Electronic Signature(s) Signed: 09/25/2021 10:35:15 AM By: CFredirick MaudlinMD FACS Entered By: CFredirick Maudlinon 09/25/2021 10:35:14 -------------------------------------------------------------------------------- Progress Note Details Patient Name: Date of Service: A LV [name], [name] 09/25/2021 10:00 A M Medical Record Number: [id number] Account Number: [id number] of Birth/Sex: Treating RN:  27-Apr-1932 (88 y.o. [name] Primary Care Provider: Dettinger, Vonna Kotyk Other Clinician: Referring Provider: Treating Provider/Extender: Fredirick Maudlin Dettinger, [name] in Treatment: 5 Subjective Chief Complaint Information obtained from Patient Patient is at the clinic for treatment of an open ulcer on her left medial first metatarsal head, secondary to friction and pressure. History of Present Illness (HPI) ADMISSION 08/20/2021 This is an 88 year old Spanish-speaking patient who is being evaluated for an ulcer on the medial aspect of the left first metatarsal head. She says that in February, her shoes were rubbing against a bunion in this location and she first developed a blister that ultimately broke down to an ulcer. She is not a diabetic and she does not smoke. She does have some high blood pressure as well as rheumatoid arthritis. ABI in clinic was noncompressible. When the wound first appeared, she was seen in urgent care and was started on Bactrim. Her PCP followed up and recommended daily Xeroform gauze dressing changes. Initially, it sounds like the wound was quite painful but it is now quite improved. Apparently she had been changing the dressing twice a day at a minimum, and per her PCP's note she may be scrubbing it quite aggressively up to 4 times a day. Today, the wound is clean, but there is perimeter of undermining. I am not entirely sure that I am probing bone but we are certainly very close to it. The surface is a little bit pale but otherwise healthy. No odor or concern for infection. 08/27/2021: The wound is about  the same today. There is a little bit more undermining underneath the callus that is formed around the perimeter. The wound surface is pink and moist. No odor or significant drainage. 09/04/2021: The wound is the same size today. It is very clean and the degree of undermining is about the same. The surface is a little bit hypertrophic. We have been using silver collagen and the patient's son reports that they have been making a concerted effort to tuck the dressing into the undermined area of tissue. No concern for infection. 09/11/2021: The wound is really unchanged with perhaps a little bit less undermining. It is clean and they have been using silver collagen, taking care to tuck it into the undermined area. I thought we had applied for snap VAC approval last week, but for some reason we are unable to find this. She does say that the degree of pain she is experiencing is less and that it sometimes just throbs at night but is better than previously. 09/18/2021: Last week, due to lack of progression with the wound, I took a culture that grew out skin flora but I also got an x-ray that was suggestive of osteomyelitis. She continues to complain of pain and throbbing at night but nothing throughout the day. She has been approved for snap VAC use with a $70- $100 co-pay with each application. The wound itself really looks no different today. 09/25/2021: I initiated doxycycline last week and she has been taking this. We also apply the snap VAC. The wound looks markedly better today with significant closure of the undermined portion. She continues to have some throbbing pain at night, but otherwise is doing well. We ordered an MRI to get a more definitive answer regarding the possibility of osteomyelitis, but they have not yet received an appointment for this. Patient History Information obtained from Patient. Family History Cancer - Siblings, No family history of Diabetes, Heart Disease, Hereditary  Spherocytosis, Hypertension, Kidney Disease, Lung  Disease, Seizures, Stroke, Thyroid Problems, Tuberculosis. Social History Never smoker, Marital Status - Married, Alcohol Use - Never, Drug Use - No History, Caffeine Use - Daily. Medical History Eyes Patient has history of Cataracts Cardiovascular Patient has history of Hypertension Musculoskeletal Patient has history of Rheumatoid Arthritis - hands and feet Hospitalization/Surgery History - cataract surgery. Medical A Surgical History Notes nd Eyes macular degeneration Ear/Nose/Mouth/Throat GERD, dysarthria Hematologic/Lymphatic dyslipidemia Cardiovascular palpitations Musculoskeletal osteoporosis, unsteady gait Neurologic TIA Objective Constitutional No acute distress.. Vitals Time Taken: 10:00 AM, Height: 60 in, Weight: 119 lbs, BMI: 23.2, Temperature: 97.7 F, Pulse: 75 bpm, Respiratory Rate: 18 breaths/min, Blood Pressure: 107/64 mmHg. Respiratory Normal work of breathing on room air.. General Notes: 09/25/2021: The undermined portion of the wound is substantially smaller. The surface is clean with good granulation tissue. Integumentary (Hair, Skin) Wound #1 status is Open. Original cause of wound was Gradually Appeared. The date acquired was: 05/22/2021. The wound has been in treatment 5 weeks. The wound is located on the Left,Medial Foot. The wound measures 1.8cm length x 1.9cm width x 0.1cm depth; 2.686cm^2 area and 0.269cm^3 volume. There is Fat Layer (Subcutaneous Tissue) exposed. There is no tunneling or undermining noted. There is a medium amount of serosanguineous drainage noted. The wound margin is well defined and not attached to the wound base. There is large (67-100%) red granulation within the wound bed. There is no necrotic tissue within the wound bed. Assessment Active Problems ICD-10 Non-pressure chronic ulcer of other part of left foot with bone involvement without evidence of necrosis Plan Follow-up  Appointments: Return Appointment in 1 week. - Dr. Celine Ahr Room 2 - 6/14 at 8:30 AM Bathing/ Shower/ Hygiene: May shower with protection but do not get wound dressing(s) wet. Negative Presssure Wound Therapy: Wound #1 Left,Medial Foot: SNAP Vac to wound continuously at 140m/hg pressure Off-Loading: Wound #1 Left,Medial Foot: Open toe surgical shoe to: - L foot WOUND #1: - Foot Wound Laterality: Left, Medial Cleanser: Soap and Water 1 x Per Day/15 Days Discharge Instructions: May shower and wash wound with dial antibacterial soap and water prior to dressing change. Cleanser: Wound Cleanser 1 x Per Day/15 Days Discharge Instructions: Cleanse the wound with wound cleanser prior to applying a clean dressing using gauze sponges, not tissue or cotton balls. Prim Dressing: snap vac 1 x Per Day/15 Days ary Discharge Instructions: SNAP vac as directed. 09/25/2021: The undermined portion of the wound is substantially smaller. The surface is clean with good granulation tissue. No debridement was necessary today. We will get the telephone number for MRI and provide that to her son to see if we can get her in for an appointment. She will continue and complete her course of doxycycline. We will continue using the snap VAC. Follow-up in 1 week. Electronic Signature(s) Signed: 09/25/2021 10:43:50 AM By: CFredirick MaudlinMD FACS Entered By: CFredirick Maudlinon 09/25/2021 10:43:50 -------------------------------------------------------------------------------- HxROS Details Patient Name: Date of Service: A LV [name], [name] 09/25/2021 10:00 A M Medical Record Number: [id number] Account Number: [id number] of Birth/Sex: Treating RN: 102-Aug-1934(88y.o. FHarlow OhmsPrimary Care Provider: Dettinger, JVonna KotykOther Clinician: Referring Provider: Treating Provider/Extender: CFredirick MaudlinDettinger, JJory Simsin Treatment: 5 Information Obtained From Patient Eyes Medical  History: Positive for: Cataracts Past Medical History Notes: macular degeneration Ear/Nose/Mouth/Throat Medical History: Past Medical History Notes: GERD, dysarthria Hematologic/Lymphatic Medical History: Past Medical History Notes: dyslipidemia Cardiovascular Medical History: Positive for: Hypertension Past Medical History Notes: palpitations Musculoskeletal Medical History: Positive for: Rheumatoid  Arthritis - hands and feet Past Medical History Notes: osteoporosis, unsteady gait Neurologic Medical History: Past Medical History Notes: TIA HBO Extended History Items Eyes: Cataracts Immunizations Pneumococcal Vaccine: Received Pneumococcal Vaccination: Yes Received Pneumococcal Vaccination On or After 60th Birthday: Yes Implantable Devices None Hospitalization / Surgery History Type of Hospitalization/Surgery cataract surgery Family and Social History Cancer: Yes - Siblings; Diabetes: No; Heart Disease: No; Hereditary Spherocytosis: No; Hypertension: No; Kidney Disease: No; Lung Disease: No; Seizures: No; Stroke: No; Thyroid Problems: No; Tuberculosis: No; Never smoker; Marital Status - Married; Alcohol Use: Never; Drug Use: No History; Caffeine Use: Daily; Financial Concerns: No; Food, Clothing or Shelter Needs: No; Support System Lacking: No; Transportation Concerns: No Electronic Signature(s) Signed: 09/25/2021 2:37:03 PM By: Fredirick Maudlin MD FACS Signed: 09/25/2021 4:55:42 PM By: Sabas Sous By: Fredirick Maudlin on 09/25/2021 10:36:32 -------------------------------------------------------------------------------- SuperBill Details Patient Name: Date of Service: A LV [name], [name] 09/25/2021 Medical Record Number: [id number] Patient Account Number: [id number] Date of Birth/Sex: Treating RN: Apr 06, 1933 (88 y.o. [name] Primary Care Provider: Dettinger, Vonna Kotyk Other Clinician: Referring Provider: Treating Provider/Extender: Fredirick Maudlin Dettinger, [name] in Treatment: 5 Diagnosis Coding ICD-10 Codes Code Description [phone] Non-pressure chronic ulcer of other part of left foot with bone involvement without evidence of necrosis Physician Procedures : CPT4 Code Description Modifier [id number] - WC PHYS LEVEL 3 - EST PT ICD-10 Diagnosis Description L97.526 Non-pressure chronic ulcer of other part of left foot with bone involvement without evidence of necr Quantity: 1 osis Electronic Signature(s) Signed: 09/25/2021 10:44:25 AM By: Fredirick Maudlin MD FACS Entered By: Fredirick Maudlin on 09/25/2021 10:44:25

## 2021-10-01 NOTE — Progress Notes (Signed)
[name], [name] ([id number]) Visit Report for 08/20/2021 Allergy List Details Patient Name: Date of Service: [name], [name] 08/20/2021 9:00 [name] M Medical Record Number: [id number] Patient Account Number: [id number] Date of Birth/Sex: Treating RN: 03/06/1933 (88 y.o. [name] Primary Care [name]: [name], [name] Kotyk Other Clinician: Referring [name]: Treating [name]/Extender: [name], [name] in Treatment: 0 Allergies Active Allergies penicillin G Reaction: hives lisinopril Reaction: swelling Allergy Notes Electronic Signature(s) Signed: 08/20/2021 5:03:59 PM By: Adline Peals Entered By: Adline Peals on 08/20/2021 09:43:50 -------------------------------------------------------------------------------- Arrival Information Details Patient Name: Date of Service: [name], [name] 08/20/2021 9:00 [name] M Medical Record Number: [id number] Patient Account Number: [id number] Date of Birth/Sex: Treating RN: [birth year] (88 y.o. [name] Primary Care Calvin Chura: [name], [name] Kotyk Other Clinician: Referring [name]: Treating [name]/Extender: [name], [name] in Treatment: 0 Visit Information Patient Arrived: [name] Time: 09:21 Accompanied By: son, husband Transfer Assistance: None Patient Identification Verified: Yes Secondary Verification Process Completed: Yes Patient Requires Transmission-Based Precautions: No Electronic Signature(s) Signed: 10/01/2021 8:46:54 AM By: Erenest Blank Entered By: Erenest Blank on 08/20/2021 09:24:35 -------------------------------------------------------------------------------- Clinic Level of Care Assessment Details Patient Name: Date of Service: [name], [name] 08/20/2021 9:00 [name] M Medical Record Number: [id number] Patient Account Number: [id number] Date of Birth/Sex: Treating RN: Mar 12, 1933 (88 y.o. [name] Primary Care [name]: [name],  [name] Kotyk Other Clinician: Referring [name]: Treating [name]/Extender: [name], [name] in Treatment: 0 Clinic Level of Care Assessment Items TOOL 1 Quantity Score X- 1 0 Use when EandM and Procedure is performed on INITIAL visit ASSESSMENTS - Nursing Assessment / Reassessment X- 1 20 General Physical Exam (combine w/ comprehensive assessment (listed just below) when performed on new pt. evals) X- 1 25 Comprehensive Assessment (HX, ROS, Risk Assessments, Wounds Hx, etc.) ASSESSMENTS - Wound and Skin Assessment / Reassessment '[]'$  - 0 Dermatologic / Skin Assessment (not related to wound area) ASSESSMENTS - Ostomy and/or Continence Assessment and Care '[]'$  - 0 Incontinence Assessment and Management '[]'$  - 0 Ostomy Care Assessment and Management (repouching, etc.) PROCESS - Coordination of Care X - Simple Patient / Family Education for ongoing care 1 15 '[]'$  - 0 Complex (extensive) Patient / Family Education for ongoing care X- 1 10 Staff obtains Programmer, systems, Records, T Results / Process Orders est '[]'$  - 0 Staff telephones HHA, Nursing Homes / Clarify orders / etc '[]'$  - 0 Routine Transfer to another Facility (non-emergent condition) '[]'$  - 0 Routine Hospital Admission (non-emergent condition) X- 1 15 New Admissions / Biomedical engineer / Ordering NPWT Apligraf, etc. , '[]'$  - 0 Emergency Hospital Admission (emergent condition) PROCESS - Special Needs '[]'$  - 0 Pediatric / Minor Patient Management '[]'$  - 0 Isolation Patient Management X- 1 15 Hearing / Language / Visual special needs '[]'$  - 0 Assessment of Community assistance (transportation, D/C planning, etc.) '[]'$  - 0 Additional assistance / Altered mentation '[]'$  - 0 Support Surface(s) Assessment (bed, cushion, seat, etc.) INTERVENTIONS - Miscellaneous '[]'$  - 0 External ear exam '[]'$  - 0 Patient Transfer (multiple staff / Civil Service fast streamer / Similar devices) '[]'$  - 0 Simple Staple / Suture removal (25 or less) '[]'$  -  0 Complex Staple / Suture removal (26 or more) '[]'$  - 0 Hypo/Hyperglycemic Management (do not check if billed separately) X- 1 15 Ankle / Brachial Index (ABI) - do not check if billed separately Has the patient been seen at the hospital within the last three years: Yes Total Score: 115 Level Of Care: New/Established -  Level 3 Electronic Signature(s) Signed: 08/20/2021 5:03:59 PM By: Adline Peals Entered By: Adline Peals on 08/20/2021 11:59:11 -------------------------------------------------------------------------------- Encounter Discharge Information Details Patient Name: Date of Service: [name], [name] 08/20/2021 9:00 [name] M Medical Record Number: [id number] Patient Account Number: [id number] Date of Birth/Sex: Treating RN: 1932-06-02 (88 y.o. [name] Primary Care [name]: [name], [name] Kotyk Other Clinician: Referring [name]: Treating [name]/Extender: [name], [name] in Treatment: 0 Encounter Discharge Information Items Post Procedure Vitals Discharge Condition: Stable Temperature (F): 98.1 Ambulatory Status: Walker Pulse (bpm): 76 Discharge Destination: Home Respiratory Rate (breaths/min): 18 Transportation: Private Auto Blood Pressure (mmHg): 135/68 Accompanied By: son Schedule Follow-up Appointment: Yes Clinical Summary of Care: Patient Declined Electronic Signature(s) Signed: 08/20/2021 5:03:59 PM By: Adline Peals Entered By: Adline Peals on 08/20/2021 12:00:10 -------------------------------------------------------------------------------- Lower Extremity Assessment Details Patient Name: Date of Service: [name], [name] 08/20/2021 9:00 [name] M Medical Record Number: [id number] Patient Account Number: [id number] Date of Birth/Sex: Treating RN: Jun 19, 1932 (88 y.o. [name] Primary Care [name]: [name], [name] Kotyk Other Clinician: Referring Haydin Dunn: Treating [name]/Extender: [name], [name] in Treatment: 0 Edema Assessment Assessed: [name]: Yes] [name]: No] E[Left: dema] [Right: :] Calf Left: Right: Point of Measurement: 33 cm From Medial Instep 30.6 cm Ankle Left: Right: Point of Measurement: 9 cm From Medial Instep 22 cm Vascular Assessment Pulses: Dorsalis Pedis Palpable: [Left:Yes] Notes L ABI non compressible Electronic Signature(s) Signed: 08/20/2021 5:03:59 PM By: Adline Peals Signed: 08/21/2021 5:38:18 PM By: Baruch Gouty RN, BSN Entered By: Adline Peals on 08/20/2021 10:14:24 -------------------------------------------------------------------------------- Multi Wound Chart Details Patient Name: Date of Service: [name], Zaria 08/20/2021 9:00 [name] M Medical Record Number: [id number] Patient Account Number: [id number] Date of Birth/Sex: Treating RN: Mar 07, 1933 (88 y.o. [name] Primary Care Dallys Nowakowski: [name], [name] Kotyk Other Clinician: Referring Aqsa Sensabaugh: Treating Nieko Clarin/Extender: [name], [name] in Treatment: 0 Vital Signs Height(in): 60 Pulse(bpm): 79 Weight(lbs): 119 Blood Pressure(mmHg): 135/68 Body Mass Index(BMI): 23.2 Temperature(F): 98.1 Respiratory Rate(breaths/min): 18 Photos: [N/[name]:N/[name]] Left, Medial Foot N/[name] N/[name] Wound Location: Gradually Appeared N/[name] N/[name] Wounding Event: T be determined o N/[name] N/[name] Primary Etiology: Cataracts, Hypertension, Rheumatoid N/[name] N/[name] Comorbid History: Arthritis 06/20/2021 N/[name] N/[name] Date Acquired: 0 N/[name] N/[name] Weeks of Treatment: Open N/[name] N/[name] Wound Status: No N/[name] N/[name] Wound Recurrence: 2.2x2x0.1 N/[name] N/[name] Measurements L x W x D (cm) 3.456 N/[name] N/[name] [name] (cm) : rea 0.346 N/[name] N/[name] Volume (cm) : 0.00% N/[name] N/[name] % Reduction in [name] rea: 0.00% N/[name] N/[name] % Reduction in Volume: 11 Starting Position 1 (o'clock): 7 Ending Position 1 (o'clock): 0.2 Maximum Distance 1 (cm): Yes N/[name] N/[name] Undermining: Full Thickness Without Exposed N/[name]  N/[name] Classification: Support Structures Medium N/[name] N/[name] Exudate [name] mount: Serosanguineous N/[name] N/[name] Exudate Type: red, brown N/[name] N/[name] Exudate Color: Well defined, not attached N/[name] N/[name] Wound Margin: Large (67-100%) N/[name] N/[name] Granulation [name] mount: Red N/[name] N/[name] Granulation Quality: Small (1-33%) N/[name] N/[name] Necrotic [name] mount: Fat Layer (Subcutaneous Tissue): Yes N/[name] N/[name] Exposed Structures: Fascia: No Tendon: No Muscle: No Joint: No Bone: No None N/[name] N/[name] Epithelialization: Debridement - Selective/Open Wound N/[name] N/[name] Debridement: Pre-procedure Verification/Time Out 10:28 N/[name] N/[name] Taken: Lidocaine 4% T opical Solution N/[name] N/[name] Pain Control: Callus, Slough N/[name] N/[name] Tissue Debrided: Skin/Epidermis N/[name] N/[name] Level: 4.4 N/[name] N/[name] Debridement [name] (sq cm): rea Curette N/[name] N/[name] Instrument: Minimum N/[name] N/[name] Bleeding: Pressure N/[name] N/[name] Hemostasis [name] chieved: 0 N/[name] N/[name] Procedural Pain: 0 N/[name] N/[name] Post Procedural Pain: Procedure was  tolerated well N/[name] N/[name] Debridement Treatment Response: 2.2x2x0.1 N/[name] N/[name] Post Debridement Measurements L x W x D (cm) 0.346 N/[name] N/[name] Post Debridement Volume: (cm) Debridement N/[name] N/[name] Procedures Performed: Treatment Notes Electronic Signature(s) Signed: 08/20/2021 11:42:17 AM By: [name] Maudlin MD FACS Signed: 08/21/2021 5:38:18 PM By: Baruch Gouty RN, BSN Entered By: [name] Maudlin on 08/20/2021 11:42:17 -------------------------------------------------------------------------------- Multi-Disciplinary Care Plan Details Patient Name: Date of Service: [name], Ardeth 08/20/2021 9:00 [name] M Medical Record Number: [id number] Patient Account Number: [id number] Date of Birth/Sex: Treating RN: [birth year] (88 y.o. [name] Primary Care Taffie Eckmann: [name], [name] Kotyk Other Clinician: Referring Braycen Burandt: Treating Reynaldo Rossman/Extender: [name], [name] in Treatment: 0 Active Inactive Abuse / Safety / Falls / Self Care  Management Nursing Diagnoses: Impaired physical mobility Potential for falls Potential for injury related to falls Goals: Patient will remain injury free related to falls Date Initiated: 08/20/2021 Target Resolution Date: 09/10/2021 Goal Status: Active Patient/caregiver will demonstrate safe use of adaptive devices to increase mobility Date Initiated: 08/20/2021 Target Resolution Date: 09/10/2021 Goal Status: Active Patient/caregiver will verbalize understanding of skin care regimen Date Initiated: 08/20/2021 Target Resolution Date: 09/10/2021 Goal Status: Active Interventions: Assess fall risk on admission and as needed Assess impairment of mobility on admission and as needed per policy Provide education on fall prevention Notes: Wound/Skin Impairment Nursing Diagnoses: Impaired tissue integrity Knowledge deficit related to ulceration/compromised skin integrity Goals: Patient/caregiver will verbalize understanding of skin care regimen Date Initiated: 08/20/2021 Target Resolution Date: 09/10/2021 Goal Status: Active Ulcer/skin breakdown will have [name] volume reduction of 30% by week 4 Date Initiated: 08/20/2021 Target Resolution Date: 09/10/2021 Goal Status: Active Interventions: Assess patient/caregiver ability to perform ulcer/skin care regimen upon admission and as needed Assess ulceration(s) every visit Treatment Activities: Referred to DME Rolla Servidio for dressing supplies : 08/20/2021 Skin care regimen initiated : 08/20/2021 Topical wound management initiated : 08/20/2021 Notes: Electronic Signature(s) Signed: 08/20/2021 5:03:59 PM By: Adline Peals Entered By: Adline Peals on 08/20/2021 11:58:00 -------------------------------------------------------------------------------- Pain Assessment Details Patient Name: Date of Service: [name], Lorenza 08/20/2021 9:00 [name] M Medical Record Number: [id number] Patient Account Number: [id number] Date of Birth/Sex: Treating RN: 1932-06-15  (88 y.o. [name] Primary Care Lashun Ramseyer: [name], [name] Kotyk Other Clinician: Referring Myda Detwiler: Treating Fowler Antos/Extender: [name], [name] in Treatment: 0 Active Problems Location of Pain Severity and Description of Pain Patient Has Paino Yes Site Locations Pain Location: Pain in Ulcers Duration of the Pain. Constant / Intermittento Intermittent Rate the pain. Current Pain Level: 6 Character of Pain Describe the Pain: Throbbing Pain Management and Medication Current Pain Management: Medication: Yes How does your wound impact your activities of daily livingo Sleep: Yes Electronic Signature(s) Signed: 08/20/2021 5:03:59 PM By: Adline Peals Entered By: Adline Peals on 08/20/2021 10:18:16 -------------------------------------------------------------------------------- Patient/Caregiver Education Details Patient Name: Date of Service: [name], Tashona 5/2/2023andnbsp9:00 [name] M Medical Record Number: [id number] Patient Account Number: [id number] Date of Birth/Gender: Treating RN: [birth year] (88 y.o. [name] Primary Care Physician: [name], [name] Kotyk Other Clinician: Referring Physician: Treating Physician/Extender: [name], [name] in Treatment: 0 Education Assessment Education Provided To: Patient Education Topics Provided Wound/Skin Impairment: Methods: Explain/Verbal Responses: Reinforcements needed, State content correctly Electronic Signature(s) Signed: 08/20/2021 5:03:59 PM By: Adline Peals Entered By: Adline Peals on 08/20/2021 11:58:18 -------------------------------------------------------------------------------- Wound Assessment Details Patient Name: Date of Service: [name], Aline 08/20/2021 9:00 [name] M Medical Record Number: [id number] Patient Account Number: [id number] Date of Birth/Sex: Treating RN: 03-13-1933 (88  y.o. [name] Primary Care  Estalee Mccandlish: [name], [name] Kotyk Other Clinician: Referring Deavon Podgorski: Treating Rastus Borton/Extender: [name], [name] in Treatment: 0 Wound Status Wound Number: 1 Primary Etiology: Pressure Ulcer Wound Location: Left, Medial Foot Wound Status: Open Wounding Event: Gradually Appeared Comorbid History: Cataracts, Hypertension, Rheumatoid Arthritis Date Acquired: 05/22/2021 Weeks Of Treatment: 0 Clustered Wound: No Photos Wound Measurements Length: (cm) 2.2 Width: (cm) 2 Depth: (cm) 0.1 Area: (cm) 3.456 Volume: (cm) 0.346 % Reduction in Area: 0% % Reduction in Volume: 0% Epithelialization: None Tunneling: No Undermining: Yes Starting Position (o'clock): 11 Ending Position (o'clock): 7 Maximum Distance: (cm) 0.2 Wound Description Classification: Category/Stage III Wound Margin: Well defined, not attached Exudate Amount: Medium Exudate Type: Serosanguineous Exudate Color: red, brown Foul Odor After Cleansing: No Slough/Fibrino Yes Wound Bed Granulation Amount: Large (67-100%) Exposed Structure Granulation Quality: Red Fascia Exposed: No Necrotic Amount: Small (1-33%) Fat Layer (Subcutaneous Tissue) Exposed: Yes Necrotic Quality: Adherent Slough Tendon Exposed: No Muscle Exposed: No Joint Exposed: No Bone Exposed: No Electronic Signature(s) Signed: 08/20/2021 5:03:59 PM By: Adline Peals Signed: 08/21/2021 5:38:18 PM By: Baruch Gouty RN, BSN Entered By: Adline Peals on 08/20/2021 11:55:29 -------------------------------------------------------------------------------- Vitals Details Patient Name: Date of Service: [name], Jazelle 08/20/2021 9:00 [name] M Medical Record Number: [id number] Patient Account Number: [id number] Date of Birth/Sex: Treating RN: 03-31-1933 (88 y.o. [name] Primary Care Jakim Drapeau: [name], [name] Kotyk Other Clinician: Referring Oneka Parada: Treating Emalee Knies/Extender: [name], [name]  in Treatment: 0 Vital Signs Time Taken: 09:24 Temperature (F): 98.1 Height (in): 60 Pulse (bpm): 76 Source: Stated Respiratory Rate (breaths/min): 18 Weight (lbs): 119 Blood Pressure (mmHg): 135/68 Source: Stated Reference Range: 80 - 120 mg / dl Body Mass Index (BMI): 23.2 Electronic Signature(s) Signed: 10/01/2021 8:46:54 AM By: Erenest Blank Entered By: Erenest Blank on 08/20/2021 09:29:15

## 2021-10-02 ENCOUNTER — Other Ambulatory Visit (HOSPITAL_COMMUNITY): Payer: Self-pay | Admitting: General Surgery

## 2021-10-02 ENCOUNTER — Ambulatory Visit (HOSPITAL_COMMUNITY)
Admission: RE | Admit: 2021-10-02 | Discharge: 2021-10-02 | Disposition: A | Discharge status: Home / Self Care | Payer: Medicare Other | Source: Ambulatory Visit | Attending: General Surgery | Admitting: General Surgery

## 2021-10-02 ENCOUNTER — Encounter (HOSPITAL_BASED_OUTPATIENT_CLINIC_OR_DEPARTMENT_OTHER): Payer: Medicare Other | Admitting: General Surgery

## 2021-10-02 DIAGNOSIS — L97526 Non-pressure chronic ulcer of other part of left foot with bone involvement without evidence of necrosis: Secondary | ICD-10-CM

## 2021-10-02 DIAGNOSIS — L97529 Non-pressure chronic ulcer of other part of left foot with unspecified severity: Secondary | ICD-10-CM | POA: Diagnosis not present

## 2021-10-02 DIAGNOSIS — L89893 Pressure ulcer of other site, stage 3: Secondary | ICD-10-CM | POA: Diagnosis not present

## 2021-10-02 DIAGNOSIS — R600 Localized edema: Secondary | ICD-10-CM | POA: Diagnosis not present

## 2021-10-02 DIAGNOSIS — M069 Rheumatoid arthritis, unspecified: Secondary | ICD-10-CM | POA: Diagnosis not present

## 2021-10-02 DIAGNOSIS — M7989 Other specified soft tissue disorders: Secondary | ICD-10-CM | POA: Diagnosis not present

## 2021-10-02 DIAGNOSIS — M868X7 Other osteomyelitis, ankle and foot: Secondary | ICD-10-CM | POA: Diagnosis not present

## 2021-10-02 MED ORDER — GADOBUTROL 1 MMOL/ML IV SOLN: 5.0000 mL | Freq: Once | INTRAVENOUS | Status: DC | PRN

## 2021-10-02 NOTE — Progress Notes (Addendum)
ELLEY, HARP ([id number]) Visit Report for 10/02/2021 Chief Complaint Document Details Patient Name: Date of Service: [name], [name] 10/02/2021 8:30 [name] M Medical Record Number: [id number] Patient Account Number: [id number] Date of Birth/Sex: Treating RN: 1933-02-15 (88 y.o. [name] Primary Care Provider: Dettinger, [name] Kotyk Other Clinician: Referring Provider: Treating Provider/Extender: [name] [name], [name] Sims in Treatment: 6 Information Obtained from: Patient Chief Complaint Patient is at the clinic for treatment of an open ulcer on her left medial first metatarsal head, secondary to friction and pressure. Electronic Signature(s) Signed: 10/02/2021 9:03:08 AM By: [name] Maudlin MD FACS Entered By: [name] on 10/02/2021 09:03:08 -------------------------------------------------------------------------------- Debridement Details Patient Name: Date of Service: [name], [name] 10/02/2021 8:30 [name] M Medical Record Number: [id number] Patient Account Number: [id number] Date of Birth/Sex: Treating RN: 05-Jan-1933 (88 y.o. [name] Primary Care Provider: Dettinger, [name] Kotyk Other Clinician: Referring Provider: Treating Provider/Extender: [name] [name], [name] Sims in Treatment: 6 Debridement Performed for Assessment: Wound #1 Left,Medial Foot Performed By: Physician [name] Maudlin, MD Debridement Type: Debridement Level of Consciousness (Pre-procedure): Awake and Alert Pre-procedure Verification/Time Out Yes - 08:57 Taken: Start Time: 08:57 Pain Control: Lidocaine 4% T opical Solution T Area Debrided (L x W): otal 1.5 (cm) x 1.7 (cm) = 2.55 (cm) Tissue and other material debrided: Non-Viable, Slough, Slough Level: Non-Viable Tissue Debridement Description: Selective/Open Wound Instrument: Curette Bleeding: Minimum Hemostasis Achieved: Pressure Procedural Pain: 0 Post Procedural Pain: 0 Response to Treatment:  Procedure was tolerated well Level of Consciousness (Post- Awake and Alert procedure): Post Debridement Measurements of Total Wound Length: (cm) 1.5 Stage: Category/Stage III Width: (cm) 1.7 Depth: (cm) 0.1 Volume: (cm) 0.2 Character of Wound/Ulcer Post Debridement: Improved Post Procedure Diagnosis Same as Pre-procedure Electronic Signature(s) Signed: 10/02/2021 9:11:58 AM By: [name] Maudlin MD FACS Signed: 10/02/2021 5:19:00 PM By: [name] Entered By: [name] on 10/02/2021 08:59:24 -------------------------------------------------------------------------------- HPI Details Patient Name: Date of Service: [name], [name] 10/02/2021 8:30 [name] M Medical Record Number: [id number] Patient Account Number: [id number] Date of Birth/Sex: Treating RN: 03/14/1933 (88 y.o. [name] Primary Care Provider: Dettinger, [name] Kotyk Other Clinician: Referring Provider: Treating Provider/Extender: [name] [name], [name] Sims in Treatment: 6 History of Present Illness HPI Description: ADMISSION 08/20/2021 This is an 88 year old Spanish-speaking patient who is being evaluated for an ulcer on the medial aspect of the left first metatarsal head. She says that in February, her shoes were rubbing against [name] bunion in this location and she first developed [name] blister that ultimately broke down to an ulcer. She is not [name] diabetic and she does not smoke. She does have some high blood pressure as well as rheumatoid arthritis. ABI in clinic was noncompressible. When the wound first appeared, she was seen in urgent care and was started on Bactrim. Her PCP followed up and recommended daily Xeroform gauze dressing changes. Initially, it sounds like the wound was quite painful but it is now quite improved. Apparently she had been changing the dressing twice [name] day at [name] minimum, and per her PCP's note she may be scrubbing it quite aggressively up to 4 times [name] day. Today, the  wound is clean, but there is perimeter of undermining. I am not entirely sure that I am probing bone but we are certainly very close to it. The surface is [name] little bit pale but otherwise healthy. No odor or concern for infection. 08/27/2021: The wound is about the same today. There is [name] little bit more undermining underneath  the callus that is formed around the perimeter. The wound surface is pink and moist. No odor or significant drainage. 09/04/2021: The wound is the same size today. It is very clean and the degree of undermining is about the same. The surface is [name] little bit hypertrophic. We have been using silver collagen and the patient's son reports that they have been making [name] concerted effort to tuck the dressing into the undermined area of tissue. No concern for infection. 09/11/2021: The wound is really unchanged with perhaps [name] little bit less undermining. It is clean and they have been using silver collagen, taking care to tuck it into the undermined area. I thought we had applied for snap VAC approval last week, but for some reason we are unable to find this. She does say that the degree of pain she is experiencing is less and that it sometimes just throbs at night but is better than previously. 09/18/2021: Last week, due to lack of progression with the wound, I took [name] culture that grew out skin flora but I also got an x-ray that was suggestive of osteomyelitis. She continues to complain of pain and throbbing at night but nothing throughout the day. She has been approved for snap VAC use with [name] $70- $100 co-pay with each application. The wound itself really looks no different today. 09/25/2021: I initiated doxycycline last week and she has been taking this. We also applied the snap VAC. The wound looks markedly better today with significant closure of the undermined portion. She continues to have some throbbing pain at night, but otherwise is doing well. We ordered an MRI to get [name] more  definitive answer regarding the possibility of osteomyelitis, but they have not yet received an appointment for this. 10/02/2021: She had her MRI just prior to coming to clinic so I do not yet have any results. She continues to have throbbing pain at night. The wound itself is [name] bit smaller. There is some periwound moisture with [name] little bit of slough but the undermined area is almost closed and I am unable to probe to bone. Electronic Signature(s) Signed: 10/02/2021 9:04:08 AM By: [name] Maudlin MD FACS Entered By: [name] on 10/02/2021 09:04:08 -------------------------------------------------------------------------------- Physical Exam Details Patient Name: Date of Service: [name], [name] 10/02/2021 8:30 [name] M Medical Record Number: [id number] Patient Account Number: [id number] Date of Birth/Sex: Treating RN: 12-22-1932 (88 y.o. [name] Primary Care Provider: Dettinger, [name] Kotyk Other Clinician: Referring Provider: Treating Provider/Extender: [name] [name], [name] Sims in Treatment: 6 Constitutional Hypertensive, asymptomatic. . . . No acute distress.Marland Kitchen Respiratory Normal work of breathing on room air.. Notes 10/02/2021: The wound is [name] bit smaller. There is some periwound moisture with [name] little bit of slough but the undermined area is almost closed and I am unable to probe to bone. Electronic Signature(s) Signed: 10/02/2021 9:06:59 AM By: [name] Maudlin MD FACS Entered By: [name] on 10/02/2021 09:06:59 -------------------------------------------------------------------------------- Physician Orders Details Patient Name: Date of Service: [name], [name] 10/02/2021 8:30 [name] M Medical Record Number: [id number] Patient Account Number: [id number] Date of Birth/Sex: Treating RN: [birth year] (88 y.o. [name] Primary Care Provider: Dettinger, [name] Kotyk Other Clinician: Referring Provider: Treating Provider/Extender: [name] [name], [name] Sims in Treatment: 6 Verbal / Phone Orders: No Diagnosis Coding ICD-10 Coding Code Description [phone] Non-pressure chronic ulcer of other part of left foot with bone involvement without evidence of necrosis Follow-up Appointments ppointment in 1 week. - Dr. Celine Ahr Room 2 -  6/22 at 10:00 AM Return [name] Bathing/ Shower/ Hygiene May shower with protection but do not get wound dressing(s) wet. Negative Presssure Wound Therapy Wound #1 Left,Medial Foot SNAP Vac to wound continuously at 179m/hg pressure Off-Loading Wound #1 Left,Medial Foot Open toe surgical shoe to: - L foot Wound Treatment Wound #1 - Foot Wound Laterality: Left, Medial Cleanser: Soap and Water 1 x Per Day/15 Days Discharge Instructions: May shower and wash wound with dial antibacterial soap and water prior to dressing change. Cleanser: Wound Cleanser 1 x Per Day/15 Days Discharge Instructions: Cleanse the wound with wound cleanser prior to applying [name] clean dressing using gauze sponges, not tissue or cotton balls. Prim Dressing: snap vac 1 x Per Day/15 Days ary Discharge Instructions: SNAP vac as directed. Consults Infectious Disease - osteomyelitis confirmed by MRI - evaluate for antibiotic therapy - (ICD10 L97.526 - Non-pressure chronic ulcer of other part of left foot with bone involvement without evidence of necrosis) Electronic Signature(s) Signed: 10/04/2021 11:07:28 AM By: CFredirick MaudlinMD FACS Signed: 10/04/2021 5:15:13 PM By: HAdline PealsPrevious Signature: 10/02/2021 9:11:58 AM Version By: CFredirick MaudlinMD FACS Entered By: HAdline Pealson 10/04/2021 11:00:20 Prescription 10/02/2021 -------------------------------------------------------------------------------- AHaydee MonicaMD Patient Name: Provider: [id number] of Birth: NPI#:Wanda [id number]: DEA #: [phone] #: License #: [place] Address: [address] [place] [address] [phone] penicillin G; lisinopril Provider's Orders Infectious Disease - ICD10: L97.526 - osteomyelitis confirmed by MRI - evaluate for antibiotic therapy Hand Signature: Date(s): Electronic Signature(s) Signed: 10/04/2021 11:07:28 AM By: CFredirick MaudlinMD FACS Signed: 10/04/2021 5:15:13 PM By: HAdline PealsEntered By: HAdline Pealson 10/04/2021 11:00:21 -------------------------------------------------------------------------------- Problem List Details Patient Name: Date of Service: [name], [name] 10/02/2021 8:30 [name] M Medical Record Number: [id number] Account Number: [id number] of Birth/Sex: Treating RN: 1Feb 08, 1934(88y.o. FHarlow OhmsPrimary Care Provider: Dettinger, JVonna KotykOther Clinician: Referring Provider: Treating Provider/Extender: CFredirick MaudlinDettinger, JJory Simsin Treatment: 6 Active Problems ICD-10 Encounter Code Description Active Date MDM Diagnosis L97.526 Non-pressure chronic ulcer of other part of left foot with bone involvement 08/20/2021 No Yes without evidence of necrosis Inactive Problems Resolved Problems Electronic Signature(s) Signed: 10/02/2021 9:02:51 AM By: CFredirick MaudlinMD FACS Entered By: CFredirick Maudlinon 10/02/2021 09:02:51 -------------------------------------------------------------------------------- Progress Note Details Patient Name: Date of Service: [name], [name] 10/02/2021 8:30 [name] M Medical Record Number: [id number] Account Number: [id number] of Birth/Sex: Treating RN: 1March 31, 1934(88y.o. FHarlow OhmsPrimary Care Provider: Dettinger, JVonna KotykOther Clinician: Referring Provider: Treating Provider/Extender: CFredirick MaudlinDettinger, JJory Simsin Treatment: 6 Subjective Chief Complaint Information obtained from  Patient Patient is at the clinic for treatment of an open ulcer on her left medial first metatarsal head, secondary to friction and pressure. History of Present Illness (HPI) ADMISSION 08/20/2021 This is an 88year old Spanish-speaking patient who is being evaluated for an ulcer on the medial aspect of the left first metatarsal head. She says that in February, her shoes were rubbing against [name] bunion in this location and she first developed [name] blister that ultimately broke down to an ulcer. She is not [name] diabetic and she does not smoke. She does have some high blood pressure as well as rheumatoid arthritis. ABI in clinic was noncompressible. When the wound first appeared, she was seen in urgent care and was started  on Bactrim. Her PCP followed up and recommended daily Xeroform gauze dressing changes. Initially, it sounds like the wound was quite painful but it is now quite improved. Apparently she had been changing the dressing twice [name] day at [name] minimum, and per her PCP's note she may be scrubbing it quite aggressively up to 4 times [name] day. Today, the wound is clean, but there is perimeter of undermining. I am not entirely sure that I am probing bone but we are certainly very close to it. The surface is [name] little bit pale but otherwise healthy. No odor or concern for infection. 08/27/2021: The wound is about the same today. There is [name] little bit more undermining underneath the callus that is formed around the perimeter. The wound surface is pink and moist. No odor or significant drainage. 09/04/2021: The wound is the same size today. It is very clean and the degree of undermining is about the same. The surface is [name] little bit hypertrophic. We have been using silver collagen and the patient's son reports that they have been making [name] concerted effort to tuck the dressing into the undermined area of tissue. No concern for infection. 09/11/2021: The wound is really unchanged with perhaps [name] little bit less  undermining. It is clean and they have been using silver collagen, taking care to tuck it into the undermined area. I thought we had applied for snap VAC approval last week, but for some reason we are unable to find this. She does say that the degree of pain she is experiencing is less and that it sometimes just throbs at night but is better than previously. 09/18/2021: Last week, due to lack of progression with the wound, I took [name] culture that grew out skin flora but I also got an x-ray that was suggestive of osteomyelitis. She continues to complain of pain and throbbing at night but nothing throughout the day. She has been approved for snap VAC use with [name] $70- $100 co-pay with each application. The wound itself really looks no different today. 09/25/2021: I initiated doxycycline last week and she has been taking this. We also applied the snap VAC. The wound looks markedly better today with significant closure of the undermined portion. She continues to have some throbbing pain at night, but otherwise is doing well. We ordered an MRI to get [name] more definitive answer regarding the possibility of osteomyelitis, but they have not yet received an appointment for this. 10/02/2021: She had her MRI just prior to coming to clinic so I do not yet have any results. She continues to have throbbing pain at night. The wound itself is [name] bit smaller. There is some periwound moisture with [name] little bit of slough but the undermined area is almost closed and I am unable to probe to bone. Patient History Information obtained from Patient. Family History Cancer - Siblings, No family history of Diabetes, Heart Disease, Hereditary Spherocytosis, Hypertension, Kidney Disease, Lung Disease, Seizures, Stroke, Thyroid Problems, Tuberculosis. Social History Never smoker, Marital Status - Married, Alcohol Use - Never, Drug Use - No History, Caffeine Use - Daily. Medical History Eyes Patient has history of  Cataracts Cardiovascular Patient has history of Hypertension Musculoskeletal Patient has history of Rheumatoid Arthritis - hands and feet Hospitalization/Surgery History - cataract surgery. Medical [name] Surgical History Notes nd Eyes macular degeneration Ear/Nose/Mouth/Throat GERD, dysarthria Hematologic/Lymphatic dyslipidemia Cardiovascular palpitations Musculoskeletal osteoporosis, unsteady gait Neurologic TIA Objective Constitutional Hypertensive, asymptomatic. No acute distress.. Vitals Time Taken: 8:44 AM, Height: 60 in,  Weight: 119 lbs, BMI: 23.2, Temperature: 98.2 F, Pulse: 71 bpm, Respiratory Rate: 18 breaths/min, Blood Pressure: 162/72 mmHg. Respiratory Normal work of breathing on room air.. General Notes: 10/02/2021: The wound is [name] bit smaller. There is some periwound moisture with [name] little bit of slough but the undermined area is almost closed and I am unable to probe to bone. Integumentary (Hair, Skin) Wound #1 status is Open. Original cause of wound was Gradually Appeared. The date acquired was: 05/22/2021. The wound has been in treatment 6 weeks. The wound is located on the Left,Medial Foot. The wound measures 1.5cm length x 1.7cm width x 0.1cm depth; 2.003cm^2 area and 0.2cm^3 volume. There is Fat Layer (Subcutaneous Tissue) exposed. There is no tunneling or undermining noted. There is [name] medium amount of serosanguineous drainage noted. The wound margin is well defined and not attached to the wound base. There is large (67-100%) red granulation within the wound bed. There is no necrotic tissue within the wound bed. Assessment Active Problems ICD-10 Non-pressure chronic ulcer of other part of left foot with bone involvement without evidence of necrosis Procedures Wound #1 Pre-procedure diagnosis of Wound #1 is [name] Pressure Ulcer located on the Left,Medial Foot . There was [name] Selective/Open Wound Non-Viable Tissue Debridement with [name] total area of 2.55 sq cm performed by  [name] Maudlin, MD. With the following instrument(s): Curette to remove Non-Viable tissue/material. Material removed includes [facility] after achieving pain control using Lidocaine 4% Topical Solution. No specimens were taken. [name] time out was conducted at 08:57, prior to the start of the procedure. [name] Minimum amount of bleeding was controlled with Pressure. The procedure was tolerated well with [name] pain level of 0 throughout and [name] pain level of 0 following the procedure. Post Debridement Measurements: 1.5cm length x 1.7cm width x 0.1cm depth; 0.2cm^3 volume. Post debridement Stage noted as Category/Stage III. Character of Wound/Ulcer Post Debridement is improved. Post procedure Diagnosis Wound #1: Same as Pre-Procedure Plan Follow-up Appointments: Return Appointment in 1 week. - Dr. Celine Ahr Room 2 - 6/22 at 10:00 AM Bathing/ Shower/ Hygiene: May shower with protection but do not get wound dressing(s) wet. Negative Presssure Wound Therapy: Wound #1 Left,Medial Foot: SNAP Vac to wound continuously at 122m/hg pressure Off-Loading: Wound #1 Left,Medial Foot: Open toe surgical shoe to: - L foot WOUND #1: - Foot Wound Laterality: Left, Medial Cleanser: Soap and Water 1 x Per Day/15 Days Discharge Instructions: May shower and wash wound with dial antibacterial soap and water prior to dressing change. Cleanser: Wound Cleanser 1 x Per Day/15 Days Discharge Instructions: Cleanse the wound with wound cleanser prior to applying [name] clean dressing using gauze sponges, not tissue or cotton balls. Prim Dressing: snap vac 1 x Per Day/15 Days ary Discharge Instructions: SNAP vac as directed. 10/02/2021: The wound is [name] bit smaller. There is some periwound moisture with [name] little bit of slough but the undermined area is almost closed and I am unable to probe to bone. I used [name] curette to debride the slough from the wound. We will continue using the snap VAC. Once I have the results of the MRI, I will make [name]  decision regarding any additional antibiotic therapy. We will contact the patient with those findings. I will see her back in 1 week. Electronic Signature(s) Signed: 10/02/2021 9:10:27 AM By: CFredirick MaudlinMD FACS Entered By: CFredirick Maudlinon 10/02/2021 09:10:27 -------------------------------------------------------------------------------- HxROS Details Patient Name: Date of Service: [name], [name] 10/02/2021 8:30 [name] M Medical Record Number: [id number]  Patient Account Number: [id number] Date of Birth/Sex: Treating RN: Apr 18, 1933 (88 y.o. [name] Primary Care Provider: Dettinger, [name] Kotyk Other Clinician: Referring Provider: Treating Provider/Extender: [name] [name], [name] Sims in Treatment: 6 Information Obtained From Patient Eyes Medical History: Positive for: Cataracts Past Medical History Notes: macular degeneration Ear/Nose/Mouth/Throat Medical History: Past Medical History Notes: GERD, dysarthria Hematologic/Lymphatic Medical History: Past Medical History Notes: dyslipidemia Cardiovascular Medical History: Positive for: Hypertension Past Medical History Notes: palpitations Musculoskeletal Medical History: Positive for: Rheumatoid Arthritis - hands and feet Past Medical History Notes: osteoporosis, unsteady gait Neurologic Medical History: Past Medical History Notes: TIA HBO Extended History Items Eyes: Cataracts Immunizations Pneumococcal Vaccine: Received Pneumococcal Vaccination: Yes Received Pneumococcal Vaccination On or After 60th Birthday: Yes Implantable Devices None Hospitalization / Surgery History Type of Hospitalization/Surgery cataract surgery Family and Social History Cancer: Yes - Siblings; Diabetes: No; Heart Disease: No; Hereditary Spherocytosis: No; Hypertension: No; Kidney Disease: No; Lung Disease: No; Seizures: No; Stroke: No; Thyroid Problems: No; Tuberculosis: No; Never smoker; Marital Status -  Married; Alcohol Use: Never; Drug Use: No History; Caffeine Use: Daily; Financial Concerns: No; Food, Clothing or Shelter Needs: No; Support System Lacking: No; Transportation Concerns: No Electronic Signature(s) Signed: 10/02/2021 9:11:58 AM By: [name] Maudlin MD FACS Signed: 10/02/2021 5:19:00 PM By: [name] Entered By: [name] on 10/02/2021 09:04:25 -------------------------------------------------------------------------------- SuperBill Details Patient Name: Date of Service: [name], [name] 10/02/2021 Medical Record Number: [id number] Patient Account Number: [id number] Date of Birth/Sex: Treating RN: [birth year] (88 y.o. [name] Primary Care Provider: Dettinger, [name] Kotyk Other Clinician: Referring Provider: Treating Provider/Extender: [name] [name], [name] Sims in Treatment: 6 Diagnosis Coding ICD-10 Codes Code Description [id number] Non-pressure chronic ulcer of other part of left foot with bone involvement without evidence of necrosis Facility Procedures CPT4 Code: [id number] Description: 0932 - DEBRIDE WOUND 1ST 20 SQ CM OR < ICD-10 Diagnosis Description L97.526 Non-pressure chronic ulcer of other part of left foot with bone involvement witho Modifier: ut evidence of necr Quantity: 1 osis Physician Procedures : CPT4 Code Description Modifier [id number] - WC PHYS LEVEL 3 - EST PT 25 ICD-10 Diagnosis Description L97.526 Non-pressure chronic ulcer of other part of left foot with bone involvement without evidence of necro Quantity: 1 sis Electronic Signature(s) Signed: 10/02/2021 9:10:41 AM By: [name] Maudlin MD FACS Entered By: [name] on 10/02/2021 09:10:41

## 2021-10-02 NOTE — Progress Notes (Signed)
[name], [name] ([id number]) Visit Report for 10/02/2021 Arrival Information Details Patient Name: Date of Service: [name], [name] 10/02/2021 8:30 A M Medical Record Number: [id number] Patient Account Number: [id number] Date of Birth/Sex: Treating RN: 02-Aug-1932 (88 y.o. [name] Primary Care Emmani Lesueur: Dettinger, Vonna Kotyk Other Clinician: Referring Koula Venier: Treating Yasmin Bronaugh/Extender: Fredirick Maudlin Dettinger, [name] in Treatment: 6 Visit Information History Since Last Visit Has Dressing in Place as Prescribed: Yes Patient Arrived: Walker Pain Present Now: Yes Arrival Time: 08:39 Accompanied By: son Transfer Assistance: None Patient Identification Verified: Yes Secondary Verification Process Completed: Yes Patient Requires Transmission-Based Precautions: No Electronic Signature(s) Signed: 10/02/2021 5:19:00 PM By: Adline Peals Entered By: Adline Peals on 10/02/2021 08:44:35 -------------------------------------------------------------------------------- Encounter Discharge Information Details Patient Name: Date of Service: [name], [name] 10/02/2021 8:30 A M Medical Record Number: [id number] Patient Account Number: [id number] Date of Birth/Sex: Treating RN: [birth year] (88 y.o. [name] Primary Care Mclane Arora: Dettinger, Vonna Kotyk Other Clinician: Referring Kaelei Wheeler: Treating Myra Weng/Extender: Fredirick Maudlin Dettinger, [name] in Treatment: 6 Encounter Discharge Information Items Post Procedure Vitals Discharge Condition: Stable Temperature (F): 98.2 Ambulatory Status: Walker Pulse (bpm): 71 Discharge Destination: Home Respiratory Rate (breaths/min): 18 Transportation: Private Auto Blood Pressure (mmHg): 162/72 Accompanied By: son Schedule Follow-up Appointment: Yes Clinical Summary of Care: Patient Declined Electronic Signature(s) Signed: 10/02/2021 5:19:00 PM By: Adline Peals Entered By: Adline Peals on  10/02/2021 09:00:48 -------------------------------------------------------------------------------- Lower Extremity Assessment Details Patient Name: Date of Service: [name], [name] 10/02/2021 8:30 A M Medical Record Number: [id number] Patient Account Number: [id number] Date of Birth/Sex: Treating RN: 1932-11-25 (88 y.o. [name] Primary Care Turhan Chill: Dettinger, Vonna Kotyk Other Clinician: Referring Taylin Mans: Treating Caige Almeda/Extender: Fredirick Maudlin Dettinger, [name] in Treatment: 6 Edema Assessment Assessed: Shirlyn Goltz: No] Patrice Paradise: No] E[Left: dema] [Right: :] Calf Left: Right: Point of Measurement: 33 cm From Medial Instep 30.1 cm Ankle Left: Right: Point of Measurement: 9 cm From Medial Instep 22.3 cm Vascular Assessment Pulses: Dorsalis Pedis Palpable: [Left:Yes] Electronic Signature(s) Signed: 10/02/2021 5:19:00 PM By: Adline Peals Entered By: Adline Peals on 10/02/2021 08:48:16 -------------------------------------------------------------------------------- Multi Wound Chart Details Patient Name: Date of Service: [name], [name] 10/02/2021 8:30 A M Medical Record Number: [id number] Patient Account Number: [id number] Date of Birth/Sex: Treating RN: 04/12/1933 (88 y.o. [name] Primary Care Swannie Milius: Dettinger, Vonna Kotyk Other Clinician: Referring Jamaul Heist: Treating Shakiyla Kook/Extender: Fredirick Maudlin Dettinger, [name] in Treatment: 6 Vital Signs Height(in): 60 Pulse(bpm): 71 Weight(lbs): 119 Blood Pressure(mmHg): 162/72 Body Mass Index(BMI): 23.2 Temperature(F): 98.2 Respiratory Rate(breaths/min): 18 Photos: [N/A:N/A] Left, Medial Foot N/A N/A Wound Location: Gradually Appeared N/A N/A Wounding Event: Pressure Ulcer N/A N/A Primary Etiology: Cataracts, Hypertension, Rheumatoid N/A N/A Comorbid History: Arthritis 05/22/2021 N/A N/A Date Acquired: 6 N/A N/A Weeks of Treatment: Open N/A N/A Wound  Status: No N/A N/A Wound Recurrence: 1.5x1.7x0.1 N/A N/A Measurements L x W x D (cm) 2.003 N/A N/A A (cm) : rea 0.2 N/A N/A Volume (cm) : 42.00% N/A N/A % Reduction in A rea: 42.20% N/A N/A % Reduction in Volume: Category/Stage III N/A N/A Classification: Medium N/A N/A Exudate A mount: Serosanguineous N/A N/A Exudate Type: red, brown N/A N/A Exudate Color: Well defined, not attached N/A N/A Wound Margin: Large (67-100%) N/A N/A Granulation A mount: Red N/A N/A Granulation Quality: None Present (0%) N/A N/A Necrotic A mount: Fat Layer (Subcutaneous Tissue): Yes N/A N/A Exposed Structures: Fascia: No Tendon: No Muscle: No Joint: No Bone: No Small (1-33%) N/A N/A Epithelialization: Debridement -  Selective/Open Wound N/A N/A Debridement: Pre-procedure Verification/Time Out 08:57 N/A N/A Taken: Lidocaine 4% Topical Solution N/A N/A Pain Control: Slough N/A N/A Tissue Debrided: Non-Viable Tissue N/A N/A Level: 2.55 N/A N/A Debridement A (sq cm): rea Curette N/A N/A Instrument: Minimum N/A N/A Bleeding: Pressure N/A N/A Hemostasis A chieved: 0 N/A N/A Procedural Pain: 0 N/A N/A Post Procedural Pain: Procedure was tolerated well N/A N/A Debridement Treatment Response: 1.5x1.7x0.1 N/A N/A Post Debridement Measurements L x W x D (cm) 0.2 N/A N/A Post Debridement Volume: (cm) Category/Stage III N/A N/A Post Debridement Stage: Debridement N/A N/A Procedures Performed: Treatment Notes Wound #1 (Foot) Wound Laterality: Left, Medial Cleanser Soap and Water Discharge Instruction: May shower and wash wound with dial antibacterial soap and water prior to dressing change. Wound Cleanser Discharge Instruction: Cleanse the wound with wound cleanser prior to applying a clean dressing using gauze sponges, not tissue or cotton balls. Peri-Wound Care Topical Primary Dressing snap vac Discharge Instruction: SNAP vac as directed. Secondary  Dressing Secured With Compression Wrap Compression Stockings Add-Ons Electronic Signature(s) Signed: 10/02/2021 9:03:00 AM By: Fredirick Maudlin MD FACS Signed: 10/02/2021 5:19:00 PM By: Sabas Sous By: Fredirick Maudlin on 10/02/2021 09:03:00 -------------------------------------------------------------------------------- Multi-Disciplinary Care Plan Details Patient Name: Date of Service: [name], Sadia 10/02/2021 8:30 A M Medical Record Number: [id number] Patient Account Number: [id number] Date of Birth/Sex: Treating RN: [birth year] (88 y.o. [name] Primary Care Pierson Vantol: Dettinger, Vonna Kotyk Other Clinician: Referring Keshonda Monsour: Treating Ryland Smoots/Extender: Fredirick Maudlin Dettinger, [name] in Treatment: 6 Active Inactive Abuse / Safety / Falls / Self Care Management Nursing Diagnoses: Impaired physical mobility Potential for falls Potential for injury related to falls Goals: Patient will remain injury free related to falls Date Initiated: 08/20/2021 Target Resolution Date: 10/11/2021 Goal Status: Active Patient/caregiver will demonstrate safe use of adaptive devices to increase mobility Date Initiated: 08/20/2021 Date Inactivated: 09/11/2021 Target Resolution Date: 10/11/2021 Goal Status: Met Patient/caregiver will verbalize understanding of skin care regimen Date Initiated: 08/20/2021 Date Inactivated: 09/11/2021 Target Resolution Date: 10/11/2021 Goal Status: Met Interventions: Assess fall risk on admission and as needed Assess impairment of mobility on admission and as needed per policy Provide education on fall prevention Notes: Wound/Skin Impairment Nursing Diagnoses: Impaired tissue integrity Knowledge deficit related to ulceration/compromised skin integrity Goals: Patient/caregiver will verbalize understanding of skin care regimen Date Initiated: 08/20/2021 Date Inactivated: 09/11/2021 Target Resolution Date: 09/10/2021 Goal Status:  Met Ulcer/skin breakdown will have a volume reduction of 30% by week 4 Date Initiated: 08/20/2021 Target Resolution Date: 10/11/2021 Goal Status: Active Interventions: Assess patient/caregiver ability to perform ulcer/skin care regimen upon admission and as needed Assess ulceration(s) every visit Treatment Activities: Referred to DME Daylen Hack for dressing supplies : 08/20/2021 Skin care regimen initiated : 08/20/2021 Topical wound management initiated : 08/20/2021 Notes: Electronic Signature(s) Signed: 10/02/2021 5:19:00 PM By: Adline Peals Entered By: Adline Peals on 10/02/2021 08:51:12 -------------------------------------------------------------------------------- Pain Assessment Details Patient Name: Date of Service: [name], [name] 10/02/2021 8:30 A M Medical Record Number: [id number] Patient Account Number: [id number] Date of Birth/Sex: Treating RN: 01-14-1933 (88 y.o. [name] Primary Care Lyam Provencio: Dettinger, Vonna Kotyk Other Clinician: Referring Idabell Picking: Treating Nella Botsford/Extender: Fredirick Maudlin Dettinger, [name] in Treatment: 6 Active Problems Location of Pain Severity and Description of Pain Patient Has Paino Yes Site Locations Duration of the Pain. Constant / Intermittento Constant Rate the pain. Current Pain Level: 10 Character of Pain Describe the Pain: Throbbing Pain Management and Medication Current Pain Management: Medication: Yes Electronic Signature(s) Signed:  10/02/2021 5:19:00 PM By: Sabas Sous By: Adline Peals on 10/02/2021 08:45:38 -------------------------------------------------------------------------------- Patient/Caregiver Education Details Patient Name: Date of Service: [name], [name] 6/14/2023andnbsp8:30 A M Medical Record Number: [id number] Patient Account Number: [id number] Date of Birth/Gender: Treating RN: Jun 02, 1932 (88 y.o. [name] Primary Care Physician: Dettinger,  Vonna Kotyk Other Clinician: Referring Physician: Treating Physician/Extender: Fredirick Maudlin Dettinger, [name] in Treatment: 6 Education Assessment Education Provided To: Patient Education Topics Provided Wound/Skin Impairment: Methods: Explain/Verbal Responses: Reinforcements needed, State content correctly Electronic Signature(s) Signed: 10/02/2021 5:19:00 PM By: Adline Peals Entered By: Adline Peals on 10/02/2021 08:51:26 -------------------------------------------------------------------------------- Wound Assessment Details Patient Name: Date of Service: [name], [name] 10/02/2021 8:30 A M Medical Record Number: [id number] Patient Account Number: [id number] Date of Birth/Sex: Treating RN: [birth year] (88 y.o. [name] Primary Care Lexington Krotz: Dettinger, Vonna Kotyk Other Clinician: Referring Thursa Emme: Treating Omara Alcon/Extender: Fredirick Maudlin Dettinger, [name] in Treatment: 6 Wound Status Wound Number: 1 Primary Etiology: Pressure Ulcer Wound Location: Left, Medial Foot Wound Status: Open Wounding Event: Gradually Appeared Comorbid History: Cataracts, Hypertension, Rheumatoid Arthritis Date Acquired: 05/22/2021 Weeks Of Treatment: 6 Clustered Wound: No Photos Wound Measurements Length: (cm) 1.5 Width: (cm) 1.7 Depth: (cm) 0.1 Area: (cm) 2.003 Volume: (cm) 0.2 % Reduction in Area: 42% % Reduction in Volume: 42.2% Epithelialization: Small (1-33%) Tunneling: No Undermining: No Wound Description Classification: Category/Stage III Wound Margin: Well defined, not attached Exudate Amount: Medium Exudate Type: Serosanguineous Exudate Color: red, brown Foul Odor After Cleansing: No Slough/Fibrino No Wound Bed Granulation Amount: Large (67-100%) Exposed Structure Granulation Quality: Red Fascia Exposed: No Necrotic Amount: None Present (0%) Fat Layer (Subcutaneous Tissue) Exposed: Yes Tendon Exposed: No Muscle Exposed:  No Joint Exposed: No Bone Exposed: No Treatment Notes Wound #1 (Foot) Wound Laterality: Left, Medial Cleanser Soap and Water Discharge Instruction: May shower and wash wound with dial antibacterial soap and water prior to dressing change. Wound Cleanser Discharge Instruction: Cleanse the wound with wound cleanser prior to applying a clean dressing using gauze sponges, not tissue or cotton balls. Peri-Wound Care Topical Primary Dressing snap vac Discharge Instruction: SNAP vac as directed. Secondary Dressing Secured With Compression Wrap Compression Stockings Add-Ons Electronic Signature(s) Signed: 10/02/2021 5:19:00 PM By: Adline Peals Entered By: Adline Peals on 10/02/2021 08:50:49 -------------------------------------------------------------------------------- Vitals Details Patient Name: Date of Service: [name], Anayansi 10/02/2021 8:30 A M Medical Record Number: [id number] Patient Account Number: [id number] Date of Birth/Sex: Treating RN: 03-02-1933 (88 y.o. [name] Primary Care Kaprice Kage: Dettinger, Vonna Kotyk Other Clinician: Referring Mackinley Kiehn: Treating Shereese Bonnie/Extender: Fredirick Maudlin Dettinger, [name] in Treatment: 6 Vital Signs Time Taken: 08:44 Temperature (F): 98.2 Height (in): 60 Pulse (bpm): 71 Weight (lbs): 119 Respiratory Rate (breaths/min): 18 Body Mass Index (BMI): 23.2 Blood Pressure (mmHg): 162/72 Reference Range: 80 - 120 mg / dl Electronic Signature(s) Signed: 10/02/2021 5:19:00 PM By: Adline Peals Entered By: Adline Peals on 10/02/2021 08:45:07

## 2021-10-10 ENCOUNTER — Encounter (HOSPITAL_BASED_OUTPATIENT_CLINIC_OR_DEPARTMENT_OTHER): Payer: Medicare Other | Admitting: General Surgery

## 2021-10-10 DIAGNOSIS — L97526 Non-pressure chronic ulcer of other part of left foot with bone involvement without evidence of necrosis: Secondary | ICD-10-CM | POA: Diagnosis not present

## 2021-10-10 DIAGNOSIS — L89893 Pressure ulcer of other site, stage 3: Secondary | ICD-10-CM | POA: Diagnosis not present

## 2021-10-10 DIAGNOSIS — L97522 Non-pressure chronic ulcer of other part of left foot with fat layer exposed: Secondary | ICD-10-CM | POA: Diagnosis not present

## 2021-10-10 DIAGNOSIS — M069 Rheumatoid arthritis, unspecified: Secondary | ICD-10-CM | POA: Diagnosis not present

## 2021-10-10 NOTE — Progress Notes (Signed)
ADELYNN, GIPE ([id number]) Visit Report for 10/10/2021 Chief Complaint Document Details Patient Name: Date of Service: [name], [name] 10/10/2021 10:00 A M Medical Record Number: [id number] Patient Account Number: [id number] Date of Birth/Sex: Treating RN: 02/13/1933 (88 y.o. [name] Primary Care Provider: Dettinger, Vonna Kotyk Other Clinician: Referring Provider: Treating Provider/Extender: Fredirick Maudlin Dettinger, [name] in Treatment: 7 Information Obtained from: Patient Chief Complaint Patient is at the clinic for treatment of an open ulcer on her left medial first metatarsal head, secondary to friction and pressure. Electronic Signature(s) Signed: 10/10/2021 10:52:27 AM By: Fredirick Maudlin MD FACS Entered By: Fredirick Maudlin on 10/10/2021 10:52:27 -------------------------------------------------------------------------------- Debridement Details Patient Name: Date of Service: [name], [name] 10/10/2021 10:00 A M Medical Record Number: [id number] Patient Account Number: [id number] Date of Birth/Sex: Treating RN: 03/09/1933 (88 y.o. [name] Primary Care Provider: Dettinger, Vonna Kotyk Other Clinician: Referring Provider: Treating Provider/Extender: Fredirick Maudlin Dettinger, [name] in Treatment: 7 Debridement Performed for Assessment: Wound #1 Left,Medial Foot Performed By: Physician Fredirick Maudlin, MD Debridement Type: Debridement Level of Consciousness (Pre-procedure): Awake and Alert Pre-procedure Verification/Time Out Yes - 10:26 Taken: Start Time: 10:26 Pain Control: Lidocaine 4% T opical Solution T Area Debrided (L x W): otal 1.7 (cm) x 1.6 (cm) = 2.72 (cm) Tissue and other material debrided: Viable, Non-Viable, Slough, Subcutaneous, Slough Level: Skin/Subcutaneous Tissue Debridement Description: Excisional Instrument: Curette Bleeding: Minimum Hemostasis Achieved: Pressure Procedural Pain: 5 Post Procedural Pain:  0 Response to Treatment: Procedure was tolerated well Level of Consciousness (Post- Awake and Alert procedure): Post Debridement Measurements of Total Wound Length: (cm) 1.7 Stage: Category/Stage III Width: (cm) 1.6 Depth: (cm) 0.1 Volume: (cm) 0.214 Character of Wound/Ulcer Post Debridement: Improved Post Procedure Diagnosis Same as Pre-procedure Electronic Signature(s) Signed: 10/10/2021 12:40:23 PM By: Fredirick Maudlin MD FACS Signed: 10/10/2021 5:07:36 PM By: Adline Peals Entered By: Adline Peals on 10/10/2021 10:29:47 -------------------------------------------------------------------------------- HPI Details Patient Name: Date of Service: [name], [name] 10/10/2021 10:00 A M Medical Record Number: [id number] Patient Account Number: [id number] Date of Birth/Sex: Treating RN: 1933-02-24 (88 y.o. [name] Primary Care Provider: Dettinger, Vonna Kotyk Other Clinician: Referring Provider: Treating Provider/Extender: Fredirick Maudlin Dettinger, [name] in Treatment: 7 History of Present Illness HPI Description: ADMISSION 08/20/2021 This is an 88 year old Spanish-speaking patient who is being evaluated for an ulcer on the medial aspect of the left first metatarsal head. She says that in February, her shoes were rubbing against a bunion in this location and she first developed a blister that ultimately broke down to an ulcer. She is not a diabetic and she does not smoke. She does have some high blood pressure as well as rheumatoid arthritis. ABI in clinic was noncompressible. When the wound first appeared, she was seen in urgent care and was started on Bactrim. Her PCP followed up and recommended daily Xeroform gauze dressing changes. Initially, it sounds like the wound was quite painful but it is now quite improved. Apparently she had been changing the dressing twice a day at a minimum, and per her PCP's note she may be scrubbing it quite aggressively up to 4  times a day. Today, the wound is clean, but there is perimeter of undermining. I am not entirely sure that I am probing bone but we are certainly very close to it. The surface is a little bit pale but otherwise healthy. No odor or concern for infection. 08/27/2021: The wound is about the same today. There is a little bit more undermining  underneath the callus that is formed around the perimeter. The wound surface is pink and moist. No odor or significant drainage. 09/04/2021: The wound is the same size today. It is very clean and the degree of undermining is about the same. The surface is a little bit hypertrophic. We have been using silver collagen and the patient's son reports that they have been making a concerted effort to tuck the dressing into the undermined area of tissue. No concern for infection. 09/11/2021: The wound is really unchanged with perhaps a little bit less undermining. It is clean and they have been using silver collagen, taking care to tuck it into the undermined area. I thought we had applied for snap VAC approval last week, but for some reason we are unable to find this. She does say that the degree of pain she is experiencing is less and that it sometimes just throbs at night but is better than previously. 09/18/2021: Last week, due to lack of progression with the wound, I took a culture that grew out skin flora but I also got an x-ray that was suggestive of osteomyelitis. She continues to complain of pain and throbbing at night but nothing throughout the day. She has been approved for snap VAC use with a $70- $100 co-pay with each application. The wound itself really looks no different today. 09/25/2021: I initiated doxycycline last week and she has been taking this. We also applied the snap VAC. The wound looks markedly better today with significant closure of the undermined portion. She continues to have some throbbing pain at night, but otherwise is doing well. We ordered an MRI  to get a more definitive answer regarding the possibility of osteomyelitis, but they have not yet received an appointment for this. 10/02/2021: She had her MRI just prior to coming to clinic so I do not yet have any results. She continues to have throbbing pain at night. The wound itself is a bit smaller. There is some periwound moisture with a little bit of slough but the undermined area is almost closed and I am unable to probe to bone. 10/10/2021: Unfortunately, the MRI done last week showed evidence of osteomyelitis. I renewed her doxycycline prescription for an additional 4 weeks. She has an appointment with infectious disease next week. Her primary care provider prescribed diclofenac which seems to be helping with her throbbing nighttime pain. The wound is about the same size today but there is no undermining present whatsoever. The surface is healthy with just a small amount of slough on the most proximal aspect of the wound. Electronic Signature(s) Signed: 10/10/2021 10:54:06 AM By: Fredirick Maudlin MD FACS Entered By: Fredirick Maudlin on 10/10/2021 10:54:06 -------------------------------------------------------------------------------- Physical Exam Details Patient Name: Date of Service: [name], [name] 10/10/2021 10:00 A M Medical Record Number: [id number] Patient Account Number: [id number] Date of Birth/Sex: Treating RN: 1932/06/11 (88 y.o. [name] Primary Care Provider: Dettinger, Vonna Kotyk Other Clinician: Referring Provider: Treating Provider/Extender: Fredirick Maudlin Dettinger, [name] in Treatment: 7 Constitutional . . . . No acute distress.Marland Kitchen Respiratory Normal work of breathing on room air.. Notes 10/10/2021: The wound is about the same size today but there is no undermining present whatsoever. The surface is healthy with just a small amount of slough on the most proximal aspect of the wound. Electronic Signature(s) Signed: 10/10/2021 11:00:15 AM By:  Fredirick Maudlin MD FACS Entered By: Fredirick Maudlin on 10/10/2021 11:00:15 -------------------------------------------------------------------------------- Physician Orders Details Patient Name: Date of Service: [name],  [name] 10/10/2021 10:00 A M Medical Record Number: [id number] Patient Account Number: [id number] Date of Birth/Sex: Treating RN: [birth year] (88 y.o. [name] Primary Care Provider: Dettinger, Vonna Kotyk Other Clinician: Referring Provider: Treating Provider/Extender: Fredirick Maudlin Dettinger, [name] in Treatment: 7 Verbal / Phone Orders: No Diagnosis Coding ICD-10 Coding Code Description [phone] Non-pressure chronic ulcer of other part of left foot with bone involvement without evidence of necrosis Follow-up Appointments ppointment in 1 week. - Dr. Celine Ahr Room 2 Return A Bathing/ Shower/ Hygiene May shower with protection but do not get wound dressing(s) wet. Negative Presssure Wound Therapy Wound #1 Left,Medial Foot SNAP Vac to wound continuously at 190m/hg pressure Off-Loading Wound #1 Left,Medial Foot Open toe surgical shoe to: - L foot Wound Treatment Wound #1 - Foot Wound Laterality: Left, Medial Cleanser: Soap and Water 1 x Per Day/15 Days Discharge Instructions: May shower and wash wound with dial antibacterial soap and water prior to dressing change. Cleanser: Wound Cleanser 1 x Per Day/15 Days Discharge Instructions: Cleanse the wound with wound cleanser prior to applying a clean dressing using gauze sponges, not tissue or cotton balls. Prim Dressing: Promogran Prisma Matrix, 4.34 (sq in) (silver collagen) 1 x Per Day/15 Days ary Discharge Instructions: Moisten collagen with saline or hydrogel Prim Dressing: snap vac 1 x Per DXYI/01Days ary Discharge Instructions: SNAP vac as directed. Patient Medications llergies: penicillin G, lisinopril A Notifications Medication Indication Start End prior to debridement  10/10/2021 lidocaine DOSE topical 4 % cream - cream topical Electronic Signature(s) Signed: 10/10/2021 12:40:23 PM By: CFredirick MaudlinMD FACS Entered By: CFredirick Maudlinon 10/10/2021 11:23:35 -------------------------------------------------------------------------------- Problem List Details Patient Name: Date of Service: [name], Tyreonna 10/10/2021 10:00 A M Medical Record Number: [id number] Account Number: [id number] of Birth/Sex: Treating RN: [birth year](88y.o. FHarlow OhmsPrimary Care Provider: Dettinger, JVonna KotykOther Clinician: Referring Provider: Treating Provider/Extender: CFredirick MaudlinDettinger, JJory Simsin Treatment: 7 Active Problems ICD-10 Encounter Code Description Active Date MDM Diagnosis L97.526 Non-pressure chronic ulcer of other part of left foot with bone involvement 08/20/2021 No Yes without evidence of necrosis Inactive Problems Resolved Problems Electronic Signature(s) Signed: 10/10/2021 10:49:13 AM By: CFredirick MaudlinMD FACS Entered By: CFredirick Maudlinon 10/10/2021 10:49:13 -------------------------------------------------------------------------------- Progress Note Details Patient Name: Date of Service: [name], [name] 10/10/2021 10:00 A M Medical Record Number: [id number] Account Number: [id number] of Birth/Sex: Treating RN: [birth year](88y.o. FHarlow OhmsPrimary Care Provider: Dettinger, JVonna KotykOther Clinician: Referring Provider: Treating Provider/Extender: CFredirick MaudlinDettinger, JJory Simsin Treatment: 7 Subjective Chief Complaint Information obtained from Patient Patient is at the clinic for treatment of an open ulcer on her left medial first metatarsal head, secondary to friction and pressure. History of Present Illness (HPI) ADMISSION 08/20/2021 This is an 88year old Spanish-speaking patient who is being evaluated for an ulcer on the medial aspect of the left first  metatarsal head. She says that in February, her shoes were rubbing against a bunion in this location and she first developed a blister that ultimately broke down to an ulcer. She is not a diabetic and she does not smoke. She does have some high blood pressure as well as rheumatoid arthritis. ABI in clinic was noncompressible. When the wound first appeared, she was seen in urgent care and was started on Bactrim. Her PCP followed up and recommended daily Xeroform gauze dressing changes. Initially, it sounds like the wound was quite painful but it is now quite improved. Apparently  she had been changing the dressing twice a day at a minimum, and per her PCP's note she may be scrubbing it quite aggressively up to 4 times a day. Today, the wound is clean, but there is perimeter of undermining. I am not entirely sure that I am probing bone but we are certainly very close to it. The surface is a little bit pale but otherwise healthy. No odor or concern for infection. 08/27/2021: The wound is about the same today. There is a little bit more undermining underneath the callus that is formed around the perimeter. The wound surface is pink and moist. No odor or significant drainage. 09/04/2021: The wound is the same size today. It is very clean and the degree of undermining is about the same. The surface is a little bit hypertrophic. We have been using silver collagen and the patient's son reports that they have been making a concerted effort to tuck the dressing into the undermined area of tissue. No concern for infection. 09/11/2021: The wound is really unchanged with perhaps a little bit less undermining. It is clean and they have been using silver collagen, taking care to tuck it into the undermined area. I thought we had applied for snap VAC approval last week, but for some reason we are unable to find this. She does say that the degree of pain she is experiencing is less and that it sometimes just throbs at  night but is better than previously. 09/18/2021: Last week, due to lack of progression with the wound, I took a culture that grew out skin flora but I also got an x-ray that was suggestive of osteomyelitis. She continues to complain of pain and throbbing at night but nothing throughout the day. She has been approved for snap VAC use with a $70- $100 co-pay with each application. The wound itself really looks no different today. 09/25/2021: I initiated doxycycline last week and she has been taking this. We also applied the snap VAC. The wound looks markedly better today with significant closure of the undermined portion. She continues to have some throbbing pain at night, but otherwise is doing well. We ordered an MRI to get a more definitive answer regarding the possibility of osteomyelitis, but they have not yet received an appointment for this. 10/02/2021: She had her MRI just prior to coming to clinic so I do not yet have any results. She continues to have throbbing pain at night. The wound itself is a bit smaller. There is some periwound moisture with a little bit of slough but the undermined area is almost closed and I am unable to probe to bone. 10/10/2021: Unfortunately, the MRI done last week showed evidence of osteomyelitis. I renewed her doxycycline prescription for an additional 4 weeks. She has an appointment with infectious disease next week. Her primary care provider prescribed diclofenac which seems to be helping with her throbbing nighttime pain. The wound is about the same size today but there is no undermining present whatsoever. The surface is healthy with just a small amount of slough on the most proximal aspect of the wound. Patient History Information obtained from Patient. Family History Cancer - Siblings, No family history of Diabetes, Heart Disease, Hereditary Spherocytosis, Hypertension, Kidney Disease, Lung Disease, Seizures, Stroke, Thyroid Problems, Tuberculosis. Social  History Never smoker, Marital Status - Married, Alcohol Use - Never, Drug Use - No History, Caffeine Use - Daily. Medical History Eyes Patient has history of Cataracts Cardiovascular Patient has history of Hypertension Musculoskeletal Patient  has history of Rheumatoid Arthritis - hands and feet Hospitalization/Surgery History - cataract surgery. Medical A Surgical History Notes nd Eyes macular degeneration Ear/Nose/Mouth/Throat GERD, dysarthria Hematologic/Lymphatic dyslipidemia Cardiovascular palpitations Musculoskeletal osteoporosis, unsteady gait Neurologic TIA Objective Constitutional No acute distress.. Vitals Time Taken: 10:09 AM, Height: 60 in, Weight: 119 lbs, BMI: 23.2, Temperature: 97.9 F, Pulse: 71 bpm, Respiratory Rate: 18 breaths/min, Blood Pressure: 165/76 mmHg. Respiratory Normal work of breathing on room air.. General Notes: 10/10/2021: The wound is about the same size today but there is no undermining present whatsoever. The surface is healthy with just a small amount of slough on the most proximal aspect of the wound. Integumentary (Hair, Skin) Wound #1 status is Open. Original cause of wound was Gradually Appeared. The date acquired was: 05/22/2021. The wound has been in treatment 7 weeks. The wound is located on the Left,Medial Foot. The wound measures 1.7cm length x 1.6cm width x 0.1cm depth; 2.136cm^2 area and 0.214cm^3 volume. There is Fat Layer (Subcutaneous Tissue) exposed. There is no tunneling or undermining noted. There is a medium amount of serosanguineous drainage noted. The wound margin is well defined and not attached to the wound base. There is large (67-100%) red granulation within the wound bed. There is a small (1-33%) amount of necrotic tissue within the wound bed including Adherent Slough. Assessment Active Problems ICD-10 Non-pressure chronic ulcer of other part of left foot with bone involvement without evidence of  necrosis Procedures Wound #1 Pre-procedure diagnosis of Wound #1 is a Pressure Ulcer located on the Left,Medial Foot . There was a Excisional Skin/Subcutaneous Tissue Debridement with a total area of 2.72 sq cm performed by Fredirick Maudlin, MD. With the following instrument(s): Curette to remove Viable and Non-Viable tissue/material. Material removed includes Subcutaneous Tissue and Slough and after achieving pain control using Lidocaine 4% T opical Solution. No specimens were taken. A time out was conducted at 10:26, prior to the start of the procedure. A Minimum amount of bleeding was controlled with Pressure. The procedure was tolerated well with a pain level of 5 throughout and a pain level of 0 following the procedure. Post Debridement Measurements: 1.7cm length x 1.6cm width x 0.1cm depth; 0.214cm^3 volume. Post debridement Stage noted as Category/Stage III. Character of Wound/Ulcer Post Debridement is improved. Post procedure Diagnosis Wound #1: Same as Pre-Procedure Plan Follow-up Appointments: Return Appointment in 1 week. - Dr. Celine Ahr Room 2 Bathing/ Shower/ Hygiene: May shower with protection but do not get wound dressing(s) wet. Negative Presssure Wound Therapy: Wound #1 Left,Medial Foot: SNAP Vac to wound continuously at 129m/hg pressure Off-Loading: Wound #1 Left,Medial Foot: Open toe surgical shoe to: - L foot The following medication(s) was prescribed: lidocaine topical 4 % cream cream topical for prior to debridement was prescribed at facility WOUND #1: - Foot Wound Laterality: Left, Medial Cleanser: Soap and Water 1 x Per Day/15 Days Discharge Instructions: May shower and wash wound with dial antibacterial soap and water prior to dressing change. Cleanser: Wound Cleanser 1 x Per Day/15 Days Discharge Instructions: Cleanse the wound with wound cleanser prior to applying a clean dressing using gauze sponges, not tissue or cotton balls. Prim Dressing: Promogran Prisma  Matrix, 4.34 (sq in) (silver collagen) 1 x Per Day/15 Days ary Discharge Instructions: Moisten collagen with saline or hydrogel Prim Dressing: snap vac 1 x Per DXBJ/47Days ary Discharge Instructions: SNAP vac as directed. 10/10/2021: The wound is about the same size today but there is no undermining present whatsoever. The surface is healthy with  just a small amount of slough on the most proximal aspect of the wound. I am going to add a piece of silver collagen underneath her snap VAC to try and encourage ingrowth of epithelium. In discussing with her son and the patient, it sounds like she has a pretty poor appetite and is not getting adequate protein in her diet. She finds the premade protein shakes to be too sweet. I suggested that she try Pro-Stat powder that she can add to a beverage of her choice and aim for a goal of 90 to 100 mg of protein daily. She will continue taking her oral antibiotics. Follow-up with me in 1 week. Electronic Signature(s) Signed: 10/10/2021 11:24:59 AM By: Fredirick Maudlin MD FACS Entered By: Fredirick Maudlin on 10/10/2021 11:24:59 -------------------------------------------------------------------------------- HxROS Details Patient Name: Date of Service: [name], [name] 10/10/2021 10:00 A M Medical Record Number: [id number] Patient Account Number: [id number] Date of Birth/Sex: Treating RN: 08/02/1932 (88 y.o. [name] Primary Care Provider: Dettinger, Vonna Kotyk Other Clinician: Referring Provider: Treating Provider/Extender: Fredirick Maudlin Dettinger, [name] in Treatment: 7 Information Obtained From Patient Eyes Medical History: Positive for: Cataracts Past Medical History Notes: macular degeneration Ear/Nose/Mouth/Throat Medical History: Past Medical History Notes: GERD, dysarthria Hematologic/Lymphatic Medical History: Past Medical History Notes: dyslipidemia Cardiovascular Medical History: Positive for:  Hypertension Past Medical History Notes: palpitations Musculoskeletal Medical History: Positive for: Rheumatoid Arthritis - hands and feet Past Medical History Notes: osteoporosis, unsteady gait Neurologic Medical History: Past Medical History Notes: TIA HBO Extended History Items Eyes: Cataracts Immunizations Pneumococcal Vaccine: Received Pneumococcal Vaccination: Yes Received Pneumococcal Vaccination On or After 60th Birthday: Yes Implantable Devices None Hospitalization / Surgery History Type of Hospitalization/Surgery cataract surgery Family and Social History Cancer: Yes - Siblings; Diabetes: No; Heart Disease: No; Hereditary Spherocytosis: No; Hypertension: No; Kidney Disease: No; Lung Disease: No; Seizures: No; Stroke: No; Thyroid Problems: No; Tuberculosis: No; Never smoker; Marital Status - Married; Alcohol Use: Never; Drug Use: No History; Caffeine Use: Daily; Financial Concerns: No; Food, Clothing or Shelter Needs: No; Support System Lacking: No; Transportation Concerns: No Electronic Signature(s) Signed: 10/10/2021 12:40:23 PM By: Fredirick Maudlin MD FACS Signed: 10/10/2021 5:07:36 PM By: Sabas Sous By: Fredirick Maudlin on 10/10/2021 10:59:37 -------------------------------------------------------------------------------- SuperBill Details Patient Name: Date of Service: [name], [name] 10/10/2021 Medical Record Number: [id number] Patient Account Number: [id number] Date of Birth/Sex: Treating RN: Aug 19, 1932 (88 y.o. [name] Primary Care Provider: Dettinger, Vonna Kotyk Other Clinician: Referring Provider: Treating Provider/Extender: Fredirick Maudlin Dettinger, [name] in Treatment: 7 Diagnosis Coding ICD-10 Codes Code Description [id number] Non-pressure chronic ulcer of other part of left foot with bone involvement without evidence of necrosis Facility Procedures CPT4 Code: 99357017 Description: 79390 - DEB SUBQ TISSUE 20 SQ  CM/< ICD-10 Diagnosis Description L97.526 Non-pressure chronic ulcer of other part of left foot with bone involvement with Modifier: out evidence of necr Quantity: 1 osis CPT4 Code: [id number] Description: 07622 - WOUND VAC-50 SQ CM OR LESS Modifier: Quantity: 1 Physician Procedures : CPT4 Code Description Modifier [id number] - WC PHYS LEVEL 3 - EST PT 25 ICD-10 Diagnosis Description L97.526 Non-pressure chronic ulcer of other part of left foot with bone involvement without evidence of necr Quantity: 1 osis : [id number] - WC PHYS SUBQ TISS 20 SQ CM ICD-10 Diagnosis Description L97.526 Non-pressure chronic ulcer of other part of left foot with bone involvement without evidence of necr Quantity: 1 osis Electronic Signature(s) Signed: 10/10/2021 11:26:04 AM By: Fredirick Maudlin MD FACS Entered By:  Fredirick Maudlin on 10/10/2021 11:26:04

## 2021-10-10 NOTE — Progress Notes (Signed)
TARINI, CARRIER ([id number]) Visit Report for 10/10/2021 Arrival Information Details Patient Name: Date of Service: [name], [name] 10/10/2021 10:00 A M Medical Record Number: [id number] Patient Account Number: [id number] Date of Birth/Sex: Treating RN: 05/20/1932 (88 y.o. [name], [name] Primary Care Mihailo Sage: Dettinger, Vonna Kotyk Other Clinician: Referring Rue Tinnel: Treating Qaadir Kent/Extender: Fredirick Maudlin Dettinger, [name] in Treatment: 7 Visit Information History Since Last Visit Added or deleted any medications: No Patient Arrived: [name] Any new allergies or adverse reactions: No Arrival Time: 10:04 Had a fall or experienced change in No Accompanied By: son and translator activities of daily living that may affect Transfer Assistance: None risk of falls: Patient Identification Verified: Yes Signs or symptoms of abuse/neglect since last visito No Secondary Verification Process Completed: Yes Hospitalized since last visit: No Patient Requires Transmission-Based Precautions: No Implantable device outside of the clinic excluding No cellular tissue based products placed in the center since last visit: Has Dressing in Place as Prescribed: Yes Pain Present Now: Yes Electronic Signature(s) Signed: 10/10/2021 5:07:36 PM By: Adline Peals Entered By: Adline Peals on 10/10/2021 10:09:04 -------------------------------------------------------------------------------- Encounter Discharge Information Details Patient Name: Date of Service: [name], [name] 10/10/2021 10:00 A M Medical Record Number: [id number] Patient Account Number: [id number] Date of Birth/Sex: Treating RN: 1932-06-23 (88 y.o. [name] Primary Care Stepfon Rawles: Dettinger, Vonna Kotyk Other Clinician: Referring Mykeria Garman: Treating Chenee Munns/Extender: Fredirick Maudlin Dettinger, [name] in Treatment: 7 Encounter Discharge Information Items Post Procedure Vitals Discharge Condition:  Stable Temperature (F): 97.9 Ambulatory Status: Walker Pulse (bpm): 71 Discharge Destination: Home Respiratory Rate (breaths/min): 18 Transportation: Private Auto Blood Pressure (mmHg): 165/76 Accompanied By: son and translator Schedule Follow-up Appointment: Yes Clinical Summary of Care: Patient Declined Electronic Signature(s) Signed: 10/10/2021 5:07:36 PM By: Adline Peals Entered By: Adline Peals on 10/10/2021 10:32:44 -------------------------------------------------------------------------------- Lower Extremity Assessment Details Patient Name: Date of Service: [name], [name] 10/10/2021 10:00 A M Medical Record Number: [id number] Patient Account Number: [id number] Date of Birth/Sex: Treating RN: 04/30/1932 (88 y.o. [name] Primary Care Daionna Crossland: Dettinger, Vonna Kotyk Other Clinician: Referring Fredrik Mogel: Treating Nawaf Strange/Extender: Fredirick Maudlin Dettinger, [name] in Treatment: 7 Edema Assessment Assessed: Shirlyn Goltz: No] Patrice Paradise: No] E[Left: dema] [Right: :] Calf Left: Right: Point of Measurement: 33 cm From Medial Instep 29.6 cm Ankle Left: Right: Point of Measurement: 9 cm From Medial Instep 22.8 cm Vascular Assessment Pulses: Dorsalis Pedis Palpable: [Left:Yes] Electronic Signature(s) Signed: 10/10/2021 5:07:36 PM By: Adline Peals Entered By: Adline Peals on 10/10/2021 10:11:47 -------------------------------------------------------------------------------- Multi Wound Chart Details Patient Name: Date of Service: [name], [name] 10/10/2021 10:00 A M Medical Record Number: [id number] Patient Account Number: [id number] Date of Birth/Sex: Treating RN: 1932-10-28 (88 y.o. [name] Primary Care Million Maharaj: Dettinger, Vonna Kotyk Other Clinician: Referring Graziella Connery: Treating Tiffiney Sparrow/Extender: Fredirick Maudlin Dettinger, [name] in Treatment: 7 Vital Signs Height(in): 60 Pulse(bpm): 58 Weight(lbs):  119 Blood Pressure(mmHg): 165/76 Body Mass Index(BMI): 23.2 Temperature(F): 97.9 Respiratory Rate(breaths/min): 18 Photos: [1:Left, Medial Foot] [N/A:N/A N/A] Wound Location: [1:Gradually Appeared] [N/A:N/A] Wounding Event: [1:Pressure Ulcer] [N/A:N/A] Primary Etiology: [1:Cataracts, Hypertension, Rheumatoid N/A] Comorbid History: [1:Arthritis 05/22/2021] [N/A:N/A] Date Acquired: [1:7] [N/A:N/A] Weeks of Treatment: [1:Open] [N/A:N/A] Wound Status: [1:No] [N/A:N/A] Wound Recurrence: [1:1.7x1.6x0.1] [N/A:N/A] Measurements L x W x D (cm) [1:2.136] [N/A:N/A] A (cm) : rea [1:0.214] [N/A:N/A] Volume (cm) : [1:38.20%] [N/A:N/A] % Reduction in A [1:rea: 38.20%] [N/A:N/A] % Reduction in Volume: [1:Category/Stage III] [N/A:N/A] Classification: [1:Medium] [N/A:N/A] Exudate A mount: [1:Serosanguineous] [N/A:N/A] Exudate Type: [1:red, brown] [N/A:N/A] Exudate Color: [1:Well defined, not  attached] [N/A:N/A] Wound Margin: [1:Large (67-100%)] [N/A:N/A] Granulation A mount: [1:Red] [N/A:N/A] Granulation Quality: [1:Small (1-33%)] [N/A:N/A] Necrotic A mount: [1:Fat Layer (Subcutaneous Tissue): Yes N/A] Exposed Structures: [1:Fascia: No Tendon: No Muscle: No Joint: No Bone: No Small (1-33%)] [N/A:N/A] Epithelialization: [1:Debridement - Excisional] [N/A:N/A] Debridement: Pre-procedure Verification/Time Out 10:26 [N/A:N/A] Taken: [1:Lidocaine 4% Topical Solution] [N/A:N/A] Pain Control: [1:Subcutaneous, Slough] [N/A:N/A] Tissue Debrided: [1:Skin/Subcutaneous Tissue] [N/A:N/A] Level: [1:2.72] [N/A:N/A] Debridement A (sq cm): [1:rea Curette] [N/A:N/A] Instrument: [1:Minimum] [N/A:N/A] Bleeding: [1:Pressure] [N/A:N/A] Hemostasis A chieved: [1:5] [N/A:N/A] Procedural Pain: [1:0] [N/A:N/A] Post Procedural Pain: [1:Procedure was tolerated well] [N/A:N/A] Debridement Treatment Response: [1:1.7x1.6x0.1] [N/A:N/A] Post Debridement Measurements L x W x D (cm) [1:0.214] [N/A:N/A] Post Debridement  Volume: (cm) [1:Category/Stage III] [N/A:N/A] Post Debridement Stage: [1:Debridement] [N/A:N/A] Procedures Performed: [1:Negative Pressure Wound Therapy Maintenance (NPWT)] Treatment Notes Wound #1 (Foot) Wound Laterality: Left, Medial Cleanser Soap and Water Discharge Instruction: May shower and wash wound with dial antibacterial soap and water prior to dressing change. Wound Cleanser Discharge Instruction: Cleanse the wound with wound cleanser prior to applying a clean dressing using gauze sponges, not tissue or cotton balls. Peri-Wound Care Topical Primary Dressing Promogran Prisma Matrix, 4.34 (sq in) (silver collagen) Discharge Instruction: Moisten collagen with saline or hydrogel snap vac Discharge Instruction: SNAP vac as directed. Secondary Dressing Secured With Compression Wrap Compression Stockings Add-Ons Electronic Signature(s) Signed: 10/10/2021 10:52:20 AM By: Fredirick Maudlin MD FACS Signed: 10/10/2021 5:07:36 PM By: Sabas Sous By: Fredirick Maudlin on 10/10/2021 10:52:20 -------------------------------------------------------------------------------- Multi-Disciplinary Care Plan Details Patient Name: Date of Service: [name], [name] 10/10/2021 10:00 A M Medical Record Number: [id number] Patient Account Number: [id number] Date of Birth/Sex: Treating RN: April 19, 1933 (88 y.o. [name] Primary Care Haile Bosler: Dettinger, Vonna Kotyk Other Clinician: Referring Bernisha Verma: Treating Syrenity Klepacki/Extender: Fredirick Maudlin Dettinger, [name] in Treatment: 7 Active Inactive Abuse / Safety / Falls / Self Care Management Nursing Diagnoses: Impaired physical mobility Potential for falls Potential for injury related to falls Goals: Patient will remain injury free related to falls Date Initiated: 08/20/2021 Target Resolution Date: 11/15/2021 Goal Status: Active Patient/caregiver will demonstrate safe use of adaptive devices to increase  mobility Date Initiated: 08/20/2021 Date Inactivated: 09/11/2021 Target Resolution Date: 10/11/2021 Goal Status: Met Patient/caregiver will verbalize understanding of skin care regimen Date Initiated: 08/20/2021 Date Inactivated: 09/11/2021 Target Resolution Date: 10/11/2021 Goal Status: Met Interventions: Assess fall risk on admission and as needed Assess impairment of mobility on admission and as needed per policy Provide education on fall prevention Notes: Wound/Skin Impairment Nursing Diagnoses: Impaired tissue integrity Knowledge deficit related to ulceration/compromised skin integrity Goals: Patient/caregiver will verbalize understanding of skin care regimen Date Initiated: 08/20/2021 Date Inactivated: 09/11/2021 Target Resolution Date: 09/10/2021 Goal Status: Met Ulcer/skin breakdown will have a volume reduction of 30% by week 4 Date Initiated: 08/20/2021 Target Resolution Date: 11/15/2021 Goal Status: Active Interventions: Assess patient/caregiver ability to perform ulcer/skin care regimen upon admission and as needed Assess ulceration(s) every visit Treatment Activities: Referred to DME Angelgabriel Willmore for dressing supplies : 08/20/2021 Skin care regimen initiated : 08/20/2021 Topical wound management initiated : 08/20/2021 Notes: Electronic Signature(s) Signed: 10/10/2021 5:07:36 PM By: Adline Peals Entered By: Adline Peals on 10/10/2021 10:15:51 -------------------------------------------------------------------------------- Negative Pressure Wound Therapy Maintenance (NPWT) Details Patient Name: Date of Service: [name], [name] 10/10/2021 10:00 A M Medical Record Number: [id number] Patient Account Number: [id number] Date of Birth/Sex: Treating RN: Jan 17, 1933 (88 y.o. [name] Primary Care Jayron Maqueda: Dettinger, Vonna Kotyk Other Clinician: Referring Phoenyx Paulsen: Treating Aerionna Moravek/Extender: Fredirick Maudlin Dettinger, Vonna Kotyk  Weeks in Treatment: 7 NPWT Maintenance  Performed for: Wound #1 Left, Medial Foot Performed By: Adline Peals, RN Type: VAC System Coverage Size (sq cm): 2.72 Pressure Type: Constant Pressure Setting: 125 mmHG Drain Type: None Sponge/Dressing Type: Foam, Blue Date Initiated: 09/18/2021 Dressing Removed: No Quantity of Sponges/Gauze Removed: 1 Canister Changed: Yes Dressing Reapplied: Yes Quantity of Sponges/Gauze Inserted: 1 Days On NPWT : 23 Post Procedure Diagnosis Same as Pre-procedure Electronic Signature(s) Signed: 10/10/2021 5:07:36 PM By: Adline Peals Entered By: Adline Peals on 10/10/2021 10:33:50 -------------------------------------------------------------------------------- Pain Assessment Details Patient Name: Date of Service: [name], Safiyyah 10/10/2021 10:00 A M Medical Record Number: [id number] Patient Account Number: [id number] Date of Birth/Sex: Treating RN: 08-13-1932 (88 y.o. [name] Primary Care Traxton Kolenda: Dettinger, Vonna Kotyk Other Clinician: Referring Immanuel Fedak: Treating Asta Corbridge/Extender: Fredirick Maudlin Dettinger, [name] in Treatment: 7 Active Problems Location of Pain Severity and Description of Pain Patient Has Paino Yes Site Locations Pain Location: Pain Location: Pain in Ulcers Duration of the Pain. Constant / Intermittento Intermittent Rate the pain. Current Pain Level: 3 Character of Pain Describe the Pain: Aching Pain Management and Medication Current Pain Management: Medication: Yes How does your wound impact your activities of daily livingo Sleep: Yes Electronic Signature(s) Signed: 10/10/2021 5:07:36 PM By: Adline Peals Entered By: Adline Peals on 10/10/2021 10:10:34 -------------------------------------------------------------------------------- Patient/Caregiver Education Details Patient Name: Date of Service: [name], [name] 6/22/2023andnbsp10:00 A M Medical Record Number: [id number] Patient Account Number:  [id number] Date of Birth/Gender: Treating RN: 10/06/1932 (88 y.o. [name] Primary Care Physician: Dettinger, Vonna Kotyk Other Clinician: Referring Physician: Treating Physician/Extender: Fredirick Maudlin Dettinger, [name] in Treatment: 7 Education Assessment Education Provided To: Patient Education Topics Provided Wound/Skin Impairment: Methods: Explain/Verbal Responses: Reinforcements needed, State content correctly Electronic Signature(s) Signed: 10/10/2021 5:07:36 PM By: Adline Peals Entered By: Adline Peals on 10/10/2021 10:16:02 -------------------------------------------------------------------------------- Wound Assessment Details Patient Name: Date of Service: [name], [name] 10/10/2021 10:00 A M Medical Record Number: [id number] Patient Account Number: [id number] Date of Birth/Sex: Treating RN: 08/08/1932 (88 y.o. [name] Primary Care Merril Nagy: Dettinger, Vonna Kotyk Other Clinician: Referring Zaivion Kundrat: Treating Naoki Migliaccio/Extender: Fredirick Maudlin Dettinger, [name] in Treatment: 7 Wound Status Wound Number: 1 Primary Etiology: Pressure Ulcer Wound Location: Left, Medial Foot Wound Status: Open Wounding Event: Gradually Appeared Comorbid History: Cataracts, Hypertension, Rheumatoid Arthritis Date Acquired: 05/22/2021 Weeks Of Treatment: 7 Clustered Wound: No Photos Wound Measurements Length: (cm) 1.7 Width: (cm) 1.6 Depth: (cm) 0.1 Area: (cm) 2.136 Volume: (cm) 0.214 % Reduction in Area: 38.2% % Reduction in Volume: 38.2% Epithelialization: Small (1-33%) Tunneling: No Undermining: No Wound Description Classification: Category/Stage III Wound Margin: Well defined, not attached Exudate Amount: Medium Exudate Type: Serosanguineous Exudate Color: red, brown Foul Odor After Cleansing: No Slough/Fibrino Yes Wound Bed Granulation Amount: Large (67-100%) Exposed Structure Granulation Quality: Red Fascia  Exposed: No Necrotic Amount: Small (1-33%) Fat Layer (Subcutaneous Tissue) Exposed: Yes Necrotic Quality: Adherent Slough Tendon Exposed: No Muscle Exposed: No Joint Exposed: No Bone Exposed: No Treatment Notes Wound #1 (Foot) Wound Laterality: Left, Medial Cleanser Soap and Water Discharge Instruction: May shower and wash wound with dial antibacterial soap and water prior to dressing change. Wound Cleanser Discharge Instruction: Cleanse the wound with wound cleanser prior to applying a clean dressing using gauze sponges, not tissue or cotton balls. Peri-Wound Care Topical Primary Dressing Promogran Prisma Matrix, 4.34 (sq in) (silver collagen) Discharge Instruction: Moisten collagen with saline or hydrogel snap vac Discharge Instruction: SNAP vac as directed. Secondary  Dressing Secured With Compression Wrap Compression Stockings Add-Ons Electronic Signature(s) Signed: 10/10/2021 5:07:36 PM By: Adline Peals Entered By: Adline Peals on 10/10/2021 10:15:24 -------------------------------------------------------------------------------- Vitals Details Patient Name: Date of Service: [name], Majestic 10/10/2021 10:00 A M Medical Record Number: [id number] Patient Account Number: [id number] Date of Birth/Sex: Treating RN: [birth year] (88 y.o. [name] Primary Care Makenzye Troutman: Dettinger, Vonna Kotyk Other Clinician: Referring Jhayla Podgorski: Treating Deitra Craine/Extender: Fredirick Maudlin Dettinger, [name] in Treatment: 7 Vital Signs Time Taken: 10:09 Temperature (F): 97.9 Height (in): 60 Pulse (bpm): 71 Weight (lbs): 119 Respiratory Rate (breaths/min): 18 Body Mass Index (BMI): 23.2 Blood Pressure (mmHg): 165/76 Reference Range: 80 - 120 mg / dl Electronic Signature(s) Signed: 10/10/2021 5:07:36 PM By: Adline Peals Entered By: Adline Peals on 10/10/2021 10:09:33

## 2021-10-16 ENCOUNTER — Encounter (HOSPITAL_BASED_OUTPATIENT_CLINIC_OR_DEPARTMENT_OTHER): Payer: Medicare Other | Admitting: General Surgery

## 2021-10-16 ENCOUNTER — Ambulatory Visit (INDEPENDENT_AMBULATORY_CARE_PROVIDER_SITE_OTHER): Payer: Medicare Other | Admitting: Infectious Diseases

## 2021-10-16 ENCOUNTER — Encounter: Payer: Self-pay | Admitting: Infectious Diseases

## 2021-10-16 ENCOUNTER — Telehealth: Payer: Self-pay

## 2021-10-16 ENCOUNTER — Other Ambulatory Visit: Payer: Self-pay

## 2021-10-16 VITALS — BP 131/80 | HR 56 | Temp 98.1°F | Ht <= 58 in | Wt 127.0 lb

## 2021-10-16 DIAGNOSIS — Z880 Allergy status to penicillin: Secondary | ICD-10-CM | POA: Diagnosis not present

## 2021-10-16 DIAGNOSIS — L97526 Non-pressure chronic ulcer of other part of left foot with bone involvement without evidence of necrosis: Secondary | ICD-10-CM | POA: Diagnosis not present

## 2021-10-16 DIAGNOSIS — M069 Rheumatoid arthritis, unspecified: Secondary | ICD-10-CM | POA: Diagnosis not present

## 2021-10-16 DIAGNOSIS — M0579 Rheumatoid arthritis with rheumatoid factor of multiple sites without organ or systems involvement: Secondary | ICD-10-CM

## 2021-10-16 DIAGNOSIS — Z5181 Encounter for therapeutic drug level monitoring: Secondary | ICD-10-CM | POA: Insufficient documentation

## 2021-10-16 DIAGNOSIS — M869 Osteomyelitis, unspecified: Secondary | ICD-10-CM | POA: Diagnosis not present

## 2021-10-16 DIAGNOSIS — L89893 Pressure ulcer of other site, stage 3: Secondary | ICD-10-CM | POA: Diagnosis not present

## 2021-10-16 MED ORDER — LEVOFLOXACIN 750 MG PO TABS: 750.0000 mg | ORAL_TABLET | Freq: Every day | ORAL | 0 refills | Status: DC

## 2021-10-16 MED ORDER — LINEZOLID 600 MG PO TABS: 600.0000 mg | ORAL_TABLET | Freq: Two times a day (BID) | ORAL | 0 refills | Status: DC

## 2021-10-16 NOTE — Progress Notes (Incomplete)
Patient Active Problem List   Diagnosis Date Noted   Dyslipidemia 01/24/2021   Osteoporosis 07/25/2020   Rheumatoid arthritis involving multiple sites with positive rheumatoid factor ([place]) 05/10/2018   Rheumatoid arthritis involving both hands with positive rheumatoid factor ([place]) 05/10/2018   Rheumatoid arthritis involving both feet with positive rheumatoid factor ([place]) 05/10/2018   GERD (gastroesophageal reflux disease) 02/05/2018   Early stage nonexudative age-related macular degeneration of both eyes 08/06/2017   Thyroid nodule 02/21/2015   Dysarthria 02/19/2015   HTN (hypertension) 02/19/2015   History of TIA (transient ischemic attack) 02/19/2015   Impaired weight bearing     Patient's Medications  New Prescriptions   No medications on file  Previous Medications   ALENDRONATE (FOSAMAX) 70 MG TABLET    TAKE 1 TABLET BY MOUTH EVERY 7 (SEVEN) DAYS. TAKE WITH A FULL GLASS OF WATER ON AN EMPTY STOMACH.   AMLODIPINE (NORVASC) 5 MG TABLET    Take 1 tablet (5 mg total) by mouth daily.   ATORVASTATIN (LIPITOR) 40 MG TABLET    TAKE 1 TABLET BY MOUTH EVERYDAY AT BEDTIME   BACITRACIN-POLYMYXIN B (POLYSPORIN) OPHTHALMIC OINTMENT    Place 1 application into both eyes 2 (two) times daily. apply to eye every 12 hours while awake   DICLOFENAC (VOLTAREN) 75 MG EC TABLET    Take 1 tablet (75 mg total) by mouth 2 (two) times daily.   LORATADINE (CLARITIN) 10 MG TABLET    Take 1 tablet (10 mg total) by mouth daily.   SULFAMETHOXAZOLE-TRIMETHOPRIM (BACTRIM DS) 800-160 MG TABLET    Take 1 tablet by mouth 2 (two) times daily.  Modified Medications   No medications on file  Discontinued Medications   No medications on file    Subjective: 88 Y O Female with h/o RA, Osteoporosis, HTN, Dyslipidemia who is referred from wound care from evaluation and management of osteomyelitis. Patient is accompanied by her son who is helping with interpretation and they declined interpreter service.   She  started having blister in the medial aspect of left great toe sometime in Feb when her shoes were rubbing against a bunion in this location that ultimately broke down to an ulcer. Seen by UC and was prescribed a course of PO bactrim. Her PCP followed up and recommended daily Xeroform gauze dressing changes. She started following up with wound care early May. 5/31 wound cx was done due to lack of progression of wound which grew out skin flora. 6/7 was started on doxycyline with an Xray 5/24: Soft tissue defect along the medial aspect of the first metatarsophalangeal joint with bony erosions along the medial head of the first metatarsal, concerning for osteomyelitis.  MRI left foot 6/15 Medial forefoot soft tissue ulcer with underlying osteomyelitis of the adjacent first metatarsal head. Adjacent soft tissue swelling without well-defined/drainable abscess on noncontrast MRI.  She was refilled another 4 weeks course of doxycyline on fu appt on 6/22 which she has been currently taking ( 3rd week) after MRI showed osteomyelitis. She has a small vac in her wound which gets changed weekly with minimal serous drainage and seems to be clinically improving per son/patient.   Denies fevers, chills, sweats Denies nausea, vomiting and diarrhea Denies cough, chest pain and SOB.  Denies rashes or GU symptoms  Not on medications for RA. She does not know who diagnosed her with RA Denies h/o DM  H/o penicillin allergy, when she got an injection with penicillin many years ago when she  was pregnant and had hives. No face/lip swelling or SOB.  Denies smoking, alcohol and IVDU  Review of Systems: all systems reviewed with pertinent positives and negatives as listed above  Past Medical History:  Diagnosis Date   Arthritis of right foot    Hx: UTI (urinary tract infection)    Hypertension    Palpitations    Unsteady gait    Past Surgical History:  Procedure Laterality Date   CATARACT EXTRACTION W/PHACO  Right 01/01/2016   Procedure: CATARACT EXTRACTION PHACO AND INTRAOCULAR LENS PLACEMENT RIGHT EYE;  Surgeon: Rutherford Guys, MD;  Location: [facility];  Service: Ophthalmology;  Laterality: Right;  CDE: 9.69   CATARACT EXTRACTION W/PHACO Left 01/29/2016   Procedure: CATARACT EXTRACTION PHACO AND INTRAOCULAR LENS PLACEMENT (IOC);  Surgeon: Rutherford Guys, MD;  Location: [facility];  Service: Ophthalmology;  Laterality: Left;  CDE: 9.32   None      Social History   Tobacco Use   Smoking status: Never   Smokeless tobacco: Never  Vaping Use   Vaping Use: Never used  Substance Use Topics   Alcohol use: No    Alcohol/week: 0.0 standard drinks of alcohol   Drug use: Never    Family History  Problem Relation Age of Onset   Heart attack Mother 5       Died in [place]   Healthy Daughter    Healthy Son    Healthy Son    Healthy Daughter    Allergic rhinitis Neg Hx    Angioedema Neg Hx    Asthma Neg Hx    Eczema Neg Hx    Immunodeficiency Neg Hx    Urticaria Neg Hx     Allergies  Allergen Reactions   Penicillin G Hives   Lisinopril Swelling    Tongue swelling    Health Maintenance  Topic Date Due   URINE MICROALBUMIN  Never done   Zoster Vaccines- Shingrix (1 of 2) Never done   COVID-19 Vaccine (4 - Moderna series) 06/20/2020   Pneumonia Vaccine 64+ Years old (2 - PPSV23 if available, else PCV20) 07/25/2021   INFLUENZA VACCINE  11/19/2021   DEXA SCAN  01/22/2022   TETANUS/TDAP  01/17/2027   HPV VACCINES  Aged Out    Objective: BP 131/80   Pulse (!) 56   Temp 98.1 F (36.7 C) (Oral)   Ht '4\' 10"'$  (1.473 m) Comment: stated  Wt 127 lb (57.6 kg) Comment: boot on L foot  SpO2 98%   BMI 26.54 kg/m    Physical Exam Constitutional:      Appearance: Normal appearance.  HENT:     Head: Normocephalic and atraumatic.      Mouth: Mucous membranes are moist.  Eyes:    Conjunctiva/sclera: Conjunctivae normal.     Pupils:   Cardiovascular:     Rate and Rhythm: Normal rate and  regular rhythm.     Heart sounds:  Pulmonary:     Effort: Pulmonary effort is normal.     Breath sounds: Normal breath sounds.   Abdominal:     General: Non distended     Palpations: soft.   Musculoskeletal:        General: Normal range of motion.  Left Foot      Skin:    General: Skin is warm and dry.     Comments:  Neurological:     General: grossly non focal     Mental Status: awake, alert and oriented to person, place, and time.  Psychiatric:        Mood and Affect: Mood normal.   Lab Results Lab Results  Component Value Date   WBC 7.4 01/24/2021   HGB 12.5 01/24/2021   HCT 37.6 01/24/2021   MCV 87 01/24/2021   PLT 340 01/24/2021    Lab Results  Component Value Date   CREATININE 0.68 01/24/2021   BUN 9 01/24/2021   NA 140 01/24/2021   K 4.5 01/24/2021   CL 103 01/24/2021   CO2 21 01/24/2021    Lab Results  Component Value Date   ALT 10 01/24/2021   AST 20 01/24/2021   ALKPHOS 75 01/24/2021   BILITOT 0.3 01/24/2021    Lab Results  Component Value Date   CHOL 287 (H) 01/24/2021   HDL 35 (L) 01/24/2021   LDLCALC 204 (H) 01/24/2021   TRIG 242 (H) 01/24/2021   CHOLHDL 8.2 (H) 01/24/2021   Lab Results  Component Value Date   LABRPR Non Reactive 06/27/2015   No results found for: "HIV1RNAQUANT", "HIV1RNAVL", "CD4TABS"   Microbiology  Results for orders placed or performed during the hospital encounter of 02/21/15  MRSA PCR Screening     Status: None   Collection Time: 02/22/15 12:37 AM   Specimen: Nasal Mucosa; Nasopharyngeal  Result Value Ref Range Status   MRSA by PCR NEGATIVE NEGATIVE Final    Comment:        The GeneXpert MRSA Assay (FDA approved for NASAL specimens only), is one component of a comprehensive MRSA colonization surveillance program. It is not intended to diagnose MRSA infection nor to guide or monitor treatment for MRSA infections.     Imaging MR FOOT LEFT WO CONTRAST  Result Date: 10/03/2021 CLINICAL DATA:   Ulcer on medial aspect of left first metatarsal head EXAM: MRI OF THE LEFT FOOT WITHOUT CONTRAST TECHNIQUE: Multiplanar, multisequence MR imaging of the left forefoot was performed. No intravenous contrast was administered. COMPARISON:  Left foot radiograph 09/11/2021 FINDINGS: Motion degraded exam. Bones/Joint/Cartilage There is marrow edema and associated mild low T1 signal involving the medial aspect of the distal first metatarsal head (axial T2 fat-sat and axial T1 images 19-22). There is no other significant marrow signal alteration. Hallux valgus with moderate first MTP degenerative change. Ligaments Intact Lisfranc ligament. Muscles and Tendons Diffuse intramuscular edema muscle atrophy as is commonly seen in diabetics. There is tenosynovial fluid along the flexor hallucis longus at the master knot of Mallie Mussel, which can be a normal finding. Soft tissues There is diffuse soft tissue swelling of the foot. There is a soft tissue ulcer along the medial forefoot. IMPRESSION: Medial forefoot soft tissue ulcer with underlying osteomyelitis of the adjacent first metatarsal head. Adjacent soft tissue swelling without well-defined/drainable abscess on noncontrast MRI. Electronically Signed   By: Maurine Simmering M.D.   On: 10/03/2021 11:43    Problem List Items Addressed This Visit       Musculoskeletal and Integument   Rheumatoid arthritis involving multiple sites with positive rheumatoid factor (HCC) - Primary   Osteomyelitis (HCC)   Relevant Medications   linezolid (ZYVOX) 600 MG tablet   Other Relevant Orders   CBC (Completed)   Basic metabolic panel (Completed)   C-reactive protein (Completed)   Sedimentation rate (Completed)   VAS [place] ABI WITH/WO TBI   CBC     Other   Medication monitoring encounter   Relevant Orders   CBC   Penicillin allergy   Assessment/Plan Left medial forefoot ulcer Osteomyelitis of left 1st metatarsal head  Penicillin allergy  Medication monitoring - discussed about  cytopenia, need to check CBC in 2 weeks, commons side effects associated with antibiotics   Linezolid and levofloxacin for 4 weeks  Labs today  ABI to screen for vascular disease  Fu in 2 weeks for CBC check  Fu in 3-4 weeks with me  Fu with wound care as instructed    I have personally spent 70 minutes involved in face-to-face and non-face-to-face activities for this patient on the day of the visit. Professional time spent includes the following activities: Preparing to see the patient (review of tests), Obtaining and/or reviewing separately obtained history (admission/discharge record), Performing a medically appropriate examination and/or evaluation , Ordering medications/tests/procedures, referring and communicating with other health care professionals, Documenting clinical information in the EMR, Independently interpreting results (not separately reported), Communicating results to the patient/family/caregiver, Counseling and educating the patient/family/caregiver and Care coordination (not separately reported).   Wilber Oliphant, [facility] 10/16/2021, 7:54 AM

## 2021-10-16 NOTE — Telephone Encounter (Signed)
Per patient request at appointment this morning, called patient's son [name] [phone]) to confirm f/u appointments in 3 weeks.  Patient is scheduled for follow-up lab appointment for CBC on 7/19 at 9:15am at [facility].  Patient is scheduled for VAS [place] ABI on 7/19 at 10am at [facility].   Per Dr. [place] Bali, ABI results will be called to [facility] triage.  Patient's son confirmed appointment times and stated understanding of the location of both appointments. Stated patient may arrive early for lab draw to ensure on-time arrival for ABI. Patient prefers family members interpret at appointments. All questions answered.  Binnie Kand, RN

## 2021-10-17 LAB — BASIC METABOLIC PANEL
BUN/Creatinine Ratio: 36 (calc) — ABNORMAL HIGH (ref 6–22)
BUN: 30 mg/dL — ABNORMAL HIGH (ref 7–25)
CO2: 25 mmol/L (ref 20–32)
Calcium: 9.1 mg/dL (ref 8.6–10.4)
Chloride: 104 mmol/L (ref 98–110)
Creat: 0.84 mg/dL (ref 0.60–0.95)
Glucose, Bld: 87 mg/dL (ref 65–99)
Potassium: 4.3 mmol/L (ref 3.5–5.3)
Sodium: 136 mmol/L (ref 135–146)

## 2021-10-17 LAB — CBC
HCT: 35.1 % (ref 35.0–45.0)
Hemoglobin: 11.8 g/dL (ref 11.7–15.5)
MCH: 29.8 pg (ref 27.0–33.0)
MCHC: 33.6 g/dL (ref 32.0–36.0)
MCV: 88.6 fL (ref 80.0–100.0)
MPV: 10.2 fL (ref 7.5–12.5)
Platelets: 296 10*3/uL (ref 140–400)
RBC: 3.96 10*6/uL (ref 3.80–5.10)
RDW: 13.3 % (ref 11.0–15.0)
WBC: 6.5 10*3/uL (ref 3.8–10.8)

## 2021-10-17 LAB — SEDIMENTATION RATE: Sed Rate: 41 mm/h — ABNORMAL HIGH (ref 0–30)

## 2021-10-17 LAB — C-REACTIVE PROTEIN: CRP: 3.5 mg/L (ref <8.0)

## 2021-10-23 ENCOUNTER — Encounter (HOSPITAL_BASED_OUTPATIENT_CLINIC_OR_DEPARTMENT_OTHER): Payer: Medicare Other | Attending: General Surgery | Admitting: General Surgery

## 2021-10-23 ENCOUNTER — Telehealth: Payer: Self-pay

## 2021-10-23 DIAGNOSIS — L89893 Pressure ulcer of other site, stage 3: Secondary | ICD-10-CM | POA: Diagnosis not present

## 2021-10-23 DIAGNOSIS — I10 Essential (primary) hypertension: Secondary | ICD-10-CM | POA: Insufficient documentation

## 2021-10-23 DIAGNOSIS — M069 Rheumatoid arthritis, unspecified: Secondary | ICD-10-CM | POA: Insufficient documentation

## 2021-10-23 DIAGNOSIS — L97526 Non-pressure chronic ulcer of other part of left foot with bone involvement without evidence of necrosis: Secondary | ICD-10-CM | POA: Insufficient documentation

## 2021-10-23 DIAGNOSIS — L97522 Non-pressure chronic ulcer of other part of left foot with fat layer exposed: Secondary | ICD-10-CM | POA: Diagnosis not present

## 2021-10-23 NOTE — Telephone Encounter (Signed)
Patient's son called, says Niya took her antibiotics on Saturday and Sunday, but stopped after that due to vomiting. She has not been able to keep anything down. Denies fevers. Patient's son is asking if antibiotics can be changed or if ondansetron can be sent to [facility] in [place]. Will route to provider.   He says Donyel just went to wound care this morning and her foot is still swollen and inflamed.    Beryle Flock, RN

## 2021-10-23 NOTE — Progress Notes (Signed)
[name], [name] ([id number]) Visit Report for 10/23/2021 Chief Complaint Document Details Patient Name: Date of Service: [name], [name] 10/23/2021 7:45 [name] M Medical Record Number: [id number] Patient Account Number: [id number] Date of Birth/Sex: Treating RN: 1932-08-10 (88 y.o. [name] Primary Care Provider: Dettinger, Vonna Kotyk Other Clinician: Referring Provider: Treating Provider/Extender: Fredirick Maudlin Dettinger, [name] in Treatment: 9 Information Obtained from: Patient Chief Complaint Patient is at the clinic for treatment of an open ulcer on her left medial first metatarsal head, secondary to friction and pressure. Electronic Signature(s) Signed: 10/23/2021 8:29:26 AM By: Fredirick Maudlin MD FACS Entered By: Fredirick Maudlin on 10/23/2021 08:29:26 -------------------------------------------------------------------------------- Debridement Details Patient Name: Date of Service: [name], [name] 10/23/2021 7:45 [name] M Medical Record Number: [id number] Patient Account Number: [id number] Date of Birth/Sex: Treating RN: 08-Apr-1933 (88 y.o. [name] Primary Care Provider: Dettinger, Vonna Kotyk Other Clinician: Referring Provider: Treating Provider/Extender: Fredirick Maudlin Dettinger, [name] in Treatment: 9 Debridement Performed for Assessment: Wound #1 Left,Medial Foot Performed By: Physician Fredirick Maudlin, MD Debridement Type: Debridement Level of Consciousness (Pre-procedure): Awake and Alert Pre-procedure Verification/Time Out Yes - 08:03 Taken: Start Time: 08:03 Pain Control: Lidocaine 4% T opical Solution T Area Debrided (L x W): otal 1.4 (cm) x 1.5 (cm) = 2.1 (cm) Tissue and other material debrided: Non-Viable, Slough, Biofilm, Slough Level: Non-Viable Tissue Debridement Description: Selective/Open Wound Instrument: Curette Bleeding: Minimum Hemostasis Achieved: Pressure Procedural Pain: 0 Post Procedural Pain: 0 Response to Treatment:  Procedure was tolerated well Level of Consciousness (Post- Awake and Alert procedure): Post Debridement Measurements of Total Wound Length: (cm) 1.4 Stage: Category/Stage III Width: (cm) 1.5 Depth: (cm) 0.1 Volume: (cm) 0.165 Character of Wound/Ulcer Post Debridement: Improved Post Procedure Diagnosis Same as Pre-procedure Electronic Signature(s) Signed: 10/23/2021 9:54:12 AM By: Fredirick Maudlin MD FACS Signed: 10/23/2021 5:10:18 PM By: Adline Peals Entered By: Adline Peals on 10/23/2021 08:04:39 -------------------------------------------------------------------------------- HPI Details Patient Name: Date of Service: [name], [name] 10/23/2021 7:45 [name] M Medical Record Number: [id number] Patient Account Number: [id number] Date of Birth/Sex: Treating RN: 05/20/1932 (88 y.o. [name] Primary Care Provider: Dettinger, Vonna Kotyk Other Clinician: Referring Provider: Treating Provider/Extender: Fredirick Maudlin Dettinger, [name] in Treatment: 9 History of Present Illness HPI Description: ADMISSION 08/20/2021 This is an 88 year old Spanish-speaking patient who is being evaluated for an ulcer on the medial aspect of the left first metatarsal head. She says that in February, her shoes were rubbing against [name] bunion in this location and she first developed [name] blister that ultimately broke down to an ulcer. She is not [name] diabetic and she does not smoke. She does have some high blood pressure as well as rheumatoid arthritis. ABI in clinic was noncompressible. When the wound first appeared, she was seen in urgent care and was started on Bactrim. Her PCP followed up and recommended daily Xeroform gauze dressing changes. Initially, it sounds like the wound was quite painful but it is now quite improved. Apparently she had been changing the dressing twice [name] day at [name] minimum, and per her PCP's note she may be scrubbing it quite aggressively up to 4 times [name] day. Today, the wound  is clean, but there is perimeter of undermining. I am not entirely sure that I am probing bone but we are certainly very close to it. The surface is [name] little bit pale but otherwise healthy. No odor or concern for infection. 08/27/2021: The wound is about the same today. There is [name] little bit more undermining  underneath the callus that is formed around the perimeter. The wound surface is pink and moist. No odor or significant drainage. 09/04/2021: The wound is the same size today. It is very clean and the degree of undermining is about the same. The surface is [name] little bit hypertrophic. We have been using silver collagen and the patient's son reports that they have been making [name] concerted effort to tuck the dressing into the undermined area of tissue. No concern for infection. 09/11/2021: The wound is really unchanged with perhaps [name] little bit less undermining. It is clean and they have been using silver collagen, taking care to tuck it into the undermined area. I thought we had applied for snap VAC approval last week, but for some reason we are unable to find this. She does say that the degree of pain she is experiencing is less and that it sometimes just throbs at night but is better than previously. 09/18/2021: Last week, due to lack of progression with the wound, I took [name] culture that grew out skin flora but I also got an x-ray that was suggestive of osteomyelitis. She continues to complain of pain and throbbing at night but nothing throughout the day. She has been approved for snap VAC use with [name] $70- $100 co-pay with each application. The wound itself really looks no different today. 09/25/2021: I initiated doxycycline last week and she has been taking this. We also applied the snap VAC. The wound looks markedly better today with significant closure of the undermined portion. She continues to have some throbbing pain at night, but otherwise is doing well. We ordered an MRI to get [name] more  definitive answer regarding the possibility of osteomyelitis, but they have not yet received an appointment for this. 10/02/2021: She had her MRI just prior to coming to clinic so I do not yet have any results. She continues to have throbbing pain at night. The wound itself is [name] bit smaller. There is some periwound moisture with [name] little bit of slough but the undermined area is almost closed and I am unable to probe to bone. 10/10/2021: Unfortunately, the MRI done last week showed evidence of osteomyelitis. I renewed her doxycycline prescription for an additional 4 weeks. She has an appointment with infectious disease next week. Her primary care provider prescribed diclofenac which seems to be helping with her throbbing nighttime pain. The wound is about the same size today but there is no undermining present whatsoever. The surface is healthy with just [name] small amount of slough on the most proximal aspect of the wound. 10/16/2021: T oday, she is complaining that for the last 3 days, her foot has been more swollen and tender. She does have some visible swelling present and her foot feels warmer than normal. There is some erythema surrounding her wound and over the dorsum of her foot. She says that when she takes [name] step, the ball of her foot hurts more than it has. She has been taking doxycycline and seems to be tolerating this well. Her son also purchased some Pro-Stat and she has been taking this, although she does not particularly care for the taste. She has an appointment with infectious disease this morning after our visit. The wound itself is [name] little bit smaller. The periwound skin is slightly macerated but there is no undermining and there is granulation tissue on the surface. No odor or purulent drainage. 10/23/2021: She saw infectious disease last week and they initiated linezolid and levofloxacin. Unfortunately, this  has resulted in [name] lot of nausea and vomiting on the patient's part. She is not  taking either medication with any food and this seems to be part of the issue. Her overall oral intake remains quite poor, despite trying to get Pro-Stat and other agents into her system. She did start taking [name] probiotic but subsequently stopped taking the antibiotic so its not clear if this has had any beneficial effect or not. She is also scheduled to have formal ABIs performed in [name] couple of weeks, again per infectious disease. Her wound is [name] little bit smaller today and has [name] clean surface with just [name] little bit of biofilm and slough accumulation. The undermining and tunneling remain closed. Electronic Signature(s) Signed: 10/23/2021 8:31:15 AM By: Fredirick Maudlin MD FACS Entered By: Fredirick Maudlin on 10/23/2021 08:31:14 -------------------------------------------------------------------------------- Physical Exam Details Patient Name: Date of Service: [name], [name] 10/23/2021 7:45 [name] M Medical Record Number: [id number] Patient Account Number: [id number] Date of Birth/Sex: Treating RN: Dec 04, 1932 (88 y.o. [name] Primary Care Provider: Dettinger, Vonna Kotyk Other Clinician: Referring Provider: Treating Provider/Extender: Fredirick Maudlin Dettinger, [name] in Treatment: 9 Constitutional Slightly hypertensive. Slightly tachycardic, asymptomatic. . . No acute distress.Marland Kitchen Respiratory Normal work of breathing on room air.. Notes 10/23/2021: Her wound is [name] little bit smaller today and has [name] clean surface with just [name] little bit of biofilm and slough accumulation. The undermining and tunneling remain closed. Her foot and leg, however remain more swollen and warm with [name] little bit of erythema. Electronic Signature(s) Signed: 10/23/2021 8:42:59 AM By: Fredirick Maudlin MD FACS Previous Signature: 10/23/2021 8:32:25 AM Version By: Fredirick Maudlin MD FACS Entered By: Fredirick Maudlin on 10/23/2021  08:42:59 -------------------------------------------------------------------------------- Physician Orders Details Patient Name: Date of Service: [name], [name] 10/23/2021 7:45 [name] M Medical Record Number: [id number] Patient Account Number: [id number] Date of Birth/Sex: Treating RN: 04-01-1933 (88 y.o. 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 - Dr. Celine Ahr - Room 2 - 7/12 at 8:30 AM Return [name] Bathing/ Shower/ Hygiene May shower with protection but do not get wound dressing(s) wet. Negative Presssure Wound Therapy Wound #1 Left,Medial Foot SNAP Vac to wound continuously at 152m/hg pressure Off-Loading Wound #1 Left,Medial Foot Open toe surgical shoe to: - L foot Wound Treatment Wound #1 - Foot Wound Laterality: Left, Medial Cleanser: Soap and Water 1 x Per Day/15 Days Discharge Instructions: May shower and wash wound with dial antibacterial soap and water prior to dressing change. Cleanser: Wound Cleanser 1 x Per Day/15 Days Discharge Instructions: Cleanse the wound with wound cleanser prior to applying [name] clean dressing using gauze sponges, not tissue or cotton balls. Prim Dressing: Promogran Prisma Matrix, 4.34 (sq in) (silver collagen) 1 x Per Day/15 Days ary Discharge Instructions: Moisten collagen with saline or hydrogel Prim Dressing: snap vac 1 x Per DBTD/17Days ary Discharge Instructions: SNAP vac as directed. Electronic Signature(s) Signed: 10/23/2021 9:54:12 AM By: CFredirick MaudlinMD FACS Entered By: CFredirick Maudlinon 10/23/2021  08:43:15 -------------------------------------------------------------------------------- Problem List Details Patient Name: Date of Service: [name], [name] 10/23/2021 7:45 [name] M Medical Record Number: [id number] Account Number: [id number] of Birth/Sex: Treating RN: [birth year](88y.o. 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 [name] Primary Care Provider: Dettinger, Vonna Kotyk Other Clinician: Referring Provider: Treating Provider/Extender: Fredirick Maudlin Dettinger, [name] in Treatment: 9 Subjective Chief Complaint Information obtained from Patient Patient is at the clinic for treatment of an open ulcer on her left medial first metatarsal head, secondary to friction and pressure. History of Present Illness (HPI) ADMISSION 08/20/2021 This is an 88 year old Spanish-speaking patient who is being evaluated for an ulcer on the medial aspect of the left first metatarsal head. She says that in February, her shoes were rubbing against [name] bunion in this location and she first developed [name] blister that ultimately broke down to an ulcer. She is not [name]  diabetic and she does not smoke. She does have some high blood pressure as well as rheumatoid arthritis. ABI in clinic was noncompressible. When the wound first appeared, she was seen in urgent care and was started on Bactrim. Her PCP followed up and recommended daily Xeroform gauze dressing changes. Initially, it sounds like the wound was quite painful but it is now quite improved. Apparently she had been changing the dressing twice [name] day at [name] minimum, and per her PCP's note she may be scrubbing it quite aggressively up to 4 times [name] day. Today, the wound is clean, but there is perimeter of undermining. I am not entirely sure that I am probing bone but we are certainly very close to it. The surface is [name] little bit pale but otherwise healthy. No odor or concern for infection. 08/27/2021: The wound is about the same today. There is [name] little bit more undermining underneath the callus that is formed around the perimeter. The wound surface is pink and moist. No odor or significant drainage. 09/04/2021: The wound is the same size today. It is very clean and the degree of undermining is about the same. The surface is [name] little bit hypertrophic. We have been using silver collagen and the patient's son reports that they have been making [name] concerted effort to tuck the dressing into the undermined area of tissue. No concern for infection. 09/11/2021: The wound is really unchanged with perhaps [name] little bit less undermining. It is clean and they have been using silver collagen, taking care to tuck it into the undermined area. I thought we had applied for snap VAC approval last week, but for some reason we are unable to find this. She does say that the degree of pain she is experiencing is less and that it sometimes just throbs at night but is better than previously. 09/18/2021: Last week, due to lack of progression with the wound, I took [name] culture that grew out skin flora but I also got an x-ray that was suggestive  of osteomyelitis. She continues to complain of pain and throbbing at night but nothing throughout the day. She has been approved for snap VAC use with [name] $70- $100 co-pay with each application.  The wound itself really looks no different today. 09/25/2021: I initiated doxycycline last week and she has been taking this. We also applied the snap VAC. The wound looks markedly better today with significant closure of the undermined portion. She continues to have some throbbing pain at night, but otherwise is doing well. We ordered an MRI to get [name] more definitive answer regarding the possibility of osteomyelitis, but they have not yet received an appointment for this. 10/02/2021: She had her MRI just prior to coming to clinic so I do not yet have any results. She continues to have throbbing pain at night. The wound itself is [name] bit smaller. There is some periwound moisture with [name] little bit of slough but the undermined area is almost closed and I am unable to probe to bone. 10/10/2021: Unfortunately, the MRI done last week showed evidence of osteomyelitis. I renewed her doxycycline prescription for an additional 4 weeks. She has an appointment with infectious disease next week. Her primary care provider prescribed diclofenac which seems to be helping with her throbbing nighttime pain. The wound is about the same size today but there is no undermining present whatsoever. The surface is healthy with just [name] small amount of slough on the most proximal aspect of the wound. 10/16/2021: T oday, she is complaining that for the last 3 days, her foot has been more swollen and tender. She does have some visible swelling present and her foot feels warmer than normal. There is some erythema surrounding her wound and over the dorsum of her foot. She says that when she takes [name] step, the ball of her foot hurts more than it has. She has been taking doxycycline and seems to be tolerating this well. Her son also purchased some  Pro-Stat and she has been taking this, although she does not particularly care for the taste. She has an appointment with infectious disease this morning after our visit. The wound itself is [name] little bit smaller. The periwound skin is slightly macerated but there is no undermining and there is granulation tissue on the surface. No odor or purulent drainage. 10/23/2021: She saw infectious disease last week and they initiated linezolid and levofloxacin. Unfortunately, this has resulted in [name] lot of nausea and vomiting on the patient's part. She is not taking either medication with any food and this seems to be part of the issue. Her overall oral intake remains quite poor, despite trying to get Pro-Stat and other agents into her system. She did start taking [name] probiotic but subsequently stopped taking the antibiotic so its not clear if this has had any beneficial effect or not. She is also scheduled to have formal ABIs performed in [name] couple of weeks, again per infectious disease. Her wound is [name] little bit smaller today and has [name] clean surface with just [name] little bit of biofilm and slough accumulation. The undermining and tunneling remain closed. Patient History Information obtained from Patient. Family History Cancer - Siblings, No family history of Diabetes, Heart Disease, Hereditary Spherocytosis, Hypertension, Kidney Disease, Lung Disease, Seizures, Stroke, Thyroid Problems, Tuberculosis. Social History Never smoker, Marital Status - Married, Alcohol Use - Never, Drug Use - No History, Caffeine Use - Daily. Medical History Eyes Patient has history of Cataracts Cardiovascular Patient has history of Hypertension Musculoskeletal Patient has history of Rheumatoid Arthritis - hands and feet Hospitalization/Surgery History - cataract surgery. Medical [name] Surgical History Notes nd Eyes macular degeneration Ear/Nose/Mouth/Throat GERD,  dysarthria Hematologic/Lymphatic dyslipidemia Cardiovascular palpitations Musculoskeletal osteoporosis, unsteady gait  Neurologic TIA Objective Constitutional Slightly hypertensive. Slightly tachycardic, asymptomatic. No acute distress.. Vitals Time Taken: 7:50 AM, Height: 60 in, Weight: 119 lbs, BMI: 23.2, Temperature: 98.3 F, Pulse: 101 bpm, Respiratory Rate: 18 breaths/min, Blood Pressure: 146/78 mmHg. Respiratory Normal work of breathing on room air.. General Notes: 10/23/2021: Her wound is [name] little bit smaller today and has [name] clean surface with just [name] little bit of biofilm and slough accumulation. The undermining and tunneling remain closed. Her foot and leg, however remain more swollen and warm with [name] little bit of erythema. Integumentary (Hair, Skin) Wound #1 status is Open. Original cause of wound was Gradually Appeared. The date acquired was: 05/22/2021. The wound has been in treatment 9 weeks. The wound is located on the Left,Medial Foot. The wound measures 1.4cm length x 1.5cm width x 0.1cm depth; 1.649cm^2 area and 0.165cm^3 volume. There is Fat Layer (Subcutaneous Tissue) exposed. There is no tunneling or undermining noted. There is [name] medium amount of serosanguineous drainage noted. The wound margin is well defined and not attached to the wound base. There is large (67-100%) red granulation within the wound bed. There is [name] small (1-33%) amount of necrotic tissue within the wound bed including Adherent Slough. Assessment Active Problems ICD-10 Non-pressure chronic ulcer of other part of left foot with bone involvement without evidence of necrosis Procedures Wound #1 Pre-procedure diagnosis of Wound #1 is [name] Pressure Ulcer located on the Left,Medial Foot . There was [name] Selective/Open Wound Non-Viable Tissue Debridement with [name] total area of 2.1 sq cm performed by Fredirick Maudlin, MD. With the following instrument(s): Curette to remove Non-Viable tissue/material. Material removed  includes Slough and Biofilm and after achieving pain control using Lidocaine 4% T opical Solution. No specimens were taken. [name] time out was conducted at 08:03, prior to the start of the procedure. [name] Minimum amount of bleeding was controlled with Pressure. The procedure was tolerated well with [name] pain level of 0 throughout and [name] pain level of 0 following the procedure. Post Debridement Measurements: 1.4cm length x 1.5cm width x 0.1cm depth; 0.165cm^3 volume. Post debridement Stage noted as Category/Stage III. Character of Wound/Ulcer Post Debridement is improved. Post procedure Diagnosis Wound #1: Same as Pre-Procedure Plan Follow-up Appointments: Return Appointment in 1 week. - Dr. Celine Ahr - Room 2 - 7/12 at 8:30 AM Bathing/ Shower/ Hygiene: May shower with protection but do not get wound dressing(s) wet. Negative Presssure Wound Therapy: Wound #1 Left,Medial Foot: SNAP Vac to wound continuously at 177m/hg pressure Off-Loading: Wound #1 Left,Medial Foot: Open toe surgical shoe to: - L foot WOUND #1: - Foot Wound Laterality: Left, Medial Cleanser: Soap and Water 1 x Per Day/15 Days Discharge Instructions: May shower and wash wound with dial antibacterial soap and water prior to dressing change. Cleanser: Wound Cleanser 1 x Per Day/15 Days Discharge Instructions: Cleanse the wound with wound cleanser prior to applying [name] clean dressing using gauze sponges, not tissue or cotton balls. Prim Dressing: Promogran Prisma Matrix, 4.34 (sq in) (silver collagen) 1 x Per Day/15 Days ary Discharge Instructions: Moisten collagen with saline or hydrogel Prim Dressing: snap vac 1 x Per DWUJ/81Days ary Discharge Instructions: SNAP vac as directed. 10/23/2021: Her wound is [name] little bit smaller today and has [name] clean surface with just [name] little bit of biofilm and slough accumulation. The undermining and tunneling remain closed. Her foot and leg, however remain more swollen and warm with [name] little bit of  erythema. I used [name] curette to debride slough and biofilm  from the wound surface. I encouraged the patient and her son to contact infectious disease to discuss the issues that she is having tolerating the medication. They may have some other options to offer her including the possibility of [name] PICC line and IV antibiotics. In the meantime, I encouraged her to eat something bland, such as saltine crackers, prior to taking her medication. I did caution her against taking any calcium containing products with the levofloxacin. We will continue using the snap VAC with silver collagen. I am concerned that the infection is not being adequately controlled at this time. We may consider hyperbaric oxygen therapy if we cannot get an adequate response to antibiotic treatment. Follow-up in 1 week. Electronic Signature(s) Signed: 10/23/2021 8:45:57 AM By: Fredirick Maudlin MD FACS Entered By: Fredirick Maudlin on 10/23/2021 08:45:56 -------------------------------------------------------------------------------- HxROS Details Patient Name: Date of Service: [name], [name] 10/23/2021 7:45 [name] M Medical Record Number: [id number] Patient Account Number: [id number] Date of Birth/Sex: Treating RN: 1932-05-17 (88 y.o. [name] Primary Care Provider: Dettinger, Vonna Kotyk Other Clinician: Referring Provider: Treating Provider/Extender: Fredirick Maudlin Dettinger, [name] in Treatment: 9 Information Obtained From Patient Eyes Medical History: Positive for: Cataracts Past Medical History Notes: macular degeneration Ear/Nose/Mouth/Throat Medical History: Past Medical History Notes: GERD, dysarthria Hematologic/Lymphatic Medical History: Past Medical History Notes: dyslipidemia Cardiovascular Medical History: Positive for: Hypertension Past Medical History Notes: palpitations Musculoskeletal Medical History: Positive for: Rheumatoid Arthritis - hands and feet Past Medical History  Notes: osteoporosis, unsteady gait Neurologic Medical History: Past Medical History Notes: TIA HBO Extended History Items Eyes: Cataracts Immunizations Pneumococcal Vaccine: Received Pneumococcal Vaccination: Yes Received Pneumococcal Vaccination On or After 60th Birthday: Yes Implantable Devices None Hospitalization / Surgery History Type of Hospitalization/Surgery cataract surgery Family and Social History Cancer: Yes - Siblings; Diabetes: No; Heart Disease: No; Hereditary Spherocytosis: No; Hypertension: No; Kidney Disease: No; Lung Disease: No; Seizures: No; Stroke: No; Thyroid Problems: No; Tuberculosis: No; Never smoker; Marital Status - Married; Alcohol Use: Never; Drug Use: No History; Caffeine Use: Daily; Financial Concerns: No; Food, Clothing or Shelter Needs: No; Support System Lacking: No; Transportation Concerns: No Electronic Signature(s) Signed: 10/23/2021 9:54:12 AM By: Fredirick Maudlin MD FACS Signed: 10/23/2021 5:10:18 PM By: Adline Peals Entered By: Fredirick Maudlin on 10/23/2021 08:32:16 -------------------------------------------------------------------------------- SuperBill Details Patient Name: Date of Service: [name], [name] 10/23/2021 Medical Record Number: [id number] Patient Account Number: [id number] Date of Birth/Sex: Treating RN: 1933-02-26 (88 y.o. [name] Primary Care Provider: Dettinger, Vonna Kotyk Other Clinician: Referring Provider: Treating Provider/Extender: Fredirick Maudlin Dettinger, [name] in Treatment: 9 Diagnosis Coding ICD-10 Codes Code Description [phone] Non-pressure chronic ulcer of other part of left foot with bone involvement without evidence of necrosis Facility Procedures CPT4 Code: [id number] Description: 7597 - DEBRIDE WOUND 1ST 20 SQ CM OR < ICD-10 Diagnosis Description L97.526 Non-pressure chronic ulcer of other part of left foot with bone involvement witho Modifier: ut evidence of necr Quantity:  1 osis Physician Procedures : CPT4 Code Description Modifier [id number] - WC PHYS LEVEL 3 - EST PT 25 ICD-10 Diagnosis Description 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 

## 2021-10-23 NOTE — Progress Notes (Signed)
[name], [name] ([id number]) Visit Report for 10/23/2021 Arrival Information Details Patient Name: Date of Service: [name], [name] 10/23/2021 7:45 A M Medical Record Number: [id number] Patient Account Number: [id number] Date of Birth/Sex: Treating RN: 13-Jan-1933 (88 y.o. Donney Rankins, Lovena Le Primary Care Kindall Swaby: Dettinger, Vonna Kotyk Other Clinician: Referring Avanni Turnbaugh: Treating Viktoria Gruetzmacher/Extender: Fredirick Maudlin Dettinger, [name] in Treatment: 9 Visit Information History Since Last Visit Added or deleted any medications: Yes Patient Arrived: Walker Any new allergies or adverse reactions: No Arrival Time: 07:48 Had a fall or experienced change in No Accompanied By: son activities of daily living that may affect Transfer Assistance: None risk of falls: Patient Identification Verified: Yes Signs or symptoms of abuse/neglect since last visito No Secondary Verification Process Completed: Yes Hospitalized since last visit: No Patient Requires Transmission-Based Precautions: No Implantable device outside of the clinic excluding No cellular tissue based products placed in the center since last visit: Has Dressing in Place as Prescribed: Yes Pain Present Now: Yes Electronic Signature(s) Signed: 10/23/2021 5:10:18 PM By: Adline Peals Entered By: Adline Peals on 10/23/2021 07:50:25 -------------------------------------------------------------------------------- Encounter Discharge Information Details Patient Name: Date of Service: [name], [name] 10/23/2021 7:45 A M Medical Record Number: [id number] Patient Account Number: [id number] Date of Birth/Sex: Treating RN: 10-16-1932 (88 y.o. [name] Primary Care Zaryiah Barz: Dettinger, Vonna Kotyk Other Clinician: Referring Tinie Mcgloin: Treating Marley Charlot/Extender: Fredirick Maudlin Dettinger, [name] in Treatment: 9 Encounter Discharge Information Items Post Procedure Vitals Discharge Condition: Stable Temperature  (F): 98.3 Ambulatory Status: Walker Pulse (bpm): 101 Discharge Destination: Home Respiratory Rate (breaths/min): 18 Transportation: Private Auto Blood Pressure (mmHg): 146/78 Accompanied By: son Schedule Follow-up Appointment: Yes Clinical Summary of Care: Patient Declined Electronic Signature(s) Signed: 10/23/2021 5:10:18 PM By: Adline Peals Entered By: Adline Peals on 10/23/2021 09:11:35 -------------------------------------------------------------------------------- Lower Extremity Assessment Details Patient Name: Date of Service: [name], [name] 10/23/2021 7:45 A M Medical Record Number: [id number] Patient Account Number: [id number] Date of Birth/Sex: Treating RN: 1932/09/12 (88 y.o. [name] Primary Care Donyell Carrell: Dettinger, Vonna Kotyk Other Clinician: Referring Chrisma Hurlock: Treating Clearnce Leja/Extender: Fredirick Maudlin Dettinger, [name] in Treatment: 9 Edema Assessment Assessed: Shirlyn Goltz: No] Patrice Paradise: No] E[Left: dema] [Right: :] Calf Left: Right: Point of Measurement: 33 cm From Medial Instep 31.2 cm Ankle Left: Right: Point of Measurement: 9 cm From Medial Instep 24.3 cm Vascular Assessment Pulses: Dorsalis Pedis Palpable: [Left:Yes] Electronic Signature(s) Signed: 10/23/2021 5:10:18 PM By: Adline Peals Entered By: Adline Peals on 10/23/2021 07:54:00 -------------------------------------------------------------------------------- Multi Wound Chart Details Patient Name: Date of Service: [name], [name] 10/23/2021 7:45 A M Medical Record Number: [id number] Patient Account Number: [id number] Date of Birth/Sex: Treating RN: [birth year] (88 y.o. [name] Primary Care Carsen Machi: Dettinger, Vonna Kotyk Other Clinician: Referring Mercer Peifer: Treating Kenniyah Sasaki/Extender: Fredirick Maudlin Dettinger, [name] in Treatment: 9 Vital Signs Height(in): 60 Pulse(bpm): 101 Weight(lbs): 119 Blood Pressure(mmHg): 146/78 Body Mass  Index(BMI): 23.2 Temperature(F): 98.3 Respiratory Rate(breaths/min): 18 Photos: [1:Left, Medial Foot] [N/A:N/A N/A] Wound Location: [1:Gradually Appeared] [N/A:N/A] Wounding Event: [1:Pressure Ulcer] [N/A:N/A] Primary Etiology: [1:Cataracts, Hypertension, Rheumatoid N/A] Comorbid History: [1:Arthritis 05/22/2021] [N/A:N/A] Date Acquired: [1:9] [N/A:N/A] Weeks of Treatment: [1:Open] [N/A:N/A] Wound Status: [1:No] [N/A:N/A] Wound Recurrence: [1:1.4x1.5x0.1] [N/A:N/A] Measurements L x W x D (cm) [1:1.649] [N/A:N/A] A (cm) : rea [1:0.165] [N/A:N/A] Volume (cm) : [1:52.30%] [N/A:N/A] % Reduction in A [1:rea: 52.30%] [N/A:N/A] % Reduction in Volume: [1:Category/Stage III] [N/A:N/A] Classification: [1:Medium] [N/A:N/A] Exudate A mount: [1:Serosanguineous] [N/A:N/A] Exudate Type: [1:red, brown] [N/A:N/A] Exudate Color: [1:Well defined, not attached] [N/A:N/A] Wound Margin: [  1:Large (67-100%)] [N/A:N/A] Granulation A mount: [1:Red] [N/A:N/A] Granulation Quality: [1:Small (1-33%)] [N/A:N/A] Necrotic A mount: [1:Fat Layer (Subcutaneous Tissue): Yes N/A] Exposed Structures: [1:Fascia: No Tendon: No Muscle: No Joint: No Bone: No Small (1-33%)] [N/A:N/A] Epithelialization: [1:Debridement - Selective/Open Wound N/A] Debridement: Pre-procedure Verification/Time Out 08:03 [N/A:N/A] Taken: [1:Lidocaine 4% Topical Solution] [N/A:N/A] Pain Control: [1:Slough] [N/A:N/A] Tissue Debrided: [1:Non-Viable Tissue] [N/A:N/A] Level: [1:2.1] [N/A:N/A] Debridement A (sq cm): [1:rea Curette] [N/A:N/A] Instrument: [1:Minimum] [N/A:N/A] Bleeding: [1:Pressure] [N/A:N/A] Hemostasis A chieved: [1:0] [N/A:N/A] Procedural Pain: [1:0] [N/A:N/A] Post Procedural Pain: [1:Procedure was tolerated well] [N/A:N/A] Debridement Treatment Response: [1:1.4x1.5x0.1] [N/A:N/A] Post Debridement Measurements L x W x D (cm) [1:0.165] [N/A:N/A] Post Debridement Volume: (cm) [1:Category/Stage III] [N/A:N/A] Post Debridement  Stage: [1:Debridement] [N/A:N/A] Treatment Notes Electronic Signature(s) Signed: 10/23/2021 8:29:06 AM By: Fredirick Maudlin MD FACS Signed: 10/23/2021 5:10:18 PM By: Adline Peals Entered By: Fredirick Maudlin on 10/23/2021 08:29:06 -------------------------------------------------------------------------------- Multi-Disciplinary Care Plan Details Patient Name: Date of Service: [name], [place] 10/23/2021 7:45 A M Medical Record Number: [id number] Patient Account Number: [id number] Date of Birth/Sex: Treating RN: [birth year] (88 y.o. [name] Primary Care Lamarius Dirr: Dettinger, Vonna Kotyk Other Clinician: Referring Bionca Mckey: Treating Lataria Courser/Extender: Fredirick Maudlin Dettinger, [name] in Treatment: 9 Active Inactive Abuse / Safety / Falls / Self Care Management Nursing Diagnoses: Impaired physical mobility Potential for falls Potential for injury related to falls Goals: Patient will remain injury free related to falls Date Initiated: 08/20/2021 Target Resolution Date: 11/15/2021 Goal Status: Active Patient/caregiver will demonstrate safe use of adaptive devices to increase mobility Date Initiated: 08/20/2021 Date Inactivated: 09/11/2021 Target Resolution Date: 10/11/2021 Goal Status: Met Patient/caregiver will verbalize understanding of skin care regimen Date Initiated: 08/20/2021 Date Inactivated: 09/11/2021 Target Resolution Date: 10/11/2021 Goal Status: Met Interventions: Assess fall risk on admission and as needed Assess impairment of mobility on admission and as needed per policy Provide education on fall prevention Notes: Wound/Skin Impairment Nursing Diagnoses: Impaired tissue integrity Knowledge deficit related to ulceration/compromised skin integrity Goals: Patient/caregiver will verbalize understanding of skin care regimen Date Initiated: 08/20/2021 Date Inactivated: 09/11/2021 Target Resolution Date: 09/10/2021 Goal Status: Met Ulcer/skin breakdown  will have a volume reduction of 30% by week 4 Date Initiated: 08/20/2021 Target Resolution Date: 11/15/2021 Goal Status: Active Interventions: Assess patient/caregiver ability to perform ulcer/skin care regimen upon admission and as needed Assess ulceration(s) every visit Treatment Activities: Referred to DME Jeneane Pieczynski for dressing supplies : 08/20/2021 Skin care regimen initiated : 08/20/2021 Topical wound management initiated : 08/20/2021 Notes: Electronic Signature(s) Signed: 10/23/2021 5:10:18 PM By: Adline Peals Entered By: Adline Peals on 10/23/2021 07:55:16 -------------------------------------------------------------------------------- Negative Pressure Wound Therapy Maintenance (NPWT) Details Patient Name: Date of Service: [name], [name] 10/23/2021 7:45 A M Medical Record Number: [id number] Patient Account Number: [id number] Date of Birth/Sex: Treating RN: October 26, 1932 (88 y.o. [name] Primary Care Raiya Stainback: Dettinger, Vonna Kotyk Other Clinician: Referring Zarrah Loveland: Treating Redith Drach/Extender: Fredirick Maudlin Dettinger, [name] in Treatment: 9 NPWT Maintenance Performed for: Wound #1 Left, Medial Foot Performed By: Adline Peals, RN Type: VAC System Coverage Size (sq cm): 2.1 Pressure Type: Constant Pressure Setting: 125 mmHG Drain Type: None Sponge/Dressing Type: Foam, Blue Date Initiated: 09/18/2021 Dressing Removed: Yes Quantity of Sponges/Gauze Removed: 1 Canister Changed: Yes Dressing Reapplied: Yes Quantity of Sponges/Gauze Inserted: 1 Days On NPWT : 36 Post Procedure Diagnosis Same as Pre-procedure Electronic Signature(s) Signed: 10/23/2021 5:10:18 PM By: Adline Peals Entered By: Adline Peals on 10/23/2021 11:11:12 -------------------------------------------------------------------------------- Pain Assessment Details Patient Name: Date of Service: [name],  [place] 10/23/2021 7:45 A M Medical Record Number:  [id number] Patient Account Number: [id number] Date of Birth/Sex: Treating RN: 1932-05-31 (88 y.o. [name] Primary Care Tully Burgo: Dettinger, Vonna Kotyk Other Clinician: Referring Chaise Mahabir: Treating Adrien Dietzman/Extender: Fredirick Maudlin Dettinger, [name] in Treatment: 9 Active Problems Location of Pain Severity and Description of Pain Patient Has Paino Yes Site Locations Pain Location: Pain in Ulcers Duration of the Pain. Constant / Intermittento Constant Rate the pain. Current Pain Level: 8 Pain Management and Medication Current Pain Management: Medication: Yes How does your wound impact your activities of daily livingo Sleep: Yes Electronic Signature(s) Signed: 10/23/2021 5:10:18 PM By: Adline Peals Entered By: Adline Peals on 10/23/2021 07:53:32 -------------------------------------------------------------------------------- Patient/Caregiver Education Details Patient Name: Date of Service: [name], [name] 7/5/2023andnbsp7:45 A M Medical Record Number: [id number] Patient Account Number: [id number] Date of Birth/Gender: Treating RN: [birth year] (88 y.o. [name] Primary Care Physician: Dettinger, Vonna Kotyk Other Clinician: Referring Physician: Treating Physician/Extender: Fredirick Maudlin Dettinger, [name] in Treatment: 9 Education Assessment Education Provided To: Patient Education Topics Provided Wound/Skin Impairment: Methods: Explain/Verbal Responses: Reinforcements needed, State content correctly [employer]) Signed: 10/23/2021 5:10:18 PM By: Adline Peals Entered By: Adline Peals on 10/23/2021 07:55:27 -------------------------------------------------------------------------------- Wound Assessment Details Patient Name: Date of Service: [name], [name] 10/23/2021 7:45 A M Medical Record Number: [id number] Patient Account Number: [id number] Date of Birth/Sex: Treating RN: [birth year] (88 y.o. [name] Primary Care Treonna Klee: Dettinger, Vonna Kotyk Other Clinician: Referring Belinda Schlichting: Treating Ambrielle Kington/Extender: Fredirick Maudlin Dettinger, [name] in Treatment: 9 Wound Status Wound Number: 1 Primary Etiology: Pressure Ulcer Wound Location: Left, Medial Foot Wound Status: Open Wounding Event: Gradually Appeared Comorbid History: Cataracts, Hypertension, Rheumatoid Arthritis Date Acquired: 05/22/2021 Weeks Of Treatment: 9 Clustered Wound: No Photos Wound Measurements Length: (cm) 1.4 Width: (cm) 1.5 Depth: (cm) 0.1 Area: (cm) 1.649 Volume: (cm) 0.165 % Reduction in Area: 52.3% % Reduction in Volume: 52.3% Epithelialization: Small (1-33%) Tunneling: No Undermining: No Wound Description Classification: Category/Stage III Wound Margin: Well defined, not attached Exudate Amount: Medium Exudate Type: Serosanguineous Exudate Color: red, brown Foul Odor After Cleansing: No Slough/Fibrino Yes Wound Bed Granulation Amount: Large (67-100%) Exposed Structure Granulation Quality: Red Fascia Exposed: No Necrotic Amount: Small (1-33%) Fat Layer (Subcutaneous Tissue) Exposed: Yes Necrotic Quality: Adherent Slough Tendon Exposed: No Muscle Exposed: No Joint Exposed: No Bone Exposed: No Treatment Notes Wound #1 (Foot) Wound Laterality: Left, Medial Cleanser Soap and Water Discharge Instruction: May shower and wash wound with dial antibacterial soap and water prior to dressing change. Wound Cleanser Discharge Instruction: Cleanse the wound with wound cleanser prior to applying a clean dressing using gauze sponges, not tissue or cotton balls. Peri-Wound Care Topical Primary Dressing Promogran Prisma Matrix, 4.34 (sq in) (silver collagen) Discharge Instruction: Moisten collagen with saline or hydrogel snap vac Discharge Instruction: SNAP vac as directed. Secondary Dressing Secured With Compression Wrap Compression Stockings Add-Ons Electronic  Signature(s) Signed: 10/23/2021 5:10:18 PM By: Adline Peals Entered By: Adline Peals on 10/23/2021 07:58:00 -------------------------------------------------------------------------------- Vitals Details Patient Name: Date of Service: [name], [name] 10/23/2021 7:45 A M Medical Record Number: [id number] Patient Account Number: [id number] Date of Birth/Sex: Treating RN: 14-Mar-1933 (88 y.o. [name] Primary Care Niurka Benecke: Dettinger, Vonna Kotyk Other Clinician: Referring Ammy Lienhard: Treating Edrik Rundle/Extender: Fredirick Maudlin Dettinger, [name] in Treatment: 9 Vital Signs Time Taken: 07:50 Temperature (F): 98.3 Height (in): 60 Pulse (bpm): 101 Weight (lbs): 119 Respiratory Rate (breaths/min): 18 Body Mass Index (BMI): 23.2 Blood Pressure (mmHg): 146/78 Reference Range:  80 - 120 mg / dl Electronic Signature(s) Signed: 10/23/2021 5:10:18 PM By: Adline Peals Entered By: Adline Peals on 10/23/2021 07:50:40

## 2021-10-23 NOTE — Telephone Encounter (Signed)
Relayed instructions to [facility] per Dr. West Bali. He has access to his mother's MyChart account and requests that written instructions be sent there. Asked him to please call with any further questions or concerns.   Beryle Flock, RN

## 2021-10-28 ENCOUNTER — Other Ambulatory Visit: Payer: Self-pay | Admitting: Family Medicine

## 2021-10-30 ENCOUNTER — Encounter (HOSPITAL_BASED_OUTPATIENT_CLINIC_OR_DEPARTMENT_OTHER): Payer: Medicare Other | Admitting: General Surgery

## 2021-10-30 DIAGNOSIS — L97522 Non-pressure chronic ulcer of other part of left foot with fat layer exposed: Secondary | ICD-10-CM | POA: Diagnosis not present

## 2021-10-30 DIAGNOSIS — M069 Rheumatoid arthritis, unspecified: Secondary | ICD-10-CM | POA: Diagnosis not present

## 2021-10-30 DIAGNOSIS — L97526 Non-pressure chronic ulcer of other part of left foot with bone involvement without evidence of necrosis: Secondary | ICD-10-CM | POA: Diagnosis not present

## 2021-10-30 DIAGNOSIS — I10 Essential (primary) hypertension: Secondary | ICD-10-CM | POA: Diagnosis not present

## 2021-10-30 DIAGNOSIS — L89893 Pressure ulcer of other site, stage 3: Secondary | ICD-10-CM | POA: Diagnosis not present

## 2021-10-30 NOTE — Progress Notes (Signed)
[name], [name] ([id number]) Visit Report for 10/30/2021 Chief Complaint Document Details Patient Name: Date of Service: A LV [name], [name] 10/30/2021 7:45 A M Medical Record Number: [id number] Patient Account Number: [id number] Date of Birth/Sex: Treating RN: 04/02/1933 (88 y.o. [name] Primary Care Provider: Dettinger, Vonna Kotyk Other Clinician: Referring Provider: Treating Provider/Extender: Fredirick Maudlin Dettinger, [name] in Treatment: 10 Information Obtained from: Patient Chief Complaint Patient is at the clinic for treatment of an open ulcer on her left medial first metatarsal head, secondary to friction and pressure. Electronic Signature(s) Signed: 10/30/2021 8:24:31 AM By: Fredirick Maudlin MD FACS Entered By: Fredirick Maudlin on 10/30/2021 08:24:31 -------------------------------------------------------------------------------- Debridement Details Patient Name: Date of Service: A LV [name], [name] 10/30/2021 7:45 A M Medical Record Number: [id number] Patient Account Number: [id number] Date of Birth/Sex: Treating RN: Jun 27, 1932 (88 y.o. [name] Primary Care Provider: Dettinger, Vonna Kotyk Other Clinician: Referring Provider: Treating Provider/Extender: Fredirick Maudlin Dettinger, [name] in Treatment: 10 Debridement Performed for Assessment: Wound #1 Left,Medial Foot Performed By: Physician Fredirick Maudlin, MD Debridement Type: Debridement Level of Consciousness (Pre-procedure): Awake and Alert Pre-procedure Verification/Time Out Yes - 08:03 Taken: Start Time: 08:03 Pain Control: Lidocaine 4% T opical Solution T Area Debrided (L x W): otal 1.2 (cm) x 1.6 (cm) = 1.92 (cm) Tissue and other material debrided: Non-Viable, [place] Level: Non-Viable Tissue Debridement Description: Selective/Open Wound Instrument: Curette Bleeding: Minimum Hemostasis Achieved: Pressure Procedural Pain: 0 Post Procedural Pain: 0 Response to Treatment:  Procedure was tolerated well Level of Consciousness (Post- Awake and Alert procedure): Post Debridement Measurements of Total Wound Length: (cm) 1.2 Stage: Category/Stage III Width: (cm) 1.6 Depth: (cm) 0.1 Volume: (cm) 0.151 Character of Wound/Ulcer Post Debridement: Improved Post Procedure Diagnosis Same as Pre-procedure Electronic Signature(s) Signed: 10/30/2021 9:14:37 AM By: Fredirick Maudlin MD FACS Signed: 10/30/2021 5:44:21 PM By: Adline Peals Entered By: Adline Peals on 10/30/2021 08:04:32 -------------------------------------------------------------------------------- HPI Details Patient Name: Date of Service: A LV [name], [name] 10/30/2021 7:45 A M Medical Record Number: [id number] Patient Account Number: [id number] Date of Birth/Sex: Treating RN: 09-Oct-1932 (88 y.o. [name] Primary Care Provider: Dettinger, Vonna Kotyk Other Clinician: Referring Provider: Treating Provider/Extender: Fredirick Maudlin Dettinger, [name] in Treatment: 10 History of Present Illness HPI Description: ADMISSION 08/20/2021 This is an 88 year old Spanish-speaking patient who is being evaluated for an ulcer on the medial aspect of the left first metatarsal head. She says that in February, her shoes were rubbing against a bunion in this location and she first developed a blister that ultimately broke down to an ulcer. She is not a diabetic and she does not smoke. She does have some high blood pressure as well as rheumatoid arthritis. ABI in clinic was noncompressible. When the wound first appeared, she was seen in urgent care and was started on Bactrim. Her PCP followed up and recommended daily Xeroform gauze dressing changes. Initially, it sounds like the wound was quite painful but it is now quite improved. Apparently she had been changing the dressing twice a day at a minimum, and per her PCP's note she may be scrubbing it quite aggressively up to 4 times a day. Today, the  wound is clean, but there is perimeter of undermining. I am not entirely sure that I am probing bone but we are certainly very close to it. The surface is a little bit pale but otherwise healthy. No odor or concern for infection. 08/27/2021: The wound is about the same today. There is a little bit more undermining underneath  the callus that is formed around the perimeter. The wound surface is pink and moist. No odor or significant drainage. 09/04/2021: The wound is the same size today. It is very clean and the degree of undermining is about the same. The surface is a little bit hypertrophic. We have been using silver collagen and the patient's son reports that they have been making a concerted effort to tuck the dressing into the undermined area of tissue. No concern for infection. 09/11/2021: The wound is really unchanged with perhaps a little bit less undermining. It is clean and they have been using silver collagen, taking care to tuck it into the undermined area. I thought we had applied for snap VAC approval last week, but for some reason we are unable to find this. She does say that the degree of pain she is experiencing is less and that it sometimes just throbs at night but is better than previously. 09/18/2021: Last week, due to lack of progression with the wound, I took a culture that grew out skin flora but I also got an x-ray that was suggestive of osteomyelitis. She continues to complain of pain and throbbing at night but nothing throughout the day. She has been approved for snap VAC use with a $70- $100 co-pay with each application. The wound itself really looks no different today. 09/25/2021: I initiated doxycycline last week and she has been taking this. We also applied the snap VAC. The wound looks markedly better today with significant closure of the undermined portion. She continues to have some throbbing pain at night, but otherwise is doing well. We ordered an MRI to get a more  definitive answer regarding the possibility of osteomyelitis, but they have not yet received an appointment for this. 10/02/2021: She had her MRI just prior to coming to clinic so I do not yet have any results. She continues to have throbbing pain at night. The wound itself is a bit smaller. There is some periwound moisture with a little bit of slough but the undermined area is almost closed and I am unable to probe to bone. 10/10/2021: Unfortunately, the MRI done last week showed evidence of osteomyelitis. I renewed her doxycycline prescription for an additional 4 weeks. She has an appointment with infectious disease next week. Her primary care provider prescribed diclofenac which seems to be helping with her throbbing nighttime pain. The wound is about the same size today but there is no undermining present whatsoever. The surface is healthy with just a small amount of slough on the most proximal aspect of the wound. 10/16/2021: T oday, she is complaining that for the last 3 days, her foot has been more swollen and tender. She does have some visible swelling present and her foot feels warmer than normal. There is some erythema surrounding her wound and over the dorsum of her foot. She says that when she takes a step, the ball of her foot hurts more than it has. She has been taking doxycycline and seems to be tolerating this well. Her son also purchased some Pro-Stat and she has been taking this, although she does not particularly care for the taste. She has an appointment with infectious disease this morning after our visit. The wound itself is a little bit smaller. The periwound skin is slightly macerated but there is no undermining and there is granulation tissue on the surface. No odor or purulent drainage. 10/23/2021: She saw infectious disease last week and they initiated linezolid and levofloxacin. Unfortunately, this has  resulted in a lot of nausea and vomiting on the patient's part. She is not  taking either medication with any food and this seems to be part of the issue. Her overall oral intake remains quite poor, despite trying to get Pro-Stat and other agents into her system. She did start taking a probiotic but subsequently stopped taking the antibiotic so its not clear if this has had any beneficial effect or not. She is also scheduled to have formal ABIs performed in a couple of weeks, again per infectious disease. Her wound is a little bit smaller today and has a clean surface with just a little bit of biofilm and slough accumulation. The undermining and tunneling remain closed. 10/30/2021: Her son contacted infectious disease and they adjusted her antibiotic regimen. She is tolerating the new antibiotics without nausea or vomiting. She feels like the swelling and pain in her foot has diminished since initiating antibiotic therapy. The wound is nearly flush with the surrounding skin and has granulation tissue with just a little bit of slough. Electronic Signature(s) Signed: 10/30/2021 8:32:49 AM By: Fredirick Maudlin MD FACS Entered By: Fredirick Maudlin on 10/30/2021 08:32:49 -------------------------------------------------------------------------------- Physical Exam Details Patient Name: Date of Service: A LV [name], [name] 10/30/2021 7:45 A M Medical Record Number: [id number] Patient Account Number: [id number] Date of Birth/Sex: Treating RN: 01-30-1933 (88 y.o. [name] Primary Care Provider: Dettinger, Vonna Kotyk Other Clinician: Referring Provider: Treating Provider/Extender: Fredirick Maudlin Dettinger, [name] in Treatment: 10 Constitutional . . . . No acute distress.Marland Kitchen Respiratory Normal work of breathing on room air.. Notes 10/30/2021: The wound is nearly flush with the surrounding skin and has granulation tissue with just a little bit of slough. Electronic Signature(s) Signed: 10/30/2021 8:35:24 AM By: Fredirick Maudlin MD FACS Entered By: Fredirick Maudlin  on 10/30/2021 08:35:24 -------------------------------------------------------------------------------- Physician Orders Details Patient Name: Date of Service: A LV [name], [name] 10/30/2021 7:45 A M Medical Record Number: [id number] Patient Account Number: [id number] Date of Birth/Sex: Treating RN: Feb 03, 1933 (88 y.o. [name] Primary Care Provider: Dettinger, Vonna Kotyk Other Clinician: Referring Provider: Treating Provider/Extender: Fredirick Maudlin Dettinger, [name] in Treatment: 10 Verbal / Phone Orders: No Diagnosis Coding ICD-10 Coding Code Description [phone] Non-pressure chronic ulcer of other part of left foot with bone involvement without evidence of necrosis Follow-up Appointments ppointment in 1 week. - Dr. Celine Ahr - Room 2 - 7/19 at 7:45 AM Return A Bathing/ Shower/ Hygiene May shower with protection but do not get wound dressing(s) wet. Negative Presssure Wound Therapy Wound #1 Left,Medial Foot SNAP Vac to wound continuously at 132m/hg pressure Off-Loading Wound #1 Left,Medial Foot Open toe surgical shoe to: - L foot Wound Treatment Wound #1 - Foot Wound Laterality: Left, Medial Cleanser: Soap and Water 1 x Per Day/15 Days Discharge Instructions: May shower and wash wound with dial antibacterial soap and water prior to dressing change. Cleanser: Wound Cleanser 1 x Per Day/15 Days Discharge Instructions: Cleanse the wound with wound cleanser prior to applying a clean dressing using gauze sponges, not tissue or cotton balls. Prim Dressing: Endoform 2x2 in 1 x Per Day/15 Days ary Discharge Instructions: Moisten with saline Prim Dressing: snap vac 1 x Per DQJJ/94Days ary Discharge Instructions: SNAP vac as directed. Electronic Signature(s) Signed: 10/30/2021 9:14:37 AM By: CFredirick MaudlinMD FACS Entered By: CFredirick Maudlinon 10/30/2021 08:35:35 -------------------------------------------------------------------------------- Problem List  Details Patient Name: Date of Service: A LV [name], [name] 10/30/2021 7:45 A M Medical Record Number: [id number] Account Number: [id number]  of Birth/Sex: Treating RN: 01-05-1933 (88 y.o. [name] Primary Care Provider: Dettinger, Vonna Kotyk Other Clinician: Referring Provider: Treating Provider/Extender: Fredirick Maudlin Dettinger, [name] in Treatment: 10 Active Problems ICD-10 Encounter Code Description Active Date MDM Diagnosis L97.526 Non-pressure chronic ulcer of other part of left foot with bone involvement 08/20/2021 No Yes without evidence of necrosis Inactive Problems Resolved Problems Electronic Signature(s) Signed: 10/30/2021 8:22:41 AM By: Fredirick Maudlin MD FACS Entered By: Fredirick Maudlin on 10/30/2021 08:22:41 -------------------------------------------------------------------------------- Progress Note Details Patient Name: Date of Service: A LV [name], [name] 10/30/2021 7:45 A M Medical Record Number: [id number] Patient Account Number: [id number] Date of Birth/Sex: Treating RN: 03/02/1933 (88 y.o. [name] Primary Care Provider: Dettinger, Vonna Kotyk Other Clinician: Referring Provider: Treating Provider/Extender: Fredirick Maudlin Dettinger, [name] in Treatment: 10 Subjective Chief Complaint Information obtained from Patient Patient is at the clinic for treatment of an open ulcer on her left medial first metatarsal head, secondary to friction and pressure. History of Present Illness (HPI) ADMISSION 08/20/2021 This is an 88 year old Spanish-speaking patient who is being evaluated for an ulcer on the medial aspect of the left first metatarsal head. She says that in February, her shoes were rubbing against a bunion in this location and she first developed a blister that ultimately broke down to an ulcer. She is not a diabetic and she does not smoke. She does have some high blood pressure as well as rheumatoid arthritis.  ABI in clinic was noncompressible. When the wound first appeared, she was seen in urgent care and was started on Bactrim. Her PCP followed up and recommended daily Xeroform gauze dressing changes. Initially, it sounds like the wound was quite painful but it is now quite improved. Apparently she had been changing the dressing twice a day at a minimum, and per her PCP's note she may be scrubbing it quite aggressively up to 4 times a day. Today, the wound is clean, but there is perimeter of undermining. I am not entirely sure that I am probing bone but we are certainly very close to it. The surface is a little bit pale but otherwise healthy. No odor or concern for infection. 08/27/2021: The wound is about the same today. There is a little bit more undermining underneath the callus that is formed around the perimeter. The wound surface is pink and moist. No odor or significant drainage. 09/04/2021: The wound is the same size today. It is very clean and the degree of undermining is about the same. The surface is a little bit hypertrophic. We have been using silver collagen and the patient's son reports that they have been making a concerted effort to tuck the dressing into the undermined area of tissue. No concern for infection. 09/11/2021: The wound is really unchanged with perhaps a little bit less undermining. It is clean and they have been using silver collagen, taking care to tuck it into the undermined area. I thought we had applied for snap VAC approval last week, but for some reason we are unable to find this. She does say that the degree of pain she is experiencing is less and that it sometimes just throbs at night but is better than previously. 09/18/2021: Last week, due to lack of progression with the wound, I took a culture that grew out skin flora but I also got an x-ray that was suggestive of osteomyelitis. She continues to complain of pain and throbbing at night but nothing throughout the day.  She has been approved for snap  VAC use with a $70- $100 co-pay with each application. The wound itself really looks no different today. 09/25/2021: I initiated doxycycline last week and she has been taking this. We also applied the snap VAC. The wound looks markedly better today with significant closure of the undermined portion. She continues to have some throbbing pain at night, but otherwise is doing well. We ordered an MRI to get a more definitive answer regarding the possibility of osteomyelitis, but they have not yet received an appointment for this. 10/02/2021: She had her MRI just prior to coming to clinic so I do not yet have any results. She continues to have throbbing pain at night. The wound itself is a bit smaller. There is some periwound moisture with a little bit of slough but the undermined area is almost closed and I am unable to probe to bone. 10/10/2021: Unfortunately, the MRI done last week showed evidence of osteomyelitis. I renewed her doxycycline prescription for an additional 4 weeks. She has an appointment with infectious disease next week. Her primary care provider prescribed diclofenac which seems to be helping with her throbbing nighttime pain. The wound is about the same size today but there is no undermining present whatsoever. The surface is healthy with just a small amount of slough on the most proximal aspect of the wound. 10/16/2021: T oday, she is complaining that for the last 3 days, her foot has been more swollen and tender. She does have some visible swelling present and her foot feels warmer than normal. There is some erythema surrounding her wound and over the dorsum of her foot. She says that when she takes a step, the ball of her foot hurts more than it has. She has been taking doxycycline and seems to be tolerating this well. Her son also purchased some Pro-Stat and she has been taking this, although she does not particularly care for the taste. She has an  appointment with infectious disease this morning after our visit. The wound itself is a little bit smaller. The periwound skin is slightly macerated but there is no undermining and there is granulation tissue on the surface. No odor or purulent drainage. 10/23/2021: She saw infectious disease last week and they initiated linezolid and levofloxacin. Unfortunately, this has resulted in a lot of nausea and vomiting on the patient's part. She is not taking either medication with any food and this seems to be part of the issue. Her overall oral intake remains quite poor, despite trying to get Pro-Stat and other agents into her system. She did start taking a probiotic but subsequently stopped taking the antibiotic so its not clear if this has had any beneficial effect or not. She is also scheduled to have formal ABIs performed in a couple of weeks, again per infectious disease. Her wound is a little bit smaller today and has a clean surface with just a little bit of biofilm and slough accumulation. The undermining and tunneling remain closed. 10/30/2021: Her son contacted infectious disease and they adjusted her antibiotic regimen. She is tolerating the new antibiotics without nausea or vomiting. She feels like the swelling and pain in her foot has diminished since initiating antibiotic therapy. The wound is nearly flush with the surrounding skin and has granulation tissue with just a little bit of slough. Patient History Information obtained from Patient. Family History Cancer - Siblings, No family history of Diabetes, Heart Disease, Hereditary Spherocytosis, Hypertension, Kidney Disease, Lung Disease, Seizures, Stroke, Thyroid Problems, Tuberculosis. Social History Never smoker,  Marital Status - Married, Alcohol Use - Never, Drug Use - No History, Caffeine Use - Daily. Medical History Eyes Patient has history of Cataracts Cardiovascular Patient has history of Hypertension Musculoskeletal Patient  has history of Rheumatoid Arthritis - hands and feet Hospitalization/Surgery History - cataract surgery. Medical A Surgical History Notes nd Eyes macular degeneration Ear/Nose/Mouth/Throat GERD, dysarthria Hematologic/Lymphatic dyslipidemia Cardiovascular palpitations Musculoskeletal osteoporosis, unsteady gait Neurologic TIA Objective Constitutional No acute distress.. Vitals Time Taken: 7:51 AM, Height: 60 in, Weight: 119 lbs, BMI: 23.2, Temperature: 98.4 F, Pulse: 75 bpm, Respiratory Rate: 18 breaths/min, Blood Pressure: 138/64 mmHg. Respiratory Normal work of breathing on room air.. General Notes: 10/30/2021: The wound is nearly flush with the surrounding skin and has granulation tissue with just a little bit of slough. Integumentary (Hair, Skin) Wound #1 status is Open. Original cause of wound was Gradually Appeared. The date acquired was: 05/22/2021. The wound has been in treatment 10 weeks. The wound is located on the Left,Medial Foot. The wound measures 1.2cm length x 1.6cm width x 0.1cm depth; 1.508cm^2 area and 0.151cm^3 volume. There is Fat Layer (Subcutaneous Tissue) exposed. There is no tunneling or undermining noted. There is a medium amount of serosanguineous drainage noted. The wound margin is well defined and not attached to the wound base. There is large (67-100%) red granulation within the wound bed. There is a small (1-33%) amount of necrotic tissue within the wound bed including Adherent Slough. Assessment Active Problems ICD-10 Non-pressure chronic ulcer of other part of left foot with bone involvement without evidence of necrosis Procedures Wound #1 Pre-procedure diagnosis of Wound #1 is a Pressure Ulcer located on the Left,Medial Foot . There was a Selective/Open Wound Non-Viable Tissue Debridement with a total area of 1.92 sq cm performed by Fredirick Maudlin, MD. With the following instrument(s): Curette to remove Non-Viable tissue/material.  Material removed includes [employer] [facility] after achieving pain control using Lidocaine 4% Topical Solution. No specimens were taken. A time out was conducted at 08:03, prior to the start of the procedure. A Minimum amount of bleeding was controlled with Pressure. The procedure was tolerated well with a pain level of 0 throughout and a pain level of 0 following the procedure. Post Debridement Measurements: 1.2cm length x 1.6cm width x 0.1cm depth; 0.151cm^3 volume. Post debridement Stage noted as Category/Stage III. Character of Wound/Ulcer Post Debridement is improved. Post procedure Diagnosis Wound #1: Same as Pre-Procedure Plan Follow-up Appointments: Return Appointment in 1 week. - Dr. Celine Ahr - Room 2 - 7/19 at 7:45 AM Bathing/ Shower/ Hygiene: May shower with protection but do not get wound dressing(s) wet. Negative Presssure Wound Therapy: Wound #1 Left,Medial Foot: SNAP Vac to wound continuously at 113m/hg pressure Off-Loading: Wound #1 Left,Medial Foot: Open toe surgical shoe to: - L foot WOUND #1: - Foot Wound Laterality: Left, Medial Cleanser: Soap and Water 1 x Per Day/15 Days Discharge Instructions: May shower and wash wound with dial antibacterial soap and water prior to dressing change. Cleanser: Wound Cleanser 1 x Per Day/15 Days Discharge Instructions: Cleanse the wound with wound cleanser prior to applying a clean dressing using gauze sponges, not tissue or cotton balls. Prim Dressing: Endoform 2x2 in 1 x Per Day/15 Days ary Discharge Instructions: Moisten with saline Prim Dressing: snap vac 1 x Per DOIZ/12Days ary Discharge Instructions: SNAP vac as directed. 10/30/2021: The wound is nearly flush with the surrounding skin and has granulation tissue with just a little bit of slough. Although the wound is about the same size, the  patient seems to be improving in terms of pain, warmth, and erythema since being on the antibiotics prescribed by infectious disease. I used a curette  to debride a little bit of slough and biofilm off the wound surface. I am not seeing quite the result that I would like with just the [employer] silver collagen and snap VAC so I am going to try endoform. I am also going to see if the [employer] product rep might be able to donate some product for her, as our results have been pretty impressive with this product. Follow-up in 1 week. Electronic Signature(s) Signed: 10/30/2021 8:37:10 AM By: Fredirick Maudlin MD FACS Entered By: Fredirick Maudlin on 10/30/2021 08:37:09 -------------------------------------------------------------------------------- HxROS Details Patient Name: Date of Service: A LV [name], [name] 10/30/2021 7:45 A M Medical Record Number: [id number] Patient Account Number: [id number] Date of Birth/Sex: Treating RN: 1933-01-01 (88 y.o. [name] Primary Care Provider: Dettinger, Vonna Kotyk Other Clinician: Referring Provider: Treating Provider/Extender: Fredirick Maudlin Dettinger, [name] in Treatment: 10 Information Obtained From Patient Eyes Medical History: Positive for: Cataracts Past Medical History Notes: macular degeneration Ear/Nose/Mouth/Throat Medical History: Past Medical History Notes: GERD, dysarthria Hematologic/Lymphatic Medical History: Past Medical History Notes: dyslipidemia Cardiovascular Medical History: Positive for: Hypertension Past Medical History Notes: palpitations Musculoskeletal Medical History: Positive for: Rheumatoid Arthritis - hands and feet Past Medical History Notes: osteoporosis, unsteady gait Neurologic Medical History: Past Medical History Notes: TIA HBO Extended History Items Eyes: Cataracts Immunizations Pneumococcal Vaccine: Received Pneumococcal Vaccination: Yes Received Pneumococcal Vaccination On or After 60th Birthday: Yes Implantable Devices None Hospitalization / Surgery History Type of Hospitalization/Surgery cataract surgery Family and Social  History Cancer: Yes - Siblings; Diabetes: No; Heart Disease: No; Hereditary Spherocytosis: No; Hypertension: No; Kidney Disease: No; Lung Disease: No; Seizures: No; Stroke: No; Thyroid Problems: No; Tuberculosis: No; Never smoker; Marital Status - Married; Alcohol Use: Never; Drug Use: No History; Caffeine Use: Daily; Financial Concerns: No; Food, Clothing or Shelter Needs: No; Support System Lacking: No; Transportation Concerns: No Electronic Signature(s) Signed: 10/30/2021 9:14:37 AM By: Fredirick Maudlin MD FACS Signed: 10/30/2021 5:44:21 PM By: Adline Peals Entered By: Fredirick Maudlin on 10/30/2021 08:32:54 -------------------------------------------------------------------------------- SuperBill Details Patient Name: Date of Service: A LV [name], [name] 10/30/2021 Medical Record Number: [id number] Patient Account Number: [id number] Date of Birth/Sex: Treating RN: 1932-10-28 (88 y.o. [name] Primary Care Provider: Dettinger, Vonna Kotyk Other Clinician: Referring Provider: Treating Provider/Extender: Fredirick Maudlin Dettinger, [name] in Treatment: 10 Diagnosis Coding ICD-10 Codes Code Description [phone] Non-pressure chronic ulcer of other part of left foot with bone involvement without evidence of necrosis Facility Procedures CPT4 Code: [id number] 9 Description: 7597 - DEBRIDE WOUND 1ST 20 SQ CM OR < ICD-10 Diagnosis Description L97.526 Non-pressure chronic ulcer of other part of left foot with bone involvement witho Modifier: ut evidence of necr Quantity: 1 osis CPT4 Code: 84696295 9 Description: 7605 - WOUND VAC-50 SQ CM OR LESS ICD-10 Diagnosis Description L97.526 Non-pressure chronic ulcer of other part of left foot with bone involvement witho Modifier: ut evidence of necr Quantity: 1 osis Physician Procedures : CPT4 Code Description Modifier [id number] 40102 - WC PHYS LEVEL 4 - EST PT 25 ICD-10 Diagnosis Description L97.526 Non-pressure chronic ulcer of other  part of left foot with bone involvement without evidence of necro Quantity: 1 sis : [id number] 40347 - WC PHYS DEBR WO ANESTH 20 SQ CM ICD-10 Diagnosis Description 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

## 2021-10-30 NOTE — Progress Notes (Signed)
[name], [name] ([id number]) Visit Report for 10/30/2021 Arrival Information Details Patient Name: Date of Service: A LV [name], [name] 10/30/2021 7:45 A M Medical Record Number: [id number] Patient Account Number: [id number] Date of Birth/Sex: Treating RN: 05-12-1932 (88 y.o. [name], [name] Primary Care Gaspare Netzel: Dettinger, Vonna Kotyk Other Clinician: Referring Jahmeir Geisen: Treating Kamylle Axelson/Extender: Fredirick Maudlin Dettinger, [name] in Treatment: 10 Visit Information History Since Last Visit Added or deleted any medications: No Patient Arrived: [name] Any new allergies or adverse reactions: No Arrival Time: 07:50 Had a fall or experienced change in No Accompanied By: son and translator activities of daily living that may affect Transfer Assistance: None risk of falls: Patient Identification Verified: Yes Signs or symptoms of abuse/neglect since last visito No Secondary Verification Process Completed: Yes Hospitalized since last visit: No Patient Requires Transmission-Based Precautions: No Implantable device outside of the clinic excluding No cellular tissue based products placed in the center since last visit: Has Dressing in Place as Prescribed: Yes Pain Present Now: Yes Electronic Signature(s) Signed: 10/30/2021 5:44:21 PM By: Adline Peals Entered By: Adline Peals on 10/30/2021 07:51:42 -------------------------------------------------------------------------------- Encounter Discharge Information Details Patient Name: Date of Service: A LV [name], [name] 10/30/2021 7:45 A M Medical Record Number: [id number] Patient Account Number: [id number] Date of Birth/Sex: Treating RN: 01-Oct-1932 (88 y.o. [name] Primary Care Ulric Salzman: Dettinger, Vonna Kotyk Other Clinician: Referring Ziv Welchel: Treating Latyra Jaye/Extender: Fredirick Maudlin Dettinger, [name] in Treatment: 10 Encounter Discharge Information Items Post Procedure Vitals Discharge Condition:  Stable Temperature (F): 98.4 Ambulatory Status: Walker Pulse (bpm): 75 Discharge Destination: Home Respiratory Rate (breaths/min): 18 Transportation: Private Auto Blood Pressure (mmHg): 136/64 Accompanied By: son and translator Schedule Follow-up Appointment: Yes Clinical Summary of Care: Patient Declined Electronic Signature(s) Signed: 10/30/2021 5:44:21 PM By: Adline Peals Entered By: Adline Peals on 10/30/2021 08:07:54 -------------------------------------------------------------------------------- Lower Extremity Assessment Details Patient Name: Date of Service: A LV [name], [name] 10/30/2021 7:45 A M Medical Record Number: [id number] Patient Account Number: [id number] Date of Birth/Sex: Treating RN: 1932/12/07 (88 y.o. [name] Primary Care Francisco Ostrovsky: Dettinger, Vonna Kotyk Other Clinician: Referring Tanicia Wolaver: Treating Rhonna Holster/Extender: Fredirick Maudlin Dettinger, [name] in Treatment: 10 Edema Assessment Assessed: [name]: No] [name]: No] E[Left: dema] [Right: :] Calf Left: Right: Point of Measurement: 33 cm From Medial Instep 30.5 cm Ankle Left: Right: Point of Measurement: 9 cm From Medial Instep 23.6 cm Vascular Assessment Pulses: Dorsalis Pedis Palpable: [Left:Yes] Electronic Signature(s) Signed: 10/30/2021 5:44:21 PM By: Adline Peals Entered By: Adline Peals on 10/30/2021 07:55:14 -------------------------------------------------------------------------------- Multi Wound Chart Details Patient Name: Date of Service: A LV [name], [name] 10/30/2021 7:45 A M Medical Record Number: [id number] Patient Account Number: [id number] Date of Birth/Sex: Treating RN: 1933/01/13 (88 y.o. [name] Primary Care Sherif Millspaugh: Dettinger, Vonna Kotyk Other Clinician: Referring Nathalia Wismer: Treating Kiaria Quinnell/Extender: Fredirick Maudlin Dettinger, [name] in Treatment: 10 Vital Signs Height(in): 60 Pulse(bpm): 75 Weight(lbs):  119 Blood Pressure(mmHg): 138/64 Body Mass Index(BMI): 23.2 Temperature(F): 98.4 Respiratory Rate(breaths/min): 18 Photos: [1:Left, Medial Foot] [N/A:N/A N/A] Wound Location: [1:Gradually Appeared] [N/A:N/A] Wounding Event: [1:Pressure Ulcer] [N/A:N/A] Primary Etiology: [1:Cataracts, Hypertension, Rheumatoid N/A] Comorbid History: [1:Arthritis 05/22/2021] [N/A:N/A] Date Acquired: [1:10] [N/A:N/A] Weeks of Treatment: [1:Open] [N/A:N/A] Wound Status: [1:No] [N/A:N/A] Wound Recurrence: [1:1.2x1.6x0.1] [N/A:N/A] Measurements L x W x D (cm) [1:1.508] [N/A:N/A] A (cm) : rea [1:0.151] [N/A:N/A] Volume (cm) : [1:56.40%] [N/A:N/A] % Reduction in A [1:rea: 56.40%] [N/A:N/A] % Reduction in Volume: [1:Category/Stage III] [N/A:N/A] Classification: [1:Medium] [N/A:N/A] Exudate A mount: [1:Serosanguineous] [N/A:N/A] Exudate Type: [1:red, brown] [N/A:N/A] Exudate Color: [1:Well defined, not  attached] [N/A:N/A] Wound Margin: [1:Large (67-100%)] [N/A:N/A] Granulation A mount: [1:Red] [N/A:N/A] Granulation Quality: [1:Small (1-33%)] [N/A:N/A] Necrotic A mount: [1:Fat Layer (Subcutaneous Tissue): Yes N/A] Exposed Structures: [1:Fascia: No Tendon: No Muscle: No Joint: No Bone: No Small (1-33%)] [N/A:N/A] Epithelialization: [1:Debridement - Selective/Open Wound N/A] Debridement: Pre-procedure Verification/Time Out 08:03 [N/A:N/A] Taken: [1:Lidocaine 4% Topical Solution] [N/A:N/A] Pain Control: [1:Slough] [N/A:N/A] Tissue Debrided: [1:Non-Viable Tissue] [N/A:N/A] Level: [1:1.92] [N/A:N/A] Debridement A (sq cm): [1:rea Curette] [N/A:N/A] Instrument: [1:Minimum] [N/A:N/A] Bleeding: [1:Pressure] [N/A:N/A] Hemostasis A chieved: [1:0] [N/A:N/A] Procedural Pain: [1:0] [N/A:N/A] Post Procedural Pain: [1:Procedure was tolerated well] [N/A:N/A] Debridement Treatment Response: [1:1.2x1.6x0.1] [N/A:N/A] Post Debridement Measurements L x W x D (cm) [1:0.151] [N/A:N/A] Post Debridement Volume: (cm)  [1:Category/Stage III] [N/A:N/A] Post Debridement Stage: [1:Debridement] [N/A:N/A] Procedures Performed: [1:Negative Pressure Wound Therapy Maintenance (NPWT)] Treatment Notes Wound #1 (Foot) Wound Laterality: Left, Medial Cleanser Soap and Water Discharge Instruction: May shower and wash wound with dial antibacterial soap and water prior to dressing change. Wound Cleanser Discharge Instruction: Cleanse the wound with wound cleanser prior to applying a clean dressing using gauze sponges, not tissue or cotton balls. Peri-Wound Care Topical Primary Dressing Endoform 2x2 in Discharge Instruction: Moisten with saline snap vac Discharge Instruction: SNAP vac as directed. Secondary Dressing Secured With Compression Wrap Compression Stockings Add-Ons Electronic Signature(s) Signed: 10/30/2021 8:23:05 AM By: Fredirick Maudlin MD FACS Signed: 10/30/2021 5:44:21 PM By: Sabas Sous By: Fredirick Maudlin on 10/30/2021 08:23:05 -------------------------------------------------------------------------------- Multi-Disciplinary Care Plan Details Patient Name: Date of Service: A LV [name], [name] 10/30/2021 7:45 A M Medical Record Number: [id number] Patient Account Number: [id number] Date of Birth/Sex: Treating RN: 1933-04-21 (88 y.o. [name] Primary Care Provider: Dettinger, Vonna Kotyk Other Clinician: Referring Provider: Treating Provider/Extender: Fredirick Maudlin Dettinger, [name] in Treatment: 10 Active Inactive Abuse / Safety / Falls / Self Care Management Nursing Diagnoses: Impaired physical mobility Potential for falls Potential for injury related to falls Goals: Patient will remain injury free related to falls Date Initiated: 08/20/2021 Target Resolution Date: 11/15/2021 Goal Status: Active Patient/caregiver will demonstrate safe use of adaptive devices to increase mobility Date Initiated: 08/20/2021 Date Inactivated: 09/11/2021 Target Resolution Date:  10/11/2021 Goal Status: Met Patient/caregiver will verbalize understanding of skin care regimen Date Initiated: 08/20/2021 Date Inactivated: 09/11/2021 Target Resolution Date: 10/11/2021 Goal Status: Met Interventions: Assess fall risk on admission and as needed Assess impairment of mobility on admission and as needed per policy Provide education on fall prevention Notes: Wound/Skin Impairment Nursing Diagnoses: Impaired tissue integrity Knowledge deficit related to ulceration/compromised skin integrity Goals: Patient/caregiver will verbalize understanding of skin care regimen Date Initiated: 08/20/2021 Date Inactivated: 09/11/2021 Target Resolution Date: 09/10/2021 Goal Status: Met Ulcer/skin breakdown will have a volume reduction of 30% by week 4 Date Initiated: 08/20/2021 Target Resolution Date: 11/15/2021 Goal Status: Active Interventions: Assess patient/caregiver ability to perform ulcer/skin care regimen upon admission and as needed Assess ulceration(s) every visit Treatment Activities: Referred to DME provider for dressing supplies : 08/20/2021 Skin care regimen initiated : 08/20/2021 Topical wound management initiated : 08/20/2021 Notes: Electronic Signature(s) Signed: 10/30/2021 5:44:21 PM By: Adline Peals Entered By: Adline Peals on 10/30/2021 07:59:51 -------------------------------------------------------------------------------- Negative Pressure Wound Therapy Maintenance (NPWT) Details Patient Name: Date of Service: A LV [name], [name] 10/30/2021 7:45 A M Medical Record Number: [id number] Patient Account Number: [id number] Date of Birth/Sex: Treating RN: 1932-07-28 (88 y.o. [name] Primary Care Provider: Dettinger, Vonna Kotyk Other Clinician: Referring Provider: Treating Provider/Extender: Fredirick Maudlin Dettinger, [name] in Treatment: 10 NPWT Maintenance Performed for:  Wound #1 Left, Medial Foot Performed By: Adline Peals, RN Type: VAC  System Coverage Size (sq cm): 1.92 Pressure Type: Constant Pressure Setting: 125 mmHG Drain Type: None Sponge/Dressing Type: Foam, Blue Date Initiated: 09/18/2021 Dressing Removed: Yes Quantity of Sponges/Gauze Removed: 1 Canister Changed: Yes Dressing Reapplied: Yes Quantity of Sponges/Gauze Inserted: 1 Days On NPWT : 43 Post Procedure Diagnosis Same as Pre-procedure Electronic Signature(s) Signed: 10/30/2021 5:44:21 PM By: Adline Peals Entered By: Adline Peals on 10/30/2021 08:08:32 -------------------------------------------------------------------------------- Pain Assessment Details Patient Name: Date of Service: A LV [name], [name] 10/30/2021 7:45 A M Medical Record Number: [id number] Patient Account Number: [id number] Date of Birth/Sex: Treating RN: 02/15/1933 (88 y.o. [name] Primary Care Yanelli Zapanta: Dettinger, Vonna Kotyk Other Clinician: Referring Latonda Larrivee: Treating Halen Antenucci/Extender: Fredirick Maudlin Dettinger, [name] in Treatment: 10 Active Problems Location of Pain Severity and Description of Pain Patient Has Paino Yes Site Locations Pain Location: Pain Location: Pain in Ulcers Duration of the Pain. Constant / Intermittento Intermittent Rate the pain. Current Pain Level: 7 Character of Pain Describe the Pain: Throbbing Pain Management and Medication Current Pain Management: Medication: Yes How does your wound impact your activities of daily livingo Sleep: Yes Electronic Signature(s) Signed: 10/30/2021 5:44:21 PM By: Adline Peals Entered By: Adline Peals on 10/30/2021 07:59:27 -------------------------------------------------------------------------------- Patient/Caregiver Education Details Patient Name: Date of Service: A LV [name], [name] 7/12/2023andnbsp7:45 A M Medical Record Number: [id number] Patient Account Number: [id number] Date of Birth/Gender: Treating RN: 03/05/1933 (88 y.o. [name] Primary Care Physician: Dettinger, Vonna Kotyk Other Clinician: Referring Physician: Treating Physician/Extender: Fredirick Maudlin Dettinger, [name] in Treatment: 10 Education Assessment Education Provided To: Patient Education Topics Provided Wound/Skin Impairment: Methods: Explain/Verbal Responses: Reinforcements needed, State content correctly Electronic Signature(s) Signed: 10/30/2021 5:44:21 PM By: Adline Peals Entered By: Adline Peals on 10/30/2021 08:00:04 -------------------------------------------------------------------------------- Wound Assessment Details Patient Name: Date of Service: A LV [name], [name] 10/30/2021 7:45 A M Medical Record Number: [id number] Patient Account Number: [id number] Date of Birth/Sex: Treating RN: 08-Feb-1933 (88 y.o. [name] Primary Care Chyanne Kohut: Dettinger, Vonna Kotyk Other Clinician: Referring Eunice Oldaker: Treating Kellee Sittner/Extender: Fredirick Maudlin Dettinger, [name] in Treatment: 10 Wound Status Wound Number: 1 Primary Etiology: Pressure Ulcer Wound Location: Left, Medial Foot Wound Status: Open Wounding Event: Gradually Appeared Comorbid History: Cataracts, Hypertension, Rheumatoid Arthritis Date Acquired: 05/22/2021 Weeks Of Treatment: 10 Clustered Wound: No Photos Wound Measurements Length: (cm) 1.2 Width: (cm) 1.6 Depth: (cm) 0.1 Area: (cm) 1.508 Volume: (cm) 0.151 % Reduction in Area: 56.4% % Reduction in Volume: 56.4% Epithelialization: Small (1-33%) Tunneling: No Undermining: No Wound Description Classification: Category/Stage III Wound Margin: Well defined, not attached Exudate Amount: Medium Exudate Type: Serosanguineous Exudate Color: red, brown Foul Odor After Cleansing: No Slough/Fibrino Yes Wound Bed Granulation Amount: Large (67-100%) Exposed Structure Granulation Quality: Red Fascia Exposed: No Necrotic Amount: Small (1-33%) Fat Layer (Subcutaneous Tissue) Exposed:  Yes Necrotic Quality: Adherent Slough Tendon Exposed: No Muscle Exposed: No Joint Exposed: No Bone Exposed: No Treatment Notes Wound #1 (Foot) Wound Laterality: Left, Medial Cleanser Soap and Water Discharge Instruction: May shower and wash wound with dial antibacterial soap and water prior to dressing change. Wound Cleanser Discharge Instruction: Cleanse the wound with wound cleanser prior to applying a clean dressing using gauze sponges, not tissue or cotton balls. Peri-Wound Care Topical Primary Dressing Endoform 2x2 in Discharge Instruction: Moisten with saline snap vac Discharge Instruction: SNAP vac as directed. Secondary Dressing Secured With Compression Wrap Compression Stockings Add-Ons Electronic Signature(s) Signed: 10/30/2021 5:44:21 PM By: Pollie Friar,  [name] Entered By: Adline Peals on 10/30/2021 07:58:27 -------------------------------------------------------------------------------- Vitals Details Patient Name: Date of Service: A LV [name], [name] 10/30/2021 7:45 A M Medical Record Number: [id number] Patient Account Number: [id number] Date of Birth/Sex: Treating RN: 1932/09/12 (88 y.o. [name] Primary Care Tessah Patchen: Dettinger, Vonna Kotyk Other Clinician: Referring Khole Branch: Treating Lurae Hornbrook/Extender: Fredirick Maudlin Dettinger, [name] in Treatment: 10 Vital Signs Time Taken: 07:51 Temperature (F): 98.4 Height (in): 60 Pulse (bpm): 75 Weight (lbs): 119 Respiratory Rate (breaths/min): 18 Body Mass Index (BMI): 23.2 Blood Pressure (mmHg): 138/64 Reference Range: 80 - 120 mg / dl Electronic Signature(s) Signed: 10/30/2021 5:44:21 PM By: Adline Peals Entered By: Adline Peals on 10/30/2021 07:53:12

## 2021-11-06 ENCOUNTER — Encounter (HOSPITAL_BASED_OUTPATIENT_CLINIC_OR_DEPARTMENT_OTHER): Payer: Medicare Other | Admitting: General Surgery

## 2021-11-06 ENCOUNTER — Other Ambulatory Visit: Payer: Medicare Other

## 2021-11-06 ENCOUNTER — Other Ambulatory Visit: Payer: Self-pay

## 2021-11-06 ENCOUNTER — Ambulatory Visit (HOSPITAL_COMMUNITY)
Admission: RE | Admit: 2021-11-06 | Discharge: 2021-11-06 | Disposition: A | Discharge status: Home / Self Care | Payer: Medicare Other | Source: Ambulatory Visit | Attending: Infectious Diseases | Admitting: Infectious Diseases

## 2021-11-06 DIAGNOSIS — I10 Essential (primary) hypertension: Secondary | ICD-10-CM | POA: Diagnosis not present

## 2021-11-06 DIAGNOSIS — R936 Abnormal findings on diagnostic imaging of limbs: Secondary | ICD-10-CM

## 2021-11-06 DIAGNOSIS — Z5181 Encounter for therapeutic drug level monitoring: Secondary | ICD-10-CM | POA: Diagnosis not present

## 2021-11-06 DIAGNOSIS — L97526 Non-pressure chronic ulcer of other part of left foot with bone involvement without evidence of necrosis: Secondary | ICD-10-CM | POA: Diagnosis not present

## 2021-11-06 DIAGNOSIS — L89893 Pressure ulcer of other site, stage 3: Secondary | ICD-10-CM | POA: Diagnosis not present

## 2021-11-06 DIAGNOSIS — M869 Osteomyelitis, unspecified: Secondary | ICD-10-CM | POA: Insufficient documentation

## 2021-11-06 DIAGNOSIS — M069 Rheumatoid arthritis, unspecified: Secondary | ICD-10-CM | POA: Diagnosis not present

## 2021-11-06 LAB — CBC
HCT: 37.6 % (ref 35.0–45.0)
Hemoglobin: 12.5 g/dL (ref 11.7–15.5)
MCH: 29.5 pg (ref 27.0–33.0)
MCHC: 33.2 g/dL (ref 32.0–36.0)
MCV: 88.7 fL (ref 80.0–100.0)
MPV: 9.3 fL (ref 7.5–12.5)
Platelets: 348 10*3/uL (ref 140–400)
RBC: 4.24 10*6/uL (ref 3.80–5.10)
RDW: 13.5 % (ref 11.0–15.0)
WBC: 7 10*3/uL (ref 3.8–10.8)

## 2021-11-06 NOTE — Progress Notes (Signed)
[name], [name] ([id number]) Visit Report for 11/06/2021 Arrival Information Details Patient Name: Date of Service: [name], [name] 11/06/2021 7:45 [name] M Medical Record Number: [id number] Patient Account Number: [id number] Date of Birth/Sex: Treating RN: 03-12-1933 (88 y.o. [name] Primary Care Jaliza Seifried: Dettinger, Vonna Kotyk Other Clinician: Referring Xyla Leisner: Treating Tomasina Keasling/Extender: Fredirick Maudlin Dettinger, [name] in Treatment: 11 Visit Information History Since Last Visit Added or deleted any medications: No Patient Arrived: [name] Any new allergies or adverse reactions: No Arrival Time: 07:48 Had [name] fall or experienced change in No Accompanied By: son and translator activities of daily living that may affect Transfer Assistance: None risk of falls: Patient Identification Verified: Yes Signs or symptoms of abuse/neglect since last visito No Secondary Verification Process Completed: Yes Hospitalized since last visit: No Patient Requires Transmission-Based Precautions: No Implantable device outside of the clinic excluding No cellular tissue based products placed in the center since last visit: Has Dressing in Place as Prescribed: Yes Pain Present Now: Yes Electronic Signature(s) Signed: 11/06/2021 5:01:06 PM By: Adline Peals Entered By: Adline Peals on 11/06/2021 07:49:10 -------------------------------------------------------------------------------- Encounter Discharge Information Details Patient Name: Date of Service: [name], [name] 11/06/2021 7:45 [name] M Medical Record Number: [id number] Patient Account Number: [id number] Date of Birth/Sex: Treating RN: 08-17-1932 (88 y.o. [name] Primary Care Giovanni Bath: Dettinger, Vonna Kotyk Other Clinician: Referring Damari Hiltz: Treating Aleigh Grunden/Extender: Fredirick Maudlin Dettinger, [name] in Treatment: 11 Encounter Discharge Information Items Post Procedure Vitals Discharge Condition:  Stable Temperature (F): 97.7 Ambulatory Status: Walker Pulse (bpm): 71 Discharge Destination: Home Respiratory Rate (breaths/min): 18 Transportation: Private Auto Blood Pressure (mmHg): 144/73 Accompanied By: son and translator Schedule Follow-up Appointment: Yes Clinical Summary of Care: Patient Declined Electronic Signature(s) Signed: 11/06/2021 5:01:06 PM By: Adline Peals Entered By: Adline Peals on 11/06/2021 09:18:27 -------------------------------------------------------------------------------- Lower Extremity Assessment Details Patient Name: Date of Service: [name], [name] 11/06/2021 7:45 [name] M Medical Record Number: [id number] Patient Account Number: [id number] Date of Birth/Sex: Treating RN: May 14, 1932 (88 y.o. [name] Primary Care Izeah Vossler: Dettinger, Vonna Kotyk Other Clinician: Referring Korban Shearer: Treating Shaney Deckman/Extender: Fredirick Maudlin Dettinger, [name] in Treatment: 11 Edema Assessment Assessed: Shirlyn Goltz: No] Patrice Paradise: No] E[Left: dema] [Right: :] Calf Left: Right: Point of Measurement: 33 cm From Medial Instep 30.4 cm Ankle Left: Right: Point of Measurement: 9 cm From Medial Instep 22.9 cm Vascular Assessment Pulses: Dorsalis Pedis Palpable: [Left:Yes] Electronic Signature(s) Signed: 11/06/2021 5:01:06 PM By: Adline Peals Entered By: Adline Peals on 11/06/2021 07:54:02 -------------------------------------------------------------------------------- Multi Wound Chart Details Patient Name: Date of Service: [name], [name] 11/06/2021 7:45 [name] M Medical Record Number: [id number] Patient Account Number: [id number] Date of Birth/Sex: Treating RN: 1932/11/15 (88 y.o. [name] Primary Care Abiha Lukehart: Dettinger, Vonna Kotyk Other Clinician: Referring Tinie Mcgloin: Treating Valli Randol/Extender: Fredirick Maudlin Dettinger, [name] in Treatment: 11 Vital Signs Height(in): 60 Pulse(bpm): 71 Weight(lbs):  119 Blood Pressure(mmHg): 144/73 Body Mass Index(BMI): 23.2 Temperature(F): 97.7 Respiratory Rate(breaths/min): 18 Photos: [1:Left, Medial Foot] [N/[name]:N/[name] N/[name]] Wound Location: [1:Gradually Appeared] [N/[name]:N/[name]] Wounding Event: [1:Pressure Ulcer] [N/[name]:N/[name]] Primary Etiology: [1:Cataracts, Hypertension, Rheumatoid N/[name]] Comorbid History: [1:Arthritis 05/22/2021] [N/[name]:N/[name]] Date Acquired: [1:11] [N/[name]:N/[name]] Weeks of Treatment: [1:Open] [N/[name]:N/[name]] Wound Status: [1:No] [N/[name]:N/[name]] Wound Recurrence: [1:1.5x1.4x0.1] [N/[name]:N/[name]] Measurements L x W x D (cm) [1:1.649] [N/[name]:N/[name]] [name] (cm) : rea [1:0.165] [N/[name]:N/[name]] Volume (cm) : [1:52.30%] [N/[name]:N/[name]] % Reduction in [name] [1:rea: 52.30%] [N/[name]:N/[name]] % Reduction in Volume: [1:Category/Stage III] [N/[name]:N/[name]] Classification: [1:Medium] [N/[name]:N/[name]] Exudate [name] mount: [1:Serosanguineous] [N/[name]:N/[name]] Exudate Type: [1:red, brown] [N/[name]:N/[name]] Exudate Color: [1:Well defined, not  attached] [N/[name]:N/[name]] Wound Margin: [1:Medium (34-66%)] [N/[name]:N/[name]] Granulation [name] mount: [1:Red] [N/[name]:N/[name]] Granulation Quality: [1:Medium (34-66%)] [N/[name]:N/[name]] Necrotic [name] mount: [1:Fat Layer (Subcutaneous Tissue): Yes N/[name]] Exposed Structures: [1:Fascia: No Tendon: No Muscle: No Joint: No Bone: No Small (1-33%)] [N/[name]:N/[name]] Epithelialization: [1:Debridement - Selective/Open Wound N/[name]] Debridement: Pre-procedure Verification/Time Out 08:03 [N/[name]:N/[name]] Taken: [1:Lidocaine 5% topical ointment] [N/[name]:N/[name]] Pain Control: [1:Slough] [N/[name]:N/[name]] Tissue Debrided: [1:Non-Viable Tissue] [N/[name]:N/[name]] Level: [1:2.1] [N/[name]:N/[name]] Debridement [name] (sq cm): [1:rea Curette] [N/[name]:N/[name]] Instrument: [1:Minimum] [N/[name]:N/[name]] Bleeding: [1:Pressure] [N/[name]:N/[name]] Hemostasis [name] chieved: [1:4] [N/[name]:N/[name]] Procedural Pain: [1:1] [N/[name]:N/[name]] Post Procedural Pain: [1:Procedure was tolerated well] [N/[name]:N/[name]] Debridement Treatment Response: [1:1.5x1.4x0.1] [N/[name]:N/[name]] Post Debridement Measurements L x W x D (cm) [1:0.165] [N/[name]:N/[name]] Post Debridement Volume: (cm)  [1:Category/Stage III] [N/[name]:N/[name]] Post Debridement Stage: [1:Debridement] [N/[name]:N/[name]] Treatment Notes Electronic Signature(s) Signed: 11/06/2021 8:33:36 AM By: Fredirick Maudlin MD FACS Signed: 11/06/2021 5:01:06 PM By: Adline Peals Entered By: Fredirick Maudlin on 11/06/2021 08:33:36 -------------------------------------------------------------------------------- Multi-Disciplinary Care Plan Details Patient Name: Date of Service: [name], Sanayah 11/06/2021 7:45 [name] M Medical Record Number: [id number] Patient Account Number: [id number] Date of Birth/Sex: Treating RN: [birth year] (88 y.o. [name] Primary Care Dionel Archey: Dettinger, Vonna Kotyk Other Clinician: Referring Kareem Aul: Treating Angad Nabers/Extender: Fredirick Maudlin Dettinger, [name] in Treatment: 11 Active Inactive Abuse / Safety / Falls / Self Care Management Nursing Diagnoses: Impaired physical mobility Potential for falls Potential for injury related to falls Goals: Patient will remain injury free related to falls Date Initiated: 08/20/2021 Target Resolution Date: 11/15/2021 Goal Status: Active Patient/caregiver will demonstrate safe use of adaptive devices to increase mobility Date Initiated: 08/20/2021 Date Inactivated: 09/11/2021 Target Resolution Date: 10/11/2021 Goal Status: Met Patient/caregiver will verbalize understanding of skin care regimen Date Initiated: 08/20/2021 Date Inactivated: 09/11/2021 Target Resolution Date: 10/11/2021 Goal Status: Met Interventions: Assess fall risk on admission and as needed Assess impairment of mobility on admission and as needed per policy Provide education on fall prevention Notes: Wound/Skin Impairment Nursing Diagnoses: Impaired tissue integrity Knowledge deficit related to ulceration/compromised skin integrity Goals: Patient/caregiver will verbalize understanding of skin care regimen Date Initiated: 08/20/2021 Date Inactivated: 09/11/2021 Target Resolution  Date: 09/10/2021 Goal Status: Met Ulcer/skin breakdown will have [name] volume reduction of 30% by week 4 Date Initiated: 08/20/2021 Target Resolution Date: 11/15/2021 Goal Status: Active Interventions: Assess patient/caregiver ability to perform ulcer/skin care regimen upon admission and as needed Assess ulceration(s) every visit Treatment Activities: Referred to DME Tegan Britain for dressing supplies : 08/20/2021 Skin care regimen initiated : 08/20/2021 Topical wound management initiated : 08/20/2021 Notes: Electronic Signature(s) Signed: 11/06/2021 5:01:06 PM By: Adline Peals Entered By: Adline Peals on 11/06/2021 07:55:40 -------------------------------------------------------------------------------- Pain Assessment Details Patient Name: Date of Service: [name], [name] 11/06/2021 7:45 [name] M Medical Record Number: [id number] Patient Account Number: [id number] Date of Birth/Sex: Treating RN: 03/04/1933 (88 y.o. [name] Primary Care Daleisa Halperin: Dettinger, Vonna Kotyk Other Clinician: Referring Kjerstin Abrigo: Treating Ernie Sagrero/Extender: Fredirick Maudlin Dettinger, [name] in Treatment: 11 Active Problems Location of Pain Severity and Description of Pain Patient Has Paino Yes Site Locations Pain Location: Pain Location: Pain in Ulcers Duration of the Pain. Constant / Intermittento Intermittent Rate the pain. Current Pain Level: 7 Character of Pain Describe the Pain: Throbbing Pain Management and Medication Current Pain Management: Medication: Yes How does your wound impact your activities of daily livingo Sleep: Yes Electronic Signature(s) Signed: 11/06/2021 5:01:06 PM By: Adline Peals Entered By: Adline Peals on 11/06/2021 07:50:53 -------------------------------------------------------------------------------- Patient/Caregiver Education Details Patient Name: Date of Service: [name], [name] 7/19/2023andnbsp7:45 [name]  M Medical Record Number:  [id number] Patient Account Number: [id number] Date of Birth/Gender: Treating RN: 1932-08-25 (88 y.o. [name] Primary Care Physician: Dettinger, Vonna Kotyk Other Clinician: Referring Physician: Treating Physician/Extender: Fredirick Maudlin Dettinger, [name] in Treatment: 11 Education Assessment Education Provided To: Patient Education Topics Provided Wound/Skin Impairment: Methods: Explain/Verbal Responses: Reinforcements needed, State content correctly [employer]) Signed: 11/06/2021 5:01:06 PM By: Adline Peals Entered By: Adline Peals on 11/06/2021 07:55:53 -------------------------------------------------------------------------------- Wound Assessment Details Patient Name: Date of Service: [name], [name] 11/06/2021 7:45 [name] M Medical Record Number: [id number] Patient Account Number: [id number] Date of Birth/Sex: Treating RN: 02/16/1933 (88 y.o. [name] Primary Care Shondell Fabel: Dettinger, Vonna Kotyk Other Clinician: Referring Jinelle Butchko: Treating Renatta Shrieves/Extender: Fredirick Maudlin Dettinger, [name] in Treatment: 11 Wound Status Wound Number: 1 Primary Etiology: Pressure Ulcer Wound Location: Left, Medial Foot Wound Status: Open Wounding Event: Gradually Appeared Comorbid History: Cataracts, Hypertension, Rheumatoid Arthritis Date Acquired: 05/22/2021 Weeks Of Treatment: 11 Clustered Wound: No Photos Wound Measurements Length: (cm) 1.5 Width: (cm) 1.4 Depth: (cm) 0.1 Area: (cm) 1.649 Volume: (cm) 0.165 % Reduction in Area: 52.3% % Reduction in Volume: 52.3% Epithelialization: Small (1-33%) Tunneling: No Undermining: No Wound Description Classification: Category/Stage III Wound Margin: Well defined, not attached Exudate Amount: Medium Exudate Type: Serosanguineous Exudate Color: red, brown Foul Odor After Cleansing: No Slough/Fibrino Yes Wound Bed Granulation Amount: Medium (34-66%) Exposed  Structure Granulation Quality: Red Fascia Exposed: No Necrotic Amount: Medium (34-66%) Fat Layer (Subcutaneous Tissue) Exposed: Yes Necrotic Quality: Adherent Slough Tendon Exposed: No Muscle Exposed: No Joint Exposed: No Bone Exposed: No Treatment Notes Wound #1 (Foot) Wound Laterality: Left, Medial Cleanser Soap and Water Discharge Instruction: May shower and wash wound with dial antibacterial soap and water prior to dressing change. Wound Cleanser Discharge Instruction: Cleanse the wound with wound cleanser prior to applying [name] clean dressing using gauze sponges, not tissue or cotton balls. Peri-Wound Care Topical Primary Dressing Endoform 2x2 in Discharge Instruction: Moisten with saline Secondary Dressing Optifoam Non-Adhesive Dressing, 4x4 in Discharge Instruction: Apply over primary dressing as directed. Woven Gauze Sponges 2x2 in Discharge Instruction: Apply over primary dressing as directed. Secured With Elastic Bandage 4 inch (ACE bandage) Discharge Instruction: Secure with ACE bandage as directed. Kerlix Roll Sterile, 4.5x3.1 (in/yd) Discharge Instruction: Secure with Kerlix as directed. 53M Medipore Soft Cloth Surgical T 2x10 (in/yd) ape Discharge Instruction: Secure with tape as directed. Compression Wrap Compression Stockings Add-Ons Electronic Signature(s) Signed: 11/06/2021 5:01:06 PM By: Adline Peals Entered By: Adline Peals on 11/06/2021 08:13:51 -------------------------------------------------------------------------------- Vitals Details Patient Name: Date of Service: [name], [name] 11/06/2021 7:45 [name] M Medical Record Number: [id number] Patient Account Number: [id number] Date of Birth/Sex: Treating RN: 06-01-1932 (88 y.o. [name] Primary Care Ahmon Tosi: Dettinger, Vonna Kotyk Other Clinician: Referring Korine Winton: Treating Travelle Mcclimans/Extender: Fredirick Maudlin Dettinger, [name] in Treatment: 11 Vital Signs Time Taken:  07:49 Temperature (F): 97.7 Height (in): 60 Pulse (bpm): 71 Weight (lbs): 119 Respiratory Rate (breaths/min): 18 Body Mass Index (BMI): 23.2 Blood Pressure (mmHg): 144/73 Reference Range: 80 - 120 mg / dl Electronic Signature(s) Signed: 11/06/2021 5:01:06 PM By: Adline Peals Entered By: Adline Peals on 11/06/2021 07:49:27

## 2021-11-06 NOTE — Progress Notes (Signed)
[name], BIGGERS ([id number]) Visit Report for 11/06/2021 Chief Complaint Document Details Patient Name: Date of Service: A LV [name], [name] 11/06/2021 7:45 A M Medical Record Number: [id number] Patient Account Number: [id number] Date of Birth/Sex: Treating RN: 06-Jan-1933 (88 y.o. [name] Primary Care Provider: Dettinger, Vonna Kotyk Other Clinician: Referring Provider: Treating Provider/Extender: Fredirick Maudlin Dettinger, [name] in Treatment: 11 Information Obtained from: Patient Chief Complaint Patient is at the clinic for treatment of an open ulcer on her left medial first metatarsal head, secondary to friction and pressure. Electronic Signature(s) Signed: 11/06/2021 8:33:59 AM By: Fredirick Maudlin MD FACS Entered By: Fredirick Maudlin on 11/06/2021 08:33:59 -------------------------------------------------------------------------------- Debridement Details Patient Name: Date of Service: A LV [name], [name] 11/06/2021 7:45 A M Medical Record Number: [id number] Patient Account Number: [id number] Date of Birth/Sex: Treating RN: 1932/08/11 (88 y.o. [name] Primary Care Provider: Dettinger, Vonna Kotyk Other Clinician: Referring Provider: Treating Provider/Extender: Fredirick Maudlin Dettinger, [name] in Treatment: 11 Debridement Performed for Assessment: Wound #1 Left,Medial Foot Performed By: Physician Fredirick Maudlin, MD Debridement Type: Debridement Level of Consciousness (Pre-procedure): Awake and Alert Pre-procedure Verification/Time Out Yes - 08:03 Taken: Start Time: 08:03 Pain Control: Lidocaine 5% topical ointment T Area Debrided (L x W): otal 1.5 (cm) x 1.4 (cm) = 2.1 (cm) Tissue and other material debrided: Non-Viable, Slough, Slough Level: Non-Viable Tissue Debridement Description: Selective/Open Wound Instrument: Curette Bleeding: Minimum Hemostasis Achieved: Pressure Procedural Pain: 4 Post Procedural Pain: 1 Response to Treatment:  Procedure was tolerated well Level of Consciousness (Post- Awake and Alert procedure): Post Debridement Measurements of Total Wound Length: (cm) 1.5 Stage: Category/Stage III Width: (cm) 1.4 Depth: (cm) 0.1 Volume: (cm) 0.165 Character of Wound/Ulcer Post Debridement: Improved Post Procedure Diagnosis Same as Pre-procedure Electronic Signature(s) Signed: 11/06/2021 12:29:10 PM By: Fredirick Maudlin MD FACS Signed: 11/06/2021 5:01:06 PM By: Adline Peals Entered By: Adline Peals on 11/06/2021 08:06:11 -------------------------------------------------------------------------------- HPI Details Patient Name: Date of Service: A LV [name], [name] 11/06/2021 7:45 A M Medical Record Number: [id number] Patient Account Number: [id number] Date of Birth/Sex: Treating RN: 04/05/1933 (88 y.o. [name] Primary Care Provider: Dettinger, Vonna Kotyk Other Clinician: Referring Provider: Treating Provider/Extender: Fredirick Maudlin Dettinger, [name] in Treatment: 11 History of Present Illness HPI Description: ADMISSION 08/20/2021 This is an 88 year old Spanish-speaking patient who is being evaluated for an ulcer on the medial aspect of the left first metatarsal head. She says that in February, her shoes were rubbing against a bunion in this location and she first developed a blister that ultimately broke down to an ulcer. She is not a diabetic and she does not smoke. She does have some high blood pressure as well as rheumatoid arthritis. ABI in clinic was noncompressible. When the wound first appeared, she was seen in urgent care and was started on Bactrim. Her PCP followed up and recommended daily Xeroform gauze dressing changes. Initially, it sounds like the wound was quite painful but it is now quite improved. Apparently she had been changing the dressing twice a day at a minimum, and per her PCP's note she may be scrubbing it quite aggressively up to 4 times a day. Today, the  wound is clean, but there is perimeter of undermining. I am not entirely sure that I am probing bone but we are certainly very close to it. The surface is a little bit pale but otherwise healthy. No odor or concern for infection. 08/27/2021: The wound is about the same today. There is a little bit more undermining underneath the  callus that is formed around the perimeter. The wound surface is pink and moist. No odor or significant drainage. 09/04/2021: The wound is the same size today. It is very clean and the degree of undermining is about the same. The surface is a little bit hypertrophic. We have been using silver collagen and the patient's son reports that they have been making a concerted effort to tuck the dressing into the undermined area of tissue. No concern for infection. 09/11/2021: The wound is really unchanged with perhaps a little bit less undermining. It is clean and they have been using silver collagen, taking care to tuck it into the undermined area. I thought we had applied for snap VAC approval last week, but for some reason we are unable to find this. She does say that the degree of pain she is experiencing is less and that it sometimes just throbs at night but is better than previously. 09/18/2021: Last week, due to lack of progression with the wound, I took a culture that grew out skin flora but I also got an x-ray that was suggestive of osteomyelitis. She continues to complain of pain and throbbing at night but nothing throughout the day. She has been approved for snap VAC use with a $70- $100 co-pay with each application. The wound itself really looks no different today. 09/25/2021: I initiated doxycycline last week and she has been taking this. We also applied the snap VAC. The wound looks markedly better today with significant closure of the undermined portion. She continues to have some throbbing pain at night, but otherwise is doing well. We ordered an MRI to get a more  definitive answer regarding the possibility of osteomyelitis, but they have not yet received an appointment for this. 10/02/2021: She had her MRI just prior to coming to clinic so I do not yet have any results. She continues to have throbbing pain at night. The wound itself is a bit smaller. There is some periwound moisture with a little bit of slough but the undermined area is almost closed and I am unable to probe to bone. 10/10/2021: Unfortunately, the MRI done last week showed evidence of osteomyelitis. I renewed her doxycycline prescription for an additional 4 weeks. She has an appointment with infectious disease next week. Her primary care provider prescribed diclofenac which seems to be helping with her throbbing nighttime pain. The wound is about the same size today but there is no undermining present whatsoever. The surface is healthy with just a small amount of slough on the most proximal aspect of the wound. 10/16/2021: T oday, she is complaining that for the last 3 days, her foot has been more swollen and tender. She does have some visible swelling present and her foot feels warmer than normal. There is some erythema surrounding her wound and over the dorsum of her foot. She says that when she takes a step, the ball of her foot hurts more than it has. She has been taking doxycycline and seems to be tolerating this well. Her son also purchased some Pro-Stat and she has been taking this, although she does not particularly care for the taste. She has an appointment with infectious disease this morning after our visit. The wound itself is a little bit smaller. The periwound skin is slightly macerated but there is no undermining and there is granulation tissue on the surface. No odor or purulent drainage. 10/23/2021: She saw infectious disease last week and they initiated linezolid and levofloxacin. Unfortunately, this has resulted  in a lot of nausea and vomiting on the patient's part. She is not  taking either medication with any food and this seems to be part of the issue. Her overall oral intake remains quite poor, despite trying to get Pro-Stat and other agents into her system. She did start taking a probiotic but subsequently stopped taking the antibiotic so its not clear if this has had any beneficial effect or not. She is also scheduled to have formal ABIs performed in a couple of weeks, again per infectious disease. Her wound is a little bit smaller today and has a clean surface with just a little bit of biofilm and slough accumulation. The undermining and tunneling remain closed. 10/30/2021: Her son contacted infectious disease and they adjusted her antibiotic regimen. She is tolerating the new antibiotics without nausea or vomiting. She feels like the swelling and pain in her foot has diminished since initiating antibiotic therapy. The wound is nearly flush with the surrounding skin and has granulation tissue with just a little bit of slough. 11/06/2021: The snap VAC fell off last Friday. In the interim, they have been applying Prisma silver collagen to the site. The wound surface looks fairly robust and pink. There is a thin layer of slough on the wound surface. No undermining present. Swelling and warmth has improved. The patient continues to struggle with poor appetite; as result of not eating well, she is also having difficulty tolerating her oral antibiotic regimen. She is scheduled to have ABIs performed later this morning. Electronic Signature(s) Signed: 11/06/2021 8:36:17 AM By: Fredirick Maudlin MD FACS Entered By: Fredirick Maudlin on 11/06/2021 08:36:17 -------------------------------------------------------------------------------- Physical Exam Details Patient Name: Date of Service: A LV [name], Chakita 11/06/2021 7:45 A M Medical Record Number: [id number] Patient Account Number: [id number] Date of Birth/Sex: Treating RN: 1932-12-07 (88 y.o. [name] Primary Care  Provider: Dettinger, Vonna Kotyk Other Clinician: Referring Provider: Treating Provider/Extender: Fredirick Maudlin Dettinger, [name] in Treatment: 11 Constitutional Slightly hypertensive. . . . No acute distress.Marland Kitchen Respiratory Normal work of breathing on room air.. Notes 11/06/2021: The wound surface looks fairly robust and pink. There is a thin layer of slough on the wound surface. No undermining present. Swelling and warmth has improved. Electronic Signature(s) Signed: 11/06/2021 8:38:07 AM By: Fredirick Maudlin MD FACS Entered By: Fredirick Maudlin on 11/06/2021 08:38:07 -------------------------------------------------------------------------------- Physician Orders Details Patient Name: Date of Service: A LV [name], Anesha 11/06/2021 7:45 A M Medical Record Number: [id number] Patient Account Number: [id number] Date of Birth/Sex: Treating RN: 1933-01-28 (88 y.o. 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 - Dr. Celine Ahr - Room 2 - 8/1 at 8:30 AM Return A Bathing/ Shower/ Hygiene May shower with protection but do not get wound dressing(s) wet. Wound Treatment Wound #1 - Foot Wound Laterality: Left, Medial Cleanser: Soap and Water 1 x Per DGL/87 Days Discharge Instructions: May shower and wash wound with dial antibacterial soap and water prior to dressing change. Cleanser: Wound Cleanser 1 x Per Day/15 Days Discharge Instructions: Cleanse the wound with wound cleanser prior to applying a clean dressing using gauze sponges, not tissue or cotton balls. Prim Dressing: Endoform 2x2 in (DME) (Generic) 1 x Per Day/15  Days ary Discharge Instructions: Moisten with  saline Secondary Dressing: Optifoam Non-Adhesive Dressing, 4x4 in 1 x Per Day/15 Days Discharge Instructions: Apply over primary dressing as directed. Secondary Dressing: Woven Gauze Sponges 2x2 in 1 x Per Day/15 Days Discharge Instructions: Apply over primary dressing as directed. Secured With: Elastic Bandage 4 inch (ACE bandage) 1 x Per Day/15 Days Discharge Instructions: Secure with ACE bandage as directed. Secured With: [employer], 4.5x3.1 (in/yd) 1 x Per Day/15 Days Discharge Instructions: Secure with Kerlix as directed. Secured With: 70M Medipore Public affairs consultant Surgical T 2x10 (in/yd) 1 x Per Day/15 Days ape Discharge Instructions: Secure with tape as directed. Patient Medications llergies: penicillin G, lisinopril A Notifications Medication Indication Start End 11/06/2021 megestrol DOSE oral 800 mg/20 mL (20 mL) suspension - 20 mL PO daily Electronic Signature(s) Signed: 11/06/2021 8:42:15 AM By: Fredirick Maudlin MD FACS Entered By: Fredirick Maudlin on 11/06/2021 08:42:14 -------------------------------------------------------------------------------- Problem List Details Patient Name: Date of Service: A LV [name], [name] 11/06/2021 7:45 A M Medical Record Number: [id number] Patient Account Number: [id number] Date of Birth/Sex: Treating RN: [birth year] (88 y.o. 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 08/20/2021 No Yes without evidence of necrosis S90.812D Abrasion, left foot, subsequent encounter 11/06/2021 No Yes Inactive Problems Resolved Problems Electronic Signature(s) Signed: 11/06/2021 12:29:10 PM By: Fredirick Maudlin MD FACS Signed: 11/06/2021 5:01:06 PM By: Adline Peals Signed: 11/06/2021 5:01:06 PM By: Adline Peals Previous Signature: 11/06/2021 8:33:19 AM Version By: Fredirick Maudlin MD FACS Entered By: Adline Peals on 11/06/2021 11:39:48 -------------------------------------------------------------------------------- Progress Note Details Patient Name: Date of Service: A LV [name], Miette 11/06/2021 7:45 A M Medical Record Number: [id number] Patient Account Number: [id number] Date of Birth/Sex: Treating RN: [birth year] (88 y.o. [name] Primary Care Provider: Dettinger, Vonna Kotyk Other Clinician: Referring Provider: Treating Provider/Extender: Fredirick Maudlin Dettinger, [name] in Treatment: 11 Subjective Chief Complaint Information obtained from Patient Patient is at the clinic for treatment of an open ulcer on her left medial first metatarsal head, secondary to friction and pressure. History of Present Illness (HPI) ADMISSION 08/20/2021 This is an 88 year old Spanish-speaking patient who is being evaluated for an ulcer on the medial aspect of the left first metatarsal head. She says that in February, her shoes were rubbing against a bunion in this location and she first developed a blister that ultimately broke down to an ulcer. She is not a diabetic and she does not smoke. She does have some high blood pressure as well as rheumatoid arthritis. ABI in clinic was noncompressible. When the wound first appeared, she was seen in urgent care and was started on Bactrim. Her PCP followed up and recommended daily Xeroform gauze dressing changes. Initially, it sounds like the wound was quite painful but it is now quite improved. Apparently she had been changing the dressing twice a day at a minimum, and per her PCP's note she may be scrubbing it quite aggressively up to 4 times a day. Today, the wound is clean, but there is perimeter of undermining. I am not entirely  sure that I am probing bone but we are certainly very close to it. The surface is a little bit pale but otherwise healthy. No odor or concern for infection. 08/27/2021: The wound is about the same today. There is a little bit more undermining underneath the callus  that is formed around the perimeter. The wound surface is pink and moist. No odor or significant drainage. 09/04/2021: The wound is the same size today. It is very clean and the degree of undermining is about the same. The surface is a little bit hypertrophic. We have been using silver collagen and the patient's son reports that they have been making a concerted effort to tuck the dressing into the undermined area of tissue. No concern for infection. 09/11/2021: The wound is really unchanged with perhaps a little bit less undermining. It is clean and they have been using silver collagen, taking care to tuck it into the undermined area. I thought we had applied for snap VAC approval last week, but for some reason we are unable to find this. She does say that the degree of pain she is experiencing is less and that it sometimes just throbs at night but is better than previously. 09/18/2021: Last week, due to lack of progression with the wound, I took a culture that grew out skin flora but I also got an x-ray that was suggestive of osteomyelitis. She continues to complain of pain and throbbing at night but nothing throughout the day. She has been approved for snap VAC use with a $70- $100 co-pay with each application. The wound itself really looks no different today. 09/25/2021: I initiated doxycycline last week and she has been taking this. We also applied the snap VAC. The wound looks markedly better today with significant closure of the undermined portion. She continues to have some throbbing pain at night, but otherwise is doing well. We ordered an MRI to get a more definitive answer regarding the possibility of osteomyelitis, but they have not yet  received an appointment for this. 10/02/2021: She had her MRI just prior to coming to clinic so I do not yet have any results. She continues to have throbbing pain at night. The wound itself is a bit smaller. There is some periwound moisture with a little bit of slough but the undermined area is almost closed and I am unable to probe to bone. 10/10/2021: Unfortunately, the MRI done last week showed evidence of osteomyelitis. I renewed her doxycycline prescription for an additional 4 weeks. She has an appointment with infectious disease next week. Her primary care provider prescribed diclofenac which seems to be helping with her throbbing nighttime pain. The wound is about the same size today but there is no undermining present whatsoever. The surface is healthy with just a small amount of slough on the most proximal aspect of the wound. 10/16/2021: T oday, she is complaining that for the last 3 days, her foot has been more swollen and tender. She does have some visible swelling present and her foot feels warmer than normal. There is some erythema surrounding her wound and over the dorsum of her foot. She says that when she takes a step, the ball of her foot hurts more than it has. She has been taking doxycycline and seems to be tolerating this well. Her son also purchased some Pro-Stat and she has been taking this, although she does not particularly care for the taste. She has an appointment with infectious disease this morning after our visit. The wound itself is a little bit smaller. The periwound skin is slightly macerated but there is no undermining and there is granulation tissue on the surface. No odor or purulent drainage. 10/23/2021: She saw infectious disease last week and they initiated linezolid and levofloxacin. Unfortunately, this has resulted in  a lot of nausea and vomiting on the patient's part. She is not taking either medication with any food and this seems to be part of the issue. Her  overall oral intake remains quite poor, despite trying to get Pro-Stat and other agents into her system. She did start taking a probiotic but subsequently stopped taking the antibiotic so its not clear if this has had any beneficial effect or not. She is also scheduled to have formal ABIs performed in a couple of weeks, again per infectious disease. Her wound is a little bit smaller today and has a clean surface with just a little bit of biofilm and slough accumulation. The undermining and tunneling remain closed. 10/30/2021: Her son contacted infectious disease and they adjusted her antibiotic regimen. She is tolerating the new antibiotics without nausea or vomiting. She feels like the swelling and pain in her foot has diminished since initiating antibiotic therapy. The wound is nearly flush with the surrounding skin and has granulation tissue with just a little bit of slough. 11/06/2021: The snap VAC fell off last Friday. In the interim, they have been applying Prisma silver collagen to the site. The wound surface looks fairly robust and pink. There is a thin layer of slough on the wound surface. No undermining present. Swelling and warmth has improved. The patient continues to struggle with poor appetite; as result of not eating well, she is also having difficulty tolerating her oral antibiotic regimen. She is scheduled to have ABIs performed later this morning. Patient History Information obtained from Patient. Family History Cancer - Siblings, No family history of Diabetes, Heart Disease, Hereditary Spherocytosis, Hypertension, Kidney Disease, Lung Disease, Seizures, Stroke, Thyroid Problems, Tuberculosis. Social History Never smoker, Marital Status - Married, Alcohol Use - Never, Drug Use - No History, Caffeine Use - Daily. Medical History Eyes Patient has history of Cataracts Cardiovascular Patient has history of Hypertension Musculoskeletal Patient has history of Rheumatoid Arthritis -  hands and feet Hospitalization/Surgery History - cataract surgery. Medical A Surgical History Notes nd Eyes macular degeneration Ear/Nose/Mouth/Throat GERD, dysarthria Hematologic/Lymphatic dyslipidemia Cardiovascular palpitations Musculoskeletal osteoporosis, unsteady gait Neurologic TIA Objective Constitutional Slightly hypertensive. No acute distress.. Vitals Time Taken: 7:49 AM, Height: 60 in, Weight: 119 lbs, BMI: 23.2, Temperature: 97.7 F, Pulse: 71 bpm, Respiratory Rate: 18 breaths/min, Blood Pressure: 144/73 mmHg. Respiratory Normal work of breathing on room air.. General Notes: 11/06/2021: The wound surface looks fairly robust and pink. There is a thin layer of slough on the wound surface. No undermining present. Swelling and warmth has improved. Integumentary (Hair, Skin) Wound #1 status is Open. Original cause of wound was Gradually Appeared. The date acquired was: 05/22/2021. The wound has been in treatment 11 weeks. The wound is located on the Left,Medial Foot. The wound measures 1.5cm length x 1.4cm width x 0.1cm depth; 1.649cm^2 area and 0.165cm^3 volume. There is Fat Layer (Subcutaneous Tissue) exposed. There is no tunneling or undermining noted. There is a medium amount of serosanguineous drainage noted. The wound margin is well defined and not attached to the wound base. There is medium (34-66%) red granulation within the wound bed. There is a medium (34-66%) amount of necrotic tissue within the wound bed including Adherent Slough. Assessment Active Problems ICD-10 Non-pressure chronic ulcer of other part of left foot with bone involvement without evidence of necrosis Procedures Wound #1 Pre-procedure diagnosis of Wound #1 is a Pressure Ulcer located on the Left,Medial Foot . There was a Selective/Open Wound Non-Viable Tissue Debridement with a total area of  2.1 sq cm performed by Fredirick Maudlin, MD. With the following instrument(s): Curette to remove  Non-Viable tissue/material. Material removed includes [facility] after achieving pain control using Lidocaine 5% topical ointment. No specimens were taken. A time out was conducted at 08:03, prior to the start of the procedure. A Minimum amount of bleeding was controlled with Pressure. The procedure was tolerated well with a pain level of 4 throughout and a pain level of 1 following the procedure. Post Debridement Measurements: 1.5cm length x 1.4cm width x 0.1cm depth; 0.165cm^3 volume. Post debridement Stage noted as Category/Stage III. Character of Wound/Ulcer Post Debridement is improved. Post procedure Diagnosis Wound #1: Same as Pre-Procedure Plan Follow-up Appointments: Return Appointment in 2 weeks. - Dr. Celine Ahr - Room 2 - 8/1 at 8:30 AM Bathing/ Shower/ Hygiene: May shower with protection but do not get wound dressing(s) wet. The following medication(s) was prescribed: megestrol oral 800 mg/20 mL (20 mL) suspension 20 mL PO daily starting 11/06/2021 WOUND #1: - Foot Wound Laterality: Left, Medial Cleanser: Soap and Water 1 x Per Day/15 Days Discharge Instructions: May shower and wash wound with dial antibacterial soap and water prior to dressing change. Cleanser: Wound Cleanser 1 x Per Day/15 Days Discharge Instructions: Cleanse the wound with wound cleanser prior to applying a clean dressing using gauze sponges, not tissue or cotton balls. Prim Dressing: Endoform 2x2 in (DME) (Generic) 1 x Per Day/15 Days ary Discharge Instructions: Moisten with saline Secondary Dressing: Optifoam Non-Adhesive Dressing, 4x4 in 1 x Per Day/15 Days Discharge Instructions: Apply over primary dressing as directed. Secondary Dressing: Woven Gauze Sponges 2x2 in 1 x Per Day/15 Days Discharge Instructions: Apply over primary dressing as directed. Secured With: Elastic Bandage 4 inch (ACE bandage) 1 x Per Day/15 Days Discharge Instructions: Secure with ACE bandage as directed. Secured With: [employer], 4.5x3.1 (in/yd) 1 x Per Day/15 Days Discharge Instructions: Secure with Kerlix as directed. Secured With: 6M Medipore Public affairs consultant Surgical T 2x10 (in/yd) 1 x Per Day/15 Days ape Discharge Instructions: Secure with tape as directed. 11/06/2021: The wound surface looks fairly robust and pink. There is a thin layer of slough on the wound surface. No undermining present. Swelling and warmth has improved. I used a curette to debride the slough off of the wound surface. The patient expressed a desire to not have the snap VAC reapplied. I think we have accomplished our goals from that standpoint, basically eliminating the undermined portion of her wound so I think this is okay. We will use endoform and change the dressing every other day. She will have her vascular studies done today and if there is any intervention indicated, that we will likely help with her wound healing. For her poor appetite, I have prescribed megestrol, as I am certain she is not getting adequate nutrition and specifically inadequate protein for wound healing. She will follow-up in 2 weeks, per her request. Electronic Signature(s) Signed: 11/06/2021 8:43:45 AM By: Fredirick Maudlin MD FACS Entered By: Fredirick Maudlin on 11/06/2021 08:43:45 -------------------------------------------------------------------------------- HxROS Details Patient Name: Date of Service: A LV [name], [name] 11/06/2021 7:45 A M Medical Record Number: [id number] Patient Account Number: [id number] Date of Birth/Sex: Treating RN: [birth year] (88 y.o. [name] Primary Care Provider: Dettinger, Vonna Kotyk Other Clinician: Referring Provider: Treating Provider/Extender: Fredirick Maudlin Dettinger, [name] in Treatment: 11 Information Obtained From Patient Eyes Medical History: Positive for: Cataracts Past Medical History Notes: macular degeneration Ear/Nose/Mouth/Throat Medical History: Past Medical History Notes: GERD,  dysarthria Hematologic/Lymphatic Medical History:  Past Medical History Notes: dyslipidemia Cardiovascular Medical History: Positive for: Hypertension Past Medical History Notes: palpitations Musculoskeletal Medical History: Positive for: Rheumatoid Arthritis - hands and feet Past Medical History Notes: osteoporosis, unsteady gait Neurologic Medical History: Past Medical History Notes: TIA HBO Extended History Items Eyes: Cataracts Immunizations Pneumococcal Vaccine: Received Pneumococcal Vaccination: Yes Received Pneumococcal Vaccination On or After 60th Birthday: Yes Implantable Devices None Hospitalization / Surgery History Type of Hospitalization/Surgery cataract surgery Family and Social History Cancer: Yes - Siblings; Diabetes: No; Heart Disease: No; Hereditary Spherocytosis: No; Hypertension: No; Kidney Disease: No; Lung Disease: No; Seizures: No; Stroke: No; Thyroid Problems: No; Tuberculosis: No; Never smoker; Marital Status - Married; Alcohol Use: Never; Drug Use: No History; Caffeine Use: Daily; Financial Concerns: No; Food, Clothing or Shelter Needs: No; Support System Lacking: No; Transportation Concerns: No Electronic Signature(s) Signed: 11/06/2021 12:29:10 PM By: Fredirick Maudlin MD FACS Signed: 11/06/2021 5:01:06 PM By: Adline Peals Entered By: Fredirick Maudlin on 11/06/2021 08:37:32 -------------------------------------------------------------------------------- SuperBill Details Patient Name: Date of Service: A LV [name], Daryan 11/06/2021 Medical Record Number: [id number] Patient Account Number: [id number] Date of Birth/Sex: Treating RN: [birth year] (88 y.o. [name] Primary Care Provider: Dettinger, Vonna Kotyk Other Clinician: Referring Provider: Treating Provider/Extender: Fredirick Maudlin Dettinger, [name] in Treatment: 11 Diagnosis Coding ICD-10 Codes Code Description [phone] Non-pressure chronic ulcer of other part of left  foot with bone involvement without evidence of necrosis Facility Procedures CPT4 Code: [id number] Description: [id number] - DEBRIDE WOUND 1ST 20 SQ CM OR < ICD-10 Diagnosis Description L97.526 Non-pressure chronic ulcer of other part of left foot with bone involvement witho Modifier: ut evidence of necr Quantity: 1 osis Physician Procedures : CPT4 Code Description Modifier [id number] - WC PHYS LEVEL 4 - EST PT 25 ICD-10 Diagnosis Description L97.526 Non-pressure chronic ulcer of other part of left foot with bone involvement without evidence of necro Quantity: 1 sis : [id number] - WC PHYS DEBR WO ANESTH 20 SQ CM ICD-10 Diagnosis Description L97.526 Non-pressure chronic ulcer of other part of left foot with bone involvement without evidence of necro Quantity: 1 sis Electronic Signature(s) Signed: 11/06/2021 8:44:01 AM By: Fredirick Maudlin MD FACS Entered By: Fredirick Maudlin on 11/06/2021 08:44:01

## 2021-11-06 NOTE — Progress Notes (Signed)
ABI/TBI study completed. Please see CV Proc for preliminary results.  Yazan Gatling BS, RVT 11/06/2021 10:41 AM

## 2021-11-07 DIAGNOSIS — S91309D Unspecified open wound, unspecified foot, subsequent encounter: Secondary | ICD-10-CM | POA: Diagnosis not present

## 2021-11-13 ENCOUNTER — Ambulatory Visit (INDEPENDENT_AMBULATORY_CARE_PROVIDER_SITE_OTHER): Payer: Medicare Other | Admitting: Infectious Diseases

## 2021-11-13 ENCOUNTER — Other Ambulatory Visit: Payer: Self-pay

## 2021-11-13 ENCOUNTER — Encounter: Payer: Self-pay | Admitting: Infectious Diseases

## 2021-11-13 VITALS — BP 118/58 | HR 73 | Temp 98.3°F | Wt 123.0 lb

## 2021-11-13 DIAGNOSIS — I739 Peripheral vascular disease, unspecified: Secondary | ICD-10-CM | POA: Diagnosis not present

## 2021-11-13 DIAGNOSIS — Z789 Other specified health status: Secondary | ICD-10-CM | POA: Diagnosis not present

## 2021-11-13 DIAGNOSIS — M869 Osteomyelitis, unspecified: Secondary | ICD-10-CM | POA: Diagnosis not present

## 2021-11-13 DIAGNOSIS — Z5181 Encounter for therapeutic drug level monitoring: Secondary | ICD-10-CM | POA: Diagnosis not present

## 2021-11-13 MED ORDER — DOXYCYCLINE HYCLATE 100 MG PO TABS
100.0000 mg | ORAL_TABLET | Freq: Two times a day (BID) | ORAL | 0 refills | Status: DC
Start: 2021-11-13 — End: 2022-02-12

## 2021-11-13 MED ORDER — LEVOFLOXACIN 750 MG PO TABS: 750.0000 mg | ORAL_TABLET | Freq: Every day | ORAL | 0 refills | Status: AC

## 2021-11-13 NOTE — Progress Notes (Signed)
Patient Active Problem List   Diagnosis Date Noted   PAD (peripheral artery disease) ([place]) 11/13/2021   Osteomyelitis ([place]) 10/16/2021   Medication monitoring encounter 10/16/2021   Penicillin allergy 10/16/2021   Dyslipidemia 01/24/2021   Osteoporosis 07/25/2020   Rheumatoid arthritis involving multiple sites with positive rheumatoid factor ([place]) 05/10/2018   Rheumatoid arthritis involving both hands with positive rheumatoid factor ([place]) 05/10/2018   Rheumatoid arthritis involving both feet with positive rheumatoid factor ([place]) 05/10/2018   GERD (gastroesophageal reflux disease) 02/05/2018   Early stage nonexudative age-related macular degeneration of both eyes 08/06/2017   Thyroid nodule 02/21/2015   Dysarthria 02/19/2015   HTN (hypertension) 02/19/2015   History of TIA (transient ischemic attack) 02/19/2015   Impaired weight bearing    Current Outpatient Medications on File Prior to Visit  Medication Sig Dispense Refill   alendronate (FOSAMAX) 70 MG tablet TAKE 1 TABLET BY MOUTH EVERY 7 (SEVEN) DAYS. TAKE WITH A FULL GLASS OF WATER ON AN EMPTY STOMACH. 12 tablet 0   amLODipine (NORVASC) 5 MG tablet Take 1 tablet (5 mg total) by mouth daily. 90 tablet 3   atorvastatin (LIPITOR) 40 MG tablet TAKE 1 TABLET BY MOUTH EVERYDAY AT BEDTIME 90 tablet 1   bacitracin-polymyxin b (POLYSPORIN) ophthalmic ointment Place 1 application into both eyes 2 (two) times daily. apply to eye every 12 hours while awake 3.5 g 1   diclofenac (VOLTAREN) 75 MG EC tablet Take 1 tablet (75 mg total) by mouth 2 (two) times daily. 60 tablet 2   loratadine (CLARITIN) 10 MG tablet Take 1 tablet (10 mg total) by mouth daily. 90 tablet 3   Pollen Extracts (PROSTAT PO) Take by mouth. Liquid prostat     No current facility-administered medications on file prior to visit.    Subjective: 88 Y O Female with h/o RA, Osteoporosis, HTN, Dyslipidemia who is referred from wound care from evaluation and management of  osteomyelitis. Patient is accompanied by her son who is helping with interpretation and they declined interpreter service.   She started having blister in the medial aspect of left great toe sometime in Feb when her shoes were rubbing against a bunion in this location that ultimately broke down to an ulcer. Seen by UC and was prescribed a course of PO bactrim. Her PCP followed up and recommended daily Xeroform gauze dressing changes. She started following up with wound care early May. 5/31 wound cx was done due to lack of progression of wound which grew out skin flora. 6/7 was started on doxycyline with an Xray 5/24: Soft tissue defect along the medial aspect of the first metatarsophalangeal joint with bony erosions along the medial head of the first metatarsal, concerning for osteomyelitis.  MRI left foot 6/15 Medial forefoot soft tissue ulcer with underlying osteomyelitis of the adjacent first metatarsal head. Adjacent soft tissue swelling without well-defined/drainable abscess on noncontrast MRI.  She was refilled another 4 weeks course of doxycyline on fu appt on 6/22 which she has been currently taking ( 3rd week) after MRI showed osteomyelitis. She has a small vac in her wound which gets changed weekly with minimal serous drainage and seems to be clinically improving per son/patient.   Denies fevers, chills, sweats Denies nausea, vomiting and diarrhea Denies cough, chest pain and SOB.  Denies rashes or GU symptoms  Not on medications for RA. She does not know who diagnosed her with RA Denies h/o DM  H/o penicillin allergy, when she got an injection  with penicillin many years ago when she was pregnant and had hives. No face/lip swelling or SOB.  Denies smoking, alcohol and IVDU  11/13/21 Here for follow up Left great toe medial ulcer/osteomyelitis. Accompanied by her son who is helping with interpretation. Was taking linezolid and levofloxacin after last clinic visit. However, Linezolid  had to be switched to doxycyline on 7/5 due to persistent nausea and vomiting. Lost around 10 lbs due to poor po intake that time. Nausea and vomiting seems to have improved since switching to doxycycline and she was also started on megestrol by wound care. Per son, she has gained 5 lbs back. Appetite is OK. Denies nausea, vomiting and diarrhea. Last seen by wound care 7/19 and wound is thought to be healing well. She does not have snap VAC anymore. Discussed lab work as well Vascular US of Lower extremities with concerns of vascular disease. They agree to be seen by Vascular surgery.   Review of Systems: all systems reviewed with pertinent positives and negatives as listed above  Past Medical History:  Diagnosis Date   Arthritis of right foot    Hx: UTI (urinary tract infection)    Hypertension    Palpitations    Unsteady gait    Past Surgical History:  Procedure Laterality Date   CATARACT EXTRACTION W/PHACO Right 01/01/2016   Procedure: CATARACT EXTRACTION PHACO AND INTRAOCULAR LENS PLACEMENT RIGHT EYE;  Surgeon: Rutherford Guys, MD;  Location: [facility];  Service: Ophthalmology;  Laterality: Right;  CDE: 9.69   CATARACT EXTRACTION W/PHACO Left 01/29/2016   Procedure: CATARACT EXTRACTION PHACO AND INTRAOCULAR LENS PLACEMENT (IOC);  Surgeon: Rutherford Guys, MD;  Location: [facility];  Service: Ophthalmology;  Laterality: Left;  CDE: 9.32   None      Social History   Tobacco Use   Smoking status: Never   Smokeless tobacco: Never  Vaping Use   Vaping Use: Never used  Substance Use Topics   Alcohol use: No    Alcohol/week: 0.0 standard drinks of alcohol   Drug use: Never    Family History  Problem Relation Age of Onset   Heart attack Mother 67       Died in [place]   Healthy Daughter    Healthy Son    Healthy Son    Healthy Daughter    Allergic rhinitis Neg Hx    Angioedema Neg Hx    Asthma Neg Hx    Eczema Neg Hx    Immunodeficiency Neg Hx    Urticaria Neg Hx     Allergies   Allergen Reactions   Penicillin G Hives   Lisinopril Swelling    Tongue swelling    Health Maintenance  Topic Date Due   URINE MICROALBUMIN  Never done   Zoster Vaccines- Shingrix (1 of 2) Never done   COVID-19 Vaccine (4 - Moderna series) 06/20/2020   Pneumonia Vaccine 8+ Years old (2 - PPSV23 or PCV20) 07/25/2021   INFLUENZA VACCINE  11/19/2021   DEXA SCAN  01/22/2022   TETANUS/TDAP  01/17/2027   HPV VACCINES  Aged Out    Objective: BP (!) 118/58   Pulse 73   Temp 98.3 F (36.8 C) (Oral)   Wt 123 lb (55.8 kg)   BMI 25.71 kg/m     Physical Exam Constitutional:      Appearance: Normal appearance.  HENT:     Head: Normocephalic and atraumatic.      Mouth: Mucous membranes are moist.  Eyes:    Conjunctiva/sclera: Conjunctivae  normal.     Pupils:   Cardiovascular:     Rate and Rhythm: Normal rate and regular rhythm.     Heart sounds:  Pulmonary:     Effort: Pulmonary effort is normal.     Breath sounds: Normal breath sounds.   Abdominal:     General: Non distended     Palpations: soft.   Musculoskeletal:        General: Normal range of motion.  Left Foot   Skin:    General: Skin is warm and dry.     Comments:  Neurological:     General: grossly non focal     Mental Status: awake, alert and oriented to person, place, and time.   Psychiatric:        Mood and Affect: Mood normal.   Lab Results Lab Results  Component Value Date   WBC 7.0 11/06/2021   HGB 12.5 11/06/2021   HCT 37.6 11/06/2021   MCV 88.7 11/06/2021   PLT 348 11/06/2021    Lab Results  Component Value Date   CREATININE 0.84 10/16/2021   BUN 30 (H) 10/16/2021   NA 136 10/16/2021   K 4.3 10/16/2021   CL 104 10/16/2021   CO2 25 10/16/2021    Lab Results  Component Value Date   ALT 10 01/24/2021   AST 20 01/24/2021   ALKPHOS 75 01/24/2021   BILITOT 0.3 01/24/2021    Lab Results  Component Value Date   CHOL 287 (H) 01/24/2021   HDL 35 (L) 01/24/2021   LDLCALC 204 (H)  01/24/2021   TRIG 242 (H) 01/24/2021   CHOLHDL 8.2 (H) 01/24/2021   Lab Results  Component Value Date   LABRPR Non Reactive 06/27/2015   No results found for: "HIV1RNAQUANT", "HIV1RNAVL", "CD4TABS"   Microbiology  Results for orders placed or performed during the hospital encounter of 02/21/15  MRSA PCR Screening     Status: None   Collection Time: 02/22/15 12:37 AM   Specimen: Nasal Mucosa; Nasopharyngeal  Result Value Ref Range Status   MRSA by PCR NEGATIVE NEGATIVE Final    Comment:        The GeneXpert MRSA Assay (FDA approved for NASAL specimens only), is one component of a comprehensive MRSA colonization surveillance program. It is not intended to diagnose MRSA infection nor to guide or monitor treatment for MRSA infections.     Imaging VAS [place] ABI WITH/WO TBI  Result Date: 11/06/2021  LOWER EXTREMITY DOPPLER STUDY Patient Name:  ANICIA LEUTHOLD  Date of Exam:   11/06/2021 Medical Rec #: [id number]      Accession #:    [id number] Date of Birth: 06-09-1932     Patient Gender: F Patient Age:   46 years Exam Location:  [facility] Procedure:      VAS [place] ABI WITH/WO TBI Referring Phys: Rosiland Oz --------------------------------------------------------------------------------  Indications: Ulceration. High Risk Factors: Hypertension. Other Factors: TIA.  Performing Technologist: Bobetta Lime BS, RVT  Examination Guidelines: A complete evaluation includes at minimum, Doppler waveform signals and systolic blood pressure reading at the level of bilateral brachial, anterior tibial, and posterior tibial arteries, when vessel segments are accessible. Bilateral testing is considered an integral part of a complete examination. Photoelectric Plethysmograph (PPG) waveforms and toe systolic pressure readings are included as required and additional duplex testing as needed. Limited examinations for reoccurring indications may be performed as noted.  ABI Findings:  +--------+------------------+-----+----------------------------------+--------+ Right   Rt Pressure (mmHg)IndexWaveform  Comment  +--------+------------------+-----+----------------------------------+--------+ [id number]                    triphasic                                  +--------+------------------+-----+----------------------------------+--------+ PTA     128               0.90 Monophasic with preserved upstroke         +--------+------------------+-----+----------------------------------+--------+ DP      176               1.24 monophasic                                 +--------+------------------+-----+----------------------------------+--------+ +--------+------------------+-----+----------+-------+ Left    Lt Pressure (mmHg)IndexWaveform  Comment +--------+------------------+-----+----------+-------+ [id number]                    triphasic         +--------+------------------+-----+----------+-------+ PTA     79                0.56 monophasic        +--------+------------------+-----+----------+-------+ DP      255               1.80 monophasic        +--------+------------------+-----+----------+-------+ +-------+-----------+-----------+------------+------------+ ABI/TBIToday's ABIToday's TBIPrevious ABIPrevious TBI +-------+-----------+-----------+------------+------------+ Right  1.24       0.34                                +-------+-----------+-----------+------------+------------+ Left   [place]         0.20                                +-------+-----------+-----------+------------+------------+  Summary: Right: Resting right ankle-brachial index is within normal range. No evidence of significant right lower extremity arterial disease. The right toe-brachial index is abnormal. Although ankle brachial indices are within normal limits (0.95-1.29), arterial Doppler waveforms at the ankle suggest some  component of arterial occlusive disease. Left: Resting left ankle-brachial index indicates noncompressible left lower extremity arteries. The left toe-brachial index is abnormal. *See table(s) above for measurements and observations.  Electronically signed by Monica Martinez MD on 11/06/2021 at 1:00:06 PM.    Final     Imaging VAS [place] ABI WITH/WO TBI  Result Date: 11/06/2021  LOWER EXTREMITY DOPPLER STUDY Patient Name:  DYAMON SOSINSKI  Date of Exam:   11/06/2021 Medical Rec #: [id number]      Accession #:    [id number] Date of Birth: [birth year]     Patient Gender: F Patient Age:   39 years Exam Location:  [facility] Procedure:      VAS [place] ABI WITH/WO TBI Referring Phys: Rosiland Oz --------------------------------------------------------------------------------  Indications: Ulceration. High Risk Factors: Hypertension. Other Factors: TIA.  Performing Technologist: Bobetta Lime BS, RVT  Examination Guidelines: A complete evaluation includes at minimum, Doppler waveform signals and systolic blood pressure reading at the level of bilateral brachial, anterior tibial, and posterior tibial arteries, when vessel segments are accessible. Bilateral testing is considered an integral part of a complete examination. Photoelectric Plethysmograph (PPG) waveforms and toe systolic pressure readings are included as required and additional duplex testing as needed.  Limited examinations for reoccurring indications may be performed as noted.  ABI Findings: +--------+------------------+-----+----------------------------------+--------+ Right   Rt Pressure (mmHg)IndexWaveform                          Comment  +--------+------------------+-----+----------------------------------+--------+ [id number]                    triphasic                                  +--------+------------------+-----+----------------------------------+--------+ PTA     128               0.90 Monophasic with preserved upstroke          +--------+------------------+-----+----------------------------------+--------+ DP      176               1.24 monophasic                                 +--------+------------------+-----+----------------------------------+--------+ +--------+------------------+-----+----------+-------+ Left    Lt Pressure (mmHg)IndexWaveform  Comment +--------+------------------+-----+----------+-------+ [id number]                    triphasic         +--------+------------------+-----+----------+-------+ PTA     79                0.56 monophasic        +--------+------------------+-----+----------+-------+ DP      255               1.80 monophasic        +--------+------------------+-----+----------+-------+ +-------+-----------+-----------+------------+------------+ ABI/TBIToday's ABIToday's TBIPrevious ABIPrevious TBI +-------+-----------+-----------+------------+------------+ Right  1.24       0.34                                +-------+-----------+-----------+------------+------------+ Left   [place]         0.20                                +-------+-----------+-----------+------------+------------+  Summary: Right: Resting right ankle-brachial index is within normal range. No evidence of significant right lower extremity arterial disease. The right toe-brachial index is abnormal. Although ankle brachial indices are within normal limits (0.95-1.29), arterial Doppler waveforms at the ankle suggest some component of arterial occlusive disease. Left: Resting left ankle-brachial index indicates noncompressible left lower extremity arteries. The left toe-brachial index is abnormal. *See table(s) above for measurements and observations.  Electronically signed by Monica Martinez MD on 11/06/2021 at 1:00:06 PM.    Final      Problem List Items Addressed This Visit       Cardiovascular and Mediastinum   PAD (peripheral artery disease) ([place])     Musculoskeletal and  Integument   Osteomyelitis ([place])     Other   Medication monitoring encounter - Primary   Relevant Orders   Sedimentation rate   Basic metabolic panel   C-reactive protein   Assessment/Plan Left medial forefoot ulcer Osteomyelitis of left 1st metatarsal head  Continue doxycycline '100mg'$  po bid and levofloxacin 750 mg po daily for 2 weeks to complete 6 weeks. EOT 11/26/21 Fu in 3 weeks  Fu with wound care   Medication monitoring  Labs today EKG, qtc 392  PAD  Amb referral to Vascular surgery   I have personally spent 41 minutes involved in face-to-face and non-face-to-face activities for this patient on the day of the visit including counseling of the patient and coordination of care.   Wilber Oliphant, [place] [facility] 11/13/2021, 9:39 AM

## 2021-11-14 LAB — BASIC METABOLIC PANEL
BUN: 21 mg/dL (ref 7–25)
CO2: 24 mmol/L (ref 20–32)
Calcium: 8.4 mg/dL — ABNORMAL LOW (ref 8.6–10.4)
Chloride: 105 mmol/L (ref 98–110)
Creat: 0.86 mg/dL (ref 0.60–0.95)
Glucose, Bld: 84 mg/dL (ref 65–99)
Potassium: 3.9 mmol/L (ref 3.5–5.3)
Sodium: 137 mmol/L (ref 135–146)

## 2021-11-19 ENCOUNTER — Encounter (HOSPITAL_BASED_OUTPATIENT_CLINIC_OR_DEPARTMENT_OTHER): Payer: Medicare Other | Attending: General Surgery | Admitting: General Surgery

## 2021-11-19 DIAGNOSIS — M069 Rheumatoid arthritis, unspecified: Secondary | ICD-10-CM | POA: Diagnosis not present

## 2021-11-19 DIAGNOSIS — L97526 Non-pressure chronic ulcer of other part of left foot with bone involvement without evidence of necrosis: Secondary | ICD-10-CM | POA: Diagnosis not present

## 2021-11-19 DIAGNOSIS — X58XXXA Exposure to other specified factors, initial encounter: Secondary | ICD-10-CM | POA: Insufficient documentation

## 2021-11-19 DIAGNOSIS — Y939 Activity, unspecified: Secondary | ICD-10-CM | POA: Diagnosis not present

## 2021-11-19 DIAGNOSIS — I10 Essential (primary) hypertension: Secondary | ICD-10-CM | POA: Diagnosis not present

## 2021-11-19 DIAGNOSIS — L89890 Pressure ulcer of other site, unstageable: Secondary | ICD-10-CM | POA: Diagnosis not present

## 2021-11-19 DIAGNOSIS — S90812A Abrasion, left foot, initial encounter: Secondary | ICD-10-CM | POA: Insufficient documentation

## 2021-11-19 DIAGNOSIS — Z09 Encounter for follow-up examination after completed treatment for conditions other than malignant neoplasm: Secondary | ICD-10-CM | POA: Insufficient documentation

## 2021-11-19 NOTE — Progress Notes (Signed)
[name], [name] ([id number]) Visit Report for 11/19/2021 Arrival Information Details Patient Name: Date of Service: [name], [name] 11/19/2021 8:30 A M Medical Record Number: [id number] Patient Account Number: [id number] Date of Birth/Sex: Treating RN: [birth year] (88 y.o. [name] Primary Care [name]: Dettinger, Vonna Kotyk Other Clinician: Referring Giara Mcgaughey: Treating Raelin Pixler/Extender: Fredirick Maudlin Dettinger, [name] in Treatment: 13 Visit Information History Since Last Visit Added or deleted any medications: No Patient Arrived: [name] Any new allergies or adverse reactions: No Arrival Time: 08:44 Had a fall or experienced change in No Accompanied By: son activities of daily living that may affect Transfer Assistance: None risk of falls: Patient Identification Verified: Yes Signs or symptoms of abuse/neglect since No Secondary Verification Process Completed: Yes last visito Patient Requires Transmission-Based Precautions: No Hospitalized since last visit: No Implantable device outside of the clinic No excluding cellular tissue based products placed in the center since last visit: Has Dressing in Place as Prescribed: Yes Has Footwear/Offloading in Place as Yes Prescribed: Right: Surgical Shoe with Pressure Relief Insole Pain Present Now: Yes Electronic Signature(s) Signed: 11/19/2021 5:53:45 PM By: Baruch Gouty RN, BSN Entered By: Baruch Gouty on 11/19/2021 08:48:26 -------------------------------------------------------------------------------- Encounter Discharge Information Details Patient Name: Date of Service: [name], [name] 11/19/2021 8:30 A M Medical Record Number: [id number] Patient Account Number: [id number] Date of Birth/Sex: Treating RN: 05/24/1932 (88 y.o. [name] Primary Care Cortne Amara: Dettinger, Vonna Kotyk Other Clinician: Referring Aneshia Jacquet: Treating [name]/Extender: Fredirick Maudlin Dettinger, [name] in Treatment:  13 Encounter Discharge Information Items Post Procedure Vitals Discharge Condition: Stable Temperature (F): 98.8 Ambulatory Status: [name] Pulse (bpm): 72 Discharge Destination: Home Respiratory Rate (breaths/min): 18 Transportation: Private Auto Blood Pressure (mmHg): 129/63 Accompanied By: son Schedule Follow-up Appointment: Yes Clinical Summary of Care: Patient Declined Electronic Signature(s) Signed: 11/19/2021 4:45:26 PM By: Adline Peals Entered By: Adline Peals on 11/19/2021 11:07:16 -------------------------------------------------------------------------------- Lower Extremity Assessment Details Patient Name: Date of Service: [name], [name] 11/19/2021 8:30 A M Medical Record Number: [id number] Patient Account Number: [id number] Date of Birth/Sex: Treating RN: [birth year] (88 y.o. [name] Primary Care [name]: Dettinger, Vonna Kotyk Other Clinician: Referring Araya Roel: Treating [name]/Extender: Fredirick Maudlin Dettinger, [name] in Treatment: 13 Edema Assessment Assessed: Shirlyn Goltz: No] Patrice Paradise: No] Edema: [Left: N] [Right: o] Calf Left: Right: Point of Measurement: 33 cm From Medial Instep 30.4 cm Ankle Left: Right: Point of Measurement: 9 cm From Medial Instep 22.9 cm Vascular Assessment Pulses: Dorsalis Pedis Palpable: [Left:No] Electronic Signature(s) Signed: 11/19/2021 5:53:45 PM By: Baruch Gouty RN, BSN Entered By: Baruch Gouty on 11/19/2021 08:55:58 -------------------------------------------------------------------------------- Multi Wound Chart Details Patient Name: Date of Service: [name], [name] 11/19/2021 8:30 A M Medical Record Number: [id number] Patient Account Number: [id number] Date of Birth/Sex: Treating RN: 11-22-1932 (88 y.o. F) Primary Care [name]: Dettinger, Vonna Kotyk Other Clinician: Referring Breahna Boylen: Treating Salote Weidmann/Extender: Fredirick Maudlin Dettinger, [name] in Treatment: 13 Vital  Signs Height(in): 60 Pulse(bpm): 72 Weight(lbs): 119 Blood Pressure(mmHg): 129/63 Body Mass Index(BMI): 23.2 Temperature(F): 98.8 Respiratory Rate(breaths/min): 18 Photos: [1:Left, Medial Foot] [N/A:N/A N/A] Wound Location: [1:Gradually Appeared] [N/A:N/A] Wounding Event: [1:Pressure Ulcer] [N/A:N/A] Primary Etiology: [1:Cataracts, Hypertension, Rheumatoid N/A] Comorbid History: [1:Arthritis 05/22/2021] [N/A:N/A] Date Acquired: [1:13] [N/A:N/A] Weeks of Treatment: [1:Open] [N/A:N/A] Wound Status: [1:No] [N/A:N/A] Wound Recurrence: [1:1.3x1.3x0.1] [N/A:N/A] Measurements L x W x D (cm) [1:1.327] [N/A:N/A] A (cm) : rea [1:0.133] [N/A:N/A] Volume (cm) : [1:61.60%] [N/A:N/A] % Reduction in A rea: [1:61.60%] [N/A:N/A] % Reduction in Volume: [1:Category/Stage III] [N/A:N/A] Classification: [1:Medium] [N/A:N/A] Exudate A  mount: [1:Serous] [N/A:N/A] Exudate Type: [1:amber] [N/A:N/A] Exudate Color: [1:Well defined, not attached] [N/A:N/A] Wound Margin: [1:Medium (34-66%)] [N/A:N/A] Granulation A mount: [1:Red] [N/A:N/A] Granulation Quality: [1:Medium (34-66%)] [N/A:N/A] Necrotic A mount: [1:Fat Layer (Subcutaneous Tissue): Yes N/A] Exposed Structures: [1:Fascia: No Tendon: No Muscle: No Joint: No Bone: No Small (1-33%)] [N/A:N/A] Epithelialization: [1:Cellular or Tissue Based Product] [N/A:N/A] Treatment Notes Electronic Signature(s) Signed: 11/19/2021 9:46:36 AM By: Fredirick Maudlin MD FACS Entered By: Fredirick Maudlin on 11/19/2021 09:46:36 -------------------------------------------------------------------------------- Multi-Disciplinary Care Plan Details Patient Name: Date of Service: [name], [name] 11/19/2021 8:30 A M Medical Record Number: [id number] Patient Account Number: [id number] Date of Birth/Sex: Treating RN: [birth year] (88 y.o. [name] Primary Care [name]: Dettinger, Vonna Kotyk Other Clinician: Referring [name]: Treating [name]/Extender: Fredirick Maudlin Dettinger, [name] in Treatment: 13 Active Inactive Abuse / Safety / Falls / Self Care Management Nursing Diagnoses: Impaired physical mobility Potential for falls Potential for injury related to falls Goals: Patient will remain injury free related to falls Date Initiated: 08/20/2021 Target Resolution Date: 12/20/2021 Goal Status: Active Patient/caregiver will demonstrate safe use of adaptive devices to increase mobility Date Initiated: 08/20/2021 Date Inactivated: 09/11/2021 Target Resolution Date: 10/11/2021 Goal Status: Met Patient/caregiver will verbalize understanding of skin care regimen Date Initiated: 08/20/2021 Date Inactivated: 09/11/2021 Target Resolution Date: 10/11/2021 Goal Status: Met Interventions: Assess fall risk on admission and as needed Assess impairment of mobility on admission and as needed per policy Provide education on fall prevention Notes: Wound/Skin Impairment Nursing Diagnoses: Impaired tissue integrity Knowledge deficit related to ulceration/compromised skin integrity Goals: Patient/caregiver will verbalize understanding of skin care regimen Date Initiated: 08/20/2021 Date Inactivated: 09/11/2021 Target Resolution Date: 09/10/2021 Goal Status: Met Ulcer/skin breakdown will have a volume reduction of 30% by week 4 Date Initiated: 08/20/2021 Target Resolution Date: 12/20/2021 Goal Status: Active Interventions: Assess patient/caregiver ability to perform ulcer/skin care regimen upon admission and as needed Assess ulceration(s) every visit Treatment Activities: Referred to DME Vanette Noguchi for dressing supplies : 08/20/2021 Skin care regimen initiated : 08/20/2021 Topical wound management initiated : 08/20/2021 Notes: Electronic Signature(s) Signed: 11/19/2021 4:45:26 PM By: Adline Peals Entered By: Adline Peals on 11/19/2021 09:15:15 -------------------------------------------------------------------------------- Pain Assessment  Details Patient Name: Date of Service: [name], Crissie 11/19/2021 8:30 A M Medical Record Number: [id number] Patient Account Number: [id number] Date of Birth/Sex: Treating RN: [birth year] (88 y.o. [name] Primary Care Marte Celani: Dettinger, Vonna Kotyk Other Clinician: Referring Shaday Rayborn: Treating Quenten Nawaz/Extender: Fredirick Maudlin Dettinger, [name] in Treatment: 13 Active Problems Location of Pain Severity and Description of Pain Patient Has Paino Yes Site Locations Pain Location: Pain in Ulcers With Dressing Change: Yes Duration of the Pain. Constant / Intermittento Intermittent Rate the pain. Current Pain Level: 1 Worst Pain Level: 8 Character of Pain Describe the Pain: Aching, Tender Pain Management and Medication Current Pain Management: Medication: Yes Rest: Yes How does your wound impact your activities of daily livingo Sleep: Yes Bathing: No Appetite: No Relationship With Others: No Bladder Continence: No Emotions: No Bowel Continence: No Work: No Toileting: No Drive: No Dressing: No Hobbies: No Notes reports increased pain in foot with pressure and walking Electronic Signature(s) Signed: 11/19/2021 5:53:45 PM By: Baruch Gouty RN, BSN Entered By: Baruch Gouty on 11/19/2021 08:50:23 -------------------------------------------------------------------------------- Patient/Caregiver Education Details Patient Name: Date of Service: [name], Virgilene 8/1/2023andnbsp8:30 A M Medical Record Number: [id number] Patient Account Number: [id number] Date of Birth/Gender: Treating RN: [birth year] (88 y.o. [name] Primary Care Physician: Dettinger, Vonna Kotyk Other Clinician: Referring Physician:  Treating Physician/Extender: Fredirick Maudlin Dettinger, [name] in Treatment: 13 Education Assessment Education Provided To: Patient Education Topics Provided Wound/Skin Impairment: Methods: Explain/Verbal Responses: Reinforcements  needed, State content correctly [employer]) Signed: 11/19/2021 4:45:26 PM By: Adline Peals Entered By: Adline Peals on 11/19/2021 09:15:37 -------------------------------------------------------------------------------- Wound Assessment Details Patient Name: Date of Service: [name], Natisha 11/19/2021 8:30 A M Medical Record Number: [id number] Patient Account Number: [id number] Date of Birth/Sex: Treating RN: 1932-09-10 (88 y.o. [name] Primary Care Adreana Coull: Dettinger, Vonna Kotyk Other Clinician: Referring Raylen Ken: Treating Korine Winton/Extender: Fredirick Maudlin Dettinger, [name] in Treatment: 13 Wound Status Wound Number: 1 Primary Etiology: Pressure Ulcer Wound Location: Left, Medial Foot Wound Status: Open Wounding Event: Gradually Appeared Comorbid History: Cataracts, Hypertension, Rheumatoid Arthritis Date Acquired: 05/22/2021 Weeks Of Treatment: 13 Clustered Wound: No Photos Wound Measurements Length: (cm) 1.3 Width: (cm) 1.3 Depth: (cm) 0.1 Area: (cm) 1.327 Volume: (cm) 0.133 % Reduction in Area: 61.6% % Reduction in Volume: 61.6% Epithelialization: Small (1-33%) Tunneling: No Undermining: No Wound Description Classification: Category/Stage III Wound Margin: Well defined, not attached Exudate Amount: Medium Exudate Type: Serous Exudate Color: amber Foul Odor After Cleansing: No Slough/Fibrino Yes Wound Bed Granulation Amount: Medium (34-66%) Exposed Structure Granulation Quality: Red Fascia Exposed: No Necrotic Amount: Medium (34-66%) Fat Layer (Subcutaneous Tissue) Exposed: Yes Necrotic Quality: Adherent Slough Tendon Exposed: No Muscle Exposed: No Joint Exposed: No Bone Exposed: No Treatment Notes Wound #1 (Foot) Wound Laterality: Left, Medial Cleanser Soap and Water Discharge Instruction: May shower and wash wound with dial antibacterial soap and water prior to dressing change. Wound Cleanser Discharge  Instruction: Cleanse the wound with wound cleanser prior to applying a clean dressing using gauze sponges, not tissue or cotton balls. Peri-Wound Care Topical Primary Dressing Secondary Dressing Optifoam Non-Adhesive Dressing, 4x4 in Discharge Instruction: Apply over primary dressing as directed. Woven Gauze Sponges 2x2 in Discharge Instruction: Apply over primary dressing as directed. Secured With Elastic Bandage 4 inch (ACE bandage) Discharge Instruction: Secure with ACE bandage as directed. Kerlix Roll Sterile, 4.5x3.1 (in/yd) Discharge Instruction: Secure with Kerlix as directed. 29M Medipore Soft Cloth Surgical T 2x10 (in/yd) ape Discharge Instruction: Secure with tape as directed. Compression Wrap Compression Stockings Add-Ons Electronic Signature(s) Signed: 11/19/2021 5:53:45 PM By: Baruch Gouty RN, BSN Entered By: Baruch Gouty on 11/19/2021 08:57:24 -------------------------------------------------------------------------------- Vitals Details Patient Name: Date of Service: [name], [place] 11/19/2021 8:30 A M Medical Record Number: [id number] Patient Account Number: [id number] Date of Birth/Sex: Treating RN: [birth year] (88 y.o. [name] Primary Care Camrie Stock: Dettinger, Vonna Kotyk Other Clinician: Referring Ashyia Schraeder: Treating Anissia Wessells/Extender: Fredirick Maudlin Dettinger, [name] in Treatment: 13 Vital Signs Time Taken: 08:48 Temperature (F): 98.8 Height (in): 60 Pulse (bpm): 72 Weight (lbs): 119 Respiratory Rate (breaths/min): 18 Body Mass Index (BMI): 23.2 Blood Pressure (mmHg): 129/63 Reference Range: 80 - 120 mg / dl Electronic Signature(s) Signed: 11/19/2021 5:53:45 PM By: Baruch Gouty RN, BSN Entered By: Baruch Gouty on 11/19/2021 08:48:54

## 2021-11-25 NOTE — Progress Notes (Signed)
KORENE, [name] ([id number]) Visit Report for 11/19/2021 Chief Complaint Document Details Patient Name: Date of Service: [name], [name] 11/19/2021 8:30 A M Medical Record Number: [id number] Patient Account Number: [id number] Date of Birth/Sex: Treating RN: 1932-05-16 (88 y.o. F) Primary Care Provider: Dettinger, Vonna Kotyk Other Clinician: Referring Provider: Treating Provider/Extender: Fredirick Maudlin Dettinger, [name] in Treatment: 13 Information Obtained from: Patient Chief Complaint Patient is at the clinic for treatment of an open ulcer on her left medial first metatarsal head, secondary to friction and pressure. Electronic Signature(s) Signed: 11/19/2021 9:46:43 AM By: Fredirick Maudlin MD FACS Entered By: Fredirick Maudlin on 11/19/2021 09:46:43 -------------------------------------------------------------------------------- Cellular or Tissue Based Product Details Patient Name: Date of Service: [name], [name] 11/19/2021 8:30 A M Medical Record Number: [id number] Patient Account Number: [id number] Date of Birth/Sex: Treating RN: 1933-03-30 (88 y.o. [name] Primary Care Provider: Dettinger, Vonna Kotyk Other Clinician: Referring Provider: Treating Provider/Extender: Fredirick Maudlin Dettinger, [name] in Treatment: 13 Cellular or Tissue Based Product Type Wound #1 Left,Medial Foot Applied to: Performed By: Physician Fredirick Maudlin, MD Cellular or Tissue Based Product Type: Grafix prime Level of Consciousness (Pre-procedure): Awake and Alert Pre-procedure Verification/Time Out Yes - 09:25 Taken: Location: genitalia / hands / feet / multiple digits Wound Size (sq cm): 1.69 Product Size (sq cm): 12 Waste Size (sq cm): 0 Amount of Product Applied (sq cm): 12 Instrument Used: Forceps, Scissors Lot #: [id number] Expiration Date: 04/06/2023 Reconstituted: Yes Solution Type: normal saline Solution Amount: 1 ml Lot #: [id number] Solution Expiration Date:  01/19/2022 Secured: Yes Secured With: Steri-Strips Dressing Applied: Yes Primary Dressing: adaptic Procedural Pain: 0 Post Procedural Pain: 0 Response to Treatment: Procedure was tolerated well Level of Consciousness (Post- Awake and Alert procedure): Post Procedure Diagnosis Same as Pre-procedure Electronic Signature(s) Signed: 11/19/2021 10:55:52 AM By: Fredirick Maudlin MD FACS Signed: 11/19/2021 4:45:26 PM By: Adline Peals Entered By: Adline Peals on 11/19/2021 09:28:16 -------------------------------------------------------------------------------- Debridement Details Patient Name: Date of Service: [name], [name] 11/19/2021 8:30 A M Medical Record Number: [id number] Patient Account Number: [id number] Date of Birth/Sex: Treating RN: 04-Aug-1932 (88 y.o. [name] Primary Care Provider: Dettinger, Vonna Kotyk Other Clinician: Referring Provider: Treating Provider/Extender: Fredirick Maudlin Dettinger, [name] in Treatment: 13 Debridement Performed for Assessment: Wound #1 Left,Medial Foot Performed By: Physician Fredirick Maudlin, MD Debridement Type: Debridement Level of Consciousness (Pre-procedure): Awake and Alert Pre-procedure Verification/Time Out Yes - 09:20 Taken: Start Time: 09:20 Pain Control: Lidocaine 4% T opical Solution T Area Debrided (L x W): otal 1.3 (cm) x 1.3 (cm) = 1.69 (cm) Tissue and other material debrided: Non-Viable, Slough, Slough Level: Non-Viable Tissue Debridement Description: Selective/Open Wound Instrument: Curette Bleeding: Minimum Hemostasis Achieved: Pressure Procedural Pain: 3 Post Procedural Pain: 0 Response to Treatment: Procedure was tolerated well Level of Consciousness (Post- Awake and Alert procedure): Post Debridement Measurements of Total Wound Length: (cm) 1.3 Stage: Category/Stage III Width: (cm) 1.3 Depth: (cm) 0.1 Volume: (cm) 0.133 Character of Wound/Ulcer Post Debridement: Improved Post  Procedure Diagnosis Same as Pre-procedure Electronic Signature(s) Signed: 11/19/2021 4:45:26 PM By: Adline Peals Signed: 11/25/2021 7:49:21 AM By: Fredirick Maudlin MD FACS Entered By: Adline Peals on 11/19/2021 16:38:51 -------------------------------------------------------------------------------- HPI Details Patient Name: Date of Service: [name], [name] 11/19/2021 8:30 A M Medical Record Number: [id number] Patient Account Number: [id number] Date of Birth/Sex: Treating RN: Feb 16, 1933 (88 y.o. F) Primary Care Provider: Dettinger, Vonna Kotyk Other Clinician: Referring Provider: Treating Provider/Extender: Fredirick Maudlin Dettinger, [name] in Treatment: 13 History of Present  Illness HPI Description: ADMISSION 08/20/2021 This is an 88 year old Spanish-speaking patient who is being evaluated for an ulcer on the medial aspect of the left first metatarsal head. She says that in February, her shoes were rubbing against a bunion in this location and she first developed a blister that ultimately broke down to an ulcer. She is not a diabetic and she does not smoke. She does have some high blood pressure as well as rheumatoid arthritis. ABI in clinic was noncompressible. When the wound first appeared, she was seen in urgent care and was started on Bactrim. Her PCP followed up and recommended daily Xeroform gauze dressing changes. Initially, it sounds like the wound was quite painful but it is now quite improved. Apparently she had been changing the dressing twice a day at a minimum, and per her PCP's note she may be scrubbing it quite aggressively up to 4 times a day. Today, the wound is clean, but there is perimeter of undermining. I am not entirely sure that I am probing bone but we are certainly very close to it. The surface is a little bit pale but otherwise healthy. No odor or concern for infection. 08/27/2021: The wound is about the same today. There is a little bit more undermining  underneath the callus that is formed around the perimeter. The wound surface is pink and moist. No odor or significant drainage. 09/04/2021: The wound is the same size today. It is very clean and the degree of undermining is about the same. The surface is a little bit hypertrophic. We have been using silver collagen and the patient's son reports that they have been making a concerted effort to tuck the dressing into the undermined area of tissue. No concern for infection. 09/11/2021: The wound is really unchanged with perhaps a little bit less undermining. It is clean and they have been using silver collagen, taking care to tuck it into the undermined area. I thought we had applied for snap VAC approval last week, but for some reason we are unable to find this. She does say that the degree of pain she is experiencing is less and that it sometimes just throbs at night but is better than previously. 09/18/2021: Last week, due to lack of progression with the wound, I took a culture that grew out skin [name] but I also got an x-ray that was suggestive of osteomyelitis. She continues to complain of pain and throbbing at night but nothing throughout the day. She has been approved for snap VAC use with a $70- $100 co-pay with each application. The wound itself really looks no different today. 09/25/2021: I initiated doxycycline last week and she has been taking this. We also applied the snap VAC. The wound looks markedly better today with significant closure of the undermined portion. She continues to have some throbbing pain at night, but otherwise is doing well. We ordered an MRI to get a more definitive answer regarding the possibility of osteomyelitis, but they have not yet received an appointment for this. 10/02/2021: She had her MRI just prior to coming to clinic so I do not yet have any results. She continues to have throbbing pain at night. The wound itself is a bit smaller. There is some periwound moisture  with a little bit of slough but the undermined area is almost closed and I am unable to probe to bone. 10/10/2021: Unfortunately, the MRI done last week showed evidence of osteomyelitis. I renewed her doxycycline prescription for an additional 4 weeks.  She has an appointment with infectious disease next week. Her primary care provider prescribed diclofenac which seems to be helping with her throbbing nighttime pain. The wound is about the same size today but there is no undermining present whatsoever. The surface is healthy with just a small amount of slough on the most proximal aspect of the wound. 10/16/2021: T oday, she is complaining that for the last 3 days, her foot has been more swollen and tender. She does have some visible swelling present and her foot feels warmer than normal. There is some erythema surrounding her wound and over the dorsum of her foot. She says that when she takes a step, the ball of her foot hurts more than it has. She has been taking doxycycline and seems to be tolerating this well. Her son also purchased some Pro-Stat and she has been taking this, although she does not particularly care for the taste. She has an appointment with infectious disease this morning after our visit. The wound itself is a little bit smaller. The periwound skin is slightly macerated but there is no undermining and there is granulation tissue on the surface. No odor or purulent drainage. 10/23/2021: She saw infectious disease last week and they initiated linezolid and levofloxacin. Unfortunately, this has resulted in a lot of nausea and vomiting on the patient's part. She is not taking either medication with any food and this seems to be part of the issue. Her overall oral intake remains quite poor, despite trying to get Pro-Stat and other agents into her system. She did start taking a probiotic but subsequently stopped taking the antibiotic so its not clear if this has had any beneficial effect or  not. She is also scheduled to have formal ABIs performed in a couple of weeks, again per infectious disease. Her wound is a little bit smaller today and has a clean surface with just a little bit of biofilm and slough accumulation. The undermining and tunneling remain closed. 10/30/2021: Her son contacted infectious disease and they adjusted her antibiotic regimen. She is tolerating the new antibiotics without nausea or vomiting. She feels like the swelling and pain in her foot has diminished since initiating antibiotic therapy. The wound is nearly flush with the surrounding skin and has granulation tissue with just a little bit of slough. 11/06/2021: The snap VAC fell off last Friday. In the interim, they have been applying Prisma silver collagen to the site. The wound surface looks fairly robust and pink. There is a thin layer of slough on the wound surface. No undermining present. Swelling and warmth has improved. The patient continues to struggle with poor appetite; as result of not eating well, she is also having difficulty tolerating her oral antibiotic regimen. She is scheduled to have ABIs performed later this morning. 11/19/2021: ABIs were done 2 weeks ago. Although she has normal ABIs, both TBI's were abnormal. 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 She saw infectious disease a week ago; she will complete 2 more weeks of antibiotics from the time of that visit. She has responded well to the Megace that I prescribed and her appetite has improved. We discontinued the wound VAC and have been applying endoform. T oday, the wound is markedly improved. There is good perimeter epithelialization and contracture of the wound. She has robust granulation tissue forming and a minimum of slough. 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  Electronic Signature(s) Signed: 11/19/2021 9:50:03 AM By: Fredirick Maudlin MD FACS Entered By: Fredirick Maudlin on 11/19/2021 09:50:03 -------------------------------------------------------------------------------- Physical Exam Details Patient Name: Date of Service: [name], [name] 11/19/2021 8:30 A M Medical Record Number: [id number] Patient Account Number: [id number] Date of Birth/Sex: Treating RN: 08-02-1932 (88 y.o. F) Primary Care Provider: Dettinger, Vonna Kotyk Other Clinician: Referring Provider: Treating Provider/Extender: Fredirick Maudlin Dettinger, [name] in Treatment: 13 Constitutional . . . . No acute distress.Marland Kitchen Respiratory Normal work of breathing on room air.. Notes 11/19/2021: Today, the wound is markedly improved. There is good perimeter epithelialization and contracture of the wound. She has robust granulation tissue forming and a minimum of slough. Electronic Signature(s) Signed: 11/19/2021 9:50:34 AM By: Fredirick Maudlin MD FACS Entered By: Fredirick Maudlin on 11/19/2021 09:50:34 -------------------------------------------------------------------------------- Physician Orders Details Patient Name: Date of Service: [name], Maryori 11/19/2021 8:30 A M Medical Record Number: [id number] Patient Account Number: [id number] Date of Birth/Sex: Treating RN: 05-26-1932 (88 y.o. 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 - Dr. Celine Ahr - Room 2 - 8/10 at 8:30 AM Return A Cellular or Tissue Based Products Wound #1 Left,Medial Foot daptic or Mepitel. (DO NOT REMOVE). - Cellular or Tissue Based Product applied to  wound bed, secured with steri-strips, cover with A Grafix Prime - DO NOT REMOVE Bathing/ Shower/ Hygiene May shower with protection but do not get wound dressing(s) wet. Wound Treatment Wound #1 - Foot Wound Laterality: Left, Medial Cleanser: Soap and Water 1 x Per Week/15 Days Discharge Instructions: May shower and wash wound with dial antibacterial soap and water prior to dressing change. Cleanser: Wound Cleanser 1 x Per Week/15 Days Discharge Instructions: Cleanse the wound with wound cleanser prior to applying a clean dressing using gauze sponges, not tissue or cotton balls. Secondary Dressing: Optifoam Non-Adhesive Dressing, 4x4 in 1 x Per Week/15 Days Discharge Instructions: Apply over primary dressing as directed. Secondary Dressing: Woven Gauze Sponges 2x2 in 1 x Per Week/15 Days Discharge Instructions: Apply over primary dressing as directed. Secured With: Elastic Bandage 4 inch (ACE bandage) 1 x Per Week/15 Days Discharge Instructions: Secure with ACE bandage as directed. Secured With: [employer], 4.5x3.1 (in/yd) 1 x Per Week/15 Days Discharge Instructions: Secure with Kerlix as directed. Secured With: 31M Medipore Public affairs consultant Surgical T 2x10 (in/yd) 1 x Per Week/15 Days ape Discharge Instructions: Secure with tape as directed. Electronic Signature(s) Signed: 11/19/2021 9:50:54 AM By: Fredirick Maudlin MD FACS Entered By: Fredirick Maudlin on 11/19/2021 09:50:53 -------------------------------------------------------------------------------- Problem List Details Patient Name: Date of Service: [name], Janari 11/19/2021 8:30 A M Medical Record Number: [id number] Patient Account Number: [id number] Date of Birth/Sex: Treating RN: Oct 07, 1932 (88 y.o. 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 F) Primary Care Provider: Dettinger, Vonna Kotyk Other Clinician: Referring Provider: Treating Provider/Extender: Fredirick Maudlin Dettinger, [name] in Treatment: 13 Subjective Chief Complaint Information obtained from Patient Patient is at the clinic for treatment of an open ulcer on her left medial first metatarsal head, secondary to friction and pressure. History of Present Illness (HPI) ADMISSION 08/20/2021 This is an 88 year old Spanish-speaking patient who is being evaluated for an ulcer on the medial aspect of the left first metatarsal head. She says that in February, her shoes were rubbing against a bunion in this location and she first developed a blister that ultimately broke down to an ulcer. She is not a diabetic and she does not smoke. She does have some high blood pressure as well as rheumatoid arthritis. ABI in clinic was noncompressible. When the wound first appeared, she was seen in urgent care and was started on Bactrim. Her PCP followed up and recommended daily Xeroform gauze dressing changes. Initially, it sounds like the wound was quite painful but it is now quite improved. Apparently she had been changing the dressing twice a day at a minimum, and per her PCP's note she may be scrubbing it  quite aggressively up to 4 times a day. Today, the wound is clean, but there is perimeter of undermining. I am not entirely sure that I am probing bone but we are certainly very close to it. The surface is a little bit pale but otherwise healthy. No odor or concern for infection. 08/27/2021: The wound is about the same today. There is a little bit more undermining underneath the callus that is formed around the perimeter. The wound surface is pink and moist. No odor or significant drainage. 09/04/2021: The wound is the same size today. It is very clean and the degree of undermining is about the same. The surface is a little bit hypertrophic. We have been using silver collagen and the patient's son reports that they have been making a concerted effort to tuck the dressing into the undermined area of tissue. No concern for infection. 09/11/2021: The wound is really unchanged with perhaps a little bit less undermining. It is clean and they have been using silver collagen, taking care to tuck it into the undermined area. I thought we had applied for snap VAC approval last week, but for some reason we are unable to find this. She does say that the degree of pain she is experiencing is less and that it sometimes just throbs at night but is better than previously. 09/18/2021: Last week, due to lack of progression with the wound, I took a culture that grew out skin [name] but I also got an x-ray that was suggestive of osteomyelitis. She continues to complain of pain and throbbing at night but nothing throughout the day. She has been approved for snap VAC use with a $70- $100 co-pay with each application. The wound itself really looks no different today. 09/25/2021: I initiated doxycycline last week and she has been taking this. We also applied the snap VAC. The wound looks markedly better today with significant closure of the undermined portion. She continues to have some throbbing pain at night, but otherwise is  doing well. We ordered an MRI to get a more definitive answer regarding the possibility of osteomyelitis, but they have not yet received  an appointment for this. 10/02/2021: She had her MRI just prior to coming to clinic so I do not yet have any results. She continues to have throbbing pain at night. The wound itself is a bit smaller. There is some periwound moisture with a little bit of slough but the undermined area is almost closed and I am unable to probe to bone. 10/10/2021: Unfortunately, the MRI done last week showed evidence of osteomyelitis. I renewed her doxycycline prescription for an additional 4 weeks. She has an appointment with infectious disease next week. Her primary care provider prescribed diclofenac which seems to be helping with her throbbing nighttime pain. The wound is about the same size today but there is no undermining present whatsoever. The surface is healthy with just a small amount of slough on the most proximal aspect of the wound. 10/16/2021: T oday, she is complaining that for the last 3 days, her foot has been more swollen and tender. She does have some visible swelling present and her foot feels warmer than normal. There is some erythema surrounding her wound and over the dorsum of her foot. She says that when she takes a step, the ball of her foot hurts more than it has. She has been taking doxycycline and seems to be tolerating this well. Her son also purchased some Pro-Stat and she has been taking this, although she does not particularly care for the taste. She has an appointment with infectious disease this morning after our visit. The wound itself is a little bit smaller. The periwound skin is slightly macerated but there is no undermining and there is granulation tissue on the surface. No odor or purulent drainage. 10/23/2021: She saw infectious disease last week and they initiated linezolid and levofloxacin. Unfortunately, this has resulted in a lot of nausea and  vomiting on the patient's part. She is not taking either medication with any food and this seems to be part of the issue. Her overall oral intake remains quite poor, despite trying to get Pro-Stat and other agents into her system. She did start taking a probiotic but subsequently stopped taking the antibiotic so its not clear if this has had any beneficial effect or not. She is also scheduled to have formal ABIs performed in a couple of weeks, again per infectious disease. Her wound is a little bit smaller today and has a clean surface with just a little bit of biofilm and slough accumulation. The undermining and tunneling remain closed. 10/30/2021: Her son contacted infectious disease and they adjusted her antibiotic regimen. She is tolerating the new antibiotics without nausea or vomiting. She feels like the swelling and pain in her foot has diminished since initiating antibiotic therapy. The wound is nearly flush with the surrounding skin and has granulation tissue with just a little bit of slough. 11/06/2021: The snap VAC fell off last Friday. In the interim, they have been applying Prisma silver collagen to the site. The wound surface looks fairly robust and pink. There is a thin layer of slough on the wound surface. No undermining present. Swelling and warmth has improved. The patient continues to struggle with poor appetite; as result of not eating well, she is also having difficulty tolerating her oral antibiotic regimen. She is scheduled to have ABIs performed later this morning. 11/19/2021: ABIs were done 2 weeks ago. Although she has normal ABIs, both TBI's were abnormal. There is also an indication that there is some component of arterial occlusive disease on the right based upon the  Doppler waveforms. She saw infectious disease a week ago; she will complete 2 more weeks of antibiotics from the time of that visit. She has responded well to the Megace that I prescribed and her appetite has  improved. We discontinued the wound VAC and have been applying endoform. T oday, the wound is markedly improved. There is good perimeter epithelialization and contracture of the wound. She has robust granulation tissue forming and a minimum of slough. 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 Patient History Information obtained from Patient. Family History Cancer - Siblings, No family history of Diabetes, Heart Disease, Hereditary Spherocytosis, Hypertension, Kidney Disease, Lung Disease, Seizures, Stroke, Thyroid Problems, Tuberculosis. Social History Never smoker, Marital Status - Married, Alcohol Use - Never, Drug Use - No History, Caffeine Use - Daily. Medical History Eyes Patient has history of Cataracts Cardiovascular Patient has history of Hypertension Musculoskeletal Patient has history of Rheumatoid Arthritis - hands and feet Hospitalization/Surgery History - cataract surgery. Medical A Surgical History Notes nd Eyes macular degeneration Ear/Nose/Mouth/Throat GERD, dysarthria Hematologic/Lymphatic dyslipidemia Cardiovascular palpitations Musculoskeletal osteoporosis, unsteady gait Neurologic TIA Objective Constitutional No acute distress.. Vitals Time Taken: 8:48 AM, Height: 60 in, Weight: 119 lbs, BMI: 23.2, Temperature: 98.8 F, Pulse: 72 bpm, Respiratory Rate: 18 breaths/min, Blood Pressure: 129/63 mmHg. Respiratory Normal work of breathing on room air.. General Notes: 11/19/2021: T oday, the wound is markedly improved. There is good perimeter epithelialization and contracture of the wound. She has robust granulation tissue forming and a minimum of slough. Integumentary (Hair, Skin) Wound #1 status is Open. Original cause of wound was Gradually Appeared. The date acquired was: 05/22/2021. The wound has been in treatment 13 weeks. The wound is located on the Left,Medial Foot. The wound measures 1.3cm length x 1.3cm width x 0.1cm depth; 1.327cm^2 area and  0.133cm^3 volume. There is Fat Layer (Subcutaneous Tissue) exposed. There is no tunneling or undermining noted. There is a medium amount of serous drainage noted. The wound margin is well defined and not attached to the wound base. There is medium (34-66%) red granulation within the wound bed. There is a medium (34-66%) amount of necrotic tissue within the wound bed including Adherent Slough. Assessment Active Problems ICD-10 Non-pressure chronic ulcer of other part of left foot with bone involvement without evidence of necrosis Abrasion, left foot, subsequent encounter Procedures Wound #1 Pre-procedure diagnosis of Wound #1 is a Pressure Ulcer located on the Left,Medial Foot . There was a Selective/Open Wound Non-Viable Tissue Debridement with a total area of 1.69 sq cm performed by Fredirick Maudlin, MD. With the following instrument(s): Curette to remove Non-Viable tissue/material. Material removed includes [facility] after achieving pain control using Lidocaine 4% Topical Solution. No specimens were taken. A time out was conducted at 09:20, prior to the start of the procedure. A Minimum amount of bleeding was controlled with Pressure. The procedure was tolerated well with a pain level of 3 throughout and a pain level of 0 following the procedure. Post Debridement Measurements: 1.3cm length x 1.3cm width x 0.1cm depth; 0.133cm^3 volume. Post debridement Stage noted as Category/Stage III. Character of Wound/Ulcer Post Debridement is improved. Post procedure Diagnosis Wound #1: Same as Pre-Procedure Pre-procedure diagnosis of Wound #1 is a Pressure Ulcer located on the Left,Medial Foot. A skin graft procedure using a bioengineered skin substitute/cellular or tissue based product was performed by Fredirick Maudlin, MD with the following instrument(s): Forceps and Scissors. Grafix prime was applied and secured with Steri-Strips. 12 sq cm of product was utilized and  0 sq cm was wasted. Post Application,  adaptic was applied. A Time Out was conducted at 09:25, prior to the start of the procedure. The procedure was tolerated well with a pain level of 0 throughout and a pain level of 0 following the procedure. Post procedure Diagnosis Wound #1: Same as Pre-Procedure . Plan Follow-up Appointments: Return Appointment in 1 week. - Dr. Celine Ahr - Room 2 - 8/10 at 8:30 AM Cellular or Tissue Based Products: Wound #1 Left,Medial Foot: Cellular or Tissue Based Product applied to wound bed, secured with steri-strips, cover with Adaptic or Mepitel. (DO NOT REMOVE). - Grafix Prime - DO NOT REMOVE Bathing/ Shower/ Hygiene: May shower with protection but do not get wound dressing(s) wet. WOUND #1: - Foot Wound Laterality: Left, Medial Cleanser: Soap and Water 1 x Per Week/15 Days Discharge Instructions: May shower and wash wound with dial antibacterial soap and water prior to dressing change. Cleanser: Wound Cleanser 1 x Per Week/15 Days Discharge Instructions: Cleanse the wound with wound cleanser prior to applying a clean dressing using gauze sponges, not tissue or cotton balls. Secondary Dressing: Optifoam Non-Adhesive Dressing, 4x4 in 1 x Per Week/15 Days Discharge Instructions: Apply over primary dressing as directed. Secondary Dressing: Woven Gauze Sponges 2x2 in 1 x Per Week/15 Days Discharge Instructions: Apply over primary dressing as directed. Secured With: Elastic Bandage 4 inch (ACE bandage) 1 x Per Week/15 Days Discharge Instructions: Secure with ACE bandage as directed. Secured With: [employer], 4.5x3.1 (in/yd) 1 x Per Week/15 Days Discharge Instructions: Secure with Kerlix as directed. Secured With: 78M Medipore Public affairs consultant Surgical T 2x10 (in/yd) 1 x Per Week/15 Days ape Discharge Instructions: Secure with tape as directed. 11/19/2021: Today, the wound is markedly improved. There is good perimeter epithelialization and contracture of the wound. She has robust granulation  tissue forming and a minimum of slough. I used a curette to debride the slough from the wound. We had a donated application of Grafix prime and I elected to use this on her. It was applied to the wound in standard prescribed fashion and secured with Adaptic and Steri-Strips. She was advised to leave this in place for the entire week. She will complete her course of oral antibiotics as prescribed by infectious disease. She will continue to try to augment her nutrition, aided by the Megace she has been taking. Follow-up in 1 week. Electronic Signature(s) Signed: 11/19/2021 4:45:26 PM By: Adline Peals Signed: 11/25/2021 7:49:21 AM By: Fredirick Maudlin MD FACS Previous Signature: 11/19/2021 9:52:22 AM Version By: Fredirick Maudlin MD FACS Previous Signature: 11/19/2021 9:51:14 AM Version By: Fredirick Maudlin MD FACS Entered By: Adline Peals on 11/19/2021 16:39:05 -------------------------------------------------------------------------------- HxROS Details Patient Name: Date of Service: [name], [name] 11/19/2021 8:30 A M Medical Record Number: [id number] Patient Account Number: [id number] Date of Birth/Sex: Treating RN: 22-Jun-1932 (88 y.o. F) Primary Care Provider: Dettinger, Vonna Kotyk Other Clinician: Referring Provider: Treating Provider/Extender: Fredirick Maudlin Dettinger, [name] in Treatment: 13 Information Obtained From Patient Eyes Medical History: Positive for: Cataracts Past Medical History Notes: macular degeneration Ear/Nose/Mouth/Throat Medical History: Past Medical History Notes: GERD, dysarthria Hematologic/Lymphatic Medical History: Past Medical History Notes: dyslipidemia Cardiovascular Medical History: Positive for: Hypertension Past Medical History Notes: palpitations Musculoskeletal Medical History: Positive for: Rheumatoid Arthritis - hands and feet Past Medical History Notes: osteoporosis, unsteady gait Neurologic Medical History: Past  Medical History Notes: TIA HBO Extended History Items Eyes: Cataracts Immunizations Pneumococcal Vaccine: Received Pneumococcal Vaccination: Yes Received Pneumococcal Vaccination On or After 60th  Birthday: Yes Implantable Devices None Hospitalization / Surgery History Type of Hospitalization/Surgery cataract surgery Family and Social History Cancer: Yes - Siblings; Diabetes: No; Heart Disease: No; Hereditary Spherocytosis: No; Hypertension: No; Kidney Disease: No; Lung Disease: No; Seizures: No; Stroke: No; Thyroid Problems: No; Tuberculosis: No; Never smoker; Marital Status - Married; Alcohol Use: Never; Drug Use: No History; Caffeine Use: Daily; Financial Concerns: No; Food, Clothing or Shelter Needs: No; Support System Lacking: No; Transportation Concerns: No Electronic Signature(s) Signed: 11/19/2021 10:55:52 AM By: Fredirick Maudlin MD FACS Entered By: Fredirick Maudlin on 11/19/2021 09:50:08 -------------------------------------------------------------------------------- SuperBill Details Patient Name: Date of Service: [name], [name] 11/19/2021 Medical Record Number: [id number] Patient Account Number: [id number] Date of Birth/Sex: Treating RN: 1933/01/17 (88 y.o. F) Primary Care Provider: Dettinger, Vonna Kotyk Other Clinician: Referring Provider: Treating Provider/Extender: Fredirick Maudlin Dettinger, [name] in Treatment: 13 Diagnosis Coding ICD-10 Codes Code Description [phone] Non-pressure chronic ulcer of other part of left foot with bone involvement without evidence of necrosis S90.812D Abrasion, left foot, subsequent encounter Facility Procedures CPT4 Code: [id number] Description: 40102 - SKIN SUB GRAFT FACE/NK/HF/G ICD-10 Diagnosis Description L97.526 Non-pressure chronic ulcer of other part of left foot with bone involvement with Modifier: out evidence of necr Quantity: 1 osis Physician Procedures : CPT4 Code Description Modifier [id number] [id number] - WC PHYS LEVEL 4  - EST PT 25 ICD-10 Diagnosis Description L97.526 Non-pressure chronic ulcer of other part of left foot with bone involvement without evidence of necro S90.812D Abrasion, left foot,  subsequent encounter Quantity: 1 sis : [id number] - WC PHYS SKIN SUB GRAFT FACE/NK/HF/G ICD-10 Diagnosis Description L97.526 Non-pressure chronic ulcer of other part of left foot with bone involvement without evidence of necro Quantity: 1 sis Notes evaluation of product Electronic Signature(s) Signed: 11/19/2021 4:45:26 PM By: Adline Peals Signed: 11/25/2021 7:49:21 AM By: Fredirick Maudlin MD FACS Previous Signature: 11/19/2021 9:53:55 AM Version By: Fredirick Maudlin MD FACS Entered By: Adline Peals on 11/19/2021 16:39:50

## 2021-11-28 ENCOUNTER — Encounter (HOSPITAL_BASED_OUTPATIENT_CLINIC_OR_DEPARTMENT_OTHER): Payer: Medicare Other | Admitting: General Surgery

## 2021-11-28 DIAGNOSIS — I10 Essential (primary) hypertension: Secondary | ICD-10-CM | POA: Diagnosis not present

## 2021-11-28 DIAGNOSIS — S90812A Abrasion, left foot, initial encounter: Secondary | ICD-10-CM | POA: Diagnosis not present

## 2021-11-28 DIAGNOSIS — L97526 Non-pressure chronic ulcer of other part of left foot with bone involvement without evidence of necrosis: Secondary | ICD-10-CM | POA: Diagnosis not present

## 2021-11-28 DIAGNOSIS — L89893 Pressure ulcer of other site, stage 3: Secondary | ICD-10-CM | POA: Diagnosis not present

## 2021-11-28 DIAGNOSIS — Z09 Encounter for follow-up examination after completed treatment for conditions other than malignant neoplasm: Secondary | ICD-10-CM | POA: Diagnosis not present

## 2021-11-28 DIAGNOSIS — M069 Rheumatoid arthritis, unspecified: Secondary | ICD-10-CM | POA: Diagnosis not present

## 2021-11-29 NOTE — Progress Notes (Signed)
[name], [name] ([id number]) Visit Report for 11/28/2021 Chief Complaint Document Details Patient Name: Date of Service: [name], [name] 11/28/2021 8:30 [name] M Medical Record Number: [id number] Patient Account Number: [id number] Date of Birth/Sex: Treating RN: 10-11-1932 (88 y.o. [name] Primary Care Provider: Dettinger, [name] Other Clinician: Referring Provider: Treating Provider/Extender: [name] Dettinger, [name] in Treatment: 14 Information Obtained from: Patient Chief Complaint Patient is at the clinic for treatment of an open ulcer on her left medial first metatarsal head, secondary to friction and pressure. Electronic Signature(s) Signed: 11/28/2021 9:38:34 AM By: [name] Maudlin MD FACS Entered By: [name] on 11/28/2021 09:38:34 -------------------------------------------------------------------------------- Cellular or Tissue Based Product Details Patient Name: Date of Service: [name], [name] 11/28/2021 8:30 [name] M Medical Record Number: [id number] Patient Account Number: [id number] Date of Birth/Sex: Treating RN: [birth year] (88 y.o. [name] Primary Care Provider: Dettinger, [name] Other Clinician: Referring Provider: Treating Provider/Extender: [name] Dettinger, [name] in Treatment: 14 Cellular or Tissue Based Product Type Wound #1 Left,Medial Foot Applied to: Performed By: Physician [name] Maudlin, MD Cellular or Tissue Based Product Type: Oasis wound matrix Level of Consciousness (Pre-procedure): Awake and Alert Pre-procedure Verification/Time Out Yes - 08:58 Taken: Location: genitalia / hands / feet / multiple digits Wound Size (sq cm): 0.8 Product Size (sq cm): 10.5 Waste Size (sq cm): 7.35 Amount of Product Applied (sq cm): 3.15 Instrument Used: Forceps, Scissors Lot #: [id number] Order #: [id number] Expiration Date: 03/24/2023 Fenestrated: Yes Reconstituted: Yes Solution Type: saline Solution  Amount: 50m Lot #: [id number] Expiration Date: 01/19/2022 Secured: Yes Secured With: Steri-Strips Dressing Applied: Yes Primary Dressing: adaptic Procedural Pain: 0 Post Procedural Pain: 0 Response to Treatment: Procedure was tolerated well Level of Consciousness (Post- Awake and Alert procedure): Post Procedure Diagnosis Same as Pre-procedure Electronic Signature(s) Signed: 11/28/2021 10:04:09 AM By: CFredirick MaudlinMD FACS Signed: 11/28/2021 4:41:51 PM By: PSharyn CreamerRN, BSN Entered By: PSharyn Creameron 11/28/2021 09:17:50 -------------------------------------------------------------------------------- Debridement Details Patient Name: Date of Service: [name], [name] 11/28/2021 8:30 [name] M Medical Record Number: [id number] Account Number: [id number] of Birth/Sex: Treating RN: [birth year](88y.o. FDonalda EwingsPrimary Care Provider: Dettinger, JVonna KotykOther Clinician: Referring Provider: Treating Provider/Extender: CFredirick MaudlinDettinger, JJory Simsin Treatment: 14 Debridement Performed for Assessment: Wound #1 Left,Medial Foot Performed By: Physician CFredirick Maudlin MD Debridement Type: Debridement Level of Consciousness (Pre-procedure): Awake and Alert Pre-procedure Verification/Time Out Yes - 08:48 Taken: Start Time: 08:51 Pain Control: Lidocaine 5% topical ointment T Area Debrided (L x W): otal 1 (cm) x 1 (cm) = 1 (cm) Tissue and other material debrided: Non-Viable, Eschar, Slough, Slough Level: Non-Viable Tissue Debridement Description: Selective/Open Wound Instrument: Curette Bleeding: Minimum Hemostasis Achieved: Pressure Procedural Pain: 0 Post Procedural Pain: 0 Response to Treatment: Procedure was tolerated well Level of Consciousness (Post- Awake and Alert procedure): Post Debridement Measurements of Total Wound Length: (cm) 1 Stage: Category/Stage III Width: (cm) 0.8 Depth: (cm) 0.1 Volume: (cm) 0.063 Character  of Wound/Ulcer Post Debridement: Improved Post Procedure Diagnosis Same as Pre-procedure Electronic Signature(s) Signed: 11/28/2021 10:04:09 AM By: CFredirick MaudlinMD FACS Signed: 11/28/2021 4:41:51 PM By: PSharyn CreamerRN, BSN Entered By: PSharyn Creameron 11/28/2021 08:53:42 -------------------------------------------------------------------------------- HPI Details Patient Name: Date of Service: [name], [name] 11/28/2021 8:30 [name] M Medical Record Number: [id number] Account Number: [id number] of Birth/Sex: Treating RN: [birth year](88y.o. FHarlow OhmsPrimary Care Provider: Dettinger, JVonna KotykOther Clinician: Referring Provider: Treating  Provider/Extender: [name] Dettinger, [name] in Treatment: 14 History of Present Illness HPI Description: ADMISSION 08/20/2021 This is an 88 year old Spanish-speaking patient who is being evaluated for an ulcer on the medial aspect of the left first metatarsal head. She says that in February, her shoes were rubbing against [name] bunion in this location and she first developed [name] blister that ultimately broke down to an ulcer. She is not [name] diabetic and she does not smoke. She does have some high blood pressure as well as rheumatoid arthritis. ABI in clinic was noncompressible. When the wound first appeared, she was seen in urgent care and was started on Bactrim. Her PCP followed up and recommended daily Xeroform gauze dressing changes. Initially, it sounds like the wound was quite painful but it is now quite improved. Apparently she had been changing the dressing twice [name] day at [name] minimum, and per her PCP's note she may be scrubbing it quite aggressively up to 4 times [name] day. Today, the wound is clean, but there is perimeter of undermining. I am not entirely sure that I am probing bone but we are certainly very close to it. The surface is [name] little bit pale but otherwise healthy. No odor or concern for infection. 08/27/2021: The  wound is about the same today. There is [name] little bit more undermining underneath the callus that is formed around the perimeter. The wound surface is pink and moist. No odor or significant drainage. 09/04/2021: The wound is the same size today. It is very clean and the degree of undermining is about the same. The surface is [name] little bit hypertrophic. We have been using silver collagen and the patient's son reports that they have been making [name] concerted effort to tuck the dressing into the undermined area of tissue. No concern for infection. 09/11/2021: The wound is really unchanged with perhaps [name] little bit less undermining. It is clean and they have been using silver collagen, taking care to tuck it into the undermined area. I thought we had applied for snap VAC approval last week, but for some reason we are unable to find this. She does say that the degree of pain she is experiencing is less and that it sometimes just throbs at night but is better than previously. 09/18/2021: Last week, due to lack of progression with the wound, I took [name] culture that grew out skin flora but I also got an x-ray that was suggestive of osteomyelitis. She continues to complain of pain and throbbing at night but nothing throughout the day. She has been approved for snap VAC use with [name] $70- $100 co-pay with each application. The wound itself really looks no different today. 09/25/2021: I initiated doxycycline last week and she has been taking this. We also applied the snap VAC. The wound looks markedly better today with significant closure of the undermined portion. She continues to have some throbbing pain at night, but otherwise is doing well. We ordered an MRI to get [name] more definitive answer regarding the possibility of osteomyelitis, but they have not yet received an appointment for this. 10/02/2021: She had her MRI just prior to coming to clinic so I do not yet have any results. She continues to have throbbing pain at  night. The wound itself is [name] bit smaller. There is some periwound moisture with [name] little bit of slough but the undermined area is almost closed and I am unable to probe to bone. 10/10/2021: Unfortunately, the MRI done last week showed evidence  of osteomyelitis. I renewed her doxycycline prescription for an additional 4 weeks. She has an appointment with infectious disease next week. Her primary care provider prescribed diclofenac which seems to be helping with her throbbing nighttime pain. The wound is about the same size today but there is no undermining present whatsoever. The surface is healthy with just [name] small amount of slough on the most proximal aspect of the wound. 10/16/2021: T oday, she is complaining that for the last 3 days, her foot has been more swollen and tender. She does have some visible swelling present and her foot feels warmer than normal. There is some erythema surrounding her wound and over the dorsum of her foot. She says that when she takes [name] step, the ball of her foot hurts more than it has. She has been taking doxycycline and seems to be tolerating this well. Her son also purchased some Pro-Stat and she has been taking this, although she does not particularly care for the taste. She has an appointment with infectious disease this morning after our visit. The wound itself is [name] little bit smaller. The periwound skin is slightly macerated but there is no undermining and there is granulation tissue on the surface. No odor or purulent drainage. 10/23/2021: She saw infectious disease last week and they initiated linezolid and levofloxacin. Unfortunately, this has resulted in [name] lot of nausea and vomiting on the patient's part. She is not taking either medication with any food and this seems to be part of the issue. Her overall oral intake remains quite poor, despite trying to get Pro-Stat and other agents into her system. She did start taking [name] probiotic but subsequently stopped taking  the antibiotic so its not clear if this has had any beneficial effect or not. She is also scheduled to have formal ABIs performed in [name] couple of weeks, again per infectious disease. Her wound is [name] little bit smaller today and has [name] clean surface with just [name] little bit of biofilm and slough accumulation. The undermining and tunneling remain closed. 10/30/2021: Her son contacted infectious disease and they adjusted her antibiotic regimen. She is tolerating the new antibiotics without nausea or vomiting. She feels like the swelling and pain in her foot has diminished since initiating antibiotic therapy. The wound is nearly flush with the surrounding skin and has granulation tissue with just [name] little bit of slough. 11/06/2021: The snap VAC fell off last Friday. In the interim, they have been applying Prisma silver collagen to the site. The wound surface looks fairly robust and pink. There is [name] thin layer of slough on the wound surface. No undermining present. Swelling and warmth has improved. The patient continues to struggle with poor appetite; as result of not eating well, she is also having difficulty tolerating her oral antibiotic regimen. She is scheduled to have ABIs performed later this morning. 11/19/2021: ABIs were done 2 weeks ago. Although she has normal ABIs, both TBI's were abnormal. 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 She saw infectious disease [name] week ago; she will complete 2 more weeks of antibiotics from the time of that visit. She has responded well to the Megace that I prescribed and her appetite has improved. We discontinued the wound VAC and have been applying endoform. T oday, the wound is markedly improved. There is good perimeter epithelialization and contracture of the wound. She has robust granulation tissue forming and [name]  minimum of slough. 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 11/28/2021: Last week we applied Grafix plus. T oday, although there is [name] hard layer of eschar overlying the wound, underneath this, it is smaller and has lovely robust granulation tissue. Bone is completely covered. She continues to complain of pulsatile pain at night but this seems to be managed fairly well with the meloxicam and Tylenol that she has been taking. Her appetite continues to be good on Megace. She will complete her oral antibiotics after today's doses. She has an appointment with vascular surgery to address her abnormal ABIs coming up on 23 August. She has received approval for Oasis. Electronic Signature(s) Signed: 11/28/2021 9:40:36 AM By: [name] Maudlin MD FACS Entered By: [name] on 11/28/2021 09:40:36 -------------------------------------------------------------------------------- Physical Exam Details Patient Name: Date of Service: [name], [name] 11/28/2021 8:30 [name] M Medical Record Number: [id number] Patient Account Number: [id number] Date of Birth/Sex: Treating RN: 1932/11/14 (88 y.o. [name] Primary Care Provider: Dettinger, [name] Other Clinician: Referring Provider: Treating Provider/Extender: [name] Dettinger, [name] in Treatment: 14 Constitutional . . . . No acute distress.Marland Kitchen Respiratory Normal work of breathing on room air.. Notes 11/28/2021: Today, although there is [name] hard layer of eschar overlying the wound, underneath this, it is smaller and has lovely robust granulation tissue. Bone is completely covered. Electronic Signature(s) Signed: 11/28/2021 9:41:24 AM By: [name] Maudlin MD FACS Entered By: [name] on 11/28/2021 09:41:23 -------------------------------------------------------------------------------- Physician Orders Details Patient Name: Date of Service: [name], [name] 11/28/2021 8:30 [name] M Medical Record Number: [id number] Patient Account Number: [id number] Date of Birth/Sex: Treating  RN: 1932-05-18 (88 y.o. 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 - Dr. Celine Ahr - Room 2 - Return [name] Anesthetic Wound #1 Left,Medial Foot Topical Lidocaine 5% added to wound bed Cellular or Tissue Based Products Wound #1 Left,Medial Foot daptic or Mepitel. (DO NOT REMOVE). - Oasis Cellular or Tissue Based Product applied to wound bed, secured with steri-strips, cover with [name] - DO NOT REMOVE Bathing/ Shower/ Hygiene May shower with protection but do not get wound dressing(s) wet. Wound Treatment Wound #1 - Foot Wound Laterality: Left, Medial Cleanser: Soap and Water 1 x Per Week/15 Days Discharge Instructions: May shower and wash wound with dial antibacterial soap and water prior to dressing change. Cleanser: Wound Cleanser 1 x Per Week/15 Days Discharge Instructions: Cleanse the wound with wound cleanser prior to applying [name] clean dressing using gauze sponges, not tissue or cotton balls. Prim Dressing: Oasis wound matrix ary 1 x Per Week/15 Days Secondary Dressing: Optifoam Non-Adhesive Dressing, 4x4 in 1 x Per Week/15 Days Discharge Instructions: Apply over primary dressing as directed. Secondary Dressing: Woven Gauze Sponges 2x2 in 1 x Per Week/15 Days Discharge Instructions: Apply over primary dressing as directed. Secured With: Elastic Bandage 4 inch (ACE bandage) 1 x Per Week/15 Days Discharge Instructions: Secure with ACE bandage as directed. Secured With: [employer], 4.5x3.1 (in/yd) 1 x Per Week/15 Days Discharge Instructions: Secure with Kerlix as  directed. Secured With: 69M Medipore Public affairs consultant Surgical T 2x10 (in/yd) 1 x Per Week/15 Days ape Discharge Instructions: Secure with tape as  directed. Electronic Signature(s) Signed: 11/28/2021 9:41:53 AM By: [name] Maudlin MD FACS Entered By: [name] on 11/28/2021 09:41:52 -------------------------------------------------------------------------------- Problem List Details Patient Name: Date of Service: [name], [name] 11/28/2021 8:30 [name] M Medical Record Number: [id number] Patient Account Number: [id number] Date of Birth/Sex: Treating RN: 03/15/1933 (88 y.o. 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 [name] Primary Care Provider: Dettinger, [name] Other Clinician: Referring Provider: Treating Provider/Extender: [name] Dettinger, [name] in Treatment: 14 Subjective Chief Complaint Information obtained from Patient Patient is at the clinic for treatment of an open ulcer on her left medial first metatarsal head, secondary to friction and pressure. History of Present  Illness (HPI) ADMISSION 08/20/2021 This is an 88 year old Spanish-speaking patient who is being evaluated for an ulcer on the medial aspect of the left first metatarsal head. She says that in February, her shoes were rubbing against [name] bunion in this location and she first developed [name] blister that ultimately broke down to an ulcer. She is not [name] diabetic and she does not smoke. She does have some high blood pressure as well as rheumatoid arthritis. ABI in clinic was noncompressible. When the wound first appeared, she was seen in urgent care and was started on Bactrim. Her PCP followed up and recommended daily Xeroform gauze dressing changes. Initially, it sounds like the wound was quite painful but it is now quite improved. Apparently she had been changing the dressing twice [name] day at [name] minimum, and per her PCP's note she may be scrubbing it quite aggressively up to 4 times [name] day. Today, the wound is clean, but there is perimeter of undermining. I am not entirely sure that I am probing bone but we are certainly very close to it. The surface is [name] little bit pale but otherwise healthy. No odor or concern for infection. 08/27/2021: The wound is about the same today. There is [name] little bit more undermining underneath the callus that is formed around the perimeter. The wound surface is pink and moist. No odor or significant drainage. 09/04/2021: The wound is the same size today. It is very clean and the degree of undermining is about the same. The surface is [name] little bit hypertrophic. We have been using silver collagen and the patient's son reports that they have been making [name] concerted effort to tuck the dressing into the undermined area of tissue. No concern for infection. 09/11/2021: The wound is really unchanged with perhaps [name] little bit less undermining. It is clean and they have been using silver collagen, taking care to tuck it into the undermined area. I thought we had applied for snap VAC approval last  week, but for some reason we are unable to find this. She does say that the degree of pain she is experiencing is less and that it sometimes just throbs at night but is  better than previously. 09/18/2021: Last week, due to lack of progression with the wound, I took [name] culture that grew out skin flora but I also got an x-ray that was suggestive of osteomyelitis. She continues to complain of pain and throbbing at night but nothing throughout the day. She has been approved for snap VAC use with [name] $70- $100 co-pay with each application. The wound itself really looks no different today. 09/25/2021: I initiated doxycycline last week and she has been taking this. We also applied the snap VAC. The wound looks markedly better today with significant closure of the undermined portion. She continues to have some throbbing pain at night, but otherwise is doing well. We ordered an MRI to get [name] more definitive answer regarding the possibility of osteomyelitis, but they have not yet received an appointment for this. 10/02/2021: She had her MRI just prior to coming to clinic so I do not yet have any results. She continues to have throbbing pain at night. The wound itself is [name] bit smaller. There is some periwound moisture with [name] little bit of slough but the undermined area is almost closed and I am unable to probe to bone. 10/10/2021: Unfortunately, the MRI done last week showed evidence of osteomyelitis. I renewed her doxycycline prescription for an additional 4 weeks. She has an appointment with infectious disease next week. Her primary care provider prescribed diclofenac which seems to be helping with her throbbing nighttime pain. The wound is about the same size today but there is no undermining present whatsoever. The surface is healthy with just [name] small amount of slough on the most proximal aspect of the wound. 10/16/2021: T oday, she is complaining that for the last 3 days, her foot has been more swollen and tender. She  does have some visible swelling present and her foot feels warmer than normal. There is some erythema surrounding her wound and over the dorsum of her foot. She says that when she takes [name] step, the ball of her foot hurts more than it has. She has been taking doxycycline and seems to be tolerating this well. Her son also purchased some Pro-Stat and she has been taking this, although she does not particularly care for the taste. She has an appointment with infectious disease this morning after our visit. The wound itself is [name] little bit smaller. The periwound skin is slightly macerated but there is no undermining and there is granulation tissue on the surface. No odor or purulent drainage. 10/23/2021: She saw infectious disease last week and they initiated linezolid and levofloxacin. Unfortunately, this has resulted in [name] lot of nausea and vomiting on the patient's part. She is not taking either medication with any food and this seems to be part of the issue. Her overall oral intake remains quite poor, despite trying to get Pro-Stat and other agents into her system. She did start taking [name] probiotic but subsequently stopped taking the antibiotic so its not clear if this has had any beneficial effect or not. She is also scheduled to have formal ABIs performed in [name] couple of weeks, again per infectious disease. Her wound is [name] little bit smaller today and has [name] clean surface with just [name] little bit of biofilm and slough accumulation. The undermining and tunneling remain closed. 10/30/2021: Her son contacted infectious disease and they adjusted her antibiotic regimen. She is tolerating the new antibiotics without nausea or vomiting. She feels like the swelling and pain in her foot has diminished since initiating antibiotic  therapy. The wound is nearly flush with the surrounding skin and has granulation tissue with just [name] little bit of slough. 11/06/2021: The snap VAC fell off last Friday. In the interim, they have  been applying Prisma silver collagen to the site. The wound surface looks fairly robust and pink. There is [name] thin layer of slough on the wound surface. No undermining present. Swelling and warmth has improved. The patient continues to struggle with poor appetite; as result of not eating well, she is also having difficulty tolerating her oral antibiotic regimen. She is scheduled to have ABIs performed later this morning. 11/19/2021: ABIs were done 2 weeks ago. Although she has normal ABIs, both TBI's were abnormal. 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 She saw infectious disease [name] week ago; she will complete 2 more weeks of antibiotics from the time of that visit. She has responded well to the Megace that I prescribed and her appetite has improved. We discontinued the wound VAC and have been applying endoform. T oday, the wound is markedly improved. There is good perimeter epithelialization and contracture of the wound. She has robust granulation tissue forming and [name] minimum of slough. 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 11/28/2021: Last week we applied Grafix plus. T oday, although there is [name] hard layer of eschar overlying the wound, underneath this, it is smaller and has lovely robust granulation tissue. Bone is completely covered. She continues to complain of pulsatile pain at night but this seems to be managed fairly well with the meloxicam and Tylenol that she has been taking. Her appetite continues to be good on Megace. She will complete her oral antibiotics after today's doses. She has an appointment with vascular surgery to address her abnormal ABIs coming up on 23 August. She has received approval for [facility]. Patient History Information obtained from Patient. Family History Cancer - Siblings, No family history of Diabetes, Heart Disease, Hereditary Spherocytosis, Hypertension, Kidney  Disease, Lung Disease, Seizures, Stroke, Thyroid Problems, Tuberculosis. Social History Never smoker, Marital Status - Married, Alcohol Use - Never, Drug Use - No History, Caffeine Use - Daily. Medical History Eyes Patient has history of Cataracts Cardiovascular Patient has history of Hypertension Musculoskeletal Patient has history of Rheumatoid Arthritis - hands and feet Hospitalization/Surgery History - cataract surgery. Medical [name] Surgical History Notes nd Eyes macular degeneration Ear/Nose/Mouth/Throat GERD, dysarthria Hematologic/Lymphatic dyslipidemia Cardiovascular palpitations Musculoskeletal osteoporosis, unsteady gait Neurologic TIA Objective Constitutional No acute distress.. Vitals Time Taken: 8:34 AM, Height: 60 in, Weight: 119 lbs, BMI: 23.2, Temperature: 98.3 F, Pulse: 82 bpm, Respiratory Rate: 18 breaths/min, Blood Pressure: 120/76 mmHg. Respiratory Normal work of breathing on room air.. General Notes: 11/28/2021: T oday, although there is [name] hard layer of eschar overlying the wound, underneath this, it is smaller and has lovely robust granulation tissue. Bone is completely covered. Integumentary (Hair, Skin) Wound #1 status is Open. Original cause of wound was Gradually Appeared. The date acquired was: 05/22/2021. The wound has been in treatment 14 weeks. The wound is located on the Left,Medial Foot. The wound measures 1cm length x 0.8cm width x 0.1cm depth; 0.628cm^2 area and 0.063cm^3 volume. There is Fat Layer (Subcutaneous Tissue) exposed. There is no tunneling or undermining noted. There is [name] medium amount of serous drainage noted. The wound margin is well defined and not attached to the wound base. There is medium (34-66%) red granulation within the wound bed.  There is [name] medium (34-66%) amount of necrotic tissue within the wound bed including Adherent Slough. Wound #1 status is Open. Original cause of wound was Gradually Appeared. The date acquired was:  05/22/2021. The wound has been in treatment 14 weeks. The wound is located on the Left,Medial Foot. The wound measures 1cm length x 0.8cm width x 0.1cm depth; 0.628cm^2 area and 0.063cm^3 volume. There is Fat Layer (Subcutaneous Tissue) exposed. There is no tunneling or undermining noted. There is [name] medium amount of serous drainage noted. The wound margin is well defined and not attached to the wound base. There is medium (34-66%) red granulation within the wound bed. There is [name] medium (34-66%) amount of necrotic tissue within the wound bed including Eschar and Adherent Slough. Assessment Active Problems ICD-10 Non-pressure chronic ulcer of other part of left foot with bone involvement without evidence of necrosis Abrasion, left foot, subsequent encounter Procedures Wound #1 Pre-procedure diagnosis of Wound #1 is [name] Pressure Ulcer located on the Left,Medial Foot . There was [name] Selective/Open Wound Non-Viable Tissue Debridement with [name] total area of 1 sq cm performed by [name] Maudlin, MD. With the following instrument(s): Curette to remove Non-Viable tissue/material. Material removed includes Eschar and Slough and after achieving pain control using Lidocaine 5% topical ointment. No specimens were taken. [name] time out was conducted at 08:48, prior to the start of the procedure. [name] Minimum amount of bleeding was controlled with Pressure. The procedure was tolerated well with [name] pain level of 0 throughout and [name] pain level of 0 following the procedure. Post Debridement Measurements: 1cm length x 0.8cm width x 0.1cm depth; 0.063cm^3 volume. Post debridement Stage noted as Category/Stage III. Character of Wound/Ulcer Post Debridement is improved. Post procedure Diagnosis Wound #1: Same as Pre-Procedure Pre-procedure diagnosis of Wound #1 is [name] Pressure Ulcer located on the Left,Medial Foot. [name] skin graft procedure using [name] bioengineered skin substitute/cellular or tissue based product was performed by [name] Maudlin, MD with the following instrument(s): Forceps and Scissors. [facility] wound matrix was applied and secured with Steri-Strips. 3.15 sq cm of product was utilized and 7.35 sq cm was wasted. Post Application, adaptic was applied. [name] Time Out was conducted at 08:58, prior to the start of the procedure. The procedure was tolerated well with [name] pain level of 0 throughout and [name] pain level of 0 following the procedure. Post procedure Diagnosis Wound #1: Same as Pre-Procedure . Plan Follow-up Appointments: Return Appointment in 1 week. - Dr. Celine Ahr - Room 2 - Anesthetic: Wound #1 Left,Medial Foot: Topical Lidocaine 5% added to wound bed Cellular or Tissue Based Products: Wound #1 Left,Medial Foot: Cellular or Tissue Based Product applied to wound bed, secured with steri-strips, cover with Adaptic or Mepitel. (DO NOT REMOVE). - [facility] - DO NOT REMOVE Bathing/ Shower/ Hygiene: May shower with protection but do not get wound dressing(s) wet. WOUND #1: - Foot Wound Laterality: Left, Medial Cleanser: Soap and Water 1 x Per Week/15 Days Discharge Instructions: May shower and wash wound with dial antibacterial soap and water prior to dressing change. Cleanser: Wound Cleanser 1 x Per Week/15 Days Discharge Instructions: Cleanse the wound with wound cleanser prior to applying [name] clean dressing using gauze sponges, not tissue or cotton balls. Prim Dressing: Oasis wound matrix 1 x Per Week/15 Days ary Secondary Dressing: Optifoam Non-Adhesive Dressing, 4x4 in 1 x Per Week/15 Days Discharge Instructions: Apply over primary dressing as directed. Secondary Dressing: Woven Gauze Sponges 2x2 in 1 x Per Week/15 Days Discharge Instructions: Apply  over primary dressing as directed. Secured With: Elastic Bandage 4 inch (ACE bandage) 1 x Per Week/15 Days Discharge Instructions: Secure with ACE bandage as directed. Secured With: [employer], 4.5x3.1 (in/yd) 1 x Per Week/15 Days Discharge Instructions:  Secure with Kerlix as directed. Secured With: 50M Medipore Public affairs consultant Surgical T 2x10 (in/yd) 1 x Per Week/15 Days ape Discharge Instructions: Secure with tape as directed. 11/28/2021: Today, although there is [name] hard layer of eschar overlying the wound, underneath this, it is smaller and has lovely robust granulation tissue. Bone is completely covered. I used [name] curette to debride the eschar from the wound. As she has been approved for [facility], I applied this in standard fashion, moistening it with saline. It was secured in place with Adaptic and Steri-Strips. [name] gauze bolster was applied and her foot wrapped. She will follow-up in 1 week. Electronic Signature(s) Signed: 11/28/2021 9:45:28 AM By: [name] Maudlin MD FACS Entered By: [name] on 11/28/2021 09:45:28 -------------------------------------------------------------------------------- HxROS Details Patient Name: Date of Service: [name], [name] 11/28/2021 8:30 [name] M Medical Record Number: [id number] Patient Account Number: [id number] Date of Birth/Sex: Treating RN: May 13, 1932 (88 y.o. [name] Primary Care Provider: Dettinger, [name] Other Clinician: Referring Provider: Treating Provider/Extender: [name] Dettinger, [name] in Treatment: 14 Information Obtained From Patient Eyes Medical History: Positive for: Cataracts Past Medical History Notes: macular degeneration Ear/Nose/Mouth/Throat Medical History: Past Medical History Notes: GERD, dysarthria Hematologic/Lymphatic Medical History: Past Medical History Notes: dyslipidemia Cardiovascular Medical History: Positive for: Hypertension Past Medical History Notes: palpitations Musculoskeletal Medical History: Positive for: Rheumatoid Arthritis - hands and feet Past Medical History Notes: osteoporosis, unsteady gait Neurologic Medical History: Past Medical History Notes: TIA HBO Extended History  Items Eyes: Cataracts Immunizations Pneumococcal Vaccine: Received Pneumococcal Vaccination: Yes Received Pneumococcal Vaccination On or After 60th Birthday: Yes Implantable Devices None Hospitalization / Surgery History Type of Hospitalization/Surgery cataract surgery Family and Social History Cancer: Yes - Siblings; Diabetes: No; Heart Disease: No; Hereditary Spherocytosis: No; Hypertension: No; Kidney Disease: No; Lung Disease: No; Seizures: No; Stroke: No; Thyroid Problems: No; Tuberculosis: No; Never smoker; Marital Status - Married; Alcohol Use: Never; Drug Use: No History; Caffeine Use: Daily; Financial Concerns: No; Food, Clothing or Shelter Needs: No; Support System Lacking: No; Transportation Concerns: No Electronic Signature(s) Signed: 11/28/2021 10:04:09 AM By: [name] Maudlin MD FACS Signed: 11/29/2021 4:07:48 PM By: Adline Peals Entered By: [name] on 11/28/2021 09:40:41 -------------------------------------------------------------------------------- SuperBill Details Patient Name: Date of Service: [name], Jamae 11/28/2021 Medical Record Number: [id number] Patient Account Number: [id number] Date of Birth/Sex: Treating RN: 12-04-1932 (88 y.o. [name] Primary Care Provider: Dettinger, [name] Other Clinician: Referring Provider: Treating Provider/Extender: [name] Dettinger, [name] in Treatment: 14 Diagnosis Coding ICD-10 Codes Code Description [phone] Non-pressure chronic ulcer of other part of left foot with bone involvement without evidence of necrosis S90.812D Abrasion, left foot, subsequent encounter Facility Procedures CPT4 Code: [id number] Description: D1761 -[facility] (Wound Matrix) per sq cm Modifier: Quantity: 10.5 CPT4 Code: [id number] Description: 26948 - SKIN SUB GRAFT FACE/NK/HF/G ICD-10 Diagnosis Description L97.526 Non-pressure chronic ulcer of other part of left foot with bone involvement with Modifier:  out evidence of necr Quantity: 1 osis Physician Procedures : CPT4 Code Description Modifier [id number] - WC PHYS LEVEL 4 - EST PT 25 ICD-10 Diagnosis Description L97.526 Non-pressure chronic ulcer of other part of left foot with bone involvement without evidence of necro S90.812D Abrasion, left foot,  subsequent encounter Quantity: 1 sis : [id number]  15275 - WC PHYS SKIN SUB GRAFT FACE/NK/HF/G ICD-10 Diagnosis Description L97.526 Non-pressure chronic ulcer of other part of left foot with bone involvement without evidence of necro Quantity: 1 sis Electronic Signature(s) Signed: 11/28/2021 9:45:47 AM By: [name] Maudlin MD FACS Entered By: [name] on 11/28/2021 09:45:46

## 2021-11-29 NOTE — Progress Notes (Signed)
AERABELLA, GALASSO ([id number]) Visit Report for 11/28/2021 Arrival Information Details Patient Name: Date of Service: [name], Neala 11/28/2021 8:30 A M Medical Record Number: [id number] Patient Account Number: [id number] Date of Birth/Sex: Treating RN: 1932-06-26 (88 y.o. [name] Primary Care Shakema Surita: Dettinger, Vonna Kotyk Other Clinician: Referring Natanel Snavely: Treating Kristinia Leavy/Extender: Fredirick Maudlin Dettinger, [name] in Treatment: 14 Visit Information History Since Last Visit Added or deleted any medications: No Patient Arrived: Ambulatory Any new allergies or adverse reactions: No Arrival Time: 08:34 Had a fall or experienced change in No Accompanied By: son activities of daily living that may affect Transfer Assistance: None risk of falls: Patient Identification Verified: Yes Signs or symptoms of abuse/neglect since last visito No Secondary Verification Process Completed: Yes Hospitalized since last visit: No Patient Requires Transmission-Based Precautions: No Implantable device outside of the clinic excluding No cellular tissue based products placed in the center since last visit: Has Dressing in Place as Prescribed: Yes Pain Present Now: Yes Electronic Signature(s) Signed: 11/28/2021 4:41:51 PM By: Sharyn Creamer RN, BSN Entered By: Sharyn Creamer on 11/28/2021 08:35:28 -------------------------------------------------------------------------------- Encounter Discharge Information Details Patient Name: Date of Service: [name], Jamielee 11/28/2021 8:30 A M Medical Record Number: [id number] Patient Account Number: [id number] Date of Birth/Sex: Treating RN: 1932-12-11 (88 y.o. [name] Primary Care Judythe Postema: Dettinger, Vonna Kotyk Other Clinician: Referring Maks Cavallero: Treating Samwise Eckardt/Extender: Fredirick Maudlin Dettinger, [name] in Treatment: 14 Encounter Discharge Information Items Post Procedure Vitals Discharge Condition: Stable Temperature  (F): 98.3 Ambulatory Status: Walker Pulse (bpm): 82 Discharge Destination: Home Respiratory Rate (breaths/min): 18 Transportation: Private Auto Blood Pressure (mmHg): 120/76 Accompanied By: son Schedule Follow-up Appointment: Yes Clinical Summary of Care: Patient Declined Electronic Signature(s) Signed: 11/28/2021 4:41:51 PM By: Sharyn Creamer RN, BSN Entered By: Sharyn Creamer on 11/28/2021 09:45:59 -------------------------------------------------------------------------------- Lower Extremity Assessment Details Patient Name: Date of Service: [name], Cristyn 11/28/2021 8:30 A M Medical Record Number: [id number] Patient Account Number: [id number] Date of Birth/Sex: Treating RN: 1933-04-13 (88 y.o. [name] Primary Care Averill Winters: Dettinger, Vonna Kotyk Other Clinician: Referring Appolonia Ackert: Treating Alfonsa Vaile/Extender: Fredirick Maudlin Dettinger, [name] in Treatment: 14 Edema Assessment Assessed: Shirlyn Goltz: No] Patrice Paradise: No] Edema: [Left: N] [Right: o] Calf Left: Right: Point of Measurement: 33 cm From Medial Instep 29.5 cm Ankle Left: Right: Point of Measurement: 9 cm From Medial Instep 21 cm Vascular Assessment Pulses: Dorsalis Pedis Palpable: [Left:Yes] Electronic Signature(s) Signed: 11/28/2021 4:41:51 PM By: Sharyn Creamer RN, BSN Entered By: Sharyn Creamer on 11/28/2021 08:39:47 -------------------------------------------------------------------------------- Multi Wound Chart Details Patient Name: Date of Service: [name], Palyn 11/28/2021 8:30 A M Medical Record Number: [id number] Patient Account Number: [id number] Date of Birth/Sex: Treating RN: Sep 15, 1932 (88 y.o. [name] Primary Care Piero Mustard: Dettinger, Vonna Kotyk Other Clinician: Referring Richell Corker: Treating Danely Bayliss/Extender: Fredirick Maudlin Dettinger, [name] in Treatment: 14 Vital Signs Height(in): 60 Pulse(bpm): 44 Weight(lbs): 119 Blood Pressure(mmHg): 120/76 Body Mass  Index(BMI): 23.2 Temperature(F): 98.3 Respiratory Rate(breaths/min): 18 Photos: [1:Left, Medial Foot] [N/A:No Photos N/A Left, Medial Foot N/A] Wound Location: [1:Gradually Appeared] [N/A:Gradually Appeared N/A] Wounding Event: [1:Pressure Ulcer] [N/A:Pressure Ulcer N/A] Primary Etiology: [1:Cataracts, Hypertension, Rheumatoid Cataracts, Hypertension, Rheumatoid N/A] Comorbid History: [1:Arthritis 05/22/2021] [N/A:Arthritis 05/22/2021 N/A] Date Acquired: [1:14] [N/A:14 N/A] Weeks of Treatment: [1:Open] [N/A:Open N/A] Wound Status: [1:No] [N/A:No N/A] Wound Recurrence: [1:1x0.8x0.1] [N/A:1x0.8x0.1 N/A] Measurements L x W x D (cm) [1:0.628] [N/A:0.628 N/A] A (cm) : rea [1:0.063] [N/A:0.063 N/A] Volume (cm) : [1:81.80%] [N/A:81.80% N/A] % Reduction in A [1:rea: 81.80%] [N/A:81.80% N/A] %  Reduction in Volume: [1:Category/Stage III] [N/A:Category/Stage III N/A] Classification: [1:Medium] [N/A:Medium N/A] Exudate A mount: [1:Serous] [N/A:Serous N/A] Exudate Type: [1:amber] [N/A:amber N/A] Exudate Color: [1:Well defined, not attached] [N/A:Well defined, not attached N/A] Wound Margin: [1:Medium (34-66%)] [N/A:Medium (34-66%) N/A] Granulation A mount: [1:Red] [N/A:Red N/A] Granulation Quality: [1:Medium (34-66%)] [N/A:Medium (34-66%) N/A] Necrotic A mount: [1:Adherent Slough] [N/A:Eschar, Adherent Slough N/A] Necrotic Tissue: [1:Fat Layer (Subcutaneous Tissue): Yes Fat Layer (Subcutaneous Tissue): Yes N/A] Exposed Structures: [1:Fascia: No Tendon: No Muscle: No Joint: No Bone: No Small (1-33%)] [N/A:Fascia: No Tendon: No Muscle: No Joint: No Bone: No Small (1-33%) N/A] Epithelialization: [1:Debridement - Selective/Open Wound Debridement - Selective/Open Wound N/A] Debridement: Pre-procedure Verification/Time Out 08:48 [N/A:08:48 N/A] Taken: [1:Lidocaine 5% topical ointment] [N/A:Lidocaine 5% topical ointment N/A] Pain Control: [1:Necrotic/Eschar, Slough] [N/A:Necrotic/Eschar, Slough  N/A] Tissue Debrided: [1:Non-Viable Tissue] [N/A:Non-Viable Tissue N/A] Level: [1:1] [N/A:1 N/A] Debridement A (sq cm): [1:rea Curette] [N/A:Curette N/A] Instrument: [1:Minimum] [N/A:Minimum N/A] Bleeding: [1:Pressure] [N/A:Pressure N/A] Hemostasis A chieved: [1:0] [N/A:0 N/A] Procedural Pain: [1:0] [N/A:0 N/A] Post Procedural Pain: [1:Procedure was tolerated well] [N/A:Procedure was tolerated well N/A] Debridement Treatment Response: [1:1x0.8x0.1] [N/A:1x0.8x0.1 N/A] Post Debridement Measurements L x W x D (cm) [1:0.063] [N/A:0.063 N/A] Post Debridement Volume: (cm) [1:Category/Stage III] [N/A:Category/Stage III N/A] Post Debridement Stage: [1:Cellular or Tissue Based Product] [N/A:Cellular or Tissue Based Product N/A] Procedures Performed: [1:Debridement] [N/A:Debridement] Treatment Notes Electronic Signature(s) Signed: 11/28/2021 9:38:26 AM By: Fredirick Maudlin MD FACS Signed: 11/29/2021 4:07:48 PM By: Adline Peals Entered By: Fredirick Maudlin on 11/28/2021 09:38:25 -------------------------------------------------------------------------------- Multi-Disciplinary Care Plan Details Patient Name: Date of Service: [name], Shakeila 11/28/2021 8:30 A M Medical Record Number: [id number] Patient Account Number: [id number] Date of Birth/Sex: Treating RN: 1933-02-19 (88 y.o. [name] Primary Care Provider: Dettinger, Vonna Kotyk Other Clinician: Referring Provider: Treating Provider/Extender: Fredirick Maudlin Dettinger, [name] in Treatment: 14 Active Inactive Abuse / Safety / Falls / Self Care Management Nursing Diagnoses: Impaired physical mobility Potential for falls Potential for injury related to falls Goals: Patient will remain injury free related to falls Date Initiated: 08/20/2021 Target Resolution Date: 12/20/2021 Goal Status: Active Patient/caregiver will demonstrate safe use of adaptive devices to increase mobility Date Initiated: 08/20/2021 Date  Inactivated: 09/11/2021 Target Resolution Date: 10/11/2021 Goal Status: Met Patient/caregiver will verbalize understanding of skin care regimen Date Initiated: 08/20/2021 Date Inactivated: 09/11/2021 Target Resolution Date: 10/11/2021 Goal Status: Met Interventions: Assess fall risk on admission and as needed Assess impairment of mobility on admission and as needed per policy Provide education on fall prevention Notes: Wound/Skin Impairment Nursing Diagnoses: Impaired tissue integrity Knowledge deficit related to ulceration/compromised skin integrity Goals: Patient/caregiver will verbalize understanding of skin care regimen Date Initiated: 08/20/2021 Date Inactivated: 09/11/2021 Target Resolution Date: 09/10/2021 Goal Status: Met Ulcer/skin breakdown will have a volume reduction of 30% by week 4 Date Initiated: 08/20/2021 Target Resolution Date: 12/20/2021 Goal Status: Active Interventions: Assess patient/caregiver ability to perform ulcer/skin care regimen upon admission and as needed Assess ulceration(s) every visit Treatment Activities: Referred to DME provider for dressing supplies : 08/20/2021 Skin care regimen initiated : 08/20/2021 Topical wound management initiated : 08/20/2021 Notes: Electronic Signature(s) Signed: 11/28/2021 4:41:51 PM By: Sharyn Creamer RN, BSN Entered By: Sharyn Creamer on 11/28/2021 08:43:18 -------------------------------------------------------------------------------- Pain Assessment Details Patient Name: Date of Service: [name], Myndi 11/28/2021 8:30 A M Medical Record Number: [id number] Patient Account Number: [id number] Date of Birth/Sex: Treating RN: 06-06-1932 (88 y.o. [name] Primary Care Provider: Dettinger, Vonna Kotyk Other Clinician: Referring Provider: Treating Provider/Extender: Celine Ahr,  Anderson [place] Dettinger, [name] in Treatment: 14 Active Problems Location of Pain Severity and Description of Pain Patient Has Paino Yes Site  Locations Rate the pain. Rate the pain. Current Pain Level: 8 Character of Pain Describe the Pain: Throbbing Pain Management and Medication Current Pain Management: Electronic Signature(s) Signed: 11/28/2021 4:41:51 PM By: Sharyn Creamer RN, BSN Entered By: Sharyn Creamer on 11/28/2021 08:39:31 -------------------------------------------------------------------------------- Patient/Caregiver Education Details Patient Name: Date of Service: [name], Aalina 8/10/2023andnbsp8:30 A M Medical Record Number: [id number] Patient Account Number: [id number] Date of Birth/Gender: Treating RN: 1932/12/18 (88 y.o. [name] Primary Care Physician: Dettinger, Vonna Kotyk Other Clinician: Referring Physician: Treating Physician/Extender: Fredirick Maudlin Dettinger, [name] in Treatment: 14 Education Assessment Education Provided To: Patient Education Topics Provided Wound/Skin Impairment: Methods: Explain/Verbal Responses: State content correctly [employer]) Signed: 11/28/2021 4:41:51 PM By: Sharyn Creamer RN, BSN Entered By: Sharyn Creamer on 11/28/2021 08:43:33 -------------------------------------------------------------------------------- Wound Assessment Details Patient Name: Date of Service: [name], Carmina 11/28/2021 8:30 A M Medical Record Number: [id number] Patient Account Number: [id number] Date of Birth/Sex: Treating RN: 03/29/1933 (88 y.o. [name] Primary Care Charnetta Wulff: Other Clinician: Dettinger, Vonna Kotyk Referring Herman Mell: Treating Hurshel Bouillon/Extender: Fredirick Maudlin Dettinger, [name] in Treatment: 14 Wound Status Wound Number: 1 Primary Etiology: Pressure Ulcer Wound Location: Left, Medial Foot Wound Status: Open Wounding Event: Gradually Appeared Comorbid History: Cataracts, Hypertension, Rheumatoid Arthritis Date Acquired: 05/22/2021 Weeks Of Treatment: 14 Clustered Wound: No Photos Wound Measurements Length: (cm)  1 Width: (cm) 0.8 Depth: (cm) 0.1 Area: (cm) 0.628 Volume: (cm) 0.063 % Reduction in Area: 81.8% % Reduction in Volume: 81.8% Epithelialization: Small (1-33%) Tunneling: No Undermining: No Wound Description Classification: Category/Stage III Wound Margin: Well defined, not attached Exudate Amount: Medium Exudate Type: Serous Exudate Color: amber Foul Odor After Cleansing: No Slough/Fibrino Yes Wound Bed Granulation Amount: Medium (34-66%) Exposed Structure Granulation Quality: Red Fascia Exposed: No Necrotic Amount: Medium (34-66%) Fat Layer (Subcutaneous Tissue) Exposed: Yes Necrotic Quality: Adherent Slough Tendon Exposed: No Muscle Exposed: No Joint Exposed: No Bone Exposed: No Electronic Signature(s) Signed: 11/28/2021 4:41:51 PM By: Sharyn Creamer RN, BSN Entered By: Sharyn Creamer on 11/28/2021 08:41:49 -------------------------------------------------------------------------------- Wound Assessment Details Patient Name: Date of Service: [name], Philana 11/28/2021 8:30 A M Medical Record Number: [id number] Patient Account Number: [id number] Date of Birth/Sex: Treating RN: 1932/09/11 (88 y.o. [name] Primary Care [place]: Dettinger, Vonna Kotyk Other Clinician: Referring Shada Nienaber: Treating Lirio Bach/Extender: Fredirick Maudlin Dettinger, [name] in Treatment: 14 Wound Status Wound Number: 1 Primary Etiology: Pressure Ulcer Wound Location: Left, Medial Foot Wound Status: Open Wounding Event: Gradually Appeared Comorbid History: Cataracts, Hypertension, Rheumatoid Arthritis Date Acquired: 05/22/2021 Weeks Of Treatment: 14 Clustered Wound: No Wound Measurements Length: (cm) 1 Width: (cm) 0.8 Depth: (cm) 0.1 Area: (cm) 0.628 Volume: (cm) 0.063 % Reduction in Area: 81.8% % Reduction in Volume: 81.8% Epithelialization: Small (1-33%) Tunneling: No Undermining: No Wound Description Classification: Category/Stage III Wound Margin: Well  defined, not attached Exudate Amount: Medium Exudate Type: Serous Exudate Color: amber Foul Odor After Cleansing: No Slough/Fibrino Yes Wound Bed Granulation Amount: Medium (34-66%) Exposed Structure Granulation Quality: Red Fascia Exposed: No Necrotic Amount: Medium (34-66%) Fat Layer (Subcutaneous Tissue) Exposed: Yes Necrotic Quality: Eschar, Adherent Slough Tendon Exposed: No Muscle Exposed: No Joint Exposed: No Bone Exposed: No Treatment Notes Wound #1 (Foot) Wound Laterality: Left, Medial Cleanser Soap and Water Discharge Instruction: May shower and wash wound with dial antibacterial soap and water prior to dressing change. Wound Cleanser Discharge Instruction:  Cleanse the wound with wound cleanser prior to applying a clean dressing using gauze sponges, not tissue or cotton balls. Peri-Wound Care Topical Primary Dressing Oasis wound matrix Secondary Dressing Optifoam Non-Adhesive Dressing, 4x4 in Discharge Instruction: Apply over primary dressing as directed. Woven Gauze Sponges 2x2 in Discharge Instruction: Apply over primary dressing as directed. Secured With Elastic Bandage 4 inch (ACE bandage) Discharge Instruction: Secure with ACE bandage as directed. Kerlix Roll Sterile, 4.5x3.1 (in/yd) Discharge Instruction: Secure with Kerlix as directed. 36M Medipore Soft Cloth Surgical T 2x10 (in/yd) ape Discharge Instruction: Secure with tape as directed. Compression Wrap Compression Stockings Add-Ons Electronic Signature(s) Signed: 11/28/2021 4:41:51 PM By: Sharyn Creamer RN, BSN Entered By: Sharyn Creamer on 11/28/2021 08:43:03 -------------------------------------------------------------------------------- Vitals Details Patient Name: Date of Service: [name], Takira 11/28/2021 8:30 A M Medical Record Number: [id number] Patient Account Number: [id number] Date of Birth/Sex: Treating RN: [birth year] (88 y.o. [name] Primary Care Provider: Dettinger,  Vonna Kotyk Other Clinician: Referring Provider: Treating Provider/Extender: Fredirick Maudlin Dettinger, [name] in Treatment: 14 Vital Signs Time Taken: 08:34 Temperature (F): 98.3 Height (in): 60 Pulse (bpm): 82 Weight (lbs): 119 Respiratory Rate (breaths/min): 18 Body Mass Index (BMI): 23.2 Blood Pressure (mmHg): 120/76 Reference Range: 80 - 120 mg / dl Electronic Signature(s) Signed: 11/28/2021 4:41:51 PM By: Sharyn Creamer RN, BSN Entered By: Sharyn Creamer on 11/28/2021 08:36:01

## 2021-12-05 ENCOUNTER — Encounter (HOSPITAL_BASED_OUTPATIENT_CLINIC_OR_DEPARTMENT_OTHER): Payer: Medicare Other | Admitting: General Surgery

## 2021-12-05 DIAGNOSIS — M069 Rheumatoid arthritis, unspecified: Secondary | ICD-10-CM | POA: Diagnosis not present

## 2021-12-05 DIAGNOSIS — L89893 Pressure ulcer of other site, stage 3: Secondary | ICD-10-CM | POA: Diagnosis not present

## 2021-12-05 DIAGNOSIS — S90812A Abrasion, left foot, initial encounter: Secondary | ICD-10-CM | POA: Diagnosis not present

## 2021-12-05 DIAGNOSIS — Z09 Encounter for follow-up examination after completed treatment for conditions other than malignant neoplasm: Secondary | ICD-10-CM | POA: Diagnosis not present

## 2021-12-05 DIAGNOSIS — L97526 Non-pressure chronic ulcer of other part of left foot with bone involvement without evidence of necrosis: Secondary | ICD-10-CM | POA: Diagnosis not present

## 2021-12-05 DIAGNOSIS — I10 Essential (primary) hypertension: Secondary | ICD-10-CM | POA: Diagnosis not present

## 2021-12-05 NOTE — Progress Notes (Signed)
JAVANNA, PATIN ([id number]) Visit Report for 12/05/2021 Arrival Information Details Patient Name: Date of Service: [name], Naveya 12/05/2021 9:15 A M Medical Record Number: [id number] Patient Account Number: [id number] Date of Birth/Sex: Treating RN: 1932/06/26 (88 y.o. Donney Rankins, Lovena Le Primary Care Nezzie Manera: Dettinger, Vonna Kotyk Other Clinician: Referring Jackye Dever: Treating Sierria Bruney/Extender: Fredirick Maudlin Dettinger, [name] in Treatment: 15 Visit Information History Since Last Visit Added or deleted any medications: No Patient Arrived: [name] Any new allergies or adverse reactions: No Arrival Time: 09:24 Had a fall or experienced change in No Accompanied By: son activities of daily living that may affect Transfer Assistance: None risk of falls: Patient Identification Verified: Yes Signs or symptoms of abuse/neglect since last visito No Secondary Verification Process Completed: Yes Hospitalized since last visit: No Patient Requires Transmission-Based Precautions: No Implantable device outside of the clinic excluding No cellular tissue based products placed in the center since last visit: Has Dressing in Place as Prescribed: Yes Pain Present Now: Yes Electronic Signature(s) Signed: 12/05/2021 3:33:24 PM By: Adline Peals Entered By: Adline Peals on 12/05/2021 09:29:36 -------------------------------------------------------------------------------- Encounter Discharge Information Details Patient Name: Date of Service: [name], Gelila 12/05/2021 9:15 A M Medical Record Number: [id number] Patient Account Number: [id number] Date of Birth/Sex: Treating RN: [birth year] (88 y.o. [name] Primary Care Garreth Burnsworth: Dettinger, Vonna Kotyk Other Clinician: Referring Cheyne Bungert: Treating Erhard Senske/Extender: Fredirick Maudlin Dettinger, [name] in Treatment: 15 Encounter Discharge Information Items Post Procedure Vitals Discharge Condition:  Stable Temperature (F): 98.1 Ambulatory Status: Walker Pulse (bpm): 79 Discharge Destination: Home Respiratory Rate (breaths/min): 18 Transportation: Private Auto Blood Pressure (mmHg): 150/75 Accompanied By: son Schedule Follow-up Appointment: Yes Clinical Summary of Care: Patient Declined Electronic Signature(s) Signed: 12/05/2021 3:33:24 PM By: Adline Peals Entered By: Adline Peals on 12/05/2021 10:03:18 -------------------------------------------------------------------------------- Lower Extremity Assessment Details Patient Name: Date of Service: [name], Madisun 12/05/2021 9:15 A M Medical Record Number: [id number] Patient Account Number: [id number] Date of Birth/Sex: Treating RN: 1933/04/11 (88 y.o. [name] Primary Care Graeson Nouri: Dettinger, Vonna Kotyk Other Clinician: Referring Breella Vanostrand: Treating Lari Linson/Extender: Fredirick Maudlin Dettinger, [name] in Treatment: 15 Edema Assessment Assessed: Shirlyn Goltz: No] Patrice Paradise: No] Edema: [Left: N] [Right: o] Calf Left: Right: Point of Measurement: 33 cm From Medial Instep 29 cm Ankle Left: Right: Point of Measurement: 9 cm From Medial Instep 21.5 cm Vascular Assessment Pulses: Dorsalis Pedis Palpable: [Left:Yes] Electronic Signature(s) Signed: 12/05/2021 3:33:24 PM By: Adline Peals Entered By: Adline Peals on 12/05/2021 09:32:20 -------------------------------------------------------------------------------- Multi Wound Chart Details Patient Name: Date of Service: [name], Madie 12/05/2021 9:15 A M Medical Record Number: [id number] Patient Account Number: [id number] Date of Birth/Sex: Treating RN: 29-Mar-1933 (88 y.o. [name] Primary Care Terez Freimark: Dettinger, Vonna Kotyk Other Clinician: Referring Elloise Roark: Treating Darilyn Storbeck/Extender: Fredirick Maudlin Dettinger, [name] in Treatment: 15 Vital Signs Height(in): 60 Pulse(bpm): 17 Weight(lbs): 119 Blood  Pressure(mmHg): 150/75 Body Mass Index(BMI): 23.2 Temperature(F): 98.1 Respiratory Rate(breaths/min): 18 Photos: [1:Left, Medial Foot] [N/A:N/A N/A] Wound Location: [1:Gradually Appeared] [N/A:N/A] Wounding Event: [1:Pressure Ulcer] [N/A:N/A] Primary Etiology: [1:Cataracts, Hypertension, Rheumatoid N/A] Comorbid History: [1:Arthritis 05/22/2021] [N/A:N/A] Date Acquired: [1:15] [N/A:N/A] Weeks of Treatment: [1:Open] [N/A:N/A] Wound Status: [1:No] [N/A:N/A] Wound Recurrence: [1:0.1x0.1x0.1] [N/A:N/A] Measurements L x W x D (cm) [1:0.008] [N/A:N/A] A (cm) : rea [1:0.001] [N/A:N/A] Volume (cm) : [1:99.80%] [N/A:N/A] % Reduction in A [1:rea: 99.70%] [N/A:N/A] % Reduction in Volume: [1:Category/Stage III] [N/A:N/A] Classification: [1:Medium] [N/A:N/A] Exudate A mount: [1:Serous] [N/A:N/A] Exudate Type: [1:amber] [N/A:N/A] Exudate Color: [1:Well defined, not attached] [N/A:N/A] Wound Margin: [  1:Small (1-33%)] [N/A:N/A] Granulation A mount: [1:Red] [N/A:N/A] Granulation Quality: [1:Large (67-100%)] [N/A:N/A] Necrotic A mount: [1:Eschar] [N/A:N/A] Necrotic Tissue: [1:Fat Layer (Subcutaneous Tissue): Yes N/A] Exposed Structures: [1:Fascia: No Tendon: No Muscle: No Joint: No Bone: No Large (67-100%)] [N/A:N/A] Epithelialization: [1:Debridement - Selective/Open Wound N/A] Debridement: Pre-procedure Verification/Time Out 09:40 [N/A:N/A] Taken: [1:Lidocaine 4% Topical Solution] [N/A:N/A] Pain Control: [1:Necrotic/Eschar, Slough] [N/A:N/A] Tissue Debrided: [1:Non-Viable Tissue] [N/A:N/A] Level: [1:0.25] [N/A:N/A] Debridement A (sq cm): [1:rea Curette] [N/A:N/A] Instrument: [1:Minimum] [N/A:N/A] Bleeding: [1:Pressure] [N/A:N/A] Hemostasis A chieved: [1:0] [N/A:N/A] Procedural Pain: [1:0] [N/A:N/A] Post Procedural Pain: [1:Procedure was tolerated well] [N/A:N/A] Debridement Treatment Response: [1:0.4x0.4x0.1] [N/A:N/A] Post Debridement Measurements L x W x D (cm) [1:0.013]  [N/A:N/A] Post Debridement Volume: (cm) [1:Category/Stage III] [N/A:N/A] Post Debridement Stage: [1:Cellular or Tissue Based Product] [N/A:N/A] Procedures Performed: [1:Debridement] Treatment Notes Wound #1 (Foot) Wound Laterality: Left, Medial Cleanser Soap and Water Discharge Instruction: May shower and wash wound with dial antibacterial soap and water prior to dressing change. Wound Cleanser Discharge Instruction: Cleanse the wound with wound cleanser prior to applying a clean dressing using gauze sponges, not tissue or cotton balls. Peri-Wound Care Topical Primary Dressing Oasis wound matrix Secondary Dressing Optifoam Non-Adhesive Dressing, 4x4 in Discharge Instruction: Apply over primary dressing as directed. Woven Gauze Sponges 2x2 in Discharge Instruction: Apply over primary dressing as directed. Secured With Elastic Bandage 4 inch (ACE bandage) Discharge Instruction: Secure with ACE bandage as directed. Kerlix Roll Sterile, 4.5x3.1 (in/yd) Discharge Instruction: Secure with Kerlix as directed. 45M Medipore Soft Cloth Surgical T 2x10 (in/yd) ape Discharge Instruction: Secure with tape as directed. Compression Wrap Compression Stockings Add-Ons Electronic Signature(s) Signed: 12/05/2021 10:34:56 AM By: Fredirick Maudlin MD FACS Signed: 12/05/2021 3:33:24 PM By: Sabas Sous By: Fredirick Maudlin on 12/05/2021 10:34:56 -------------------------------------------------------------------------------- Multi-Disciplinary Care Plan Details Patient Name: Date of Service: [name], Shaletha 12/05/2021 9:15 A M Medical Record Number: [id number] Patient Account Number: [id number] Date of Birth/Sex: Treating RN: 1932-07-22 (88 y.o. [name] Primary Care Prisilla Kocsis: Dettinger, Vonna Kotyk Other Clinician: Referring Humberto Addo: Treating Caramia Boutin/Extender: Fredirick Maudlin Dettinger, [name] in Treatment: 15 Active Inactive Abuse / Safety / Falls / Self  Care Management Nursing Diagnoses: Impaired physical mobility Potential for falls Potential for injury related to falls Goals: Patient will remain injury free related to falls Date Initiated: 08/20/2021 Target Resolution Date: 12/20/2021 Goal Status: Active Patient/caregiver will demonstrate safe use of adaptive devices to increase mobility Date Initiated: 08/20/2021 Date Inactivated: 09/11/2021 Target Resolution Date: 10/11/2021 Goal Status: Met Patient/caregiver will verbalize understanding of skin care regimen Date Initiated: 08/20/2021 Date Inactivated: 09/11/2021 Target Resolution Date: 10/11/2021 Goal Status: Met Interventions: Assess fall risk on admission and as needed Assess impairment of mobility on admission and as needed per policy Provide education on fall prevention Notes: Wound/Skin Impairment Nursing Diagnoses: Impaired tissue integrity Knowledge deficit related to ulceration/compromised skin integrity Goals: Patient/caregiver will verbalize understanding of skin care regimen Date Initiated: 08/20/2021 Date Inactivated: 09/11/2021 Target Resolution Date: 09/10/2021 Goal Status: Met Ulcer/skin breakdown will have a volume reduction of 30% by week 4 Date Initiated: 08/20/2021 Target Resolution Date: 12/20/2021 Goal Status: Active Interventions: Assess patient/caregiver ability to perform ulcer/skin care regimen upon admission and as needed Assess ulceration(s) every visit Treatment Activities: Referred to DME Jaelyn Cloninger for dressing supplies : 08/20/2021 Skin care regimen initiated : 08/20/2021 Topical wound management initiated : 08/20/2021 Notes: Electronic Signature(s) Signed: 12/05/2021 3:33:24 PM By: Adline Peals Entered By: Adline Peals on 12/05/2021 09:37:22 -------------------------------------------------------------------------------- Pain Assessment Details Patient Name: Date of Service:  [name], Morrisa 12/05/2021 9:15 A M Medical Record Number:  [id number] Patient Account Number: [id number] Date of Birth/Sex: Treating RN: 10-24-1932 (88 y.o. [name] Primary Care Jermani Pund: Dettinger, Vonna Kotyk Other Clinician: Referring Lilyannah Zuelke: Treating Oliviah Agostini/Extender: Fredirick Maudlin Dettinger, [name] in Treatment: 15 Active Problems Location of Pain Severity and Description of Pain Patient Has Paino Yes Site Locations Pain Location: Pain in Ulcers Duration of the Pain. Constant / Intermittento Intermittent Rate the pain. Current Pain Level: 7 Pain Management and Medication Current Pain Management: Electronic Signature(s) Signed: 12/05/2021 3:33:24 PM By: Adline Peals Entered By: Adline Peals on 12/05/2021 09:31:56 -------------------------------------------------------------------------------- Patient/Caregiver Education Details Patient Name: Date of Service: [name], Rasheedah 8/17/2023andnbsp9:15 A M Medical Record Number: [id number] Patient Account Number: [id number] Date of Birth/Gender: Treating RN: 1932/10/18 (88 y.o. [name] Primary Care Physician: Dettinger, Vonna Kotyk Other Clinician: Referring Physician: Treating Physician/Extender: Fredirick Maudlin Dettinger, [name] in Treatment: 15 Education Assessment Education Provided To: Patient Education Topics Provided Wound/Skin Impairment: Methods: Explain/Verbal Responses: Reinforcements needed, State content correctly Electronic Signature(s) Signed: 12/05/2021 3:33:24 PM By: Adline Peals Entered By: Adline Peals on 12/05/2021 09:37:32 -------------------------------------------------------------------------------- Wound Assessment Details Patient Name: Date of Service: [name], Emelina 12/05/2021 9:15 A M Medical Record Number: [id number] Patient Account Number: [id number] Date of Birth/Sex: Treating RN: 1933/01/25 (88 y.o. [name] Primary Care Kiasha Bellin: Dettinger, Vonna Kotyk Other  Clinician: Referring Aeneas Longsworth: Treating Korbyn Vanes/Extender: Fredirick Maudlin Dettinger, [name] in Treatment: 15 Wound Status Wound Number: 1 Primary Etiology: Pressure Ulcer Wound Location: Left, Medial Foot Wound Status: Open Wounding Event: Gradually Appeared Comorbid History: Cataracts, Hypertension, Rheumatoid Arthritis Date Acquired: 05/22/2021 Weeks Of Treatment: 15 Clustered Wound: No Photos Wound Measurements Length: (cm) 0.1 Width: (cm) 0.1 Depth: (cm) 0.1 Area: (cm) 0.008 Volume: (cm) 0.001 % Reduction in Area: 99.8% % Reduction in Volume: 99.7% Epithelialization: Large (67-100%) Tunneling: No Undermining: No Wound Description Classification: Category/Stage III Wound Margin: Well defined, not attached Exudate Amount: Medium Exudate Type: Serous Exudate Color: amber Foul Odor After Cleansing: No Slough/Fibrino No Wound Bed Granulation Amount: Small (1-33%) Exposed Structure Granulation Quality: Red Fascia Exposed: No Necrotic Amount: Large (67-100%) Fat Layer (Subcutaneous Tissue) Exposed: Yes Necrotic Quality: Eschar Tendon Exposed: No Muscle Exposed: No Joint Exposed: No Bone Exposed: No Treatment Notes Wound #1 (Foot) Wound Laterality: Left, Medial Cleanser Soap and Water Discharge Instruction: May shower and wash wound with dial antibacterial soap and water prior to dressing change. Wound Cleanser Discharge Instruction: Cleanse the wound with wound cleanser prior to applying a clean dressing using gauze sponges, not tissue or cotton balls. Peri-Wound Care Topical Primary Dressing Oasis wound matrix Secondary Dressing Optifoam Non-Adhesive Dressing, 4x4 in Discharge Instruction: Apply over primary dressing as directed. Woven Gauze Sponges 2x2 in Discharge Instruction: Apply over primary dressing as directed. Secured With Elastic Bandage 4 inch (ACE bandage) Discharge Instruction: Secure with ACE bandage as directed. Kerlix Roll  Sterile, 4.5x3.1 (in/yd) Discharge Instruction: Secure with Kerlix as directed. 55M Medipore Soft Cloth Surgical T 2x10 (in/yd) ape Discharge Instruction: Secure with tape as directed. Compression Wrap Compression Stockings Add-Ons Electronic Signature(s) Signed: 12/05/2021 3:33:24 PM By: Adline Peals Entered By: Adline Peals on 12/05/2021 09:36:33 -------------------------------------------------------------------------------- Vitals Details Patient Name: Date of Service: [name], Alycen 12/05/2021 9:15 A M Medical Record Number: [id number] Patient Account Number: [id number] Date of Birth/Sex: Treating RN: [birth year] (88 y.o. [name] Primary Care Jawon Dipiero: Dettinger, Vonna Kotyk Other Clinician: Referring Denea Cheaney: Treating Jaimes Eckert/Extender: Fredirick Maudlin Dettinger, [name]  in Treatment: 15 Vital Signs Time Taken: 09:29 Temperature (F): 98.1 Height (in): 60 Pulse (bpm): 79 Weight (lbs): 119 Respiratory Rate (breaths/min): 18 Body Mass Index (BMI): 23.2 Blood Pressure (mmHg): 150/75 Reference Range: 80 - 120 mg / dl Electronic Signature(s) Signed: 12/05/2021 3:33:24 PM By: Adline Peals Entered By: Adline Peals on 12/05/2021 09:31:29

## 2021-12-05 NOTE — Progress Notes (Signed)
[name] ([id number]) Visit Report for 12/05/2021 Chief Complaint Document Details Patient Name: Date of Service: [name], [name] 12/05/2021 9:15 [name] M Medical Record Number: [id number] Patient Account Number: [id number] Date of Birth/Sex: Treating RN: Feb 08, 1933 (88 y.o. [name] Primary Care Provider: Dettinger, Vonna [name] Other Clinician: Referring Provider: Treating Provider/Extender: [name] [name] in Treatment: 15 Information Obtained from: Patient Chief Complaint Patient is at the clinic for treatment of an open ulcer on her left medial first metatarsal head, secondary to friction and pressure. Electronic Signature(s) Signed: 12/05/2021 10:35:01 AM By: [name] Maudlin MD FACS Entered By: [name] on 12/05/2021 10:35:01 -------------------------------------------------------------------------------- Cellular or Tissue Based Product Details Patient Name: Date of Service: [name], [name] 12/05/2021 9:15 [name] M Medical Record Number: [id number] Patient Account Number: [id number] Date of Birth/Sex: Treating RN: 12/21/1932 (88 y.o. [name] Primary Care Provider: Dettinger, Vonna [name] Other Clinician: Referring Provider: Treating Provider/Extender: [name] [name] in Treatment: 15 Cellular or Tissue Based Product Type Wound #1 Left,Medial Foot Applied to: Performed By: Physician [name] Maudlin, MD Cellular or Tissue Based Product Type: Oasis wound matrix Level of Consciousness (Pre-procedure): Awake and Alert Pre-procedure Verification/Time Out Yes - 09:50 Taken: Location: genitalia / hands / feet / multiple digits Wound Size (sq cm): 0.01 Product Size (sq cm): 10 Waste Size (sq cm): 4 Waste Reason: wound size Amount of Product Applied (sq cm): 6 Instrument Used: Curette, Forceps, Scissors Lot #: [id number] Expiration Date: 03/24/2023 Fenestrated: No Reconstituted: Yes Solution Type: normal  saline Solution Amount: 1 ml Lot #: [id number] Solution Expiration Date: 01/19/2022 Secured: Yes Secured With: Steri-Strips Dressing Applied: Yes Primary Dressing: adaptic Procedural Pain: 0 Post Procedural Pain: 0 Response to Treatment: Procedure was tolerated well Level of Consciousness (Post- Awake and Alert procedure): Post Procedure Diagnosis Same as Pre-procedure Electronic Signature(s) Signed: 12/05/2021 12:42:48 PM By: [name] Maudlin MD FACS Signed: 12/05/2021 3:33:24 PM By: [name] Entered By: [name] on 12/05/2021 11:27:59 -------------------------------------------------------------------------------- Debridement Details Patient Name: Date of Service: [name], [name] 12/05/2021 9:15 [name] M Medical Record Number: [id number] Patient Account Number: [id number] Date of Birth/Sex: Treating RN: [birth year] (88 y.o. [name] Primary Care Provider: Dettinger, Vonna [name] Other Clinician: Referring Provider: Treating Provider/Extender: [name] [name] in Treatment: 15 Debridement Performed for Assessment: Wound #1 Left,Medial Foot Performed By: Physician [name] Maudlin, MD Debridement Type: Debridement Level of Consciousness (Pre-procedure): Awake and Alert Pre-procedure Verification/Time Out Yes - 09:40 Taken: Start Time: 09:40 Pain Control: Lidocaine 4% T opical Solution T Area Debrided (L x W): otal 0.5 (cm) x 0.5 (cm) = 0.25 (cm) Tissue and other material debrided: Non-Viable, Eschar, Slough, Slough Level: Non-Viable Tissue Debridement Description: Selective/Open Wound Instrument: Curette Bleeding: Minimum Hemostasis Achieved: Pressure Procedural Pain: 0 Post Procedural Pain: 0 Response to Treatment: Procedure was tolerated well Level of Consciousness (Post- Awake and Alert procedure): Post Debridement Measurements of Total Wound Length: (cm) 0.4 Stage: Category/Stage III Width: (cm) 0.4 Depth: (cm)  0.1 Volume: (cm) 0.013 Character of Wound/Ulcer Post Debridement: Improved Post Procedure Diagnosis Same as Pre-procedure Electronic Signature(s) Signed: 12/05/2021 12:42:48 PM By: [name] Maudlin MD FACS Signed: 12/05/2021 3:33:24 PM By: [name] Entered By: [name] on 12/05/2021 09:44:25 -------------------------------------------------------------------------------- HPI Details Patient Name: Date of Service: [name], [name] 12/05/2021 9:15 [name] M Medical Record Number: [id number] Patient Account Number: [id number] Date of Birth/Sex: Treating RN: 13-Sep-1932 (88 y.o. [name] Primary Care Provider: Dettinger, Vonna [name] Other Clinician: Referring Provider:  Treating Provider/Extender: [name] [name] in Treatment: 15 History of Present Illness HPI Description: ADMISSION 08/20/2021 This is an 88 year old Spanish-speaking patient who is being evaluated for an ulcer on the medial aspect of the left first metatarsal head. She says that in February, her shoes were rubbing against [name] bunion in this location and she first developed [name] blister that ultimately broke down to an ulcer. She is not [name] diabetic and she does not smoke. She does have some high blood pressure as well as rheumatoid arthritis. ABI in clinic was noncompressible. When the wound first appeared, she was seen in urgent care and was started on Bactrim. Her PCP followed up and recommended daily Xeroform gauze dressing changes. Initially, it sounds like the wound was quite painful but it is now quite improved. Apparently she had been changing the dressing twice [name] day at [name] minimum, and per her PCP's note she may be scrubbing it quite aggressively up to 4 times [name] day. Today, the wound is clean, but there is perimeter of undermining. I am not entirely sure that I am probing bone but we are certainly very close to it. The surface is [name] little bit pale but otherwise healthy. No odor or  concern for infection. 08/27/2021: The wound is about the same today. There is [name] little bit more undermining underneath the callus that is formed around the perimeter. The wound surface is pink and moist. No odor or significant drainage. 09/04/2021: The wound is the same size today. It is very clean and the degree of undermining is about the same. The surface is [name] little bit hypertrophic. We have been using silver collagen and the patient's son reports that they have been making [name] concerted effort to tuck the dressing into the undermined area of tissue. No concern for infection. 09/11/2021: The wound is really unchanged with perhaps [name] little bit less undermining. It is clean and they have been using silver collagen, taking care to tuck it into the undermined area. I thought we had applied for snap VAC approval last week, but for some reason we are unable to find this. She does say that the degree of pain she is experiencing is less and that it sometimes just throbs at night but is better than previously. 09/18/2021: Last week, due to lack of progression with the wound, I took [name] culture that grew out skin flora but I also got an x-ray that was suggestive of osteomyelitis. She continues to complain of pain and throbbing at night but nothing throughout the day. She has been approved for snap VAC use with [name] $70- $100 co-pay with each application. The wound itself really looks no different today. 09/25/2021: I initiated doxycycline last week and she has been taking this. We also applied the snap VAC. The wound looks markedly better today with significant closure of the undermined portion. She continues to have some throbbing pain at night, but otherwise is doing well. We ordered an MRI to get [name] more definitive answer regarding the possibility of osteomyelitis, but they have not yet received an appointment for this. 10/02/2021: She had her MRI just prior to coming to clinic so I do not yet have any results. She  continues to have throbbing pain at night. The wound itself is [name] bit smaller. There is some periwound moisture with [name] little bit of slough but the undermined area is almost closed and I am unable to probe to bone. 10/10/2021: Unfortunately, the MRI done last week showed  evidence of osteomyelitis. I renewed her doxycycline prescription for an additional 4 weeks. She has an appointment with infectious disease next week. Her primary care provider prescribed diclofenac which seems to be helping with her throbbing nighttime pain. The wound is about the same size today but there is no undermining present whatsoever. The surface is healthy with just [name] small amount of slough on the most proximal aspect of the wound. 10/16/2021: T oday, she is complaining that for the last 3 days, her foot has been more swollen and tender. She does have some visible swelling present and her foot feels warmer than normal. There is some erythema surrounding her wound and over the dorsum of her foot. She says that when she takes [name] step, the ball of her foot hurts more than it has. She has been taking doxycycline and seems to be tolerating this well. Her son also purchased some Pro-Stat and she has been taking this, although she does not particularly care for the taste. She has an appointment with infectious disease this morning after our visit. The wound itself is [name] little bit smaller. The periwound skin is slightly macerated but there is no undermining and there is granulation tissue on the surface. No odor or purulent drainage. 10/23/2021: She saw infectious disease last week and they initiated linezolid and levofloxacin. Unfortunately, this has resulted in [name] lot of nausea and vomiting on the patient's part. She is not taking either medication with any food and this seems to be part of the issue. Her overall oral intake remains quite poor, despite trying to get Pro-Stat and other agents into her system. She did start taking [name]  probiotic but subsequently stopped taking the antibiotic so its not clear if this has had any beneficial effect or not. She is also scheduled to have formal ABIs performed in [name] couple of weeks, again per infectious disease. Her wound is [name] little bit smaller today and has [name] clean surface with just [name] little bit of biofilm and slough accumulation. The undermining and tunneling remain closed. 10/30/2021: Her son contacted infectious disease and they adjusted her antibiotic regimen. She is tolerating the new antibiotics without nausea or vomiting. She feels like the swelling and pain in her foot has diminished since initiating antibiotic therapy. The wound is nearly flush with the surrounding skin and has granulation tissue with just [name] little bit of slough. 11/06/2021: The snap VAC fell off last Friday. In the interim, they have been applying Prisma silver collagen to the site. The wound surface looks fairly robust and pink. There is [name] thin layer of slough on the wound surface. No undermining present. Swelling and warmth has improved. The patient continues to struggle with poor appetite; as result of not eating well, she is also having difficulty tolerating her oral antibiotic regimen. She is scheduled to have ABIs performed later this morning. 11/19/2021: ABIs were done 2 weeks ago. Although she has normal ABIs, both TBI's were abnormal. 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 She saw infectious disease [name] week ago; she will complete 2 more weeks of antibiotics from the time of that visit. She has responded well to the Megace that I prescribed and her appetite has improved. We discontinued the wound VAC and have been applying endoform. T oday, the wound is markedly improved. There is good perimeter epithelialization and contracture of the wound. She has robust granulation tissue forming and  [name] minimum of slough. We are  still working on [employer] for [employer]. 11/28/2021: Last week we applied Grafix plus. T oday, although there is [name] hard layer of eschar overlying the wound, underneath this, it is smaller and has lovely robust granulation tissue. Bone is completely covered. She continues to complain of pulsatile pain at night but this seems to be managed fairly well with the meloxicam and Tylenol that she has been taking. Her appetite continues to be good on Megace. She will complete her oral antibiotics after today's doses. She has an appointment with vascular surgery to address her abnormal ABIs coming up on 23 August. She has received approval for Oasis. 12/05/2021: Today, the wound has decreased in size considerably. There is [name] layer of eschar on the surface, but underneath the granulation tissue is very healthy. Last week, I was unaware that she has [name] 20% co-pay with Oasis, as she has not yet met her out-of-pocket maximum. I discussed this with the patient and her son and they expressed [name] desire to proceed with Oasis #2 application today anyway. Electronic Signature(s) Signed: 12/05/2021 10:36:41 AM By: [name] Maudlin MD FACS Entered By: [name] on 12/05/2021 10:36:41 -------------------------------------------------------------------------------- Physical Exam Details Patient Name: Date of Service: [name], [name] 12/05/2021 9:15 [name] M Medical Record Number: [id number] Patient Account Number: [id number] Date of Birth/Sex: Treating RN: 1932/11/15 (88 y.o. [name] Primary Care Provider: Dettinger, Vonna [name] Other Clinician: Referring Provider: Treating Provider/Extender: [name] [name] in Treatment: 15 Constitutional Hypertensive, asymptomatic. . . . No acute distress.Marland Kitchen Respiratory Normal work of breathing on room air.. Notes 12/05/2021: Today, the wound has decreased in size considerably. There is [name] layer of eschar on the surface, but underneath the  granulation tissue is very healthy. Electronic Signature(s) Signed: 12/05/2021 10:42:57 AM By: [name] Maudlin MD FACS Entered By: [name] on 12/05/2021 10:42:57 -------------------------------------------------------------------------------- Physician Orders Details Patient Name: Date of Service: [name], [name] 12/05/2021 9:15 [name] M Medical Record Number: [id number] Patient Account Number: [id number] Date of Birth/Sex: Treating RN: 07-Aug-1932 (88 y.o. [name] Primary Care Provider: Dettinger, Vonna [name] Other Clinician: Referring Provider: Treating Provider/Extender: [name] [name] in Treatment: 15 Verbal / Phone Orders: No Diagnosis Coding ICD-10 Coding Code Description [phone] Non-pressure chronic ulcer of other part of left foot with bone involvement without evidence of necrosis S90.812D Abrasion, left foot, subsequent encounter Follow-up Appointments ppointment in 1 week. - Dr. Celine Ahr - Room 2 - 8/24 at 8:30 AM Return [name] Anesthetic Wound #1 Left,Medial Foot (In clinic) Topical Lidocaine 4% applied to wound bed Cellular or Tissue Based Products Wound #1 Left,Medial Foot daptic or Mepitel. (DO NOT REMOVE). - Oasis Cellular or Tissue Based Product applied to wound bed, secured with steri-strips, cover with [name] - DO NOT REMOVE OR GET WET Bathing/ Shower/ Hygiene May shower with protection but do not get wound dressing(s) wet. Wound Treatment Wound #1 - Foot Wound Laterality: Left, Medial Cleanser: Soap and Water 1 x Per Week/15 Days Discharge Instructions: May shower and wash wound with dial antibacterial soap and water prior to dressing change. Cleanser: Wound Cleanser 1 x Per Week/15 Days Discharge Instructions: Cleanse the wound with wound cleanser prior to applying [name] clean dressing using gauze sponges, not tissue or cotton balls. Prim Dressing: Oasis wound matrix ary 1 x Per Week/15 Days Secondary Dressing: Optifoam  Non-Adhesive Dressing, 4x4 in 1 x Per Week/15 Days Discharge Instructions: Apply over primary dressing as directed. Secondary Dressing: Woven  Gauze Sponges 2x2 in 1 x Per Week/15 Days Discharge Instructions: Apply over primary dressing as directed. Secured With: Elastic Bandage 4 inch (ACE bandage) 1 x Per Week/15 Days Discharge Instructions: Secure with ACE bandage as directed. Secured With: [employer], 4.5x3.1 (in/yd) 1 x Per Week/15 Days Discharge Instructions: Secure with Kerlix as directed. Secured With: 79M Medipore Public affairs consultant Surgical T 2x10 (in/yd) 1 x Per Week/15 Days ape Discharge Instructions: Secure with tape as directed. Electronic Signature(s) Signed: 12/05/2021 10:43:18 AM By: [name] Maudlin MD FACS Entered By: [name] on 12/05/2021 10:43:17 -------------------------------------------------------------------------------- Problem List Details Patient Name: Date of Service: [name], [name] 12/05/2021 9:15 [name] M Medical Record Number: [id number] Patient Account Number: [id number] Date of Birth/Sex: Treating RN: 1933/03/22 (88 y.o. [name] Primary Care Provider: Dettinger, Vonna [name] Other Clinician: Referring Provider: Treating Provider/Extender: [name] [name] in Treatment: 15 Active Problems ICD-10 Encounter Code Description Active Date MDM Diagnosis L97.526 Non-pressure chronic ulcer of other part of left foot with bone involvement 08/20/2021 No Yes without evidence of necrosis S90.812D Abrasion, left foot, subsequent encounter 11/06/2021 No Yes Inactive Problems Resolved Problems Electronic Signature(s) Signed: 12/05/2021 10:34:50 AM By: [name] Maudlin MD FACS Entered By: [name] on 12/05/2021 10:34:50 -------------------------------------------------------------------------------- Progress Note Details Patient Name: Date of Service: [name], [name] 12/05/2021 9:15 [name] M Medical Record Number:  [id number] Patient Account Number: [id number] Date of Birth/Sex: Treating RN: [birth year] (88 y.o. [name] Primary Care Provider: Dettinger, Vonna [name] Other Clinician: Referring Provider: Treating Provider/Extender: [name] [name] in Treatment: 15 Subjective Chief Complaint Information obtained from Patient Patient is at the clinic for treatment of an open ulcer on her left medial first metatarsal head, secondary to friction and pressure. History of Present Illness (HPI) ADMISSION 08/20/2021 This is an 88 year old Spanish-speaking patient who is being evaluated for an ulcer on the medial aspect of the left first metatarsal head. She says that in February, her shoes were rubbing against [name] bunion in this location and she first developed [name] blister that ultimately broke down to an ulcer. She is not [name] diabetic and she does not smoke. She does have some high blood pressure as well as rheumatoid arthritis. ABI in clinic was noncompressible. When the wound first appeared, she was seen in urgent care and was started on Bactrim. Her PCP followed up and recommended daily Xeroform gauze dressing changes. Initially, it sounds like the wound was quite painful but it is now quite improved. Apparently she had been changing the dressing twice [name] day at [name] minimum, and per her PCP's note she may be scrubbing it quite aggressively up to 4 times [name] day. Today, the wound is clean, but there is perimeter of undermining. I am not entirely sure that I am probing bone but we are certainly very close to it. The surface is [name] little bit pale but otherwise healthy. No odor or concern for infection. 08/27/2021: The wound is about the same today. There is [name] little bit more undermining underneath the callus that is formed around the perimeter. The wound surface is pink and moist. No odor or significant drainage. 09/04/2021: The wound is the same size today. It is very clean and the degree of  undermining is about the same. The surface is [name] little bit hypertrophic. We have been using silver collagen and the patient's son reports that they have been making [name] concerted effort to tuck the dressing into the undermined area of tissue. No  concern for infection. 09/11/2021: The wound is really unchanged with perhaps [name] little bit less undermining. It is clean and they have been using silver collagen, taking care to tuck it into the undermined area. I thought we had applied for snap VAC approval last week, but for some reason we are unable to find this. She does say that the degree of pain she is experiencing is less and that it sometimes just throbs at night but is better than previously. 09/18/2021: Last week, due to lack of progression with the wound, I took [name] culture that grew out skin flora but I also got an x-ray that was suggestive of osteomyelitis. She continues to complain of pain and throbbing at night but nothing throughout the day. She has been approved for snap VAC use with [name] $70- $100 co-pay with each application. The wound itself really looks no different today. 09/25/2021: I initiated doxycycline last week and she has been taking this. We also applied the snap VAC. The wound looks markedly better today with significant closure of the undermined portion. She continues to have some throbbing pain at night, but otherwise is doing well. We ordered an MRI to get [name] more definitive answer regarding the possibility of osteomyelitis, but they have not yet received an appointment for this. 10/02/2021: She had her MRI just prior to coming to clinic so I do not yet have any results. She continues to have throbbing pain at night. The wound itself is [name] bit smaller. There is some periwound moisture with [name] little bit of slough but the undermined area is almost closed and I am unable to probe to bone. 10/10/2021: Unfortunately, the MRI done last week showed evidence of osteomyelitis. I renewed her  doxycycline prescription for an additional 4 weeks. She has an appointment with infectious disease next week. Her primary care provider prescribed diclofenac which seems to be helping with her throbbing nighttime pain. The wound is about the same size today but there is no undermining present whatsoever. The surface is healthy with just [name] small amount of slough on the most proximal aspect of the wound. 10/16/2021: T oday, she is complaining that for the last 3 days, her foot has been more swollen and tender. She does have some visible swelling present and her foot feels warmer than normal. There is some erythema surrounding her wound and over the dorsum of her foot. She says that when she takes [name] step, the ball of her foot hurts more than it has. She has been taking doxycycline and seems to be tolerating this well. Her son also purchased some Pro-Stat and she has been taking this, although she does not particularly care for the taste. She has an appointment with infectious disease this morning after our visit. The wound itself is [name] little bit smaller. The periwound skin is slightly macerated but there is no undermining and there is granulation tissue on the surface. No odor or purulent drainage. 10/23/2021: She saw infectious disease last week and they initiated linezolid and levofloxacin. Unfortunately, this has resulted in [name] lot of nausea and vomiting on the patient's part. She is not taking either medication with any food and this seems to be part of the issue. Her overall oral intake remains quite poor, despite trying to get Pro-Stat and other agents into her system. She did start taking [name] probiotic but subsequently stopped taking the antibiotic so its not clear if this has had any beneficial effect or not. She is also scheduled to  have formal ABIs performed in [name] couple of weeks, again per infectious disease. Her wound is [name] little bit smaller today and has [name] clean surface with just [name] little bit of  biofilm and slough accumulation. The undermining and tunneling remain closed. 10/30/2021: Her son contacted infectious disease and they adjusted her antibiotic regimen. She is tolerating the new antibiotics without nausea or vomiting. She feels like the swelling and pain in her foot has diminished since initiating antibiotic therapy. The wound is nearly flush with the surrounding skin and has granulation tissue with just [name] little bit of slough. 11/06/2021: The snap VAC fell off last Friday. In the interim, they have been applying Prisma silver collagen to the site. The wound surface looks fairly robust and pink. There is [name] thin layer of slough on the wound surface. No undermining present. Swelling and warmth has improved. The patient continues to struggle with poor appetite; as result of not eating well, she is also having difficulty tolerating her oral antibiotic regimen. She is scheduled to have ABIs performed later this morning. 11/19/2021: ABIs were done 2 weeks ago. Although she has normal ABIs, both TBI's were abnormal. 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 She saw infectious disease [name] week ago; she will complete 2 more weeks of antibiotics from the time of that visit. She has responded well to the Megace that I prescribed and her appetite has improved. We discontinued the wound VAC and have been applying endoform. T oday, the wound is markedly improved. There is good perimeter epithelialization and contracture of the wound. She has robust granulation tissue forming and [name] minimum of slough. 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 11/28/2021: Last week we applied Grafix plus. T oday, although there is [name] hard layer of eschar overlying the wound, underneath this, it is smaller and has lovely robust granulation tissue. Bone is completely covered. She continues to complain of pulsatile pain at night  but this seems to be managed fairly well with the meloxicam and Tylenol that she has been taking. Her appetite continues to be good on Megace. She will complete her oral antibiotics after today's doses. She has an appointment with vascular surgery to address her abnormal ABIs coming up on 23 August. She has received approval for Oasis. 12/05/2021: Today, the wound has decreased in size considerably. There is [name] layer of eschar on the surface, but underneath the granulation tissue is very healthy. Last week, I was unaware that she has [name] 20% co-pay with Oasis, as she has not yet met her out-of-pocket maximum. I discussed this with the patient and her son and they expressed [name] desire to proceed with Oasis #2 application today anyway. Patient History Information obtained from Patient. Family History Cancer - Siblings, No family history of Diabetes, Heart Disease, Hereditary Spherocytosis, Hypertension, Kidney Disease, Lung Disease, Seizures, Stroke, Thyroid Problems, Tuberculosis. Social History Never smoker, Marital Status - Married, Alcohol Use - Never, Drug Use - No History, Caffeine Use - Daily. Medical History Eyes Patient has history of Cataracts Cardiovascular Patient has history of Hypertension Musculoskeletal Patient has history of Rheumatoid Arthritis - hands and feet Hospitalization/Surgery History - cataract surgery. Medical [name] Surgical History Notes nd Eyes macular degeneration Ear/Nose/Mouth/Throat GERD, dysarthria Hematologic/Lymphatic dyslipidemia Cardiovascular palpitations Musculoskeletal osteoporosis, unsteady gait Neurologic TIA Objective Constitutional Hypertensive, asymptomatic. No acute distress.. Vitals Time Taken: 9:29 AM, Height: 60 in, Weight: 119 lbs, BMI: 23.2,  Temperature: 98.1 F, Pulse: 79 bpm, Respiratory Rate: 18 breaths/min, Blood Pressure: 150/75 mmHg. Respiratory Normal work of breathing on room air.. General Notes: 12/05/2021: Today, the wound  has decreased in size considerably. There is [name] layer of eschar on the surface, but underneath the granulation tissue is very healthy. Integumentary (Hair, Skin) Wound #1 status is Open. Original cause of wound was Gradually Appeared. The date acquired was: 05/22/2021. The wound has been in treatment 15 weeks. The wound is located on the Left,Medial Foot. The wound measures 0.1cm length x 0.1cm width x 0.1cm depth; 0.008cm^2 area and 0.001cm^3 volume. There is Fat Layer (Subcutaneous Tissue) exposed. There is no tunneling or undermining noted. There is [name] medium amount of serous drainage noted. The wound margin is well defined and not attached to the wound base. There is small (1-33%) red granulation within the wound bed. There is [name] large (67-100%) amount of necrotic tissue within the wound bed including Eschar. Assessment Active Problems ICD-10 Non-pressure chronic ulcer of other part of left foot with bone involvement without evidence of necrosis Abrasion, left foot, subsequent encounter Procedures Wound #1 Pre-procedure diagnosis of Wound #1 is [name] Pressure Ulcer located on the Left,Medial Foot . There was [name] Selective/Open Wound Non-Viable Tissue Debridement with [name] total area of 0.25 sq cm performed by [name] Maudlin, MD. With the following instrument(s): Curette to remove Non-Viable tissue/material. Material removed includes Eschar and Slough and after achieving pain control using Lidocaine 4% T opical Solution. No specimens were taken. [name] time out was conducted at 09:40, prior to the start of the procedure. [name] Minimum amount of bleeding was controlled with Pressure. The procedure was tolerated well with [name] pain level of 0 throughout and [name] pain level of 0 following the procedure. Post Debridement Measurements: 0.4cm length x 0.4cm width x 0.1cm depth; 0.013cm^3 volume. Post debridement Stage noted as Category/Stage III. Character of Wound/Ulcer Post Debridement is improved. Post procedure  Diagnosis Wound #1: Same as Pre-Procedure Pre-procedure diagnosis of Wound #1 is [name] Pressure Ulcer located on the Left,Medial Foot. [name] skin graft procedure using [name] bioengineered skin substitute/cellular or tissue based product was performed by [name] Maudlin, MD with the following instrument(s): Curette, Forceps, and Scissors. Oasis wound matrix was applied and secured with Steri-Strips. 6 sq cm of product was utilized and 4 sq cm was wasted due to wound size. Post Application, adaptic was applied. [name] Time Out was conducted at 09:50, prior to the start of the procedure. The procedure was tolerated well with [name] pain level of 0 throughout and [name] pain level of 0 following the procedure. Post procedure Diagnosis Wound #1: Same as Pre-Procedure . Plan Follow-up Appointments: Return Appointment in 1 week. - Dr. Celine Ahr - Room 2 - 8/24 at 8:30 AM Anesthetic: Wound #1 Left,Medial Foot: (In clinic) Topical Lidocaine 4% applied to wound bed Cellular or Tissue Based Products: Wound #1 Left,Medial Foot: Cellular or Tissue Based Product applied to wound bed, secured with steri-strips, cover with Adaptic or Mepitel. (DO NOT REMOVE). - Oasis - DO NOT REMOVE OR GET WET Bathing/ Shower/ Hygiene: May shower with protection but do not get wound dressing(s) wet. WOUND #1: - Foot Wound Laterality: Left, Medial Cleanser: Soap and Water 1 x Per Week/15 Days Discharge Instructions: May shower and wash wound with dial antibacterial soap and water prior to dressing change. Cleanser: Wound Cleanser 1 x Per Week/15 Days Discharge Instructions: Cleanse the wound with wound cleanser prior to applying [name] clean dressing using gauze sponges, not tissue  or cotton balls. Prim Dressing: Oasis wound matrix 1 x Per Week/15 Days ary Secondary Dressing: Optifoam Non-Adhesive Dressing, 4x4 in 1 x Per Week/15 Days Discharge Instructions: Apply over primary dressing as directed. Secondary Dressing: Woven Gauze Sponges 2x2 in 1 x Per  Week/15 Days Discharge Instructions: Apply over primary dressing as directed. Secured With: Elastic Bandage 4 inch (ACE bandage) 1 x Per Week/15 Days Discharge Instructions: Secure with ACE bandage as directed. Secured With: [employer], 4.5x3.1 (in/yd) 1 x Per Week/15 Days Discharge Instructions: Secure with Kerlix as directed. Secured With: 7M Medipore Public affairs consultant Surgical T 2x10 (in/yd) 1 x Per Week/15 Days ape Discharge Instructions: Secure with tape as directed. 12/05/2021: Today, the wound has decreased in size considerably. There is [name] layer of eschar on the surface, but underneath the granulation tissue is very healthy. I used [name] curette to remove the eschar from the wound. After discussing the potential expense of another application of Oasis, the patient and her son expressed [name] desire to proceed. Oasis was applied in standard fashion, moistened with gauze and secured in place with Adaptic and Steri-Strips. [name] gauze sponge was used as [name] bolster. She will follow-up in 1 week. Electronic Signature(s) Signed: 12/05/2021 10:44:07 AM By: [name] Maudlin MD FACS Entered By: [name] on 12/05/2021 10:44:07 -------------------------------------------------------------------------------- HxROS Details Patient Name: Date of Service: [name], [name] 12/05/2021 9:15 [name] M Medical Record Number: [id number] Patient Account Number: [id number] Date of Birth/Sex: Treating RN: Aug 31, 1932 (88 y.o. [name] Primary Care Provider: Dettinger, Vonna [name] Other Clinician: Referring Provider: Treating Provider/Extender: [name] [name] in Treatment: 15 Information Obtained From Patient Eyes Medical History: Positive for: Cataracts Past Medical History Notes: macular degeneration Ear/Nose/Mouth/Throat Medical History: Past Medical History Notes: GERD, dysarthria Hematologic/Lymphatic Medical History: Past Medical History  Notes: dyslipidemia Cardiovascular Medical History: Positive for: Hypertension Past Medical History Notes: palpitations Musculoskeletal Medical History: Positive for: Rheumatoid Arthritis - hands and feet Past Medical History Notes: osteoporosis, unsteady gait Neurologic Medical History: Past Medical History Notes: TIA HBO Extended History Items Eyes: Cataracts Immunizations Pneumococcal Vaccine: Received Pneumococcal Vaccination: Yes Received Pneumococcal Vaccination On or After 60th Birthday: Yes Implantable Devices None Hospitalization / Surgery History Type of Hospitalization/Surgery cataract surgery Family and Social History Cancer: Yes - Siblings; Diabetes: No; Heart Disease: No; Hereditary Spherocytosis: No; Hypertension: No; Kidney Disease: No; Lung Disease: No; Seizures: No; Stroke: No; Thyroid Problems: No; Tuberculosis: No; Never smoker; Marital Status - Married; Alcohol Use: Never; Drug Use: No History; Caffeine Use: Daily; Financial Concerns: No; Food, Clothing or Shelter Needs: No; Support System Lacking: No; Transportation Concerns: No Electronic Signature(s) Signed: 12/05/2021 12:42:48 PM By: [name] Maudlin MD FACS Signed: 12/05/2021 3:33:24 PM By: [name] Entered By: [name] on 12/05/2021 10:42:30 -------------------------------------------------------------------------------- SuperBill Details Patient Name: Date of Service: [name], [name] 12/05/2021 Medical Record Number: [id number] Patient Account Number: [id number] Date of Birth/Sex: Treating RN: Dec 05, 1932 (88 y.o. [name] Primary Care Provider: Dettinger, Vonna [name] Other Clinician: Referring Provider: Treating Provider/Extender: [name] [name] in Treatment: 15 Diagnosis Coding ICD-10 Codes Code Description [phone] Non-pressure chronic ulcer of other part of left foot with bone involvement without evidence of necrosis S90.812D  Abrasion, left foot, subsequent encounter Facility Procedures CPT4 Code: 76160737 Description: T0626 -Oasis (Wound Matrix) per sq cm Modifier: Quantity: 10 CPT4 Code: 94854627 Description: 03500 - SKIN SUB GRAFT FACE/NK/HF/G ICD-10 Diagnosis Description L97.526 Non-pressure chronic ulcer of other part of left foot with bone involvement with Modifier:  out evidence of necr Quantity: 1 osis Physician Procedures : CPT4 Code Description Modifier [id number] - WC PHYS LEVEL 4 - EST PT 25 ICD-10 Diagnosis Description L97.526 Non-pressure chronic ulcer of other part of left foot with bone involvement without evidence of necro S90.812D Abrasion, left foot,  subsequent encounter Quantity: 1 sis : [id number] 15275 - WC PHYS SKIN SUB GRAFT FACE/NK/HF/G ICD-10 Diagnosis Description L97.526 Non-pressure chronic ulcer of other part of left foot with bone involvement without evidence of necro Quantity: 1 sis Electronic Signature(s) Signed: 12/05/2021 10:44:29 AM By: [name] Maudlin MD FACS Entered By: [name] on 12/05/2021 10:44:29

## 2021-12-11 ENCOUNTER — Encounter: Payer: Self-pay | Admitting: Infectious Diseases

## 2021-12-11 ENCOUNTER — Other Ambulatory Visit: Payer: Self-pay

## 2021-12-11 ENCOUNTER — Encounter: Payer: Self-pay | Admitting: Vascular Surgery

## 2021-12-11 ENCOUNTER — Ambulatory Visit (INDEPENDENT_AMBULATORY_CARE_PROVIDER_SITE_OTHER): Payer: Medicare Other | Admitting: Infectious Diseases

## 2021-12-11 ENCOUNTER — Ambulatory Visit (INDEPENDENT_AMBULATORY_CARE_PROVIDER_SITE_OTHER): Payer: Medicare Other | Admitting: Vascular Surgery

## 2021-12-11 VITALS — BP 120/73 | HR 80 | Temp 97.3°F | Ht <= 58 in | Wt 125.2 lb

## 2021-12-11 VITALS — BP 131/75 | HR 83 | Temp 97.8°F | Wt 126.0 lb

## 2021-12-11 DIAGNOSIS — I739 Peripheral vascular disease, unspecified: Secondary | ICD-10-CM | POA: Diagnosis not present

## 2021-12-11 DIAGNOSIS — Z5181 Encounter for therapeutic drug level monitoring: Secondary | ICD-10-CM

## 2021-12-11 DIAGNOSIS — L609 Nail disorder, unspecified: Secondary | ICD-10-CM

## 2021-12-11 DIAGNOSIS — M869 Osteomyelitis, unspecified: Secondary | ICD-10-CM | POA: Diagnosis not present

## 2021-12-11 NOTE — Progress Notes (Signed)
[facility] [place]  Patient name: [name] MRN: [id number] DOB: [birth year] Sex: female  REASON FOR CONSULT: Evaluation arterial flow left lower extremity with left foot wound  HPI: [name] is a 88 y.o. female, who is here today for evaluation.  She is here today with her son.  He is fluent in [place] and [place].  The patient speaks very little [place].  He initially presented with what appeared to be a pressure injury and ulceration over the medial aspect of her left first metatarsal head.  Has pictures on his telephone of progress.  She apparently had bone exposed at some time and MRI from 10/03/2021 suggested osteomyelitis of the first metatarsal head.  He has had continued wound care in [place] so underwent a 6-week course of antibiotics.  She has had a slow but progressive healing and according to her son this has accelerated over the last month.  He denies any arterial rest pain.  She does walk independently but requires walker and assistance.  Past Medical History:  Diagnosis Date   Arthritis of right foot    Hx: UTI (urinary tract infection)    Hypertension    Palpitations    Unsteady gait     Family History  Problem Relation Age of Onset   Heart attack Mother 30       Died in [place]   Healthy Daughter    Healthy Son    Healthy Son    Healthy Daughter    Allergic rhinitis Neg Hx    Angioedema Neg Hx    Asthma Neg Hx    Eczema Neg Hx    Immunodeficiency Neg Hx    Urticaria Neg Hx     SOCIAL HISTORY: Social History   Socioeconomic History   Marital status: Married    Spouse name: Not on file   Number of children: 4   Years of education: 4th grade   Highest education level: Not on file  Occupational History   Occupation: Homemaker  Tobacco Use   Smoking status: Never   Smokeless tobacco: Never  Vaping Use   Vaping Use: Never used  Substance and Sexual Activity   Alcohol use: No     Alcohol/week: 0.0 standard drinks of alcohol   Drug use: Never   Sexual activity: Not on file  Other Topics Concern   Not on file  Social History Narrative   Lives at home with son and husband.     1 cup coffee per day.   Right-handed.   Social Determinants of Health   Financial Resource Strain: Low Risk  (02/18/2021)   Overall Financial Resource Strain (CARDIA)    Difficulty of Paying Living Expenses: Not hard at all  Food Insecurity: No Food Insecurity (02/18/2021)   Hunger Vital Sign    Worried About Running Out of Food in the Last Year: Never true    Ran Out of Food in the Last Year: Never true  Transportation Needs: No Transportation Needs (02/18/2021)   PRAPARE - Hydrologist (Medical): No    Lack of Transportation (Non-Medical): No  Physical Activity: Insufficiently Active (02/18/2021)   Exercise Vital Sign    Days of Exercise per Week: 5 days    Minutes of Exercise per Session: 20 min  Stress: No Stress Concern Present (02/18/2021)   [place]    Feeling of Stress : Not at all  Social Connections: Moderately  Isolated (02/18/2021)   Social Connection and Isolation Panel [NHANES]    Frequency of Communication with Friends and Family: More than three times a week    Frequency of Social Gatherings with Friends and Family: More than three times a week    Attends Religious Services: Never    Marine scientist or Organizations: No    Attends Archivist Meetings: Never    Marital Status: Married  Human resources officer Violence: Not At Risk (02/18/2021)   Humiliation, Afraid, Rape, and Kick questionnaire    Fear of Current or Ex-Partner: No    Emotionally Abused: No    Physically Abused: No    Sexually Abused: No    Allergies  Allergen Reactions   Penicillin G Hives   Lisinopril Swelling    Tongue swelling    Current Outpatient Medications  Medication Sig  Dispense Refill   alendronate (FOSAMAX) 70 MG tablet TAKE 1 TABLET BY MOUTH EVERY 7 (SEVEN) DAYS. TAKE WITH A FULL GLASS OF WATER ON AN EMPTY STOMACH. 12 tablet 0   amLODipine (NORVASC) 5 MG tablet Take 1 tablet (5 mg total) by mouth daily. 90 tablet 3   bacitracin-polymyxin b (POLYSPORIN) ophthalmic ointment Place 1 application into both eyes 2 (two) times daily. apply to eye every 12 hours while awake 3.5 g 1   diclofenac (VOLTAREN) 75 MG EC tablet Take 1 tablet (75 mg total) by mouth 2 (two) times daily. 60 tablet 2   loratadine (CLARITIN) 10 MG tablet Take 1 tablet (10 mg total) by mouth daily. 90 tablet 3   Pollen Extracts (PROSTAT PO) Take by mouth. Liquid prostat     atorvastatin (LIPITOR) 40 MG tablet TAKE 1 TABLET BY MOUTH EVERYDAY AT BEDTIME (Patient not taking: Reported on 12/11/2021) 90 tablet 1   doxycycline (VIBRA-TABS) 100 MG tablet Take 1 tablet (100 mg total) by mouth 2 (two) times daily. (Patient not taking: Reported on 12/11/2021) 30 tablet 0   levofloxacin (LEVAQUIN) 750 MG tablet Take 1 tablet (750 mg total) by mouth daily. (Patient not taking: Reported on 12/11/2021) 15 tablet 0   No current facility-administered medications for this visit.    REVIEW OF SYSTEMS:  '[X]'$  denotes positive finding, '[ ]'$  denotes negative finding Cardiac  Comments:  Chest pain or chest pressure:    Shortness of breath upon exertion:    Short of breath when lying flat:    Irregular heart rhythm:        Vascular    Pain in calf, thigh, or hip brought on by ambulation:    Pain in feet at night that wakes you up from your sleep:     Blood clot in your veins:    Leg swelling:         Pulmonary    Oxygen at home:    Productive cough:     Wheezing:         Neurologic    Sudden weakness in arms or legs:     Sudden numbness in arms or legs:     Sudden onset of difficulty speaking or slurred speech:    Temporary loss of vision in one eye:     Problems with dizziness:         Gastrointestinal     Blood in stool:     Vomited blood:         Genitourinary    Burning when urinating:     Blood in urine:  Psychiatric    Major depression:         Hematologic    Bleeding problems:    Problems with blood clotting too easily:        Skin    Rashes or ulcers:        Constitutional    Fever or chills:      PHYSICAL EXAM: Vitals:   12/11/21 1034  BP: 120/73  Pulse: 80  Temp: (!) 97.3 F (36.3 C)  Weight: 125 lb 3.2 oz (56.8 kg)  Height: '4\' 10"'$  (1.473 m)    GENERAL: The patient is a well-nourished female, in no acute distress. The vital signs are documented above. CARDIOVASCULAR: She does not have palpable popliteal or pedal pulses bilaterally.  He has monophasic flow bilaterally by hand-held Doppler PULMONARY: There is good air exchange  MUSCULOSKELETAL: There are no major deformities or cyanosis. NEUROLOGIC: No focal weakness or paresthesias are detected. SKIN: Small 5 mm eschar over the medial aspect of her first metatarsal head PSYCHIATRIC: The patient has a normal affect.  DATA: Noninvasive studies from 11/06/2021 at [facility] reviewed noncompressible vessels therefore ankle arm indices is not obtainable.  She does have monophasic flow bilaterally and abnormal toe brachial indices bilaterally MEDICAL ISSUES: Had a long discussion with the patient and her son.  She clearly has at least moderate lower extremity arterial insufficiency.  Despite this and despite MRI evidence and physical exam evidence of osteomyelitis, the patient does appear to be healing her wound.  On physical exam it is nearly closed.  I explained that the typical standard would be arteriography for evaluation of revascularization options and even possible ray amputation of the great toe.  She clearly no indication for this since she apparently is healing with conservative local wound care and antibiotics.  Explained that there is possibility that she would have another flareup with more  infection in her foot and if so we would proceed with arteriography.  Assuming she goes on to healing, would not recommend any change in her current therapy.  We will see her again as indicated   Rosetta Posner, MD [facility] Tel [phone] Pager [phone]  Note: Portions of this report may have been transcribed using voice recognition software.  Every effort has been made to ensure accuracy; however, inadvertent computerized transcription errors may still be present.

## 2021-12-11 NOTE — Progress Notes (Addendum)
Patient Active Problem List   Diagnosis Date Noted   PAD (peripheral artery disease) ([place]) 11/13/2021   Language barrier 11/13/2021   Osteomyelitis ([place]) 10/16/2021   Medication monitoring encounter 10/16/2021   Penicillin allergy 10/16/2021   Dyslipidemia 01/24/2021   Osteoporosis 07/25/2020   Rheumatoid arthritis involving multiple sites with positive rheumatoid factor ([place]) 05/10/2018   Rheumatoid arthritis involving both hands with positive rheumatoid factor ([place]) 05/10/2018   Rheumatoid arthritis involving both feet with positive rheumatoid factor ([place]) 05/10/2018   GERD (gastroesophageal reflux disease) 02/05/2018   Early stage nonexudative age-related macular degeneration of both eyes 08/06/2017   Thyroid nodule 02/21/2015   Dysarthria 02/19/2015   HTN (hypertension) 02/19/2015   History of TIA (transient ischemic attack) 02/19/2015   Impaired weight bearing    Current Outpatient Medications on File Prior to Visit  Medication Sig Dispense Refill   alendronate (FOSAMAX) 70 MG tablet TAKE 1 TABLET BY MOUTH EVERY 7 (SEVEN) DAYS. TAKE WITH A FULL GLASS OF WATER ON AN EMPTY STOMACH. 12 tablet 0   amLODipine (NORVASC) 5 MG tablet Take 1 tablet (5 mg total) by mouth daily. 90 tablet 3   atorvastatin (LIPITOR) 40 MG tablet TAKE 1 TABLET BY MOUTH EVERYDAY AT BEDTIME (Patient not taking: Reported on 12/11/2021) 90 tablet 1   bacitracin-polymyxin b (POLYSPORIN) ophthalmic ointment Place 1 application into both eyes 2 (two) times daily. apply to eye every 12 hours while awake 3.5 g 1   diclofenac (VOLTAREN) 75 MG EC tablet Take 1 tablet (75 mg total) by mouth 2 (two) times daily. 60 tablet 2   doxycycline (VIBRA-TABS) 100 MG tablet Take 1 tablet (100 mg total) by mouth 2 (two) times daily. (Patient not taking: Reported on 12/11/2021) 30 tablet 0   levofloxacin (LEVAQUIN) 750 MG tablet Take 1 tablet (750 mg total) by mouth daily. (Patient not taking: Reported on 12/11/2021) 15 tablet 0    loratadine (CLARITIN) 10 MG tablet Take 1 tablet (10 mg total) by mouth daily. 90 tablet 3   Pollen Extracts (PROSTAT PO) Take by mouth. Liquid prostat     No current facility-administered medications on file prior to visit.    Subjective: 88 Y O Female with h/o RA, Osteoporosis, HTN, Dyslipidemia who is referred from wound care from evaluation and management of osteomyelitis. Patient is accompanied by her son who is helping with interpretation and they declined interpreter service.   She started having blister in the medial aspect of left great toe sometime in Feb when her shoes were rubbing against a bunion in this location that ultimately broke down to an ulcer. Seen by UC and was prescribed a course of PO bactrim. Her PCP followed up and recommended daily Xeroform gauze dressing changes. She started following up with wound care early May. 5/31 wound cx was done due to lack of progression of wound which grew out skin flora. 6/7 was started on doxycyline with an Xray 5/24: Soft tissue defect along the medial aspect of the first metatarsophalangeal joint with bony erosions along the medial head of the first metatarsal, concerning for osteomyelitis.  MRI left foot 6/15 Medial forefoot soft tissue ulcer with underlying osteomyelitis of the adjacent first metatarsal head. Adjacent soft tissue swelling without well-defined/drainable abscess on noncontrast MRI.  She was refilled another 4 weeks course of doxycyline on fu appt on 6/22 which she has been currently taking ( 3rd week) after MRI showed osteomyelitis. She has a small vac in her wound which gets changed  weekly with minimal serous drainage and seems to be clinically improving per son/patient.   Denies fevers, chills, sweats Denies nausea, vomiting and diarrhea Denies cough, chest pain and SOB.  Denies rashes or GU symptoms  Not on medications for RA. She does not know who diagnosed her with RA Denies h/o DM  H/o penicillin allergy,  when she got an injection with penicillin many years ago when she was pregnant and had hives. No face/lip swelling or SOB.  Denies smoking, alcohol and IVDU  11/13/21 Here for follow up Left great toe medial ulcer/osteomyelitis. Accompanied by her son who is helping with interpretation. Was taking linezolid and levofloxacin after last clinic visit. However, Linezolid had to be switched to doxycyline on 7/5 due to persistent nausea and vomiting. Lost around 10 lbs due to poor po intake that time. Nausea and vomiting seems to have improved since switching to doxycycline and she was also started on megestrol by wound care. Per son, she has gained 5 lbs back. Appetite is OK. Denies nausea, vomiting and diarrhea. Last seen by wound care 7/19 and wound is thought to be healing well. She does not have snap VAC anymore. Discussed lab work as well Vascular US of Lower extremities with concerns of vascular disease. They agree to be seen by Vascular surgery.   12/11/21 Accompanied by her son. Completed doxycyline and levofloxacin as instructed on 8/8. Denies fevers, chills, sweats. Denies any pain, tenderness and swelling/drainage from left medial foot ulcer. Saw Vascular this morning, no plans for intervention as wound is healing well already. She fell 2 weeks ago and twisted her left ankle, no concerns currently. She is closely following with wound care, last seen 8/17 " wound has decreased in size considerably. There is a layer of eschar on the surface, but underneath the granulation tissue is very healthy. Skin graft was also done". They will fu with wound care next week. Son is also concerned about long toe nails b/l.   Review of Systems: all systems reviewed with pertinent positives and negatives as listed above  Past Medical History:  Diagnosis Date   Arthritis of right foot    Hx: UTI (urinary tract infection)    Hypertension    Palpitations    Unsteady gait    Past Surgical History:  Procedure  Laterality Date   CATARACT EXTRACTION W/PHACO Right 01/01/2016   Procedure: CATARACT EXTRACTION PHACO AND INTRAOCULAR LENS PLACEMENT RIGHT EYE;  Surgeon: Rutherford Guys, MD;  Location: [facility];  Service: Ophthalmology;  Laterality: Right;  CDE: 9.69   CATARACT EXTRACTION W/PHACO Left 01/29/2016   Procedure: CATARACT EXTRACTION PHACO AND INTRAOCULAR LENS PLACEMENT ([facility]);  Surgeon: Rutherford Guys, MD;  Location: [facility];  Service: Ophthalmology;  Laterality: Left;  CDE: 9.32   None      Social History   Tobacco Use   Smoking status: Never   Smokeless tobacco: Never  Vaping Use   Vaping Use: Never used  Substance Use Topics   Alcohol use: No    Alcohol/week: 0.0 standard drinks of alcohol   Drug use: Never    Family History  Problem Relation Age of Onset   Heart attack Mother 33       Died in [place]   Healthy Daughter    Healthy Son    Healthy Son    Healthy Daughter    Allergic rhinitis Neg Hx    Angioedema Neg Hx    Asthma Neg Hx    Eczema Neg Hx  Immunodeficiency Neg Hx    Urticaria Neg Hx     Allergies  Allergen Reactions   Penicillin G Hives   Lisinopril Swelling    Tongue swelling    Health Maintenance  Topic Date Due   URINE MICROALBUMIN  Never done   Zoster Vaccines- Shingrix (1 of 2) Never done   COVID-19 Vaccine (4 - Moderna series) 06/20/2020   Pneumonia Vaccine 30+ Years old (2 - PPSV23 or PCV20) 07/25/2021   INFLUENZA VACCINE  11/19/2021   DEXA SCAN  01/22/2022   TETANUS/TDAP  01/17/2027   HPV VACCINES  Aged Out    Objective: BP 131/75   Pulse 83   Temp 97.8 F (36.6 C) (Temporal)   Wt 126 lb (57.2 kg)   BMI 26.33 kg/m   Physical Exam Constitutional:      Appearance: Normal appearance.  HENT:     Head: Normocephalic and atraumatic.      Mouth: Mucous membranes are moist.  Eyes:    Conjunctiva/sclera: Conjunctivae normal.     Pupils:   Cardiovascular:     Rate and Rhythm: Normal rate and regular rhythm.     Heart sounds:  Pulmonary:      Effort: Pulmonary effort is normal.     Breath sounds:    Abdominal:     General: Non distended     Palpations: soft.   Musculoskeletal:        General: Normal range of motion.  Left Foot   Left medial foot ulcer with no active drainage/swelling/surrounding redness/warmth, overall improving  Bilateral long and thick toe nails   Skin:    General: Skin is warm and dry.     Comments:  Neurological:     General: grossly non focal     Mental Status: awake, alert and oriented to person, place, and time.   Psychiatric:        Mood and Affect: Mood normal.   Lab Results Lab Results  Component Value Date   WBC 7.0 11/06/2021   HGB 12.5 11/06/2021   HCT 37.6 11/06/2021   MCV 88.7 11/06/2021   PLT 348 11/06/2021    Lab Results  Component Value Date   CREATININE 0.86 11/13/2021   BUN 21 11/13/2021   NA 137 11/13/2021   K 3.9 11/13/2021   CL 105 11/13/2021   CO2 24 11/13/2021    Lab Results  Component Value Date   ALT 10 01/24/2021   AST 20 01/24/2021   ALKPHOS 75 01/24/2021   BILITOT 0.3 01/24/2021    Lab Results  Component Value Date   CHOL 287 (H) 01/24/2021   HDL 35 (L) 01/24/2021   LDLCALC 204 (H) 01/24/2021   TRIG 242 (H) 01/24/2021   CHOLHDL 8.2 (H) 01/24/2021   Lab Results  Component Value Date   LABRPR Non Reactive 06/27/2015   No results found for: "HIV1RNAQUANT", "HIV1RNAVL", "CD4TABS"   Microbiology  Results for orders placed or performed during the hospital encounter of 02/21/15  MRSA PCR Screening     Status: None   Collection Time: 02/22/15 12:37 AM   Specimen: Nasal Mucosa; Nasopharyngeal  Result Value Ref Range Status   MRSA by PCR NEGATIVE NEGATIVE Final    Comment:        The GeneXpert MRSA Assay (FDA approved for NASAL specimens only), is one component of a comprehensive MRSA colonization surveillance program. It is not intended to diagnose MRSA infection nor to guide or monitor treatment for MRSA infections.  Imaging No results found.   Problem List Items Addressed This Visit       Musculoskeletal and Integument   Osteomyelitis (HCC)   Nail abnormality - Primary   Relevant Orders   Ambulatory referral to Podiatry     Other   Medication monitoring encounter  '  Assessment/Plan Left medial forefoot ulcer Osteomyelitis of left 1st metatarsal head  Completed prolonged course of antibiotics on 11/26/21. No signs of infection today  Fu with wound care   PAD  Following with Vascular surgery, no plans for vascular intervention  Bilateral long nails ? Onychomycosis Referred to Podiatry  I have personally spent 41 minutes involved in face-to-face and non-face-to-face activities for this patient on the day of the visit including counseling of the patient and coordination of care.   Wilber Oliphant, [facility] 12/11/2021, 1:38 PM

## 2021-12-12 ENCOUNTER — Encounter (HOSPITAL_BASED_OUTPATIENT_CLINIC_OR_DEPARTMENT_OTHER): Payer: Medicare Other | Admitting: General Surgery

## 2021-12-12 DIAGNOSIS — L97522 Non-pressure chronic ulcer of other part of left foot with fat layer exposed: Secondary | ICD-10-CM | POA: Diagnosis not present

## 2021-12-12 DIAGNOSIS — L97526 Non-pressure chronic ulcer of other part of left foot with bone involvement without evidence of necrosis: Secondary | ICD-10-CM | POA: Diagnosis not present

## 2021-12-12 DIAGNOSIS — Z09 Encounter for follow-up examination after completed treatment for conditions other than malignant neoplasm: Secondary | ICD-10-CM | POA: Diagnosis not present

## 2021-12-12 DIAGNOSIS — M069 Rheumatoid arthritis, unspecified: Secondary | ICD-10-CM | POA: Diagnosis not present

## 2021-12-12 DIAGNOSIS — I10 Essential (primary) hypertension: Secondary | ICD-10-CM | POA: Diagnosis not present

## 2021-12-12 DIAGNOSIS — L89893 Pressure ulcer of other site, stage 3: Secondary | ICD-10-CM | POA: Diagnosis not present

## 2021-12-12 DIAGNOSIS — S90812A Abrasion, left foot, initial encounter: Secondary | ICD-10-CM | POA: Diagnosis not present

## 2021-12-16 NOTE — Progress Notes (Signed)
[name], [name] ([id number]) Visit Report for 12/12/2021 Chief Complaint Document Details Patient Name: Date of Service: [name], [name] 12/12/2021 8:30 [name] M Medical Record Number: [id number] Patient Account Number: [id number] Date of Birth/Sex: Treating RN: 1932/11/21 (88 y.o. [name] Primary Care Provider: Dettinger, Vonna [name] Other Clinician: Referring Provider: Treating Provider/Extender: [name] [name] in Treatment: 16 Information Obtained from: Patient Chief Complaint Patient is at the clinic for treatment of an open ulcer on her left medial first metatarsal head, secondary to friction and pressure. Electronic Signature(s) Signed: 12/12/2021 9:10:47 AM By: [name] Maudlin MD FACS Entered By: [name] on 12/12/2021 09:10:47 -------------------------------------------------------------------------------- Debridement Details Patient Name: Date of Service: [name], [name] 12/12/2021 8:30 [name] M Medical Record Number: [id number] Patient Account Number: [id number] Date of Birth/Sex: Treating RN: 03/24/1933 (88 y.o. [name], [place] Primary Care Provider: Dettinger, Vonna [name] Other Clinician: Referring Provider: Treating Provider/Extender: [name] [name] in Treatment: 16 Debridement Performed for Assessment: Wound #1 Left,Medial Foot Performed By: Physician [name] Maudlin, MD Debridement Type: Debridement Level of Consciousness (Pre-procedure): Awake and Alert Pre-procedure Verification/Time Out Yes - 09:01 Taken: Start Time: 09:02 Pain Control: Lidocaine 5% topical ointment T Area Debrided (L x W): otal 0.3 (cm) x 0.3 (cm) = 0.09 (cm) Tissue and other material debrided: Non-Viable, Eschar Level: Non-Viable Tissue Debridement Description: Selective/Open Wound Instrument: Curette Bleeding: Minimum Hemostasis Achieved: Pressure Procedural Pain: 0 Post Procedural Pain: 0 Response to Treatment: Procedure was  tolerated well Level of Consciousness (Post- Awake and Alert procedure): Post Debridement Measurements of Total Wound Length: (cm) 0.3 Stage: Category/Stage III Width: (cm) 0.3 Depth: (cm) 0.1 Volume: (cm) 0.007 Character of Wound/Ulcer Post Debridement: Improved Post Procedure Diagnosis Same as Pre-procedure Electronic Signature(s) Signed: 12/12/2021 10:03:26 AM By: [name] Maudlin MD FACS Signed: 12/13/2021 7:35:53 AM By: [name] East RN Entered By: [name] on 12/12/2021 09:06:49 -------------------------------------------------------------------------------- HPI Details Patient Name: Date of Service: [name], [name] 12/12/2021 8:30 [name] M Medical Record Number: [id number] Patient Account Number: [id number] Date of Birth/Sex: Treating RN: 04-27-1932 (88 y.o. [name] Primary Care Provider: Dettinger, Vonna [name] Other Clinician: Referring Provider: Treating Provider/Extender: [name] [name] in Treatment: 16 History of Present Illness HPI Description: ADMISSION 08/20/2021 This is an 88 year old Spanish-speaking patient who is being evaluated for an ulcer on the medial aspect of the left first metatarsal head. She says that in February, her shoes were rubbing against [name] bunion in this location and she first developed [name] blister that ultimately broke down to an ulcer. She is not [name] diabetic and she does not smoke. She does have some high blood pressure as well as rheumatoid arthritis. ABI in clinic was noncompressible. When the wound first appeared, she was seen in urgent care and was started on Bactrim. Her PCP followed up and recommended daily Xeroform gauze dressing changes. Initially, it sounds like the wound was quite painful but it is now quite improved. Apparently she had been changing the dressing twice [name] day at [name] minimum, and per her PCP's note she may be scrubbing it quite aggressively up to 4 times [name] day. Today, the wound is clean, but  there is perimeter of undermining. I am not entirely sure that I am probing bone but we are certainly very close to it. The surface is [name] little bit pale but otherwise healthy. No odor or concern for infection. 08/27/2021: The wound is about the same today. There is [name] little bit more undermining underneath the  callus that is formed around the perimeter. The wound surface is pink and moist. No odor or significant drainage. 09/04/2021: The wound is the same size today. It is very clean and the degree of undermining is about the same. The surface is [name] little bit hypertrophic. We have been using silver collagen and the patient's son reports that they have been making [name] concerted effort to tuck the dressing into the undermined area of tissue. No concern for infection. 09/11/2021: The wound is really unchanged with perhaps [name] little bit less undermining. It is clean and they have been using silver collagen, taking care to tuck it into the undermined area. I thought we had applied for snap VAC approval last week, but for some reason we are unable to find this. She does say that the degree of pain she is experiencing is less and that it sometimes just throbs at night but is better than previously. 09/18/2021: Last week, due to lack of progression with the wound, I took [name] culture that grew out skin flora but I also got an x-ray that was suggestive of osteomyelitis. She continues to complain of pain and throbbing at night but nothing throughout the day. She has been approved for snap VAC use with [name] $70- $100 co-pay with each application. The wound itself really looks no different today. 09/25/2021: I initiated doxycycline last week and she has been taking this. We also applied the snap VAC. The wound looks markedly better today with significant closure of the undermined portion. She continues to have some throbbing pain at night, but otherwise is doing well. We ordered an MRI to get [name] more definitive answer regarding  the possibility of osteomyelitis, but they have not yet received an appointment for this. 10/02/2021: She had her MRI just prior to coming to clinic so I do not yet have any results. She continues to have throbbing pain at night. The wound itself is [name] bit smaller. There is some periwound moisture with [name] little bit of slough but the undermined area is almost closed and I am unable to probe to bone. 10/10/2021: Unfortunately, the MRI done last week showed evidence of osteomyelitis. I renewed her doxycycline prescription for an additional 4 weeks. She has an appointment with infectious disease next week. Her primary care provider prescribed diclofenac which seems to be helping with her throbbing nighttime pain. The wound is about the same size today but there is no undermining present whatsoever. The surface is healthy with just [name] small amount of slough on the most proximal aspect of the wound. 10/16/2021: T oday, she is complaining that for the last 3 days, her foot has been more swollen and tender. She does have some visible swelling present and her foot feels warmer than normal. There is some erythema surrounding her wound and over the dorsum of her foot. She says that when she takes [name] step, the ball of her foot hurts more than it has. She has been taking doxycycline and seems to be tolerating this well. Her son also purchased some Pro-Stat and she has been taking this, although she does not particularly care for the taste. She has an appointment with infectious disease this morning after our visit. The wound itself is [name] little bit smaller. The periwound skin is slightly macerated but there is no undermining and there is granulation tissue on the surface. No odor or purulent drainage. 10/23/2021: She saw infectious disease last week and they initiated linezolid and levofloxacin. Unfortunately, this has resulted  in [name] lot of nausea and vomiting on the patient's part. She is not taking either medication with  any food and this seems to be part of the issue. Her overall oral intake remains quite poor, despite trying to get Pro-Stat and other agents into her system. She did start taking [name] probiotic but subsequently stopped taking the antibiotic so its not clear if this has had any beneficial effect or not. She is also scheduled to have formal ABIs performed in [name] couple of weeks, again per infectious disease. Her wound is [name] little bit smaller today and has [name] clean surface with just [name] little bit of biofilm and slough accumulation. The undermining and tunneling remain closed. 10/30/2021: Her son contacted infectious disease and they adjusted her antibiotic regimen. She is tolerating the new antibiotics without nausea or vomiting. She feels like the swelling and pain in her foot has diminished since initiating antibiotic therapy. The wound is nearly flush with the surrounding skin and has granulation tissue with just [name] little bit of slough. 11/06/2021: The snap VAC fell off last Friday. In the interim, they have been applying Prisma silver collagen to the site. The wound surface looks fairly robust and pink. There is [name] thin layer of slough on the wound surface. No undermining present. Swelling and warmth has improved. The patient continues to struggle with poor appetite; as result of not eating well, she is also having difficulty tolerating her oral antibiotic regimen. She is scheduled to have ABIs performed later this morning. 11/19/2021: ABIs were done 2 weeks ago. Although she has normal ABIs, both TBI's were abnormal. 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 She saw infectious disease [name] week ago; she will complete 2 more weeks of antibiotics from the time of that visit. She has responded well to the Megace that I prescribed and her appetite has improved. We discontinued the wound VAC and have been applying endoform. T  oday, the wound is markedly improved. There is good perimeter epithelialization and contracture of the wound. She has robust granulation tissue forming and [name] minimum of slough. 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 11/28/2021: Last week we applied Grafix plus. T oday, although there is [name] hard layer of eschar overlying the wound, underneath this, it is smaller and has lovely robust granulation tissue. Bone is completely covered. She continues to complain of pulsatile pain at night but this seems to be managed fairly well with the meloxicam and Tylenol that she has been taking. Her appetite continues to be good on Megace. She will complete her oral antibiotics after today's doses. She has an appointment with vascular surgery to address her abnormal ABIs coming up on 23 August. She has received approval for [facility]. 12/05/2021: Today, the wound has decreased in size considerably. There is [name] layer of eschar on the surface, but underneath the granulation tissue is very healthy. Last week, I was unaware that she has [name] 20% co-pay with [facility], as she has not yet met her out-of-pocket maximum. I discussed this with the patient and her son and they expressed [name] desire to proceed with [facility] #2 application today anyway. 12/12/2021: Her wound is nearly closed. There is [name] layer of eschar over the surface, underneath which there are just [name] couple of tiny openings in the skin. She met with vascular surgery and infectious disease yesterday. ID said that she no longer needed antibiotics and  vascular surgery felt that as long as she continued to heal, no intervention was necessary, but if she had [name] recurrence of her wound then an aortogram would be warranted. Electronic Signature(s) Signed: 12/12/2021 9:11:43 AM By: [name] Maudlin MD FACS Entered By: [name] on 12/12/2021 09:11:43 -------------------------------------------------------------------------------- Physical Exam Details Patient Name: Date  of Service: [name], [name] 12/12/2021 8:30 [name] M Medical Record Number: [id number] Patient Account Number: [id number] Date of Birth/Sex: Treating RN: 1932-11-12 (88 y.o. [name] Primary Care Provider: Dettinger, Vonna [name] Other Clinician: Referring Provider: Treating Provider/Extender: [name] [name] in Treatment: 16 Constitutional Slightly hypertensive. . . . No acute distress.Marland Kitchen Respiratory Normal work of breathing on room air.. Notes 12/12/2021: Her wound is nearly closed. There is [name] layer of eschar over the surface, underneath which there are just [name] couple of tiny openings in the skin. Electronic Signature(s) Signed: 12/12/2021 9:13:48 AM By: [name] Maudlin MD FACS Entered By: [name] on 12/12/2021 09:13:48 -------------------------------------------------------------------------------- Physician Orders Details Patient Name: Date of Service: [name], [name] 12/12/2021 8:30 [name] M Medical Record Number: [id number] Patient Account Number: [id number] Date of Birth/Sex: Treating RN: January 24, 1933 (88 y.o. 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 - Dr. Celine Ahr - Room 4- Return [name] Wednesday 8/30 at 8:00 AM Anesthetic Wound #1 Left,Medial Foot (In clinic) Topical Lidocaine 5% applied to wound bed Bathing/ Shower/ Hygiene May shower with protection but do not get wound dressing(s) wet. Wound Treatment Wound #1 - Foot Wound Laterality: Left, Medial Cleanser: Soap and Water Every Other Day/15 Days Discharge Instructions:  May shower and wash wound with dial antibacterial soap and water prior to dressing change. Cleanser: Wound Cleanser Every Other Day/15 Days Discharge Instructions: Cleanse the wound with wound cleanser prior to applying [name] clean dressing using gauze sponges, not tissue or cotton balls. Peri-Wound Care: Sween Lotion (Moisturizing lotion) Every Other Day/15 Days Discharge Instructions: Apply moisturizing lotion as directed Prim Dressing: Promogran Prisma Matrix, 4.34 (sq in) (silver collagen) Every Other Day/15 Days ary Discharge Instructions: Moisten collagen with saline or hydrogel Secondary Dressing: Optifoam Non-Adhesive Dressing, 4x4 in Every Other Day/15 Days Discharge Instructions: Apply over primary dressing as directed. Secondary Dressing: Woven Gauze Sponges 2x2 in Every Other Day/15 Days Discharge Instructions: Apply over primary dressing as directed. Secured With: Elastic Bandage 4 inch (ACE bandage) Every Other Day/15 Days Discharge Instructions: Secure with ACE bandage as directed. Secured With: [employer], 4.5x3.1 (in/yd) Every Other Day/15 Days Discharge Instructions: Secure with Kerlix as directed. Secured With: 57M Medipore Public affairs consultant Surgical T 2x10 (in/yd) Every Other Day/15 Days ape Discharge Instructions: Secure with tape as directed. Electronic Signature(s) Signed: 12/12/2021 9:15:36 AM By: [name] Maudlin MD FACS Entered By: [name] on 12/12/2021 09:15:36 -------------------------------------------------------------------------------- Problem List Details Patient Name: Date of Service: [name], [name] 12/12/2021 8:30 [name] M Medical Record Number: [id number] Patient Account Number: [id number] Date of Birth/Sex: Treating RN: 1932/06/04 (88 y.o. [name] Primary Care Provider: Dettinger, Vonna [name] Other Clinician: Referring Provider: Treating Provider/Extender: [name] [name] in Treatment: 16 Active  Problems ICD-10 Encounter Code Description Active Date MDM Diagnosis L97.526 Non-pressure chronic ulcer of other  part of left foot with bone involvement 08/20/2021 No Yes without evidence of necrosis S90.812D Abrasion, left foot, subsequent encounter 11/06/2021 No Yes Inactive Problems Resolved Problems Electronic Signature(s) Signed: 12/12/2021 10:03:26 AM By: [name] Maudlin MD FACS Signed: 12/13/2021 7:35:53 AM By: [name] East RN Previous Signature: 12/12/2021 9:10:37 AM Version By: [name] Maudlin MD FACS Entered By: [name] on 12/12/2021 09:24:19 -------------------------------------------------------------------------------- Progress Note Details Patient Name: Date of Service: [name], [name] 12/12/2021 8:30 [name] M Medical Record Number: [id number] Patient Account Number: [id number] Date of Birth/Sex: Treating RN: [birth year] (88 y.o. [name] Primary Care Provider: Dettinger, Vonna [name] Other Clinician: Referring Provider: Treating Provider/Extender: [name] [name] in Treatment: 16 Subjective Chief Complaint Information obtained from Patient Patient is at the clinic for treatment of an open ulcer on her left medial first metatarsal head, secondary to friction and pressure. History of Present Illness (HPI) ADMISSION 08/20/2021 This is an 88 year old Spanish-speaking patient who is being evaluated for an ulcer on the medial aspect of the left first metatarsal head. She says that in February, her shoes were rubbing against [name] bunion in this location and she first developed [name] blister that ultimately broke down to an ulcer. She is not [name] diabetic and she does not smoke. She does have some high blood pressure as well as rheumatoid arthritis. ABI in clinic was noncompressible. When the wound first appeared, she was seen in urgent care and was started on Bactrim. Her PCP followed up and recommended daily Xeroform gauze dressing changes. Initially,  it sounds like the wound was quite painful but it is now quite improved. Apparently she had been changing the dressing twice [name] day at [name] minimum, and per her PCP's note she may be scrubbing it quite aggressively up to 4 times [name] day. Today, the wound is clean, but there is perimeter of undermining. I am not entirely sure that I am probing bone but we are certainly very close to it. The surface is [name] little bit pale but otherwise healthy. No odor or concern for infection. 08/27/2021: The wound is about the same today. There is [name] little bit more undermining underneath the callus that is formed around the perimeter. The wound surface is pink and moist. No odor or significant drainage. 09/04/2021: The wound is the same size today. It is very clean and the degree of undermining is about the same. The surface is [name] little bit hypertrophic. We have been using silver collagen and the patient's son reports that they have been making [name] concerted effort to tuck the dressing into the undermined area of tissue. No concern for infection. 09/11/2021: The wound is really unchanged with perhaps [name] little bit less undermining. It is clean and they have been using silver collagen, taking care to tuck it into the undermined area. I thought we had applied for snap VAC approval last week, but for some reason we are unable to find this. She does say that the degree of pain she is experiencing is less and that it sometimes just throbs at night but is better than previously. 09/18/2021: Last week, due to lack of progression with the wound, I took [name] culture that grew out skin flora but I also got an x-ray that was suggestive of osteomyelitis. She continues to complain of pain and throbbing at night but nothing throughout the day. She has been approved for snap VAC use with [name] $70- $100 co-pay with each application. The wound itself really looks no different today.  09/25/2021: I initiated doxycycline last week and she has been taking this.  We also applied the snap VAC. The wound looks markedly better today with significant closure of the undermined portion. She continues to have some throbbing pain at night, but otherwise is doing well. We ordered an MRI to get [name] more definitive answer regarding the possibility of osteomyelitis, but they have not yet received an appointment for this. 10/02/2021: She had her MRI just prior to coming to clinic so I do not yet have any results. She continues to have throbbing pain at night. The wound itself is [name] bit smaller. There is some periwound moisture with [name] little bit of slough but the undermined area is almost closed and I am unable to probe to bone. 10/10/2021: Unfortunately, the MRI done last week showed evidence of osteomyelitis. I renewed her doxycycline prescription for an additional 4 weeks. She has an appointment with infectious disease next week. Her primary care provider prescribed diclofenac which seems to be helping with her throbbing nighttime pain. The wound is about the same size today but there is no undermining present whatsoever. The surface is healthy with just [name] small amount of slough on the most proximal aspect of the wound. 10/16/2021: Today, she is complaining that for the last 3 days, her foot has been more swollen and tender. She does have some visible swelling present and her foot feels warmer than normal. There is some erythema surrounding her wound and over the dorsum of her foot. She says that when she takes [name] step, the ball of her foot hurts more than it has. She has been taking doxycycline and seems to be tolerating this well. Her son also purchased some Pro-Stat and she has been taking this, although she does not particularly care for the taste. She has an appointment with infectious disease this morning after our visit. The wound itself is [name] little bit smaller. The periwound skin is slightly macerated but there is no undermining and there is granulation tissue on the  surface. No odor or purulent drainage. 10/23/2021: She saw infectious disease last week and they initiated linezolid and levofloxacin. Unfortunately, this has resulted in [name] lot of nausea and vomiting on the patient's part. She is not taking either medication with any food and this seems to be part of the issue. Her overall oral intake remains quite poor, despite trying to get Pro-Stat and other agents into her system. She did start taking [name] probiotic but subsequently stopped taking the antibiotic so its not clear if this has had any beneficial effect or not. She is also scheduled to have formal ABIs performed in [name] couple of weeks, again per infectious disease. Her wound is [name] little bit smaller today and has [name] clean surface with just [name] little bit of biofilm and slough accumulation. The undermining and tunneling remain closed. 10/30/2021: Her son contacted infectious disease and they adjusted her antibiotic regimen. She is tolerating the new antibiotics without nausea or vomiting. She feels like the swelling and pain in her foot has diminished since initiating antibiotic therapy. The wound is nearly flush with the surrounding skin and has granulation tissue with just [name] little bit of slough. 11/06/2021: The snap VAC fell off last Friday. In the interim, they have been applying Prisma silver collagen to the site. The wound surface looks fairly robust and pink. There is [name] thin layer of slough on the wound surface. No undermining present. Swelling and warmth has improved. The patient continues to  struggle with poor appetite; as result of not eating well, she is also having difficulty tolerating her oral antibiotic regimen. She is scheduled to have ABIs performed later this morning. 11/19/2021: ABIs were done 2 weeks ago. Although she has normal ABIs, both TBI's were abnormal. 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 She  saw infectious disease [name] week ago; she will complete 2 more weeks of antibiotics from the time of that visit. She has responded well to the Megace that I prescribed and her appetite has improved. We discontinued the wound VAC and have been applying endoform. T oday, the wound is markedly improved. There is good perimeter epithelialization and contracture of the wound. She has robust granulation tissue forming and [name] minimum of slough. 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 11/28/2021: Last week we applied Grafix plus. T oday, although there is [name] hard layer of eschar overlying the wound, underneath this, it is smaller and has lovely robust granulation tissue. Bone is completely covered. She continues to complain of pulsatile pain at night but this seems to be managed fairly well with the meloxicam and Tylenol that she has been taking. Her appetite continues to be good on Megace. She will complete her oral antibiotics after today's doses. She has an appointment with vascular surgery to address her abnormal ABIs coming up on 23 August. She has received approval for Oasis. 12/05/2021: Today, the wound has decreased in size considerably. There is [name] layer of eschar on the surface, but underneath the granulation tissue is very healthy. Last week, I was unaware that she has [name] 20% co-pay with Oasis, as she has not yet met her out-of-pocket maximum. I discussed this with the patient and her son and they expressed [name] desire to proceed with Oasis #2 application today anyway. 12/12/2021: Her wound is nearly closed. There is [name] layer of eschar over the surface, underneath which there are just [name] couple of tiny openings in the skin. She met with vascular surgery and infectious disease yesterday. ID said that she no longer needed antibiotics and vascular surgery felt that as long as she continued to heal, no intervention was necessary, but if she had [name] recurrence of her wound then an aortogram would be  warranted. Patient History Information obtained from Patient. Family History Cancer - Siblings, No family history of Diabetes, Heart Disease, Hereditary Spherocytosis, Hypertension, Kidney Disease, Lung Disease, Seizures, Stroke, Thyroid Problems, Tuberculosis. Social History Never smoker, Marital Status - Married, Alcohol Use - Never, Drug Use - No History, Caffeine Use - Daily. Medical History Eyes Patient has history of Cataracts Cardiovascular Patient has history of Hypertension Musculoskeletal Patient has history of Rheumatoid Arthritis - hands and feet Hospitalization/Surgery History - cataract surgery. Medical [name] Surgical History Notes nd Eyes macular degeneration Ear/Nose/Mouth/Throat GERD, dysarthria Hematologic/Lymphatic dyslipidemia Cardiovascular palpitations Musculoskeletal osteoporosis, unsteady gait Neurologic TIA Objective Constitutional Slightly hypertensive. No acute distress.. Vitals Time Taken: 8:45 AM, Height: 60 in, Weight: 119 lbs, BMI: 23.2, Temperature: 98.3 F, Pulse: 73 bpm, Respiratory Rate: 18 breaths/min, Blood Pressure: 142/62 mmHg. Respiratory Normal work of breathing on room air.. General Notes: 12/12/2021: Her wound is nearly closed. There is [name] layer of eschar over the surface, underneath which there are just [name] couple of tiny openings in the skin. Integumentary (Hair, Skin) Wound #1 status is Open. Original cause of wound was Gradually Appeared. The date acquired was: 05/22/2021. The wound has been  in treatment 16 weeks. The wound is located on the Left,Medial Foot. The wound measures 0.3cm length x 0.3cm width x 0.1cm depth; 0.071cm^2 area and 0.007cm^3 volume. There is Fat Layer (Subcutaneous Tissue) exposed. There is no tunneling or undermining noted. There is [name] medium amount of serous drainage noted. The wound margin is well defined and not attached to the wound base. There is large (67-100%) red granulation within the wound bed. There is  [name] small (1-33%) amount of necrotic tissue within the wound bed including Eschar. Assessment Active Problems ICD-10 Non-pressure chronic ulcer of other part of left foot with bone involvement without evidence of necrosis Abrasion, left foot, subsequent encounter Procedures Wound #1 Pre-procedure diagnosis of Wound #1 is [name] Pressure Ulcer located on the Left,Medial Foot . There was [name] Selective/Open Wound Non-Viable Tissue Debridement with [name] total area of 0.09 sq cm performed by [name] Maudlin, MD. With the following instrument(s): Curette to remove Non-Viable tissue/material. Material removed includes Eschar after achieving pain control using Lidocaine 5% topical ointment. No specimens were taken. [name] time out was conducted at 09:01, prior to the start of the procedure. [name] Minimum amount of bleeding was controlled with Pressure. The procedure was tolerated well with [name] pain level of 0 throughout and [name] pain level of 0 following the procedure. Post Debridement Measurements: 0.3cm length x 0.3cm width x 0.1cm depth; 0.007cm^3 volume. Post debridement Stage noted as Category/Stage III. Character of Wound/Ulcer Post Debridement is improved. Post procedure Diagnosis Wound #1: Same as Pre-Procedure Plan Follow-up Appointments: Return Appointment in 1 week. - Dr. Celine Ahr - Room 4- Wednesday 8/30 at 8:00 AM Anesthetic: Wound #1 Left,Medial Foot: (In clinic) Topical Lidocaine 5% applied to wound bed Bathing/ Shower/ Hygiene: May shower with protection but do not get wound dressing(s) wet. WOUND #1: - Foot Wound Laterality: Left, Medial Cleanser: Soap and Water Every Other Day/15 Days Discharge Instructions: May shower and wash wound with dial antibacterial soap and water prior to dressing change. Cleanser: Wound Cleanser Every Other Day/15 Days Discharge Instructions: Cleanse the wound with wound cleanser prior to applying [name] clean dressing using gauze sponges, not tissue or cotton balls. Peri-Wound  Care: Sween Lotion (Moisturizing lotion) Every Other Day/15 Days Discharge Instructions: Apply moisturizing lotion as directed Prim Dressing: Promogran Prisma Matrix, 4.34 (sq in) (silver collagen) Every Other Day/15 Days ary Discharge Instructions: Moisten collagen with saline or hydrogel Secondary Dressing: Optifoam Non-Adhesive Dressing, 4x4 in Every Other Day/15 Days Discharge Instructions: Apply over primary dressing as directed. Secondary Dressing: Woven Gauze Sponges 2x2 in Every Other Day/15 Days Discharge Instructions: Apply over primary dressing as directed. Secured With: Elastic Bandage 4 inch (ACE bandage) Every Other Day/15 Days Discharge Instructions: Secure with ACE bandage as directed. Secured With: [employer], 4.5x3.1 (in/yd) Every Other Day/15 Days Discharge Instructions: Secure with Kerlix as directed. Secured With: 8M Medipore Public affairs consultant Surgical T 2x10 (in/yd) Every Other Day/15 Days ape Discharge Instructions: Secure with tape as directed. 12/12/2021: Her wound is nearly closed. There is [name] layer of eschar over the surface, underneath which there are just [name] couple of tiny openings in the skin. I used [name] curette to debride the eschar off of the wound. At this point, I do not think the utility of another application of Oasis would outweigh the cost to the patient. We will apply Prisma silver collagen instead. I anticipate this wound will be closed next week. I will see her then. Electronic Signature(s) Signed: 12/12/2021 9:16:18 AM By: [name] Maudlin MD FACS  Entered By: [name] on 12/12/2021 09:16:18 -------------------------------------------------------------------------------- HxROS Details Patient Name: Date of Service: [name], [name] 12/12/2021 8:30 [name] M Medical Record Number: [id number] Patient Account Number: [id number] Date of Birth/Sex: Treating RN: 08-27-1932 (88 y.o. [name] Primary Care Provider: Dettinger, Vonna [name] Other  Clinician: Referring Provider: Treating Provider/Extender: [name] [name] in Treatment: 16 Information Obtained From Patient Eyes Medical History: Positive for: Cataracts Past Medical History Notes: macular degeneration Ear/Nose/Mouth/Throat Medical History: Past Medical History Notes: GERD, dysarthria Hematologic/Lymphatic Medical History: Past Medical History Notes: dyslipidemia Cardiovascular Medical History: Positive for: Hypertension Past Medical History Notes: palpitations Musculoskeletal Medical History: Positive for: Rheumatoid Arthritis - hands and feet Past Medical History Notes: osteoporosis, unsteady gait Neurologic Medical History: Past Medical History Notes: TIA HBO Extended History Items Eyes: Cataracts Immunizations Pneumococcal Vaccine: Received Pneumococcal Vaccination: Yes Received Pneumococcal Vaccination On or After 60th Birthday: Yes Implantable Devices None Hospitalization / Surgery History Type of Hospitalization/Surgery cataract surgery Family and Social History Cancer: Yes - Siblings; Diabetes: No; Heart Disease: No; Hereditary Spherocytosis: No; Hypertension: No; Kidney Disease: No; Lung Disease: No; Seizures: No; Stroke: No; Thyroid Problems: No; Tuberculosis: No; Never smoker; Marital Status - Married; Alcohol Use: Never; Drug Use: No History; Caffeine Use: Daily; Financial Concerns: No; Food, Clothing or Shelter Needs: No; Support System Lacking: No; Transportation Concerns: No Electronic Signature(s) Signed: 12/12/2021 10:03:26 AM By: [name] Maudlin MD FACS Signed: 12/16/2021 5:02:03 PM By: Adline Peals Entered By: [name] on 12/12/2021 09:13:23 -------------------------------------------------------------------------------- SuperBill Details Patient Name: Date of Service: [name], [name] 12/12/2021 Medical Record Number: [id number] Patient Account Number: [id number] Date of  Birth/Sex: Treating RN: [birth year] (88 y.o. [name] Primary Care Provider: Dettinger, Vonna [name] Other Clinician: Referring Provider: Treating Provider/Extender: [name] [name] in Treatment: 16 Diagnosis Coding ICD-10 Codes Code Description [phone] Non-pressure chronic ulcer of other part of left foot with bone involvement without evidence of necrosis S90.812D Abrasion, left foot, subsequent encounter Facility Procedures CPT4 Code: [id number] Description: 7597 - DEBRIDE WOUND 1ST 20 SQ CM OR < ICD-10 Diagnosis Description L97.526 Non-pressure chronic ulcer of other part of left foot with bone involvement witho Modifier: ut evidence of necr Quantity: 1 osis Physician Procedures : CPT4 Code Description Modifier [id number] - WC PHYS LEVEL 3 - EST PT 25 ICD-10 Diagnosis Description L97.526 Non-pressure chronic ulcer of other part of left foot with bone involvement without evidence of necro S90.812D Abrasion, left foot,  subsequent encounter Quantity: 1 sis : [id number] - WC PHYS DEBR WO ANESTH 20 SQ CM ICD-10 Diagnosis Description L97.526 Non-pressure chronic ulcer of other part of left foot with bone involvement without evidence of necro Quantity: 1 sis Electronic Signature(s) Signed: 12/12/2021 9:16:31 AM By: [name] Maudlin MD FACS Entered By: [name] on 12/12/2021 09:16:31

## 2021-12-16 NOTE — Progress Notes (Signed)
NADELYN, ENRIQUES ([id number]) Visit Report for 12/12/2021 Arrival Information Details Patient Name: Date of Service: [name], Billiejean 12/12/2021 8:30 A M Medical Record Number: [id number] Patient Account Number: [id number] Date of Birth/Sex: Treating RN: May 07, 1932 (88 y.o. Marta Lamas Primary Care Lorann Tani: Dettinger, Vonna Kotyk Other Clinician: Referring Monico Sudduth: Treating Firman Petrow/Extender: Fredirick Maudlin Dettinger, [name] in Treatment: 49 Visit Information History Since Last Visit All ordered tests and consults were completed: Yes Patient Arrived: [name] Added or deleted any medications: No Arrival Time: 08:33 Any new allergies or adverse reactions: No Accompanied By: son Had a fall or experienced change in No Transfer Assistance: EasyPivot Patient Lift activities of daily living that may affect Patient Identification Verified: Yes risk of falls: Secondary Verification Process Completed: Yes Signs or symptoms of abuse/neglect since last visito No Patient Requires Transmission-Based Precautions: No Hospitalized since last visit: No Implantable device outside of the clinic excluding No cellular tissue based products placed in the center since last visit: Has Dressing in Place as Prescribed: Yes Has Footwear/Offloading in Place as Prescribed: Yes Left: Wedge Shoe Pain Present Now: No Electronic Signature(s) Signed: 12/13/2021 7:35:53 AM By: Blanche East RN Entered By: Blanche East on 12/12/2021 08:45:21 -------------------------------------------------------------------------------- Encounter Discharge Information Details Patient Name: Date of Service: [name], Marykathleen 12/12/2021 8:30 A M Medical Record Number: [id number] Patient Account Number: [id number] Date of Birth/Sex: Treating RN: [birth year] (88 y.o. Marta Lamas Primary Care Alma Muegge: Dettinger, Vonna Kotyk Other Clinician: Referring Jeanett Antonopoulos: Treating Dade Rodin/Extender: Fredirick Maudlin Dettinger,  [name] in Treatment: 16 Encounter Discharge Information Items Post Procedure Vitals Discharge Condition: Stable Temperature (F): 98.3 Ambulatory Status: Walker Pulse (bpm): 73 Discharge Destination: Home Respiratory Rate (breaths/min): 18 Transportation: Private Auto Blood Pressure (mmHg): 142/62 Accompanied By: son Schedule Follow-up Appointment: Yes Clinical Summary of Care: Electronic Signature(s) Signed: 12/13/2021 7:35:53 AM By: Blanche East RN Entered By: Blanche East on 12/12/2021 12:23:28 -------------------------------------------------------------------------------- Lower Extremity Assessment Details Patient Name: Date of Service: [name], Lorenzo 12/12/2021 8:30 A M Medical Record Number: [id number] Patient Account Number: [id number] Date of Birth/Sex: Treating RN: 05-22-1932 (88 y.o. Marta Lamas Primary Care Khadir Roam: Dettinger, Vonna Kotyk Other Clinician: Referring Jolleen Seman: Treating Lasheena Frieze/Extender: Fredirick Maudlin Dettinger, [name] in Treatment: 16 Edema Assessment Assessed: Shirlyn Goltz: No] Patrice Paradise: No] Edema: [Left: N] [Right: o] Calf Left: Right: Point of Measurement: 33 cm From Medial Instep 29.5 cm Ankle Left: Right: Point of Measurement: 9 cm From Medial Instep 21 cm Vascular Assessment Pulses: Dorsalis Pedis Palpable: [Left:Yes] Electronic Signature(s) Signed: 12/13/2021 7:35:53 AM By: Blanche East RN Entered By: Blanche East on 12/12/2021 08:48:15 -------------------------------------------------------------------------------- Multi Wound Chart Details Patient Name: Date of Service: [name], Zaraya 12/12/2021 8:30 A M Medical Record Number: [id number] Patient Account Number: [id number] Date of Birth/Sex: Treating RN: 04-28-1932 (88 y.o. [name] Primary Care Jadi Deyarmin: Dettinger, Vonna Kotyk Other Clinician: Referring Sander Speckman: Treating Xzander Gilham/Extender: Fredirick Maudlin Dettinger, [name] in Treatment:  16 Vital Signs Height(in): 60 Pulse(bpm): 77 Weight(lbs): 119 Blood Pressure(mmHg): 142/62 Body Mass Index(BMI): 23.2 Temperature(F): 98.3 Respiratory Rate(breaths/min): 18 Photos: [1:Left, Medial Foot] [N/A:N/A N/A] Wound Location: [1:Gradually Appeared] [N/A:N/A] Wounding Event: [1:Pressure Ulcer] [N/A:N/A] Primary Etiology: [1:Cataracts, Hypertension, Rheumatoid N/A] Comorbid History: [1:Arthritis 05/22/2021] [N/A:N/A] Date Acquired: [1:16] [N/A:N/A] Weeks of Treatment: [1:Open] [N/A:N/A] Wound Status: [1:No] [N/A:N/A] Wound Recurrence: [1:0.3x0.3x0.1] [N/A:N/A] Measurements L x W x D (cm) [1:0.071] [N/A:N/A] A (cm) : rea [1:0.007] [N/A:N/A] Volume (cm) : [1:97.90%] [N/A:N/A] % Reduction in A [1:rea: 98.00%] [N/A:N/A] % Reduction in Volume: [1:Category/Stage III] [N/A:N/A]  Classification: [1:Medium] [N/A:N/A] Exudate A mount: [1:Serous] [N/A:N/A] Exudate Type: [1:amber] [N/A:N/A] Exudate Color: [1:Well defined, not attached] [N/A:N/A] Wound Margin: [1:Large (67-100%)] [N/A:N/A] Granulation A mount: [1:Red] [N/A:N/A] Granulation Quality: [1:Small (1-33%)] [N/A:N/A] Necrotic A mount: [1:Eschar] [N/A:N/A] Necrotic Tissue: [1:Fat Layer (Subcutaneous Tissue): Yes N/A] Exposed Structures: [1:Fascia: No Tendon: No Muscle: No Joint: No Bone: No Large (67-100%)] [N/A:N/A] Epithelialization: [1:Debridement - Selective/Open Wound N/A] Debridement: Pre-procedure Verification/Time Out 09:01 [N/A:N/A] Taken: [1:Lidocaine 5% topical ointment] [N/A:N/A] Pain Control: [1:Necrotic/Eschar] [N/A:N/A] Tissue Debrided: [1:Non-Viable Tissue] [N/A:N/A] Level: [1:0.09] [N/A:N/A] Debridement A (sq cm): [1:rea Curette] [N/A:N/A] Instrument: [1:Minimum] [N/A:N/A] Bleeding: [1:Pressure] [N/A:N/A] Hemostasis A chieved: [1:0] [N/A:N/A] Procedural Pain: [1:0] [N/A:N/A] Post Procedural Pain: [1:Procedure was tolerated well] [N/A:N/A] Debridement Treatment Response: [1:0.3x0.3x0.1]  [N/A:N/A] Post Debridement Measurements L x W x D (cm) [1:0.007] [N/A:N/A] Post Debridement Volume: (cm) [1:Category/Stage III] [N/A:N/A] Post Debridement Stage: [1:Debridement] [N/A:N/A] Treatment Notes Electronic Signature(s) Signed: 12/12/2021 9:10:41 AM By: Fredirick Maudlin MD FACS Signed: 12/16/2021 5:02:03 PM By: Adline Peals Entered By: Fredirick Maudlin on 12/12/2021 09:10:41 -------------------------------------------------------------------------------- Multi-Disciplinary Care Plan Details Patient Name: Date of Service: [name], Michael 12/12/2021 8:30 A M Medical Record Number: [id number] Patient Account Number: [id number] Date of Birth/Sex: Treating RN: December 16, 1932 (88 y.o. Marta Lamas Primary Care Kiara Mcdowell: Dettinger, Vonna Kotyk Other Clinician: Referring Somya Jauregui: Treating Navdeep Fessenden/Extender: Fredirick Maudlin Dettinger, [name] in Treatment: 16 Active Inactive Abuse / Safety / Falls / Self Care Management Nursing Diagnoses: Impaired physical mobility Potential for falls Potential for injury related to falls Goals: Patient will remain injury free related to falls Date Initiated: 08/20/2021 Target Resolution Date: 12/20/2021 Goal Status: Active Patient/caregiver will demonstrate safe use of adaptive devices to increase mobility Date Initiated: 08/20/2021 Date Inactivated: 09/11/2021 Target Resolution Date: 10/11/2021 Goal Status: Met Patient/caregiver will verbalize understanding of skin care regimen Date Initiated: 08/20/2021 Date Inactivated: 09/11/2021 Target Resolution Date: 10/11/2021 Goal Status: Met Interventions: Assess fall risk on admission and as needed Assess impairment of mobility on admission and as needed per policy Provide education on fall prevention Notes: Wound/Skin Impairment Nursing Diagnoses: Impaired tissue integrity Knowledge deficit related to ulceration/compromised skin integrity Goals: Patient/caregiver will verbalize  understanding of skin care regimen Date Initiated: 08/20/2021 Date Inactivated: 09/11/2021 Target Resolution Date: 09/10/2021 Goal Status: Met Ulcer/skin breakdown will have a volume reduction of 30% by week 4 Date Initiated: 08/20/2021 Target Resolution Date: 12/20/2021 Goal Status: Active Interventions: Assess patient/caregiver ability to perform ulcer/skin care regimen upon admission and as needed Assess ulceration(s) every visit Treatment Activities: Referred to DME Abundio Teuscher for dressing supplies : 08/20/2021 Skin care regimen initiated : 08/20/2021 Topical wound management initiated : 08/20/2021 Notes: Electronic Signature(s) Signed: 12/13/2021 7:35:53 AM By: Blanche East RN Entered By: Blanche East on 12/12/2021 09:01:13 -------------------------------------------------------------------------------- Pain Assessment Details Patient Name: Date of Service: [name], Milina 12/12/2021 8:30 A M Medical Record Number: [id number] Patient Account Number: [id number] Date of Birth/Sex: Treating RN: October 16, 1932 (88 y.o. Marta Lamas Primary Care Kendrew Paci: Dettinger, Vonna Kotyk Other Clinician: Referring Shikara Mcauliffe: Treating Kyan Yurkovich/Extender: Fredirick Maudlin Dettinger, [name] in Treatment: 16 Active Problems Location of Pain Severity and Description of Pain Patient Has Paino No Site Locations Pain Management and Medication Current Pain Management: Electronic Signature(s) Signed: 12/13/2021 7:35:53 AM By: Blanche East RN Entered By: Blanche East on 12/12/2021 08:46:21 -------------------------------------------------------------------------------- Patient/Caregiver Education Details Patient Name: Date of Service: [name], Bradi 8/24/2023andnbsp8:30 A M Medical Record Number: [id number] Patient Account Number: [id number] Date of Birth/Gender: Treating RN: [birth year] (88 y.o. F) [place], [place]  Primary Care Physician: Dettinger, Vonna Kotyk Other Clinician: Referring  Physician: Treating Physician/Extender: Fredirick Maudlin Dettinger, [name] in Treatment: 16 Education Assessment Education Provided To: Patient Education Topics Provided Safety: Methods: Explain/Verbal Responses: Reinforcements needed, State content correctly [employer]) Signed: 12/13/2021 7:35:53 AM By: Blanche East RN Entered By: Blanche East on 12/12/2021 12:22:45 -------------------------------------------------------------------------------- Wound Assessment Details Patient Name: Date of Service: [name], Kalei 12/12/2021 8:30 A M Medical Record Number: [id number] Patient Account Number: [id number] Date of Birth/Sex: Treating RN: [birth year] (88 y.o. Iver Nestle, [place] Primary Care Laquana Villari: Dettinger, Vonna Kotyk Other Clinician: Referring Tarena Gockley: Treating Tywana Robotham/Extender: Fredirick Maudlin Dettinger, [name] in Treatment: 16 Wound Status Wound Number: 1 Primary Etiology: Pressure Ulcer Wound Location: Left, Medial Foot Wound Status: Open Wounding Event: Gradually Appeared Comorbid History: Cataracts, Hypertension, Rheumatoid Arthritis Date Acquired: 05/22/2021 Weeks Of Treatment: 16 Clustered Wound: No Photos Wound Measurements Length: (cm) 0.3 Width: (cm) 0.3 Depth: (cm) 0.1 Area: (cm) 0.071 Volume: (cm) 0.007 % Reduction in Area: 97.9% % Reduction in Volume: 98% Epithelialization: Large (67-100%) Tunneling: No Undermining: No Wound Description Classification: Category/Stage III Wound Margin: Well defined, not attached Exudate Amount: Medium Exudate Type: Serous Exudate Color: amber Foul Odor After Cleansing: No Slough/Fibrino No Wound Bed Granulation Amount: Large (67-100%) Exposed Structure Granulation Quality: Red Fascia Exposed: No Necrotic Amount: Small (1-33%) Fat Layer (Subcutaneous Tissue) Exposed: Yes Necrotic Quality: Eschar Tendon Exposed: No Muscle Exposed: No Joint Exposed: No Bone Exposed: No Treatment  Notes Wound #1 (Foot) Wound Laterality: Left, Medial Cleanser Soap and Water Discharge Instruction: May shower and wash wound with dial antibacterial soap and water prior to dressing change. Wound Cleanser Discharge Instruction: Cleanse the wound with wound cleanser prior to applying a clean dressing using gauze sponges, not tissue or cotton balls. Peri-Wound Care Sween Lotion (Moisturizing lotion) Discharge Instruction: Apply moisturizing lotion as directed Topical Primary Dressing Promogran Prisma Matrix, 4.34 (sq in) (silver collagen) Discharge Instruction: Moisten collagen with saline or hydrogel Secondary Dressing Optifoam Non-Adhesive Dressing, 4x4 in Discharge Instruction: Apply over primary dressing as directed. Woven Gauze Sponges 2x2 in Discharge Instruction: Apply over primary dressing as directed. Secured With Elastic Bandage 4 inch (ACE bandage) Discharge Instruction: Secure with ACE bandage as directed. Kerlix Roll Sterile, 4.5x3.1 (in/yd) Discharge Instruction: Secure with Kerlix as directed. 29M Medipore Soft Cloth Surgical T 2x10 (in/yd) ape Discharge Instruction: Secure with tape as directed. Compression Wrap Compression Stockings Add-Ons Electronic Signature(s) Signed: 12/13/2021 7:35:53 AM By: Blanche East RN Entered By: Blanche East on 12/12/2021 08:53:32 -------------------------------------------------------------------------------- Vitals Details Patient Name: Date of Service: [name], Abagayle 12/12/2021 8:30 A M Medical Record Number: [id number] Patient Account Number: [id number] Date of Birth/Sex: Treating RN: 1933/02/10 (88 y.o. Iver Nestle, Jamie Primary Care Ayanna Gheen: Dettinger, Vonna Kotyk Other Clinician: Referring Adellyn Capek: Treating Shayden Gingrich/Extender: Fredirick Maudlin Dettinger, [name] in Treatment: 16 Vital Signs Time Taken: 08:45 Temperature (F): 98.3 Height (in): 60 Pulse (bpm): 73 Weight (lbs): 119 Respiratory Rate  (breaths/min): 18 Body Mass Index (BMI): 23.2 Blood Pressure (mmHg): 142/62 Reference Range: 80 - 120 mg / dl Electronic Signature(s) Signed: 12/13/2021 7:35:53 AM By: Blanche East RN Entered By: Blanche East on 12/12/2021 08:46:13

## 2021-12-18 ENCOUNTER — Encounter (HOSPITAL_BASED_OUTPATIENT_CLINIC_OR_DEPARTMENT_OTHER): Payer: Medicare Other | Admitting: General Surgery

## 2021-12-18 DIAGNOSIS — I10 Essential (primary) hypertension: Secondary | ICD-10-CM | POA: Diagnosis not present

## 2021-12-18 DIAGNOSIS — L97526 Non-pressure chronic ulcer of other part of left foot with bone involvement without evidence of necrosis: Secondary | ICD-10-CM | POA: Diagnosis not present

## 2021-12-18 DIAGNOSIS — Z09 Encounter for follow-up examination after completed treatment for conditions other than malignant neoplasm: Secondary | ICD-10-CM | POA: Diagnosis not present

## 2021-12-18 DIAGNOSIS — M069 Rheumatoid arthritis, unspecified: Secondary | ICD-10-CM | POA: Diagnosis not present

## 2021-12-18 DIAGNOSIS — S90812A Abrasion, left foot, initial encounter: Secondary | ICD-10-CM | POA: Diagnosis not present

## 2021-12-18 NOTE — Progress Notes (Signed)
ROY, SNUFFER ([id number]) Visit Report for 12/18/2021 Chief Complaint Document Details Patient Name: Date of Service: [name], [name] 12/18/2021 8:00 [name] M Medical Record Number: [id number] Patient Account Number: [id number] Date of Birth/Sex: Treating RN: June 09, 1932 (88 y.o. F) Primary Care Provider: Dettinger, Vonna Kotyk Other Clinician: Referring Provider: Treating Provider/Extender: Fredirick Maudlin Dettinger, [name] in Treatment: 17 Information Obtained from: Patient Chief Complaint Patient is at the clinic for treatment of an open ulcer on her left medial first metatarsal head, secondary to friction and pressure. Electronic Signature(s) Signed: 12/18/2021 8:31:14 AM By: Fredirick Maudlin MD FACS Entered By: Fredirick Maudlin on 12/18/2021 08:31:14 -------------------------------------------------------------------------------- HPI Details Patient Name: Date of Service: [name], [name] 12/18/2021 8:00 [name] M Medical Record Number: [id number] Patient Account Number: [id number] Date of Birth/Sex: Treating RN: [birth year] (88 y.o. F) Primary Care Provider: Dettinger, Vonna Kotyk Other Clinician: Referring Provider: Treating Provider/Extender: Fredirick Maudlin Dettinger, [name] in Treatment: 17 History of Present Illness HPI Description: ADMISSION 08/20/2021 This is an 88 year old Spanish-speaking patient who is being evaluated for an ulcer on the medial aspect of the left first metatarsal head. She says that in February, her shoes were rubbing against [name] bunion in this location and she first developed [name] blister that ultimately broke down to an ulcer. She is not [name] diabetic and she does not smoke. She does have some high blood pressure as well as rheumatoid arthritis. ABI in clinic was noncompressible. When the wound first appeared, she was seen in urgent care and was started on Bactrim. Her PCP followed up and recommended daily Xeroform gauze dressing changes. Initially, it sounds like  the wound was quite painful but it is now quite improved. Apparently she had been changing the dressing twice [name] day at [name] minimum, and per her PCP's note she may be scrubbing it quite aggressively up to 4 times [name] day. Today, the wound is clean, but there is perimeter of undermining. I am not entirely sure that I am probing bone but we are certainly very close to it. The surface is [name] little bit pale but otherwise healthy. No odor or concern for infection. 08/27/2021: The wound is about the same today. There is [name] little bit more undermining underneath the callus that is formed around the perimeter. The wound surface is pink and moist. No odor or significant drainage. 09/04/2021: The wound is the same size today. It is very clean and the degree of undermining is about the same. The surface is [name] little bit hypertrophic. We have been using silver collagen and the patient's son reports that they have been making [name] concerted effort to tuck the dressing into the undermined area of tissue. No concern for infection. 09/11/2021: The wound is really unchanged with perhaps [name] little bit less undermining. It is clean and they have been using silver collagen, taking care to tuck it into the undermined area. I thought we had applied for snap VAC approval last week, but for some reason we are unable to find this. She does say that the degree of pain she is experiencing is less and that it sometimes just throbs at night but is better than previously. 09/18/2021: Last week, due to lack of progression with the wound, I took [name] culture that grew out skin flora but I also got an x-ray that was suggestive of osteomyelitis. She continues to complain of pain and throbbing at night but nothing throughout the day. She has been approved for snap VAC use with [name] $70- $100  co-pay with each application. The wound itself really looks no different today. 09/25/2021: I initiated doxycycline last week and she has been taking this. We also applied  the snap VAC. The wound looks markedly better today with significant closure of the undermined portion. She continues to have some throbbing pain at night, but otherwise is doing well. We ordered an MRI to get [name] more definitive answer regarding the possibility of osteomyelitis, but they have not yet received an appointment for this. 10/02/2021: She had her MRI just prior to coming to clinic so I do not yet have any results. She continues to have throbbing pain at night. The wound itself is [name] bit smaller. There is some periwound moisture with [name] little bit of slough but the undermined area is almost closed and I am unable to probe to bone. 10/10/2021: Unfortunately, the MRI done last week showed evidence of osteomyelitis. I renewed her doxycycline prescription for an additional 4 weeks. She has an appointment with infectious disease next week. Her primary care provider prescribed diclofenac which seems to be helping with her throbbing nighttime pain. The wound is about the same size today but there is no undermining present whatsoever. The surface is healthy with just [name] small amount of slough on the most proximal aspect of the wound. 10/16/2021: T oday, she is complaining that for the last 3 days, her foot has been more swollen and tender. She does have some visible swelling present and her foot feels warmer than normal. There is some erythema surrounding her wound and over the dorsum of her foot. She says that when she takes [name] step, the ball of her foot hurts more than it has. She has been taking doxycycline and seems to be tolerating this well. Her son also purchased some Pro-Stat and she has been taking this, although she does not particularly care for the taste. She has an appointment with infectious disease this morning after our visit. The wound itself is [name] little bit smaller. The periwound skin is slightly macerated but there is no undermining and there is granulation tissue on the surface. No odor  or purulent drainage. 10/23/2021: She saw infectious disease last week and they initiated linezolid and levofloxacin. Unfortunately, this has resulted in [name] lot of nausea and vomiting on the patient's part. She is not taking either medication with any food and this seems to be part of the issue. Her overall oral intake remains quite poor, despite trying to get Pro-Stat and other agents into her system. She did start taking [name] probiotic but subsequently stopped taking the antibiotic so its not clear if this has had any beneficial effect or not. She is also scheduled to have formal ABIs performed in [name] couple of weeks, again per infectious disease. Her wound is [name] little bit smaller today and has [name] clean surface with just [name] little bit of biofilm and slough accumulation. The undermining and tunneling remain closed. 10/30/2021: Her son contacted infectious disease and they adjusted her antibiotic regimen. She is tolerating the new antibiotics without nausea or vomiting. She feels like the swelling and pain in her foot has diminished since initiating antibiotic therapy. The wound is nearly flush with the surrounding skin and has granulation tissue with just [name] little bit of slough. 11/06/2021: The snap VAC fell off last Friday. In the interim, they have been applying Prisma silver collagen to the site. The wound surface looks fairly robust and pink. There is [name] thin layer of slough on the wound  surface. No undermining present. Swelling and warmth has improved. The patient continues to struggle with poor appetite; as result of not eating well, she is also having difficulty tolerating her oral antibiotic regimen. She is scheduled to have ABIs performed later this morning. 11/19/2021: ABIs were done 2 weeks ago. Although she has normal ABIs, both TBI's were abnormal. 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 She saw infectious  disease [name] week ago; she will complete 2 more weeks of antibiotics from the time of that visit. She has responded well to the Megace that I prescribed and her appetite has improved. We discontinued the wound VAC and have been applying endoform. T oday, the wound is markedly improved. There is good perimeter epithelialization and contracture of the wound. She has robust granulation tissue forming and [name] minimum of slough. 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 11/28/2021: Last week we applied Grafix plus. T oday, although there is [name] hard layer of eschar overlying the wound, underneath this, it is smaller and has lovely robust granulation tissue. Bone is completely covered. She continues to complain of pulsatile pain at night but this seems to be managed fairly well with the meloxicam and Tylenol that she has been taking. Her appetite continues to be good on Megace. She will complete her oral antibiotics after today's doses. She has an appointment with vascular surgery to address her abnormal ABIs coming up on 23 August. She has received approval for [facility]. 12/05/2021: Today, the wound has decreased in size considerably. There is [name] layer of eschar on the surface, but underneath the granulation tissue is very healthy. Last week, I was unaware that she has [name] 20% co-pay with [facility], as she has not yet met her out-of-pocket maximum. I discussed this with the patient and her son and they expressed [name] desire to proceed with [facility] #2 application today anyway. 12/12/2021: Her wound is nearly closed. There is [name] layer of eschar over the surface, underneath which there are just [name] couple of tiny openings in the skin. She met with vascular surgery and infectious disease yesterday. ID said that she no longer needed antibiotics and vascular surgery felt that as long as she continued to heal, no intervention was necessary, but if she had [name] recurrence of her wound then an aortogram would be warranted. 12/18/2021:  Her wound has healed. Electronic Signature(s) Signed: 12/18/2021 8:31:29 AM By: Fredirick Maudlin MD FACS Entered By: Fredirick Maudlin on 12/18/2021 08:31:29 -------------------------------------------------------------------------------- Physical Exam Details Patient Name: Date of Service: [name], [name] 12/18/2021 8:00 [name] M Medical Record Number: [id number] Patient Account Number: [id number] Date of Birth/Sex: Treating RN: Jun 03, 1932 (88 y.o. F) Primary Care Provider: Dettinger, Vonna Kotyk Other Clinician: Referring Provider: Treating Provider/Extender: Fredirick Maudlin Dettinger, [name] in Treatment: 17 Constitutional Hypertensive, asymptomatic. . . . No acute distress.Marland Kitchen Respiratory Normal work of breathing on room air.. Notes 12/18/2021: Her wound is healed. Electronic Signature(s) Signed: 12/18/2021 8:31:59 AM By: Fredirick Maudlin MD FACS Entered By: Fredirick Maudlin on 12/18/2021 08:31:59 -------------------------------------------------------------------------------- Physician Orders Details Patient Name: Date of Service: [name], [name] 12/18/2021 8:00 [name] M Medical Record Number: [id number] Patient Account Number: [id number] Date of Birth/Sex: Treating RN: [birth year] (88 y.o. [name] Primary Care Provider: Dettinger, Vonna Kotyk Other Clinician: Referring Provider: Treating Provider/Extender: Fredirick Maudlin Dettinger, [name] in Treatment: [phone] Verbal / Phone Orders: No Diagnosis Coding ICD-10 Coding Code Description [phone]  Non-pressure chronic ulcer of other part of left foot with bone involvement without evidence of necrosis S90.812D Abrasion, left foot, subsequent encounter Discharge From [facility] Discharge from [facility]!!! Bathing/ Shower/ Hygiene Other Bathing/Shower/Hygiene Orders/Instructions: - can wash with soap and water, but do not scrub Additional Orders / Instructions Other: - can purchase [name] callous pad  to protect the area Patient Medications llergies: penicillin G, lisinopril [name] Notifications Medication Indication Start End 12/18/2021 lidocaine DOSE topical 5 % ointment - ointment topical Electronic Signature(s) Signed: 12/18/2021 8:32:09 AM By: Fredirick Maudlin MD FACS Entered By: Fredirick Maudlin on 12/18/2021 08:32:08 -------------------------------------------------------------------------------- Problem List Details Patient Name: Date of Service: [name], [name] 12/18/2021 8:00 [name] M Medical Record Number: [id number] Patient Account Number: [id number] Date of Birth/Sex: Treating RN: Sep 16, 1932 (88 y.o. F) Primary Care Provider: Dettinger, Vonna Kotyk Other Clinician: Referring Provider: Treating Provider/Extender: Fredirick Maudlin Dettinger, [name] in Treatment: 17 Active Problems ICD-10 Encounter Code Description Active Date MDM Diagnosis L97.526 Non-pressure chronic ulcer of other part of left foot with bone involvement 08/20/2021 No Yes without evidence of necrosis S90.812D Abrasion, left foot, subsequent encounter 11/06/2021 No Yes Inactive Problems Resolved Problems Electronic Signature(s) Signed: 12/18/2021 8:31:05 AM By: Fredirick Maudlin MD FACS Entered By: Fredirick Maudlin on 12/18/2021 08:31:05 -------------------------------------------------------------------------------- Progress Note Details Patient Name: Date of Service: [name], [name] 12/18/2021 8:00 [name] M Medical Record Number: [id number] Patient Account Number: [id number] Date of Birth/Sex: Treating RN: January 16, 1933 (88 y.o. F) Primary Care Provider: Dettinger, Vonna Kotyk Other Clinician: Referring Provider: Treating Provider/Extender: Fredirick Maudlin Dettinger, [name] in Treatment: 17 Subjective Chief Complaint Information obtained from Patient Patient is at the clinic for treatment of an open ulcer on her left medial first metatarsal head, secondary to friction and pressure. History of Present  Illness (HPI) ADMISSION 08/20/2021 This is an 88 year old Spanish-speaking patient who is being evaluated for an ulcer on the medial aspect of the left first metatarsal head. She says that in February, her shoes were rubbing against [name] bunion in this location and she first developed [name] blister that ultimately broke down to an ulcer. She is not [name] diabetic and she does not smoke. She does have some high blood pressure as well as rheumatoid arthritis. ABI in clinic was noncompressible. When the wound first appeared, she was seen in urgent care and was started on Bactrim. Her PCP followed up and recommended daily Xeroform gauze dressing changes. Initially, it sounds like the wound was quite painful but it is now quite improved. Apparently she had been changing the dressing twice [name] day at [name] minimum, and per her PCP's note she may be scrubbing it quite aggressively up to 4 times [name] day. Today, the wound is clean, but there is perimeter of undermining. I am not entirely sure that I am probing bone but we are certainly very close to it. The surface is [name] little bit pale but otherwise healthy. No odor or concern for infection. 08/27/2021: The wound is about the same today. There is [name] little bit more undermining underneath the callus that is formed around the perimeter. The wound surface is pink and moist. No odor or significant drainage. 09/04/2021: The wound is the same size today. It is very clean and the degree of undermining is about the same. The surface is [name] little bit hypertrophic. We have been using silver collagen and the patient's son reports that they have been making [name] concerted effort to tuck the dressing into the undermined area  of tissue. No concern for infection. 09/11/2021: The wound is really unchanged with perhaps [name] little bit less undermining. It is clean and they have been using silver collagen, taking care to tuck it into the undermined area. I thought we had applied for snap VAC approval last  week, but for some reason we are unable to find this. She does say that the degree of pain she is experiencing is less and that it sometimes just throbs at night but is better than previously. 09/18/2021: Last week, due to lack of progression with the wound, I took [name] culture that grew out skin flora but I also got an x-ray that was suggestive of osteomyelitis. She continues to complain of pain and throbbing at night but nothing throughout the day. She has been approved for snap VAC use with [name] $70- $100 co-pay with each application. The wound itself really looks no different today. 09/25/2021: I initiated doxycycline last week and she has been taking this. We also applied the snap VAC. The wound looks markedly better today with significant closure of the undermined portion. She continues to have some throbbing pain at night, but otherwise is doing well. We ordered an MRI to get [name] more definitive answer regarding the possibility of osteomyelitis, but they have not yet received an appointment for this. 10/02/2021: She had her MRI just prior to coming to clinic so I do not yet have any results. She continues to have throbbing pain at night. The wound itself is [name] bit smaller. There is some periwound moisture with [name] little bit of slough but the undermined area is almost closed and I am unable to probe to bone. 10/10/2021: Unfortunately, the MRI done last week showed evidence of osteomyelitis. I renewed her doxycycline prescription for an additional 4 weeks. She has an appointment with infectious disease next week. Her primary care provider prescribed diclofenac which seems to be helping with her throbbing nighttime pain. The wound is about the same size today but there is no undermining present whatsoever. The surface is healthy with just [name] small amount of slough on the most proximal aspect of the wound. 10/16/2021: Today, she is complaining that for the last 3 days, her foot has been more swollen and tender. She  does have some visible swelling present and her foot feels warmer than normal. There is some erythema surrounding her wound and over the dorsum of her foot. She says that when she takes [name] step, the ball of her foot hurts more than it has. She has been taking doxycycline and seems to be tolerating this well. Her son also purchased some Pro-Stat and she has been taking this, although she does not particularly care for the taste. She has an appointment with infectious disease this morning after our visit. The wound itself is [name] little bit smaller. The periwound skin is slightly macerated but there is no undermining and there is granulation tissue on the surface. No odor or purulent drainage. 10/23/2021: She saw infectious disease last week and they initiated linezolid and levofloxacin. Unfortunately, this has resulted in [name] lot of nausea and vomiting on the patient's part. She is not taking either medication with any food and this seems to be part of the issue. Her overall oral intake remains quite poor, despite trying to get Pro-Stat and other agents into her system. She did start taking [name] probiotic but subsequently stopped taking the antibiotic so its not clear if this has had any beneficial effect or not. She is  also scheduled to have formal ABIs performed in [name] couple of weeks, again per infectious disease. Her wound is [name] little bit smaller today and has [name] clean surface with just [name] little bit of biofilm and slough accumulation. The undermining and tunneling remain closed. 10/30/2021: Her son contacted infectious disease and they adjusted her antibiotic regimen. She is tolerating the new antibiotics without nausea or vomiting. She feels like the swelling and pain in her foot has diminished since initiating antibiotic therapy. The wound is nearly flush with the surrounding skin and has granulation tissue with just [name] little bit of slough. 11/06/2021: The snap VAC fell off last Friday. In the interim, they have  been applying Prisma silver collagen to the site. The wound surface looks fairly robust and pink. There is [name] thin layer of slough on the wound surface. No undermining present. Swelling and warmth has improved. The patient continues to struggle with poor appetite; as result of not eating well, she is also having difficulty tolerating her oral antibiotic regimen. She is scheduled to have ABIs performed later this morning. 11/19/2021: ABIs were done 2 weeks ago. Although she has normal ABIs, both TBI's were abnormal. 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 She saw infectious disease [name] week ago; she will complete 2 more weeks of antibiotics from the time of that visit. She has responded well to the Megace that I prescribed and her appetite has improved. We discontinued the wound VAC and have been applying endoform. T oday, the wound is markedly improved. There is good perimeter epithelialization and contracture of the wound. She has robust granulation tissue forming and [name] minimum of slough. 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 11/28/2021: Last week we applied Grafix plus. T oday, although there is [name] hard layer of eschar overlying the wound, underneath this, it is smaller and has lovely robust granulation tissue. Bone is completely covered. She continues to complain of pulsatile pain at night but this seems to be managed fairly well with the meloxicam and Tylenol that she has been taking. Her appetite continues to be good on Megace. She will complete her oral antibiotics after today's doses. She has an appointment with vascular surgery to address her abnormal ABIs coming up on 23 August. She has received approval for [facility]. 12/05/2021: Today, the wound has decreased in size considerably. There is [name] layer of eschar on the surface, but underneath the granulation tissue is very healthy. Last week, I was  unaware that she has [name] 20% co-pay with [facility], as she has not yet met her out-of-pocket maximum. I discussed this with the patient and her son and they expressed [name] desire to proceed with [facility] #2 application today anyway. 12/12/2021: Her wound is nearly closed. There is [name] layer of eschar over the surface, underneath which there are just [name] couple of tiny openings in the skin. She met with vascular surgery and infectious disease yesterday. ID said that she no longer needed antibiotics and vascular surgery felt that as long as she continued to heal, no intervention was necessary, but if she had [name] recurrence of her wound then an aortogram would be warranted. 12/18/2021: Her wound has healed. Patient History Information obtained from Patient. Family History Cancer - Siblings, No family history of Diabetes, Heart Disease, Hereditary Spherocytosis, Hypertension, Kidney Disease, Lung Disease, Seizures, Stroke, Thyroid Problems, Tuberculosis. Social History Never smoker, Marital Status - Married, Alcohol  Use - Never, Drug Use - No History, Caffeine Use - Daily. Medical History Eyes Patient has history of Cataracts Cardiovascular Patient has history of Hypertension Musculoskeletal Patient has history of Rheumatoid Arthritis - hands and feet Hospitalization/Surgery History - cataract surgery. Medical [name] Surgical History Notes nd Eyes macular degeneration Ear/Nose/Mouth/Throat GERD, dysarthria Hematologic/Lymphatic dyslipidemia Cardiovascular palpitations Musculoskeletal osteoporosis, unsteady gait Neurologic TIA Objective Constitutional Hypertensive, asymptomatic. No acute distress.. Vitals Time Taken: 8:10 AM, Height: 60 in, Weight: 119 lbs, BMI: 23.2, Temperature: 98.3 F, Pulse: 69 bpm, Respiratory Rate: 18 breaths/min, Blood Pressure: 150/69 mmHg. Respiratory Normal work of breathing on room air.. General Notes: 12/18/2021: Her wound is healed. Integumentary (Hair, Skin) Wound #1  status is Healed - Epithelialized. Original cause of wound was Gradually Appeared. The date acquired was: 05/22/2021. The wound has been in treatment 17 weeks. The wound is located on the Left,Medial Foot. The wound measures 0cm length x 0cm width x 0cm depth; 0cm^2 area and 0cm^3 volume. There is no tunneling or undermining noted. There is [name] none present amount of drainage noted. The wound margin is well defined and not attached to the wound base. There is no granulation within the wound bed. There is no necrotic tissue within the wound bed. Assessment Active Problems ICD-10 Non-pressure chronic ulcer of other part of left foot with bone involvement without evidence of necrosis Abrasion, left foot, subsequent encounter Plan Discharge From [facility] Services: Discharge from [place]!!! Bathing/ Shower/ Hygiene: Other Bathing/Shower/Hygiene Orders/Instructions: - can wash with soap and water, but do not scrub Additional Orders / Instructions: Other: - can purchase [name] callous pad to protect the area The following medication(s) was prescribed: lidocaine topical 5 % ointment ointment topical was prescribed at facility 12/18/2021: Her wound has healed. I have recommended that she continue to protect the area with [name] callus pad or similar to avoid re-traumatizing the area on her foot wear. We will discharge her for the wound care center at this time. She may follow-up as needed. Electronic Signature(s) Signed: 12/18/2021 8:32:49 AM By: Fredirick Maudlin MD FACS Entered By: Fredirick Maudlin on 12/18/2021 08:32:49 -------------------------------------------------------------------------------- HxROS Details Patient Name: Date of Service: [name], [name] 12/18/2021 8:00 [name] M Medical Record Number: [id number] Patient Account Number: [id number] Date of Birth/Sex: Treating RN: Mar 21, 1933 (88 y.o. F) Primary Care Provider: Dettinger, Vonna Kotyk Other Clinician: Referring  Provider: Treating Provider/Extender: Fredirick Maudlin Dettinger, [name] in Treatment: 17 Information Obtained From Patient Eyes Medical History: Positive for: Cataracts Past Medical History Notes: macular degeneration Ear/Nose/Mouth/Throat Medical History: Past Medical History Notes: GERD, dysarthria Hematologic/Lymphatic Medical History: Past Medical History Notes: dyslipidemia Cardiovascular Medical History: Positive for: Hypertension Past Medical History Notes: palpitations Musculoskeletal Medical History: Positive for: Rheumatoid Arthritis - hands and feet Past Medical History Notes: osteoporosis, unsteady gait Neurologic Medical History: Past Medical History Notes: TIA HBO Extended History Items Eyes: Cataracts Immunizations Pneumococcal Vaccine: Received Pneumococcal Vaccination: Yes Received Pneumococcal Vaccination On or After 60th Birthday: Yes Implantable Devices None Hospitalization / Surgery History Type of Hospitalization/Surgery cataract surgery Family and Social History Cancer: Yes - Siblings; Diabetes: No; Heart Disease: No; Hereditary Spherocytosis: No; Hypertension: No; Kidney Disease: No; Lung Disease: No; Seizures: No; Stroke: No; Thyroid Problems: No; Tuberculosis: No; Never smoker; Marital Status - Married; Alcohol Use: Never; Drug Use: No History; Caffeine Use: Daily; Financial Concerns: No; Food, Clothing or Shelter Needs: No; Support System Lacking: No; Transportation Concerns: No Electronic Signature(s) Signed: 12/18/2021 10:05:10 AM By: Fredirick Maudlin MD FACS Entered By:  Fredirick Maudlin on 12/18/2021 08:31:34 -------------------------------------------------------------------------------- SuperBill Details Patient Name: Date of Service: [name], [name] 12/18/2021 Medical Record Number: [id number] Patient Account Number: [id number] Date of Birth/Sex: Treating RN: [birth year] (88 y.o. F) Primary Care Provider: Dettinger,  Vonna Kotyk Other Clinician: Referring Provider: Treating Provider/Extender: Fredirick Maudlin Dettinger, [name] in Treatment: 17 Diagnosis Coding ICD-10 Codes Code Description [phone] Non-pressure chronic ulcer of other part of left foot with bone involvement without evidence of necrosis S90.812D Abrasion, left foot, subsequent encounter Facility Procedures CPT4 Code: [id number] Description: 38453 - WOUND CARE VISIT-LEV 3 EST PT Modifier: Quantity: 1 Physician Procedures : CPT4 Code Description Modifier [id number] 12248 - WC PHYS LEVEL 2 - EST PT ICD-10 Diagnosis Description L97.526 Non-pressure chronic ulcer of other part of left foot with bone involvement without evidence of necr S90.812D Abrasion, left foot, subsequent  encounter Quantity: 1 osis Electronic Signature(s) Signed: 12/18/2021 10:05:10 AM By: Fredirick Maudlin MD FACS Signed: 12/18/2021 4:43:51 PM By: Adline Peals Previous Signature: 12/18/2021 8:33:00 AM Version By: Fredirick Maudlin MD FACS Entered By: Adline Peals on 12/18/2021 08:36:19

## 2021-12-18 NOTE — Progress Notes (Signed)
LAVINE, [name] ([id number]) Visit Report for 12/18/2021 Arrival Information Details Patient Name: Date of Service: [name], [name] 12/18/2021 8:00 A M Medical Record Number: [id number] Patient Account Number: [id number] Date of Birth/Sex: Treating RN: 1933/01/31 (88 y.o. Donney Rankins, Lovena Le Primary Care Lucero Ide: Dettinger, Vonna Kotyk Other Clinician: Referring Liseth Wann: Treating Marylan Glore/Extender: Fredirick Maudlin Dettinger, [name] in Treatment: 54 Visit Information History Since Last Visit Added or deleted any medications: No Patient Arrived: [name] Any new allergies or adverse reactions: No Arrival Time: 08:08 Had a fall or experienced change in No Accompanied By: son activities of daily living that may affect Transfer Assistance: None risk of falls: Patient Identification Verified: Yes Signs or symptoms of abuse/neglect since last visito No Secondary Verification Process Completed: Yes Hospitalized since last visit: No Patient Requires Transmission-Based Precautions: No Implantable device outside of the clinic excluding No cellular tissue based products placed in the center since last visit: Has Dressing in Place as Prescribed: Yes Pain Present Now: No Electronic Signature(s) Signed: 12/18/2021 4:43:51 PM By: Adline Peals Entered By: Adline Peals on 12/18/2021 08:10:40 -------------------------------------------------------------------------------- Clinic Level of Care Assessment Details Patient Name: Date of Service: [name], [name] 12/18/2021 8:00 A M Medical Record Number: [id number] Patient Account Number: [id number] Date of Birth/Sex: Treating RN: 1932-10-15 (88 y.o. [name] Primary Care Merrie Epler: Dettinger, Vonna Kotyk Other Clinician: Referring Lilliemae Fruge: Treating Alok Minshall/Extender: Fredirick Maudlin Dettinger, [name] in Treatment: 17 Clinic Level of Care Assessment Items TOOL 4 Quantity Score X- 1 0 Use when only an EandM is  performed on FOLLOW-UP visit ASSESSMENTS - Nursing Assessment / Reassessment X- 1 10 Reassessment of Co-morbidities (includes updates in patient status) X- 1 5 Reassessment of Adherence to Treatment Plan ASSESSMENTS - Wound and Skin A ssessment / Reassessment X - Simple Wound Assessment / Reassessment - one wound 1 5 '[]'$  - 0 Complex Wound Assessment / Reassessment - multiple wounds '[]'$  - 0 Dermatologic / Skin Assessment (not related to wound area) ASSESSMENTS - Focused Assessment X- 1 5 Circumferential Edema Measurements - multi extremities '[]'$  - 0 Nutritional Assessment / Counseling / Intervention X- 1 5 Lower Extremity Assessment (monofilament, tuning fork, pulses) '[]'$  - 0 Peripheral Arterial Disease Assessment (using hand held doppler) ASSESSMENTS - Ostomy and/or Continence Assessment and Care '[]'$  - 0 Incontinence Assessment and Management '[]'$  - 0 Ostomy Care Assessment and Management (repouching, etc.) PROCESS - Coordination of Care X - Simple Patient / Family Education for ongoing care 1 15 '[]'$  - 0 Complex (extensive) Patient / Family Education for ongoing care X- 1 10 Staff obtains Programmer, systems, Records, T Results / Process Orders est '[]'$  - 0 Staff telephones HHA, Nursing Homes / Clarify orders / etc '[]'$  - 0 Routine Transfer to another Facility (non-emergent condition) '[]'$  - 0 Routine Hospital Admission (non-emergent condition) '[]'$  - 0 New Admissions / Biomedical engineer / Ordering NPWT Apligraf, etc. , '[]'$  - 0 Emergency Hospital Admission (emergent condition) X- 1 10 Simple Discharge Coordination '[]'$  - 0 Complex (extensive) Discharge Coordination PROCESS - Special Needs '[]'$  - 0 Pediatric / Minor Patient Management '[]'$  - 0 Isolation Patient Management '[]'$  - 0 Hearing / Language / Visual special needs '[]'$  - 0 Assessment of Community assistance (transportation, D/C planning, etc.) '[]'$  - 0 Additional assistance / Altered mentation '[]'$  - 0 Support Surface(s) Assessment  (bed, cushion, seat, etc.) INTERVENTIONS - Wound Cleansing / Measurement X - Simple Wound Cleansing - one wound 1 5 '[]'$  - 0 Complex Wound Cleansing - multiple wounds X- 1 5  Wound Imaging (photographs - any number of wounds) '[]'$  - 0 Wound Tracing (instead of photographs) '[]'$  - 0 Simple Wound Measurement - one wound '[]'$  - 0 Complex Wound Measurement - multiple wounds INTERVENTIONS - Wound Dressings X - Small Wound Dressing one or multiple wounds 1 10 '[]'$  - 0 Medium Wound Dressing one or multiple wounds '[]'$  - 0 Large Wound Dressing one or multiple wounds X- 1 5 Application of Medications - topical '[]'$  - 0 Application of Medications - injection INTERVENTIONS - Miscellaneous '[]'$  - 0 External ear exam '[]'$  - 0 Specimen Collection (cultures, biopsies, blood, body fluids, etc.) '[]'$  - 0 Specimen(s) / Culture(s) sent or taken to Lab for analysis '[]'$  - 0 Patient Transfer (multiple staff / Civil Service fast streamer / Similar devices) '[]'$  - 0 Simple Staple / Suture removal (25 or less) '[]'$  - 0 Complex Staple / Suture removal (26 or more) '[]'$  - 0 Hypo / Hyperglycemic Management (close monitor of Blood Glucose) '[]'$  - 0 Ankle / Brachial Index (ABI) - do not check if billed separately X- 1 5 Vital Signs Has the patient been seen at the hospital within the last three years: Yes Total Score: 95 Level Of Care: New/Established - Level 3 Electronic Signature(s) Signed: 12/18/2021 4:43:51 PM By: Adline Peals Entered By: Adline Peals on 12/18/2021 08:36:12 -------------------------------------------------------------------------------- Encounter Discharge Information Details Patient Name: Date of Service: [name], [name] 12/18/2021 8:00 A M Medical Record Number: [id number] Patient Account Number: [id number] Date of Birth/Sex: Treating RN: 1932/11/26 (88 y.o. [name] Primary Care Pam Vanalstine: Dettinger, Vonna Kotyk Other Clinician: Referring Milbern Doescher: Treating Jakim Drapeau/Extender: Fredirick Maudlin Dettinger, [name] in Treatment: 17 Encounter Discharge Information Items Discharge Condition: Stable Ambulatory Status: Walker Discharge Destination: Home Transportation: Private Auto Accompanied By: son Schedule Follow-up Appointment: Yes Clinical Summary of Care: Patient Declined Electronic Signature(s) Signed: 12/18/2021 4:43:51 PM By: Sabas Sous By: Adline Peals on 12/18/2021 08:24:48 -------------------------------------------------------------------------------- Lower Extremity Assessment Details Patient Name: Date of Service: [name], [name] 12/18/2021 8:00 A M Medical Record Number: [id number] Patient Account Number: [id number] Date of Birth/Sex: Treating RN: [birth year] (88 y.o. [name] Primary Care Demitrus Francisco: Dettinger, Vonna Kotyk Other Clinician: Referring Deshundra Waller: Treating Chiyo Fay/Extender: Fredirick Maudlin Dettinger, [name] in Treatment: 17 Edema Assessment Assessed: Shirlyn Goltz: No] Patrice Paradise: No] Edema: [Left: N] [Right: o] Calf Left: Right: Point of Measurement: 33 cm From Medial Instep 31 cm Ankle Left: Right: Point of Measurement: 9 cm From Medial Instep 21.3 cm Vascular Assessment Pulses: Dorsalis Pedis Palpable: [Left:Yes] Electronic Signature(s) Signed: 12/18/2021 4:43:51 PM By: Adline Peals Entered By: Adline Peals on 12/18/2021 08:13:53 -------------------------------------------------------------------------------- Multi Wound Chart Details Patient Name: Date of Service: [name], Snigdha 12/18/2021 8:00 A M Medical Record Number: [id number] Patient Account Number: [id number] Date of Birth/Sex: Treating RN: [birth year] (88 y.o. F) Primary Care Verlisa Vara: Dettinger, Vonna Kotyk Other Clinician: Referring Xylina Rhoads: Treating Deidra Spease/Extender: Fredirick Maudlin Dettinger, [name] in Treatment: 17 Vital Signs Height(in): 60 Pulse(bpm): 69 Weight(lbs): 119 Blood Pressure(mmHg):  150/69 Body Mass Index(BMI): 23.2 Temperature(F): 98.3 Respiratory Rate(breaths/min): 18 Photos: [N/A:N/A] Left, Medial Foot N/A N/A Wound Location: Gradually Appeared N/A N/A Wounding Event: Pressure Ulcer N/A N/A Primary Etiology: Cataracts, Hypertension, Rheumatoid N/A N/A Comorbid History: Arthritis 05/22/2021 N/A N/A Date Acquired: 17 N/A N/A Weeks of Treatment: Healed - Epithelialized N/A N/A Wound Status: No N/A N/A Wound Recurrence: 0x0x0 N/A N/A Measurements L x W x D (cm) 0 N/A N/A A (cm) : rea 0 N/A N/A Volume (cm) : 100.00% N/A N/A %  Reduction in A rea: 100.00% N/A N/A % Reduction in Volume: Category/Stage III N/A N/A Classification: None Present N/A N/A Exudate A mount: Well defined, not attached N/A N/A Wound Margin: None Present (0%) N/A N/A Granulation A mount: None Present (0%) N/A N/A Necrotic A mount: Fascia: No N/A N/A Exposed Structures: Fat Layer (Subcutaneous Tissue): No Tendon: No Muscle: No Joint: No Bone: No Large (67-100%) N/A N/A Epithelialization: Treatment Notes Wound #1 (Foot) Wound Laterality: Left, Medial Cleanser Peri-Wound Care Topical Primary Dressing Secondary Dressing Secured With Compression Wrap Compression Stockings Add-Ons Electronic Signature(s) Signed: 12/18/2021 8:31:09 AM By: Fredirick Maudlin MD FACS Entered By: Fredirick Maudlin on 12/18/2021 08:31:08 -------------------------------------------------------------------------------- Multi-Disciplinary Care Plan Details Patient Name: Date of Service: [name], [name] 12/18/2021 8:00 A M Medical Record Number: [id number] Patient Account Number: [id number] Date of Birth/Sex: Treating RN: 29-Dec-1932 (88 y.o. [name] Primary Care Donovan Gatchel: Dettinger, Vonna Kotyk Other Clinician: Referring Allexus Ovens: Treating Kynslee Baham/Extender: Fredirick Maudlin Dettinger, [name] in Treatment: 17 Active Inactive Electronic Signature(s) Signed:  12/18/2021 4:43:51 PM By: Sabas Sous By: Adline Peals on 12/18/2021 08:36:34 -------------------------------------------------------------------------------- Pain Assessment Details Patient Name: Date of Service: [name], [name] 12/18/2021 8:00 A M Medical Record Number: [id number] Patient Account Number: [id number] Date of Birth/Sex: Treating RN: Nov 08, 1932 (88 y.o. [name] Primary Care Branon Sabine: Dettinger, Vonna Kotyk Other Clinician: Referring Duaa Stelzner: Treating Othman Masur/Extender: Fredirick Maudlin Dettinger, [name] in Treatment: 17 Active Problems Location of Pain Severity and Description of Pain Patient Has Paino No Site Locations Rate the pain. Rate the pain. Current Pain Level: 0 Pain Management and Medication Current Pain Management: Electronic Signature(s) Signed: 12/18/2021 4:43:51 PM By: Adline Peals Entered By: Adline Peals on 12/18/2021 08:11:26 -------------------------------------------------------------------------------- Patient/Caregiver Education Details Patient Name: Date of Service: [name], [name] 8/30/2023andnbsp8:00 A M Medical Record Number: [id number] Patient Account Number: [id number] Date of Birth/Gender: Treating RN: [birth year] (88 y.o. [name] Primary Care Physician: Dettinger, Vonna Kotyk Other Clinician: Referring Physician: Treating Physician/Extender: Fredirick Maudlin Dettinger, [name] in Treatment: 19 Education Assessment Education Provided To: Patient Education Topics Provided Wound/Skin Impairment: Methods: Explain/Verbal Responses: Reinforcements needed, State content correctly [employer]) Signed: 12/18/2021 4:43:51 PM By: Adline Peals Entered By: Adline Peals on 12/18/2021 08:16:31 -------------------------------------------------------------------------------- Wound Assessment Details Patient Name: Date of Service: [name], [name]  12/18/2021 8:00 A M Medical Record Number: [id number] Patient Account Number: [id number] Date of Birth/Sex: Treating RN: [birth year] (88 y.o. [name] Primary Care Bianna Haran: Dettinger, Vonna Kotyk Other Clinician: Referring Adelaide Pfefferkorn: Treating Lethia Donlon/Extender: Fredirick Maudlin Dettinger, [name] in Treatment: 17 Wound Status Wound Number: 1 Primary Etiology: Pressure Ulcer Wound Location: Left, Medial Foot Wound Status: Healed - Epithelialized Wounding Event: Gradually Appeared Comorbid History: Cataracts, Hypertension, Rheumatoid Arthritis Date Acquired: 05/22/2021 Weeks Of Treatment: 17 Clustered Wound: No Photos Wound Measurements Length: (cm) Width: (cm) Depth: (cm) Area: (cm) Volume: (cm) 0 % Reduction in Area: 100% 0 % Reduction in Volume: 100% 0 Epithelialization: Large (67-100%) 0 Tunneling: No 0 Undermining: No Wound Description Classification: Category/Stage III Wound Margin: Well defined, not attached Exudate Amount: None Present Foul Odor After Cleansing: No Slough/Fibrino No Wound Bed Granulation Amount: None Present (0%) Exposed Structure Necrotic Amount: None Present (0%) Fascia Exposed: No Fat Layer (Subcutaneous Tissue) Exposed: No Tendon Exposed: No Muscle Exposed: No Joint Exposed: No Bone Exposed: No Treatment Notes Wound #1 (Foot) Wound Laterality: Left, Medial Cleanser Peri-Wound Care Topical Primary Dressing Secondary Dressing Secured With Compression Wrap Compression Stockings Add-Ons Electronic Signature(s) Signed: 12/18/2021  4:43:51 PM By: Sabas Sous By: Adline Peals on 12/18/2021 08:21:33 -------------------------------------------------------------------------------- Vitals Details Patient Name: Date of Service: [name], Neha 12/18/2021 8:00 A M Medical Record Number: [id number] Patient Account Number: [id number] Date of Birth/Sex: Treating RN: January 04, 1933 (88 y.o. [name] Primary Care Dyana Magner: Dettinger, Vonna Kotyk Other Clinician: Referring Alberto Pina: Treating Brandonn Capelli/Extender: Fredirick Maudlin Dettinger, [name] in Treatment: 17 Vital Signs Time Taken: 08:10 Temperature (F): 98.3 Height (in): 60 Pulse (bpm): 69 Weight (lbs): 119 Respiratory Rate (breaths/min): 18 Body Mass Index (BMI): 23.2 Blood Pressure (mmHg): 150/69 Reference Range: 80 - 120 mg / dl Electronic Signature(s) Signed: 12/18/2021 4:43:51 PM By: Adline Peals Entered By: Adline Peals on 12/18/2021 08:11:21

## 2021-12-19 ENCOUNTER — Ambulatory Visit: Payer: Medicare Other | Admitting: Podiatry

## 2021-12-31 DIAGNOSIS — M79673 Pain in unspecified foot: Secondary | ICD-10-CM | POA: Diagnosis not present

## 2021-12-31 DIAGNOSIS — M25572 Pain in left ankle and joints of left foot: Secondary | ICD-10-CM | POA: Diagnosis not present

## 2021-12-31 DIAGNOSIS — M25571 Pain in right ankle and joints of right foot: Secondary | ICD-10-CM | POA: Diagnosis not present

## 2022-01-09 ENCOUNTER — Telehealth: Payer: Self-pay | Admitting: Family Medicine

## 2022-01-09 MED ORDER — ATORVASTATIN CALCIUM 40 MG PO TABS: 40.0000 mg | ORAL_TABLET | Freq: Every day | ORAL | 3 refills | Status: DC

## 2022-01-09 NOTE — Telephone Encounter (Signed)
Sent refill for her

## 2022-01-09 NOTE — Telephone Encounter (Signed)
  Prescription Request  01/09/2022  Is this a "Controlled Substance" medicine? NO  Have you seen your PCP in the last 2 weeks? No pt has appt with Dr. Keturah Barre. On 02/12/2022  If YES, route message to pool  -  If NO, patient needs to be scheduled for appointment.  What is the name of the medication or equipment? atorvastatin (LIPITOR) 40 MG tablet  Have you contacted your pharmacy to request a refill? yes   Which pharmacy would you like this sent to? [facility]    Patient notified that their request is being sent to the clinical staff for review and that they should receive a response within 2 business days.

## 2022-02-12 ENCOUNTER — Ambulatory Visit (INDEPENDENT_AMBULATORY_CARE_PROVIDER_SITE_OTHER): Payer: Medicare Other | Admitting: Family Medicine

## 2022-02-12 ENCOUNTER — Encounter: Payer: Self-pay | Admitting: Family Medicine

## 2022-02-12 ENCOUNTER — Ambulatory Visit (INDEPENDENT_AMBULATORY_CARE_PROVIDER_SITE_OTHER): Payer: Medicare Other

## 2022-02-12 VITALS — BP 128/60 | HR 71 | Temp 97.1°F | Ht <= 58 in | Wt 120.0 lb

## 2022-02-12 DIAGNOSIS — I10 Essential (primary) hypertension: Secondary | ICD-10-CM | POA: Diagnosis not present

## 2022-02-12 DIAGNOSIS — J302 Other seasonal allergic rhinitis: Secondary | ICD-10-CM | POA: Diagnosis not present

## 2022-02-12 DIAGNOSIS — M810 Age-related osteoporosis without current pathological fracture: Secondary | ICD-10-CM

## 2022-02-12 DIAGNOSIS — M1712 Unilateral primary osteoarthritis, left knee: Secondary | ICD-10-CM

## 2022-02-12 DIAGNOSIS — Z23 Encounter for immunization: Secondary | ICD-10-CM

## 2022-02-12 DIAGNOSIS — E785 Hyperlipidemia, unspecified: Secondary | ICD-10-CM

## 2022-02-12 MED ORDER — DICLOFENAC SODIUM 75 MG PO TBEC: 75.0000 mg | DELAYED_RELEASE_TABLET | Freq: Two times a day (BID) | ORAL | 3 refills | Status: DC

## 2022-02-12 MED ORDER — ALENDRONATE SODIUM 70 MG PO TABS: 70.0000 mg | ORAL_TABLET | ORAL | 3 refills | Status: DC

## 2022-02-12 MED ORDER — LORATADINE 10 MG PO TABS: 10.0000 mg | ORAL_TABLET | Freq: Every day | ORAL | 3 refills | Status: DC

## 2022-02-12 MED ORDER — ATORVASTATIN CALCIUM 40 MG PO TABS: 40.0000 mg | ORAL_TABLET | Freq: Every day | ORAL | 3 refills | Status: DC

## 2022-02-12 MED ORDER — AMLODIPINE BESYLATE 5 MG PO TABS: 5.0000 mg | ORAL_TABLET | Freq: Every day | ORAL | 3 refills | Status: DC

## 2022-02-12 NOTE — Progress Notes (Signed)
BP 128/60   Pulse 71   Temp (!) 97.1 F (36.2 C)   Ht '4\' 10"'$  (1.473 m)   Wt 120 lb (54.4 kg)   SpO2 99%   BMI 25.08 kg/m    Subjective:   Patient ID: [name], female    DOB: [birth year], 88 y.o.   MRN: [id number]  HPI: [name] is a 88 y.o. female presenting on 02/12/2022 for Medical Management of Chronic Issues   HPI Hypertension Patient is currently on amlodipine, and their blood pressure today is 128/60. Patient denies any lightheadedness or dizziness. Patient denies headaches, blurred vision, chest pains, shortness of breath, or weakness. Denies any side effects from medication and is content with current medication.   Hyperlipidemia Patient is coming in for recheck of his hyperlipidemia. The patient is currently taking atorvastatin. They deny any issues with myalgias or history of liver damage from it. They deny any focal numbness or weakness or chest pain.   Osteoporosis/osteopenia Fractures or history of fracture: None Medication: Fosamax Duration of treatment: 3 years Last bone density scan: 01/23/2020 Last T score: -3.6  Relevant past medical, surgical, family and social history reviewed and updated as indicated. Interim medical history since our last visit reviewed. Allergies and medications reviewed and updated.  Review of Systems  Constitutional:  Negative for chills and fever.  HENT:  Negative for congestion, ear discharge and ear pain.   Eyes:  Negative for redness and visual disturbance.  Respiratory:  Negative for chest tightness and shortness of breath.   Cardiovascular:  Negative for chest pain and leg swelling.  Genitourinary:  Negative for difficulty urinating and dysuria.  Musculoskeletal:  Positive for arthralgias and myalgias. Negative for back pain and gait problem.  Skin:  Negative for rash.  Neurological:  Negative for light-headedness and headaches.  Psychiatric/Behavioral:  Negative for agitation and behavioral problems.   All  other systems reviewed and are negative.   Per HPI unless specifically indicated above   Allergies as of 02/12/2022       Reactions   Penicillin G Hives   Lisinopril Swelling   Tongue swelling        Medication List        Accurate as of February 12, 2022  2:09 PM. If you have any questions, ask your nurse or doctor.          STOP taking these medications    bacitracin-polymyxin b ophthalmic ointment Commonly known as: POLYSPORIN Stopped by: Fransisca Kaufmann Chestine Belknap, MD   doxycycline 100 MG tablet Commonly known as: VIBRA-TABS Stopped by: Fransisca Kaufmann Ardelia Wrede, MD       TAKE these medications    alendronate 70 MG tablet Commonly known as: FOSAMAX Take 1 tablet (70 mg total) by mouth once a week. Take with a full glass of water on an empty stomach. What changed: See the new instructions. Changed by: Fransisca Kaufmann Devinn Voshell, MD   amLODipine 5 MG tablet Commonly known as: NORVASC Take 1 tablet (5 mg total) by mouth daily.   atorvastatin 40 MG tablet Commonly known as: LIPITOR Take 1 tablet (40 mg total) by mouth at bedtime.   diclofenac 75 MG EC tablet Commonly known as: VOLTAREN Take 1 tablet (75 mg total) by mouth 2 (two) times daily.   loratadine 10 MG tablet Commonly known as: CLARITIN Take 1 tablet (10 mg total) by mouth daily.   PROSTAT PO Take by mouth. Liquid prostat         Objective:  BP 128/60   Pulse 71   Temp (!) 97.1 F (36.2 C)   Ht '4\' 10"'$  (1.473 m)   Wt 120 lb (54.4 kg)   SpO2 99%   BMI 25.08 kg/m   Wt Readings from Last 3 Encounters:  02/12/22 120 lb (54.4 kg)  12/11/21 126 lb (57.2 kg)  12/11/21 125 lb 3.2 oz (56.8 kg)    Physical Exam Vitals and nursing note reviewed.  Constitutional:      General: She is not in acute distress.    Appearance: She is well-developed. She is not diaphoretic.  Eyes:     Conjunctiva/sclera: Conjunctivae normal.  Cardiovascular:     Rate and Rhythm: Normal rate and regular rhythm.     Heart  sounds: Normal heart sounds. No murmur heard. Pulmonary:     Effort: Pulmonary effort is normal. No respiratory distress.     Breath sounds: Normal breath sounds. No wheezing.  Musculoskeletal:        General: No tenderness. Normal range of motion.  Skin:    General: Skin is warm and dry.     Findings: No rash.  Neurological:     Mental Status: She is alert and oriented to person, place, and time.     Coordination: Coordination normal.  Psychiatric:        Behavior: Behavior normal.       Assessment & Plan:   Problem List Items Addressed This Visit       Cardiovascular and Mediastinum   HTN (hypertension) - Primary   Relevant Medications   amLODipine (NORVASC) 5 MG tablet   atorvastatin (LIPITOR) 40 MG tablet     Musculoskeletal and Integument   Osteoporosis   Relevant Medications   alendronate (FOSAMAX) 70 MG tablet   Other Relevant Orders   DG [facility] DEXA     Other   Dyslipidemia   Relevant Medications   atorvastatin (LIPITOR) 40 MG tablet   Other Visit Diagnoses     Primary osteoarthritis of left knee       Relevant Medications   diclofenac (VOLTAREN) 75 MG EC tablet   Seasonal allergies       Relevant Medications   loratadine (CLARITIN) 10 MG tablet       Continue current medicine, will do bone density scan today.  Refill her medicine.  Blood pressure looks good. Follow up plan: Return in about 6 months (around 08/14/2022), or if symptoms worsen or fail to improve, for Hypertension and osteoporosis and cholesterol..  Counseling provided for all of the vaccine components Orders Placed This Encounter  Procedures   DG [place] Taesha Goodell, MD [facility] 02/12/2022, 2:09 PM

## 2022-02-12 NOTE — Addendum Note (Signed)
Addended by: Alphonzo Dublin on: 02/12/2022 02:27 PM   Modules accepted: Orders

## 2022-02-13 DIAGNOSIS — J302 Other seasonal allergic rhinitis: Secondary | ICD-10-CM | POA: Diagnosis not present

## 2022-02-13 DIAGNOSIS — Z23 Encounter for immunization: Secondary | ICD-10-CM | POA: Diagnosis not present

## 2022-02-13 DIAGNOSIS — I10 Essential (primary) hypertension: Secondary | ICD-10-CM | POA: Diagnosis not present

## 2022-02-13 DIAGNOSIS — M810 Age-related osteoporosis without current pathological fracture: Secondary | ICD-10-CM | POA: Diagnosis not present

## 2022-02-13 LAB — CMP14+EGFR
ALT: 11 IU/L (ref 0–32)
AST: 23 IU/L (ref 0–40)
Albumin/Globulin Ratio: 1.4 (ref 1.2–2.2)
Albumin: 3.8 g/dL (ref 3.7–4.7)
Alkaline Phosphatase: 197 IU/L — ABNORMAL HIGH (ref 44–121)
BUN/Creatinine Ratio: 17 (ref 12–28)
BUN: 11 mg/dL (ref 8–27)
Bilirubin Total: 0.5 mg/dL (ref 0.0–1.2)
CO2: 22 mmol/L (ref 20–29)
Calcium: 8.5 mg/dL — ABNORMAL LOW (ref 8.7–10.3)
Chloride: 107 mmol/L — ABNORMAL HIGH (ref 96–106)
Creatinine, Ser: 0.66 mg/dL (ref 0.57–1.00)
Globulin, Total: 2.8 g/dL (ref 1.5–4.5)
Glucose: 93 mg/dL (ref 70–99)
Potassium: 4 mmol/L (ref 3.5–5.2)
Sodium: 143 mmol/L (ref 134–144)
Total Protein: 6.6 g/dL (ref 6.0–8.5)
eGFR: 85 mL/min/{1.73_m2} (ref >59)

## 2022-02-13 LAB — CBC WITH DIFFERENTIAL/PLATELET
Basophils Absolute: 0.1 10*3/uL (ref 0.0–0.2)
Basos: 1 %
EOS (ABSOLUTE): 0.3 10*3/uL (ref 0.0–0.4)
Eos: 4 %
Hematocrit: 35.1 % (ref 34.0–46.6)
Hemoglobin: 11.8 g/dL (ref 11.1–15.9)
Immature Grans (Abs): 0 10*3/uL (ref 0.0–0.1)
Immature Granulocytes: 0 %
Lymphocytes Absolute: 1.4 10*3/uL (ref 0.7–3.1)
Lymphs: 22 %
MCH: 29.4 pg (ref 26.6–33.0)
MCHC: 33.6 g/dL (ref 31.5–35.7)
MCV: 87 fL (ref 79–97)
Monocytes Absolute: 0.5 10*3/uL (ref 0.1–0.9)
Monocytes: 7 %
Neutrophils Absolute: 4.4 10*3/uL (ref 1.4–7.0)
Neutrophils: 66 %
Platelets: 347 10*3/uL (ref 150–450)
RBC: 4.02 x10E6/uL (ref 3.77–5.28)
RDW: 13.1 % (ref 11.7–15.4)
WBC: 6.6 10*3/uL (ref 3.4–10.8)

## 2022-02-13 LAB — LIPID PANEL
Chol/HDL Ratio: 4 ratio (ref 0.0–4.4)
Cholesterol, Total: 152 mg/dL (ref 100–199)
HDL: 38 mg/dL — ABNORMAL LOW (ref >39)
LDL Chol Calc (NIH): 97 mg/dL (ref 0–99)
Triglycerides: 92 mg/dL (ref 0–149)
VLDL Cholesterol Cal: 17 mg/dL (ref 5–40)

## 2022-02-19 ENCOUNTER — Other Ambulatory Visit: Payer: Self-pay

## 2022-02-19 ENCOUNTER — Other Ambulatory Visit: Payer: Self-pay | Admitting: Family Medicine

## 2022-02-19 DIAGNOSIS — M1991 Primary osteoarthritis, unspecified site: Secondary | ICD-10-CM

## 2022-02-26 ENCOUNTER — Telehealth: Payer: Self-pay

## 2022-02-26 NOTE — Progress Notes (Signed)
  Chronic Care Management   Note  02/26/2022 Name: [name] MRN: [id number] DOB: Mar 27, 1933  [name] is a 88 y.o. year old female who is a primary care patient of Dettinger, Fransisca Kaufmann, MD. I reached out to [name] by phone today in response to a referral sent by [name].  [name] was not successfully contacted today. A HIPAA compliant voice message was left requesting a return call.   Follow up plan: Additional outreach attempts will be made.  [name], [place],  [address] Direct Dial: [phone] [name]

## 2022-02-28 ENCOUNTER — Telehealth: Payer: Medicare Other

## 2022-02-28 NOTE — Progress Notes (Signed)
  Chronic Care Management   Note  02/28/2022 Name: Francenia Chimenti MRN: [id number] DOB: Mar 28, 1933  Verlie Liotta is a 88 y.o. year old female who is a primary care patient of Dettinger, Fransisca Kaufmann, MD. I reached out to Devota Pace by phone today in response to a referral sent by Ms. Chapel Rasnick's PCP.  Ms. Jaren Kearn  agreedto scheduling an appointment with the CCM RN Case Manager and Pharm D   Follow up plan: Patient agreed to scheduled appointment with RN Case Manager on 03/06/2022 and Pharm D 04/16/2022.   Noreene Larsson, [place], [place] [address] Direct Dial: [phone] [email or web address]

## 2022-03-06 ENCOUNTER — Ambulatory Visit (INDEPENDENT_AMBULATORY_CARE_PROVIDER_SITE_OTHER): Payer: Medicare Other | Admitting: *Deleted

## 2022-03-06 ENCOUNTER — Other Ambulatory Visit: Payer: Self-pay | Admitting: Family Medicine

## 2022-03-06 DIAGNOSIS — M810 Age-related osteoporosis without current pathological fracture: Secondary | ICD-10-CM

## 2022-03-06 DIAGNOSIS — I10 Essential (primary) hypertension: Secondary | ICD-10-CM

## 2022-03-06 DIAGNOSIS — M1991 Primary osteoarthritis, unspecified site: Secondary | ICD-10-CM

## 2022-03-06 NOTE — Progress Notes (Unsigned)
Placed new referral to [facility] for CCM

## 2022-03-17 NOTE — Plan of Care (Signed)
Chronic Care Management Provider Comprehensive Care Plan    03/17/2022 Name: Eilidh Marcano MRN: [id number] DOB: [birth year]  Referral to Chronic Care Management (CCM) services was placed by Provider:  Caryl Pina MD on Date: 02/19/22 and revised on 03/06/22.  Chronic Condition 1: HYPERTENSION Provider Assessment and Plan    HTN (hypertension) - Primary    Relevant Medications    amLODipine (NORVASC) 5 MG tablet    atorvastatin (LIPITOR) 40 MG tablet     Expected Outcome/Goals Addressed This Visit (Provider CCM goals/Provider Assessment and plan  CCM (HYPERTENSION) EXPECTED OUTCOME: MONITOR, SELF-MANAGE AND REDUCE SYMPTOMS OF HYPERTENSION  Symptom Management Condition 1: Take medications as prescribed   Attend all scheduled provider appointments Call pharmacy for medication refills 3-7 days in advance of running out of medications Perform all self care activities independently  Call provider office for new concerns or questions  check blood pressure weekly choose a place to take my blood pressure (home, clinic or office, retail store) write blood pressure results in a log or diary take blood pressure log to all doctor appointments keep all doctor appointments take medications for blood pressure exactly as prescribed begin an exercise program report new symptoms to your doctor eat more whole grains, fruits and vegetables, lean meats and healthy fats Continue doing your morning stretching routine Try to walk some daily if able Look over education via My Chart- low sodium diet Advanced directives packet mailed- please look over and complete  Chronic Condition 2: OSTEOPOROSIS Provider Assessment and Plan   Osteoporosis     Relevant Medications    alendronate (FOSAMAX) 70 MG tablet    Other Relevant Orders    DG [facility] DEXA    Expected Outcome/Goals Addressed This Visit (Provider CCM goals/Provider Assessment and plan  CCM (OSTEOPOROSIS) EXPECTED OUTCOME: MONITOR,  SELF-MANAGE AND REDUCE SYMPTOMS OF OSTEOPOROSIS  Symptom Management Condition 2: Take medications as prescribed   Attend all scheduled provider appointments Call pharmacy for medication refills 3-7 days in advance of running out of medications Perform all self care activities independently  Call provider office for new concerns or questions  Message was sent to your provider about inquiry about injection for osteoporosis fall prevention strategies: change position slowly, use assistive device such as walker or cane (per provider recommendations) when walking, keep walkways clear, have good lighting in room. It is important to contact your provider if you have any falls, maintain muscle strength/tone by exercise per provider recommendations.  Problem List Patient Active Problem List   Diagnosis Date Noted   Nail abnormality 12/11/2021   PAD (peripheral artery disease) ([place]) 11/13/2021   Language barrier 11/13/2021   Penicillin allergy 10/16/2021   Dyslipidemia 01/24/2021   Osteoporosis 07/25/2020   Rheumatoid arthritis involving multiple sites with positive rheumatoid factor ([place]) 05/10/2018   GERD (gastroesophageal reflux disease) 02/05/2018   Early stage nonexudative age-related macular degeneration of both eyes 08/06/2017   Thyroid nodule 02/21/2015   Dysarthria 02/19/2015   HTN (hypertension) 02/19/2015   History of TIA (transient ischemic attack) 02/19/2015   Impaired weight bearing     Medication Management  Current Outpatient Medications:    alendronate (FOSAMAX) 70 MG tablet, Take 1 tablet (70 mg total) by mouth once a week. Take with a full glass of water on an empty stomach., Disp: 12 tablet, Rfl: 3   amLODipine (NORVASC) 5 MG tablet, Take 1 tablet (5 mg total) by mouth daily., Disp: 90 tablet, Rfl: 3   atorvastatin (LIPITOR) 40 MG tablet, Take 1  tablet (40 mg total) by mouth at bedtime., Disp: 90 tablet, Rfl: 3   diclofenac (VOLTAREN) 75 MG EC tablet, Take 1 tablet (75  mg total) by mouth 2 (two) times daily., Disp: 180 tablet, Rfl: 3   loratadine (CLARITIN) 10 MG tablet, Take 1 tablet (10 mg total) by mouth daily., Disp: 90 tablet, Rfl: 3   Pollen Extracts (PROSTAT PO), Take by mouth. Liquid prostat (Patient not taking: Reported on 03/06/2022), Disp: , Rfl:   Cognitive Assessment Identity Confirmed: : Name; DOB Cognitive Status: Normal   Functional Assessment Hearing Difficulty or Deaf: no Wear Glasses or Blind: no Concentrating, Remembering or Making Decisions Difficulty (CP): no Difficulty Communicating: no Difficulty Eating/Swallowing: no Walking or Climbing Stairs Difficulty: -- (family assists) Walking or Climbing Stairs: ambulation difficulty, assistance 1 person Mobility Management: due to arthritis in feet,  uses walker Dressing/Bathing Difficulty: no Doing Errands Independently Difficulty (such as shopping) (CP): yes Errands Management: family assists   Caregiver Assessment  Primary Source of Support/Comfort: child(ren); spouse Name of Support/Comfort Primary Source: spouse [place] People in Home: spouse; child(ren), dependent Name(s) of People in Home: spouse and older son   Planned Interventions  Evaluation of current treatment plan related to hypertension self management and patient's adherence to plan as established by provider;   Reviewed prescribed diet low sodium Reviewed medications with patient and discussed importance of compliance;  Counseled on the importance of exercise goals with target of 150 minutes per week Discussed plans with patient for ongoing care management follow up and provided patient with direct contact information for care management team; Advised patient, providing education and rationale, to monitor blood pressure daily and record, calling PCP for findings outside established parameters;  Reviewed scheduled/upcoming provider appointments including:  Discussed complications of poorly controlled blood  pressure such as heart disease, stroke, circulatory complications, vision complications, kidney impairment, sexual dysfunction;  Screening for signs and symptoms of depression related to chronic disease state;  Assessed social determinant of health barriers;  Education sent via My Chart- low sodium diet Advanced directives packet mailed Evaluation of current treatment plan related to Osteoporosis and patient's adherence to plan as established by provider Advised patient to take fosamax as prescribed Provided education to patient and/or caregiver about advanced directives Provided education to patient re: safety precautions and fall prevention Reviewed medications with patient and discussed importance of taking as prescribed Reviewed scheduled/upcoming provider appointments including primary care provider in April 2024 Discussed plans with patient for ongoing care management follow up and provided patient with direct contact information for care management team Screening for signs and symptoms of depression related to chronic disease state  Assessed social determinant of health barriers Reviewed importance of eating healthy nutritious meals with adequate calories, protein, vegetables and fruit and preferably 3 meals per day In basket message sent to [facility] LPN at primary care provider office reporting patient states there is an injection she is supposed to get for osteoporosis and wants to know next steps  Interaction and coordination with outside resources, practitioners, and providers See CCM Referral  Care Plan: Available in MyChart

## 2022-03-17 NOTE — Chronic Care Management (AMB) (Signed)
Chronic Care Management   CCM RN Visit Note  03/17/2022 Name: [name] MRN: [id number] DOB: 1932/10/01  Subjective: [name] is a 88 y.o. year old female who is a primary care patient of Dettinger, Fransisca Kaufmann, MD. The patient was referred to the Chronic Care Management team for assistance with care management needs subsequent to provider initiation of CCM services and plan of care.    Today's Visit:  Engaged with patient by telephone for initial visit.     SDOH Interventions Today    Flowsheet Row Most Recent Value  SDOH Interventions   Food Insecurity Interventions Intervention Not Indicated  Housing Interventions Intervention Not Indicated  Transportation Interventions Intervention Not Indicated  Utilities Interventions Intervention Not Indicated  Financial Strain Interventions Intervention Not Indicated  Physical Activity Interventions Intervention Not Indicated  Stress Interventions Intervention Not Indicated  Social Connections Interventions Patient Refused         Goals Addressed             This Visit's Progress    CCM (HYPERTENSION) EXPECTED OUTCOME: MONITOR, SELF-MANAGE AND REDUCE SYMPTOMS OF HYPERTENSION       Current Barriers:  Knowledge Deficits related to Hypertension management Chronic Disease Management support and education needs related to Hypertension, diet Language Barrier No Advanced Directives in place- requests information be mailed Son [name] reports patient does not follow a special diet, drinks adequate fluids, mostly water, patient's daughter checks blood pressure at least once per week.  Planned Interventions: Evaluation of current treatment plan related to hypertension self management and patient's adherence to plan as established by provider;   Reviewed prescribed diet low sodium Reviewed medications with patient and discussed importance of compliance;  Counseled on the importance of exercise goals with target of 150 minutes per  week Discussed plans with patient for ongoing care management follow up and provided patient with direct contact information for care management team; Advised patient, providing education and rationale, to monitor blood pressure daily and record, calling PCP for findings outside established parameters;  Reviewed scheduled/upcoming provider appointments including:  Discussed complications of poorly controlled blood pressure such as heart disease, stroke, circulatory complications, vision complications, kidney impairment, sexual dysfunction;  Screening for signs and symptoms of depression related to chronic disease state;  Assessed social determinant of health barriers;  Education sent via My Chart- low sodium diet Advanced directives packet mailed  Symptom Management: Take medications as prescribed   Attend all scheduled provider appointments Call pharmacy for medication refills 3-7 days in advance of running out of medications Perform all self care activities independently  Call provider office for new concerns or questions  check blood pressure weekly choose a place to take my blood pressure (home, clinic or office, retail store) write blood pressure results in a log or diary take blood pressure log to all doctor appointments keep all doctor appointments take medications for blood pressure exactly as prescribed begin an exercise program report new symptoms to your doctor eat more whole grains, fruits and vegetables, lean meats and healthy fats Continue doing your morning stretching routine Try to walk some daily if able Look over education via My Chart- low sodium diet Advanced directives packet mailed- please look over and complete  Follow Up Plan: Telephone follow up appointment with care management team member scheduled for:  04/30/21 at 1045 am       CCM (OSTEOPOROSIS) EXPECTED OUTCOME: MONITOR, SELF-MANAGE AND REDUCE SYMPTOMS OF OSTEOPOROSIS       Current Barriers:  Knowledge  Deficits  related to Osteoporosis management Chronic Disease Management support and education needs related to Osteoporosis Language Barrier No Advanced Directives in place- requested information be mailed Spoke with patient's son [name] (patient speaks Spanish and son translates), pt lives with spouse and other son, is overall independent with most aspects of her care, has walker she uses due to osteoporosis and arthritis, has never driven, works outside in her garden, is able to bathe, cook, dress independently, pharmacist will be working with patient.  [name] reports pt has had one fall in the past year, no injury, reports pt eats only one meal per day  Planned Interventions: Evaluation of current treatment plan related to Osteoporosis and patient's adherence to plan as established by provider Advised patient to take fosamax as prescribed Provided education to patient and/or caregiver about advanced directives Provided education to patient re: safety precautions and fall prevention Reviewed medications with patient and discussed importance of taking as prescribed Reviewed scheduled/upcoming provider appointments including primary care provider in April 2024 Discussed plans with patient for ongoing care management follow up and provided patient with direct contact information for care management team Screening for signs and symptoms of depression related to chronic disease state  Assessed social determinant of health barriers Reviewed importance of eating healthy nutritious meals with adequate calories, protein, vegetables and fruit and preferably 3 meals per day In basket message sent to [facility] LPN at primary care provider office reporting patient states there is an injection she is supposed to get for osteoporosis and wants to know next steps  Symptom Management: Take medications as prescribed   Attend all scheduled provider appointments Call pharmacy for medication refills 3-7  days in advance of running out of medications Perform all self care activities independently  Call provider office for new concerns or questions  Message was sent to your provider about inquiry about injection for osteoporosis fall prevention strategies: change position slowly, use assistive device such as walker or cane (per provider recommendations) when walking, keep walkways clear, have good lighting in room. It is important to contact your provider if you have any falls, maintain muscle strength/tone by exercise per provider recommendations.  Follow Up Plan:  Telephone follow up appointment with care management team member scheduled for:  04/30/21 at 1045 am          Plan:Telephone follow up appointment with care management team member scheduled for:  04/30/21 at [place] am  Jacqlyn Larsen [facility], BSN RN Case Manager [place] [phone]

## 2022-03-17 NOTE — Patient Instructions (Signed)
Please call the care guide team at [phone] if you need to cancel or reschedule your appointment.   If you are experiencing a Mental Health or [place] or need someone to talk to, please call the Suicide and Crisis Lifeline: 988 call the [place] National Suicide Prevention Lifeline: 754-538-2356 or TTY: (416)173-2877 TTY 929-403-3981) to talk to a trained counselor call 1-800-273-TALK (toll free, 24 hour hotline) go to [facility] [place] [phone]) call the [facility]: [phone] call 911   Following is a copy of your full provider care plan:   Goals Addressed             This Visit's Progress    CCM (HYPERTENSION) EXPECTED OUTCOME: MONITOR, SELF-MANAGE AND REDUCE SYMPTOMS OF HYPERTENSION       Current Barriers:  Knowledge Deficits related to Hypertension management Chronic Disease Management support and education needs related to Hypertension, diet Language Barrier No Advanced Directives in place- requests information be mailed Son [name] reports patient does not follow a special diet, drinks adequate fluids, mostly water, patient's daughter checks blood pressure at least once per week.  Planned Interventions: Evaluation of current treatment plan related to hypertension self management and patient's adherence to plan as established by provider;   Reviewed prescribed diet low sodium Reviewed medications with patient and discussed importance of compliance;  Counseled on the importance of exercise goals with target of 150 minutes per week Discussed plans with patient for ongoing care management follow up and provided patient with direct contact information for care management team; Advised patient, providing education and rationale, to monitor blood pressure daily and record, calling PCP for findings outside established parameters;  Reviewed scheduled/upcoming provider appointments  including:  Discussed complications of poorly controlled blood pressure such as heart disease, stroke, circulatory complications, vision complications, kidney impairment, sexual dysfunction;  Screening for signs and symptoms of depression related to chronic disease state;  Assessed social determinant of health barriers;  Education sent via My Chart- low sodium diet Advanced directives packet mailed  Symptom Management: Take medications as prescribed   Attend all scheduled provider appointments Call pharmacy for medication refills 3-7 days in advance of running out of medications Perform all self care activities independently  Call provider office for new concerns or questions  check blood pressure weekly choose a place to take my blood pressure (home, clinic or office, retail store) write blood pressure results in a log or diary take blood pressure log to all doctor appointments keep all doctor appointments take medications for blood pressure exactly as prescribed begin an exercise program report new symptoms to your doctor eat more whole grains, fruits and vegetables, lean meats and healthy fats Continue doing your morning stretching routine Try to walk some daily if able Look over education via My Chart- low sodium diet Advanced directives packet mailed- please look over and complete  Follow Up Plan: Telephone follow up appointment with care management team member scheduled for:  04/30/21 at 1045 am       CCM (OSTEOPOROSIS) EXPECTED OUTCOME: MONITOR, SELF-MANAGE AND REDUCE SYMPTOMS OF OSTEOPOROSIS       Current Barriers:  Knowledge Deficits related to Osteoporosis management Chronic Disease Management support and education needs related to Osteoporosis Language Barrier No Advanced Directives in place- requested information be mailed Spoke with patient's son Ismelda Weatherman (patient speaks Spanish and son translates), pt lives with spouse and other son, is overall independent with  most aspects of her care,  has walker she uses due to osteoporosis and arthritis, has never driven, works outside in her garden, is able to bathe, cook, dress independently, pharmacist will be working with patient.  [name] reports pt has had one fall in the past year, no injury, reports pt eats only one meal per day  Planned Interventions: Evaluation of current treatment plan related to Osteoporosis and patient's adherence to plan as established by provider Advised patient to take fosamax as prescribed Provided education to patient and/or caregiver about advanced directives Provided education to patient re: safety precautions and fall prevention Reviewed medications with patient and discussed importance of taking as prescribed Reviewed scheduled/upcoming provider appointments including primary care provider in April 2024 Discussed plans with patient for ongoing care management follow up and provided patient with direct contact information for care management team Screening for signs and symptoms of depression related to chronic disease state  Assessed social determinant of health barriers Reviewed importance of eating healthy nutritious meals with adequate calories, protein, vegetables and fruit and preferably 3 meals per day In basket message sent to [facility] LPN at primary care provider office reporting patient states there is an injection she is supposed to get for osteoporosis and wants to know next steps  Symptom Management: Take medications as prescribed   Attend all scheduled provider appointments Call pharmacy for medication refills 3-7 days in advance of running out of medications Perform all self care activities independently  Call provider office for new concerns or questions  Message was sent to your provider about inquiry about injection for osteoporosis fall prevention strategies: change position slowly, use assistive device such as walker or cane (per provider recommendations)  when walking, keep walkways clear, have good lighting in room. It is important to contact your provider if you have any falls, maintain muscle strength/tone by exercise per provider recommendations.  Follow Up Plan:  Telephone follow up appointment with care management team member scheduled for:  04/30/21 at 1045 am          Patient verbalizes understanding of instructions and care plan provided today and agrees to view in [place]. Active MyChart status and patient understanding of how to access instructions and care plan via MyChart confirmed with patient.     Telephone follow up appointment with care management team member scheduled for:  04/30/22 at 63 am  It is important to avoid accidents which may result in broken bones.  Here are a few ideas on how to make your home safer so you will be less likely to trip or fall.  Use nonskid mats or non slip strips in your shower or tub, on your bathroom floor and around sinks.  If you know that you have spilled water, wipe it up! In the bathroom, it is important to have properly installed grab bars on the walls or on the edge of the tub.  Towel racks are NOT strong enough for you to hold onto or to pull on for support. Stairs and hallways should have enough light.  Add lamps or night lights if you need ore light. It is good to have handrails on both sides of the stairs if possible.  Always fix broken handrails right away. It is important to see the edges of steps.  Paint the edges of outdoor steps white so you can see them better.  Put colored tape on the edge of inside steps. Throw-rugs are dangerous because they can slide.  Removing the rugs is the best idea, but if they  must stay, add adhesive carpet tape to prevent slipping. Do not keep things on stairs or in the halls.  Remove small furniture that blocks the halls as it may cause you to trip.  Keep telephone and electrical cords out of the way where you walk. Always were sturdy, rubber-soled  shoes for good support.  Never wear just socks, especially on the stairs.  Socks may cause you to slip or fall.  Do not wear full-length housecoats as you can easily trip on the bottom.  Place the things you use the most on the shelves that are the easiest to reach.  If you use a stepstool, make sure it is in good condition.  If you feel unsteady, DO NOT climb, ask for help. If a health professional advises you to use a cane or walker, do not be ashamed.  These items can keep you from falling and breaking your bones.  Low-Sodium Eating Plan Sodium, which is an element that makes up salt, helps you maintain a healthy balance of fluids in your body. Too much sodium can increase your blood pressure and cause fluid and waste to be held in your body. Your health care provider or dietitian may recommend following this plan if you have high blood pressure (hypertension), kidney disease, liver disease, or heart failure. Eating less sodium can help lower your blood pressure, reduce swelling, and protect your heart, liver, and kidneys. What are tips for following this plan? Reading food labels The Nutrition Facts label lists the amount of sodium in one serving of the food. If you eat more than one serving, you must multiply the listed amount of sodium by the number of servings. Choose foods with less than 140 mg of sodium per serving. Avoid foods with 300 mg of sodium or more per serving. Shopping  Look for lower-sodium products, often labeled as "low-sodium" or "no salt added." Always check the sodium content, even if foods are labeled as "unsalted" or "no salt added." Buy fresh foods. Avoid canned foods and pre-made or frozen meals. Avoid canned, cured, or processed meats. Buy breads that have less than 80 mg of sodium per slice. Cooking  Eat more home-cooked food and less restaurant, buffet, and fast food. Avoid adding salt when cooking. Use salt-free seasonings or herbs instead of table salt or sea  salt. Check with your health care provider or pharmacist before using salt substitutes. Cook with plant-based oils, such as canola, sunflower, or olive oil. Meal planning When eating at a restaurant, ask that your food be prepared with less salt or no salt, if possible. Avoid dishes labeled as brined, pickled, cured, smoked, or made with soy sauce, miso, or teriyaki sauce. Avoid foods that contain MSG (monosodium glutamate). MSG is sometimes added to [place] food, bouillon, and some canned foods. Make meals that can be grilled, baked, poached, roasted, or steamed. These are generally made with less sodium. General information Most people on this plan should limit their sodium intake to 1,500-2,000 mg (milligrams) of sodium each day. What foods should I eat? Fruits Fresh, frozen, or canned fruit. Fruit juice. Vegetables Fresh or frozen vegetables. "No salt added" canned vegetables. "No salt added" tomato sauce and paste. Low-sodium or reduced-sodium tomato and vegetable juice. Grains Low-sodium cereals, including oats, puffed wheat and rice, and shredded wheat. Low-sodium crackers. Unsalted rice. Unsalted pasta. Low-sodium bread. Whole-grain breads and whole-grain pasta. Meats and other proteins Fresh or frozen (no salt added) meat, poultry, seafood, and fish. Low-sodium canned tuna and salmon.  Unsalted nuts. Dried peas, beans, and lentils without added salt. Unsalted canned beans. Eggs. Unsalted nut butters. Dairy Milk. Soy milk. Cheese that is naturally low in sodium, such as ricotta cheese, fresh mozzarella, or Swiss cheese. Low-sodium or reduced-sodium cheese. Cream cheese. Yogurt. Seasonings and condiments Fresh and dried herbs and spices. Salt-free seasonings. Low-sodium mustard and ketchup. Sodium-free salad dressing. Sodium-free light mayonnaise. Fresh or refrigerated horseradish. Lemon juice. Vinegar. Other foods Homemade, reduced-sodium, or low-sodium soups. Unsalted popcorn and  pretzels. Low-salt or salt-free chips. The items listed above may not be a complete list of foods and beverages you can eat. Contact a dietitian for more information. What foods should I avoid? Vegetables Sauerkraut, pickled vegetables, and relishes. Olives. [place] fries. Onion rings. Regular canned vegetables (not low-sodium or reduced-sodium). Regular canned tomato sauce and paste (not low-sodium or reduced-sodium). Regular tomato and vegetable juice (not low-sodium or reduced-sodium). Frozen vegetables in sauces. Grains Instant hot cereals. Bread stuffing, pancake, and biscuit mixes. Croutons. Seasoned rice or pasta mixes. Noodle soup cups. Boxed or frozen macaroni and cheese. Regular salted crackers. Self-rising flour. Meats and other proteins Meat or fish that is salted, canned, smoked, spiced, or pickled. Precooked or cured meat, such as sausages or meat loaves. [place]ce Salines. Ham. Pepperoni. Hot dogs. Corned beef. Chipped beef. Salt pork. Jerky. Pickled herring. Anchovies and sardines. Regular canned tuna. Salted nuts. Dairy Processed cheese and cheese spreads. Hard cheeses. Cheese curds. Blue cheese. Feta cheese. String cheese. Regular cottage cheese. Buttermilk. Canned milk. Fats and oils Salted butter. Regular margarine. Ghee. Bacon fat. Seasonings and condiments Onion salt, garlic salt, seasoned salt, table salt, and sea salt. Canned and packaged gravies. Worcestershire sauce. Tartar sauce. Barbecue sauce. Teriyaki sauce. Soy sauce, including reduced-sodium. Steak sauce. Fish sauce. Oyster sauce. Cocktail sauce. Horseradish that you find on the shelf. Regular ketchup and mustard. Meat flavorings and tenderizers. Bouillon cubes. Hot sauce. Pre-made or packaged marinades. Pre-made or packaged taco seasonings. Relishes. Regular salad dressings. Salsa. Other foods Salted popcorn and pretzels. Corn chips and puffs. Potato and tortilla chips. Canned or dried soups. Pizza. Frozen entrees and pot  pies. The items listed above may not be a complete list of foods and beverages you should avoid. Contact a dietitian for more information. Summary Eating less sodium can help lower your blood pressure, reduce swelling, and protect your heart, liver, and kidneys. Most people on this plan should limit their sodium intake to 1,500-2,000 mg (milligrams) of sodium each day. Canned, boxed, and frozen foods are high in sodium. Restaurant foods, fast foods, and pizza are also very high in sodium. You also get sodium by adding salt to food. Try to cook at home, eat more fresh fruits and vegetables, and eat less fast food and canned, processed, or prepared foods. This information is not intended to replace advice given to you by your health care provider. Make sure you discuss any questions you have with your health care provider. Document Revised: 05/13/2019 Document Reviewed: 03/09/2019 Elsevier Patient Education  [place].

## 2022-03-20 ENCOUNTER — Telehealth: Payer: Self-pay | Admitting: Family Medicine

## 2022-03-20 DIAGNOSIS — I10 Essential (primary) hypertension: Secondary | ICD-10-CM

## 2022-03-20 DIAGNOSIS — M1991 Primary osteoarthritis, unspecified site: Secondary | ICD-10-CM

## 2022-03-20 NOTE — Telephone Encounter (Signed)
Son has been informed that pt needs to discuss Prolia with Almyra Free our pharmacist prior to the [facility] being prescribed. Pt has an appt scheduled for 1/10 with Almyra Free. Advised for pt to continue Fosamax and could add vit d and Ca if they wish. Per [place] advised that pt should see a dentist prior to starting polia just in case of dental concerns that could arise after starting the injection. Son states that pt has not seen a dentis in " a while" . They will hold off in scheduling with dentist because pt does not have any concerns.  Son understood all and has no further concerns.

## 2022-03-20 NOTE — Telephone Encounter (Signed)
Patient's son calling to talk to Caryl Pina about setting up the shot they discuss a few weeks ago. He was not sure of the name. Please call back and advise.

## 2022-04-08 ENCOUNTER — Ambulatory Visit (INDEPENDENT_AMBULATORY_CARE_PROVIDER_SITE_OTHER): Payer: Medicare Other | Admitting: *Deleted

## 2022-04-08 VITALS — Ht <= 58 in | Wt 120.0 lb

## 2022-04-08 DIAGNOSIS — Z0000 Encounter for general adult medical examination without abnormal findings: Secondary | ICD-10-CM

## 2022-04-08 NOTE — Patient Instructions (Signed)
  [place] Maintenance Summary and Written Plan of Care  Ms. [name] ,  Thank you for allowing me to perform your Medicare Annual Wellness Visit and for your ongoing commitment to your health.   Health Maintenance & Immunization History Health Maintenance  Topic Date Due   Zoster Vaccines- Shingrix (1 of 2) Never done   Pneumonia Vaccine 98+ Years old (2 - PPSV23 or PCV20) 07/25/2021   COVID-19 Vaccine (4 - 2023-24 season) 12/20/2021   Medicare Annual Wellness (AWV)  04/09/2023   DEXA SCAN  02/14/2024   DTaP/Tdap/Td (2 - Td or Tdap) 01/17/2027   INFLUENZA VACCINE  Completed   HPV VACCINES  Aged Out   Immunization History  Administered Date(s) Administered   Fluad Quad(high Dose 65+) 01/23/2020, 01/24/2021, 02/13/2022   Influenza, High Dose Seasonal PF 02/05/2018   Moderna Sars-Covid-2 Vaccination 06/27/2019, 07/25/2019   PFIZER(Purple Top)SARS-COV-2 Vaccination 04/25/2020   Pneumococcal Conjugate-13 07/25/2020   Tdap 01/16/2017    These are the patient goals that we discussed:  Goals Addressed               This Visit's Progress     patient (pt-stated)        04/08/2022 AWV Goal: Keep All Scheduled Appointments  Over the next year, patient will attend all scheduled appointments with their PCP and any specialists that they see.          This is a list of Health Maintenance Items that are overdue or due now: Health Maintenance Due  Topic Date Due   Zoster Vaccines- Shingrix (1 of 2) Never done   Pneumonia Vaccine 30+ Years old (2 - PPSV23 or PCV20) 07/25/2021   COVID-19 Vaccine (4 - 2023-24 season) 12/20/2021

## 2022-04-08 NOTE — Progress Notes (Addendum)
I connected with  [name] on 04/08/22 by a audio enabled telemedicine application and verified that I am speaking with the correct person using two identifiers.  Patient Location: Home  Provider Location: Office/Clinic  I discussed the limitations of evaluation and management by telemedicine. The patient expressed understanding and agreed to proceed.  Subjective:   [name] is a 88 y.o. female who presents for Medicare Annual (Subsequent) preventive examination.  Review of Systems     Cardiac Risk Factors include: advanced age (>66mn, >>69women);dyslipidemia;hypertension;sedentary lifestyle     Objective:    Today's Vitals   04/08/22 1203  Weight: 120 lb (54.4 kg)  Height: '4\' 10"'$  (1.473 m)  PainSc: 0-No pain   Body mass index is 25.08 kg/m.     04/08/2022   12:12 PM 03/06/2022   10:04 AM 02/18/2021    3:36 PM 01/29/2016    6:27 AM 01/01/2016    7:56 AM 12/28/2015   12:56 PM 06/18/2015    3:10 PM  Advanced Directives  Does Patient Have a Medical Advance Directive? No No No No No No No  Would patient like information on creating a medical advance directive? Yes (ED - Information included in AVS) Yes (MAU/Ambulatory/Procedural Areas - Information given) No - Patient declined No - patient declined information No - patient declined information  No - patient declined information    Current Medications (verified) Outpatient Encounter Medications as of 04/08/2022  Medication Sig   alendronate (FOSAMAX) 70 MG tablet Take 1 tablet (70 mg total) by mouth once a week. Take with a full glass of water on an empty stomach.   amLODipine (NORVASC) 5 MG tablet Take 1 tablet (5 mg total) by mouth daily.   atorvastatin (LIPITOR) 40 MG tablet Take 1 tablet (40 mg total) by mouth at bedtime.   diclofenac (VOLTAREN) 75 MG EC tablet Take 1 tablet (75 mg total) by mouth 2 (two) times daily.   loratadine (CLARITIN) 10 MG tablet Take 1 tablet (10 mg total) by mouth daily. (Patient not  taking: Reported on 04/08/2022)   Pollen Extracts (PROSTAT PO) Take by mouth. Liquid prostat (Patient not taking: Reported on 03/06/2022)   No facility-administered encounter medications on file as of 04/08/2022.    Allergies (verified) Penicillin g and Lisinopril   History: Past Medical History:  Diagnosis Date   Arthritis of right foot    Hx: UTI (urinary tract infection)    Hypertension    Osteoporosis    Palpitations    Unsteady gait    Past Surgical History:  Procedure Laterality Date   CATARACT EXTRACTION W/PHACO Right 01/01/2016   Procedure: CATARACT EXTRACTION PHACO AND INTRAOCULAR LENS PLACEMENT RIGHT EYE;  Surgeon: MRutherford Guys MD;  Location: [facility];  Service: Ophthalmology;  Laterality: Right;  CDE: 9.69   CATARACT EXTRACTION W/PHACO Left 01/29/2016   Procedure: CATARACT EXTRACTION PHACO AND INTRAOCULAR LENS PLACEMENT (IOC);  Surgeon: MRutherford Guys MD;  Location: [facility];  Service: Ophthalmology;  Laterality: Left;  CDE: 9.32   None     Family History  Problem Relation Age of Onset   Heart attack Mother 548      [place]  Healthy Daughter    Healthy Son    Healthy Son    Healthy Daughter    Allergic rhinitis Neg Hx    Angioedema Neg Hx    Asthma Neg Hx    Eczema Neg Hx    Immunodeficiency Neg Hx    Urticaria Neg Hx  Social History   Socioeconomic History   Marital status: Married    Spouse name: [name]   Number of children: 4   Years of education: Not on file   Highest education level: 3rd grade  Occupational History   Occupation: Homemaker  Tobacco Use   Smoking status: Never   Smokeless tobacco: Never  Vaping Use   Vaping Use: Never used  Substance and Sexual Activity   Alcohol use: No    Alcohol/week: 0.0 standard drinks of alcohol   Drug use: Never   Sexual activity: Not on file  Other Topics Concern   Not on file  Social History Narrative   Lives at home with son  and sister in law and husband.     1 cup coffee per day.    Right-handed.   Social Determinants of Health   Financial Resource Strain: Low Risk  (04/08/2022)   Overall Financial Resource Strain (CARDIA)    Difficulty of Paying Living Expenses: Not hard at all  Food Insecurity: No Food Insecurity (04/08/2022)   Hunger Vital Sign    Worried About Running Out of Food in the Last Year: Never true    Ran Out of Food in the Last Year: Never true  Transportation Needs: No Transportation Needs (04/08/2022)   PRAPARE - Hydrologist (Medical): No    Lack of Transportation (Non-Medical): No  Physical Activity: Insufficiently Active (04/08/2022)   Exercise Vital Sign    Days of Exercise per Week: 7 days    Minutes of Exercise per Session: 20 min  Stress: No Stress Concern Present (04/08/2022)   [place]    Feeling of Stress : Not at all  Social Connections: Moderately Integrated (04/08/2022)   Social Connection and Isolation Panel [NHANES]    Frequency of Communication with Friends and Family: More than three times a week    Frequency of Social Gatherings with Friends and Family: Once a week    Attends Religious Services: Never    Marine scientist or Organizations: Yes    Attends Archivist Meetings: Never    Marital Status: Married  Recent Concern: Social Connections - Moderately Isolated (03/06/2022)   Social Connection and Isolation Panel [NHANES]    Frequency of Communication with Friends and Family: More than three times a week    Frequency of Social Gatherings with Friends and Family: Once a week    Attends Religious Services: Never    Marine scientist or Organizations: No    Attends Music therapist: Never    Marital Status: Married      Clinical Intake:  Pre-visit preparation completed: Yes  Pain : No/denies pain Pain Score: 0-No pain     BMI - recorded: 25.08 Nutritional Status: BMI 25 -29  Overweight Nutritional Risks: None Diabetes: No  How often do you need to have someone help you when you read instructions, pamphlets, or other written materials from your doctor or pharmacy?: 1 - Never What is the last grade level you completed in school?: 3rd  Diabetic? no  Interpreter Needed?: Yes (Son Charm Barges) Patient Declined Interpreter : No Patient signed [place] waiver: No  Information entered by :: Baldomero Lamy, LPN   Activities of Daily Living    04/08/2022   12:13 PM  In your present state of health, do you have any difficulty performing the following activities:  Hearing? 0  Vision? 1  Comment amacular degeneration  Difficulty concentrating or making decisions? 1  Comment some memory issues  Walking or climbing stairs? 1  Comment stairs are difficult  Dressing or bathing? 0  Doing errands, shopping? 1  Comment does not drive, children run errands for her  Preparing Food and eating ? N  Using the Toilet? N  In the past six months, have you accidently leaked urine? N  Do you have problems with loss of bowel control? N  Managing your Medications? N  Comment children help  Managing your Finances? N  Housekeeping or managing your Housekeeping? N    Patient Care Team: Dettinger, Fransisca Kaufmann, MD as PCP - General (Family Medicine) Kassie Mends, RN as [place] any recent [place] you may have received from other than [facility] providers in the past year (date may be approximate).     Assessment:   This is a routine wellness examination for [place].   Dietary issues and exercise activities discussed: Current Exercise Habits: Home exercise routine, Type of exercise: walking;stretching, Time (Minutes): 20, Frequency (Times/Week): 7, Weekly Exercise (Minutes/Week): 140, Intensity: Mild, Exercise limited by: None identified   Goals Addressed               This Visit's Progress     patient (pt-stated)         04/08/2022 AWV Goal: Keep All Scheduled Appointments  Over the next year, patient will attend all scheduled appointments with their PCP and any specialists that they see.       Depression Screen    04/08/2022   12:17 PM 03/06/2022    9:56 AM 02/12/2022    1:52 PM 10/16/2021    9:29 AM 08/09/2021    4:28 PM 07/25/2021    4:00 PM 07/11/2021    4:27 PM  PHQ 2/9 Scores  PHQ - 2 Score 0 0 0 0 0 0 0    Fall Risk    04/08/2022   12:12 PM 03/06/2022   10:03 AM 02/12/2022    1:52 PM 10/16/2021    9:22 AM 08/09/2021    4:28 PM  Fall Risk   Falls in the past year? 1 1 0 0 0  Number falls in past yr: 0 0     Injury with Fall? 0 0     Risk for fall due to : History of fall(s);Impaired balance/gait   Impaired balance/gait;Orthopedic patient;Impaired mobility   Risk for fall due to: Comment    2 ww, boot on L foot   Follow up Falls evaluation completed   Falls evaluation completed     [place]:  Any stairs in or around the home? Yes  If so, are there any without handrails? Yes  Home free of loose throw rugs in walkways, pet beds, electrical cords, etc? No  Adequate lighting in your home to reduce risk of falls? Yes   ASSISTIVE DEVICES UTILIZED TO PREVENT FALLS:  Life alert? No  Use of a cane, walker or w/c? Yes  Grab bars in the bathroom? Yes  Shower chair or bench in shower? No  Elevated toilet seat or a handicapped toilet? No   Cognitive Function:    06/18/2015    3:10 PM 01/05/2015    1:22 PM  MMSE - Mini Mental State Exam  Orientation to time 5 5  Orientation to Place 5 5  Registration 3 2  Attention/ Calculation 5 5  Recall 3 3  Language- name 2 objects 2 2  Language- repeat 1 1  Language- follow 3 step command 3 3  Language- read & follow direction 1 1  Write a sentence 1 1  Copy design 1 1  Total score 30 29        04/08/2022   12:17 PM 02/18/2021    3:45 PM  6CIT Screen  What Year? 0 points 0 points  What month? 0  points 0 points  What time? 0 points 0 points  Count back from 20 0 points 0 points  Months in reverse 4 points 4 points  Repeat phrase 6 points 4 points  Total Score 10 points 8 points    Immunizations Immunization History  Administered Date(s) Administered   Fluad Quad(high Dose 65+) 01/23/2020, 01/24/2021, 02/13/2022   Influenza, High Dose Seasonal PF 02/05/2018   Moderna Sars-Covid-2 Vaccination 06/27/2019, 07/25/2019   PFIZER(Purple Top)SARS-COV-2 Vaccination 04/25/2020   Pneumococcal Conjugate-13 07/25/2020   Tdap 01/16/2017      Screening Tests Health Maintenance  Topic Date Due   Zoster Vaccines- Shingrix (1 of 2) Never done   Pneumonia Vaccine 40+ Years old (2 - PPSV23 or PCV20) 07/25/2021   COVID-19 Vaccine (4 - 2023-24 season) 12/20/2021   Medicare Annual Wellness (AWV)  04/09/2023   DEXA SCAN  02/14/2024   DTaP/Tdap/Td (2 - Td or Tdap) 01/17/2027   INFLUENZA VACCINE  Completed   HPV VACCINES  Aged Out    Health Maintenance  Health Maintenance Due  Topic Date Due   Zoster Vaccines- Shingrix (1 of 2) Never done   Pneumonia Vaccine 98+ Years old (2 - PPSV23 or PCV20) 07/25/2021   COVID-19 Vaccine (4 - 2023-24 season) 12/20/2021        Plan:     I have personally reviewed and noted the following in the patient's chart:   Medical and social history Use of alcohol, tobacco or illicit drugs  Current medications and supplements including opioid prescriptions. Patient is not currently taking opioid prescriptions. Functional ability and status Nutritional status Physical activity Advanced directives List of other physicians Hospitalizations, surgeries, and ER visits in previous 12 months Vitals Screenings to include cognitive, depression, and falls Referrals and appointments  In addition, I have reviewed and discussed with patient certain preventive protocols, quality metrics, and best practice recommendations. A written personalized care plan for  preventive services as well as general preventive health recommendations were provided to patient.     Willette Pa, LPN   [id number]   Nurse Notes: After visit summary mailed to patient.

## 2022-04-16 ENCOUNTER — Ambulatory Visit (INDEPENDENT_AMBULATORY_CARE_PROVIDER_SITE_OTHER): Payer: Medicare Other | Admitting: Pharmacist

## 2022-04-16 ENCOUNTER — Telehealth: Payer: Self-pay | Admitting: Pharmacist

## 2022-04-16 DIAGNOSIS — M810 Age-related osteoporosis without current pathological fracture: Secondary | ICD-10-CM | POA: Diagnosis not present

## 2022-04-16 MED ORDER — DENOSUMAB 60 MG/ML ~~LOC~~ SOSY: 60.0000 mg | PREFILLED_SYRINGE | SUBCUTANEOUS | 2 refills | Status: DC

## 2022-04-16 MED ORDER — DENOSUMAB 60 MG/ML ~~LOC~~ SOSY: 60.0000 mg | PREFILLED_SYRINGE | Freq: Once | SUBCUTANEOUS | 0 refills | Status: DC

## 2022-04-16 NOTE — Telephone Encounter (Signed)
CLINICAL DATA:  88 year old Female Postmenopausal. Osteoporosis   History of fragility fracture. Patient is or has been on bone building therapies. Patient with T score of -3.9 (R hip) dexa 02/12/22 Already on alendronate once weekly Recommend [facility]lia[facility] Starting calcium and vitamin D Should be covered under [facility] medicare/medicaid, but may need PA Will send to prolia pools--can likely send to RX PA pools Dental risk/ONJ discussed at length with patient's son Continue alendronate until prolia started

## 2022-04-16 NOTE — Progress Notes (Signed)
    04/16/22 Name: [name] MRN: [id number] DOB: May 01, 1932   45 yoM presents to clinic for osteoporosis education and management  Current Height:    4'  10  Max Lifetime Height:  4'10 Current Weight:         Ethnicity:Hispanic    HPI: Does pt already have a diagnosis of:  Osteoporosis?  Yes  Back Pain?  No       Kyphosis?  No Prior fracture?  Yes fragility fractures Med(s) for Osteoporosis/Osteopenia:  fosamax,ca/vitD                                                             PMH: Hysterectomy?  No Oophorectomy?  No HRT? No Steroid Use?  No Thyroid med?  No History of cancer?  No History of digestive disorders (ie Crohn's)?  No Current or previous eating disorders?  No Last Vitamin D Result:  33.1 (10/22) Last GFR Result:  18   FH/SH: Family history of osteoporosis?  No Parent with history of hip fracture?  No Family history of breast cancer?  No Exercise?  No Smoking?  No Alcohol?  No    Calcium Assessment Does not currently take or get adequate supply of dietary calcium DEXA Results COMPARISON:  01/23/2020.   FINDINGS: Scan quality: Good.   LUMBAR SPINE (L1-L4):  BMD (in g/cm2): 0.821  T-score: -3.0  Z-score: -0.7   LEFT FEMORAL NECK:  BMD (in g/cm2): 0.740  T-score: -2.1  Z-score: 0.5   LEFT TOTAL HIP:  BMD (in g/cm2): 0.645  T-score: -2.9  Z-score: -0.3   RIGHT FEMORAL NECK:  BMD (in g/cm2): 0.601  T-score: -3.1 Z-score: -0.5   RIGHT TOTAL HIP: BMD (in g/cm2): 0.512  T-score: -3.9  Z-score: -1.3   Dual femur rate of change from previous exam: 6.1 %   FRAX 10-YEAR PROBABILITY OF FRACTURE:   Patient does not meet criteria for FRAX assessment.   Assessment: History of fragility fracture. Patient is or has been on bone building therapies. Patient with T score of -3.9 (R hip) Already on alendronate Recommend prolia Starting calcium and vitamin D Should be covered under [facility] medicare/medicaid, but may need PA Will send to  prolia pools--can likely send to RX PA pools   Recommendations: 1.  Continue fosamax until we get prolia approved-pending insurance prolia 2.  continue calcium '1200mg'$  daily through supplementation or diet.  3.  continue weight bearing exercise - 30 minutes at least 4 days per week.   4.  Counseled and educated about fall risk and prevention.  Recheck DEXA:  2 years  Time spent counseling patient:  25 minutes        CLINICAL DATA:  88 year old Female Postmenopausal. Osteoporosis

## 2022-04-25 ENCOUNTER — Telehealth: Payer: Self-pay | Admitting: *Deleted

## 2022-04-30 ENCOUNTER — Telehealth: Payer: Medicare Other

## 2022-04-30 ENCOUNTER — Telehealth: Payer: Self-pay | Admitting: *Deleted

## 2022-04-30 NOTE — Telephone Encounter (Signed)
    CCM RN Visit Note  04/30/2022 Name: [name] MRN: [id number]      DOB: [birth year]  Subjective: [name] is a 89 y.o. year old female who is a primary care patient of Caryl Pina MD. The patient was referred to the Chronic Care Management team for assistance with care management needs subsequent to provider initiation of CCM services and plan of care.      An unsuccessful telephone outreach was attempted today to contact the patient about Chronic Care Management needs.    Plan:Telephone follow up appointment with care management team member scheduled for:  upon care guide rescheduling.  Jacqlyn Larsen RNC, BSN RN Case Manager [place] [phone]

## 2022-05-05 NOTE — Telephone Encounter (Signed)
Per [name], send to pharmacy

## 2022-05-12 ENCOUNTER — Ambulatory Visit (INDEPENDENT_AMBULATORY_CARE_PROVIDER_SITE_OTHER): Payer: 59 | Admitting: *Deleted

## 2022-05-12 DIAGNOSIS — M810 Age-related osteoporosis without current pathological fracture: Secondary | ICD-10-CM | POA: Diagnosis not present

## 2022-05-12 MED ORDER — DENOSUMAB 60 MG/ML ~~LOC~~ SOSY
60.0000 mg | PREFILLED_SYRINGE | Freq: Once | SUBCUTANEOUS | Status: AC
  Administered 2022-05-12: 60 mg via SUBCUTANEOUS

## 2022-05-12 NOTE — Telephone Encounter (Signed)
Prolia given 01/22

## 2022-05-12 NOTE — Patient Instructions (Signed)
Denosumab Injection (Osteoporosis) Qu es este medicamento? El DENOSUMAB previene y trata la osteoporosis. Acta fortaleciendo los Toftrees y hacindolos menos propensos a quebrarse (fractura). Es un anticuerpo monoclonal. Cindra Presume medicamento puede ser utilizado para otros usos; si tiene alguna pregunta consulte con su proveedor de atencin mdica o con su farmacutico. MARCAS COMUNES: Prolia Qu le debo informar a mi profesional de la salud antes de tomar este medicamento? Necesitan saber si usted presenta alguno de los siguientes problemas o situaciones: Patent examiner o de las encas, o planea someterse a una Engineer, water o a que le extraigan un diente Infeccin Enfermedad renal Niveles bajos de calcio o vitamina D en la sangre Est en dilisis Desnutricin Afecciones de la piel Enfermedad tiroidea o ha tenido [place] de tiroides o paratiroides Problemas para Health visitor en el estmago o el intestino Una reaccin inusual al denosumab, a otros medicamentos, alimentos, colorantes o conservantes Si est embarazada o buscando quedar embarazada Si est amamantando a un beb Cmo debo Insurance account manager medicamento? Este medicamento se inyecta debajo de la piel. Su equipo de atencin lo [place] en un hospital o en un entorno clnico. Se le entregar una Gua del medicamento (MedGuide, su nombre en ingls) especial antes de cada tratamiento. Asegrese de leer esta informacin cada vez cuidadosamente. Hable con su equipo de atencin sobre el uso de este medicamento en nios. Puede requerir atencin especial. Sobredosis: Pngase en contacto inmediatamente con un centro toxicolgico o una sala de urgencia si usted cree que haya tomado demasiado medicamento. ATENCIN: [employer] Foods es solo para usted. No comparta este medicamento con nadie. Qu sucede si me olvido de una dosis? Cumpla con las citas para dosis de seguimiento. Es importante no olvidar ninguna dosis. Llame a su equipo de  atencin si no puede asistir a una cita. Qu puede interactuar con este medicamento? No use este medicamento con ninguno de los siguientes productos: Otros medicamentos que contienen denosumab Este medicamento tambin podra Counselling psychologist con los siguientes productos: Medicamentos que disminuyen las posibilidades de Restaurant manager, fast food contra una infeccin Medicamentos esteroideos, tales como la prednisona o la cortisona Puede ser que esta lista no menciona todas las posibles interacciones. Informe a su profesional de KB Home	[place] de [employer] productos a base de hierbas, medicamentos de [place] o suplementos nutritivos que est tomando. Si usted fuma, consume bebidas alcohlicas o si utiliza drogas ilegales, indqueselo tambin a su profesional de KB Home	[place]. Algunas sustancias pueden interactuar con su medicamento. A qu debo estar atento al usar Coca-Cola? Se supervisar su estado de salud atentamente mientras reciba este medicamento. Usted podra necesitar realizarse [employer] de sangre mientras est usando [place]. Este medicamento puede aumentar su riesgo de contraer una infeccin. Llame para pedir consejo a su equipo de atencin si tiene fiebre, escalofros, dolor de garganta o cualquier otro sntoma de resfriado o gripe. No se trate usted mismo. Trate de no acercarse a personas que estn enfermas. Informe a su dentista y a su Environmental education officer que est usando este medicamento. No deben realizarle [place] Hoof dental mayor mientras [place] este medicamento. Antes de comenzar a Solicitor, visite a su dentista para Warehouse manager un examen y arregle Database administrator. Cuide bien sus dientes mientras [place] este medicamento. Asegrese de visitar a su dentista para citas de seguimiento peridicas. Debe asegurarse de recibir suficiente calcio y vitamina D mientras [place] este medicamento. Converse con su equipo de atencin McDonald[employer] Corporation que come y las vitaminas que toma. Informe a su  equipo  de atencin si est en embarazo o cree que podra estarlo. Este medicamento puede causar defectos congnitos graves si se [place] durante el embarazo y por 5 meses despus de la ltima dosis. Deber realizarse una prueba de embarazo y obtener resultado negativo antes de Medical laboratory scientific officer a Solicitor. Se recomienda utilizar un mtodo anticonceptivo mientras est usando este medicamento y por 5 meses despus de la ltima dosis. Su equipo de atencin mdica puede ayudarle a Pension scheme manager la opcin que mejor se adapte a sus necesidades. Hable con su equipo de atencin antes de amamantar. Es posible que sea Chartered loss adjuster cambios en su plan de tratamiento. Qu efectos secundarios puedo tener al [employer] este medicamento? Efectos secundarios que debe informar a su equipo de atencin tan pronto como sea posible: Reacciones alrgicas: erupcin cutnea, comezn/picazn, urticaria, hinchazn de la cara, los labios, la lengua o la garganta Infeccin: fiebre, escalofros, tos, dolor de garganta, heridas que no sanan, dolor o problemas para Garment/textile technologist, sensacin general de molestia o Hydrologist bajo de calcio: dolor o calambres musculares, confusin, hormigueo o entumecimiento en manos o pies Osteonecrosis de la mandbula: dolor, hinchazn o enrojecimiento en la boca, entumecimiento de la [place], mala cicatrizacin despus de un trabajo odontolgico, secrecin inusual de la boca, huesos visibles en la boca Dolor intenso en los [place], las articulaciones o los msculos Infeccin de la piel: piel enrojecida, hinchazn, calor o dolor Efectos secundarios que generalmente no requieren atencin mdica (debe informarlos a su equipo de atencin si persisten o si son molestos): Dolor de Futures trader de Special educational needs teacher en las Chartered certified accountant en las manos, los brazos, las piernas o los pies Goteo o congestin nasal Dolor de garganta Puede ser que esta lista no menciona todos los posibles  efectos secundarios. Comunquese a su mdico por asesoramiento mdico Humana Inc. Usted puede informar los efectos secundarios a la FDA por telfono al 1-800-FDA-1088. Dnde debo guardar mi medicina? Este medicamento se administra en hospitales o clnicas. No se guarda en su casa. ATENCIN: Este folleto es un resumen. Puede ser que no cubra toda la posible informacin. Si usted tiene preguntas acerca de esta medicina, consulte con su mdico, su farmacutico o su profesional de Technical sales engineer.  2023 Elsevier/Gold Standard (2021-10-28 00:00:00)

## 2022-05-12 NOTE — Progress Notes (Signed)
Prolia given and patient tolerated well.  

## 2022-05-15 ENCOUNTER — Telehealth: Payer: Self-pay

## 2022-05-15 NOTE — Progress Notes (Unsigned)
  Chronic Care Management Note  05/15/2022 Name: [name] MRN: [id number] DOB: [birth year]  [name] is a 89 y.o. year old female who is a primary care patient of Dettinger, Fransisca Kaufmann, MD and is actively engaged with the Chronic Care Management team. I reached out to [name] by phone today to assist with re-scheduling a follow up visit with the RN Case Manager  Follow up plan: Unsuccessful telephone outreach attempt made. A HIPAA compliant phone message was left for the patient providing contact information and requesting a return call.  The care management team will reach out to the patient again over the next 7 days.  If patient returns call to provider office, please advise to call [facility]  at [address], [place], [place] [address] Direct Dial: [phone] [name]

## 2022-05-23 NOTE — Progress Notes (Unsigned)
  Chronic Care Management Note  05/23/2022 Name: [name] MRN: [id number] DOB: 1932/06/13  [name] is a 89 y.o. year old female who is a primary care patient of Dettinger, Fransisca Kaufmann, MD and is actively engaged with the Chronic Care Management team. I reached out to Devota Pace by phone today to assist with re-scheduling a follow up visit with the RN Case Manager  Follow up plan: Unsuccessful telephone outreach attempt made. A HIPAA compliant phone message was left for the patient providing contact information and requesting a return call.  The care management team will reach out to the patient again over the next 7 days.  If patient returns call to provider office, please advise to call [facility]  at [address], [place], [place] [address] Direct Dial: [phone] [email or web address]

## 2022-05-27 NOTE — Progress Notes (Signed)
  Chronic Care Management Note  05/27/2022 Name: Chonita Gadea MRN: [id number] DOB: 02/17/1933  [name] is a 89 y.o. year old female who is a primary care patient of Dettinger, Fransisca Kaufmann, MD and is actively engaged with the Chronic Care Management team. I reached out to Devota Pace by phone today to assist with re-scheduling a follow up visit with the RN Case Manager  Follow up plan: Telephone appointment with care management team member scheduled for:05/28/2022  Noreene Larsson, [facility]  [place], [place] [address] Direct Dial: [phone] [email or web address]

## 2022-05-28 ENCOUNTER — Ambulatory Visit (INDEPENDENT_AMBULATORY_CARE_PROVIDER_SITE_OTHER): Payer: Medicaid Other | Admitting: *Deleted

## 2022-05-28 DIAGNOSIS — M810 Age-related osteoporosis without current pathological fracture: Secondary | ICD-10-CM

## 2022-05-28 DIAGNOSIS — I10 Essential (primary) hypertension: Secondary | ICD-10-CM

## 2022-05-28 NOTE — Chronic Care Management (AMB) (Signed)
Chronic Care Management   CCM RN Visit Note  05/28/2022 Name: [name] MRN: [id number] DOB: [birth year]  Subjective: [name] is a 89 y.o. year old female who is a primary care patient of Dettinger, Fransisca Kaufmann, MD. The patient was referred to the Chronic Care Management team for assistance with care management needs subsequent to provider initiation of CCM services and plan of care.    Today's Visit:  Engaged with patient by telephone for follow up visit.        Goals Addressed             This Visit's Progress    CCM (HYPERTENSION) EXPECTED OUTCOME: MONITOR, SELF-MANAGE AND REDUCE SYMPTOMS OF HYPERTENSION       Current Barriers:  Knowledge Deficits related to Hypertension management Chronic Disease Management support and education needs related to Hypertension, diet Language Barrier No Advanced Directives in place- information has been previously mailed Son [name] reports patient does not follow a special diet, drinks adequate fluids, mostly water, patient's daughter checks blood pressure at least once per week.  Planned Interventions: Evaluation of current treatment plan related to hypertension self management and patient's adherence to plan as established by provider;   Reviewed prescribed diet low sodium Reviewed medications with patient and discussed importance of compliance;  Counseled on the importance of exercise goals with target of 150 minutes per week Discussed plans with patient for ongoing care management follow up and provided patient with direct contact information for care management team; Advised patient, providing education and rationale, to monitor blood pressure daily and record, calling PCP for findings outside established parameters;  Reviewed scheduled/upcoming provider appointments including:  Advised patient to discuss any issues with blood pressure with provider; Discussed complications of poorly controlled blood pressure such as heart disease,  stroke, circulatory complications, vision complications, kidney impairment, sexual dysfunction;  Reinforced low sodium diet Please check blood pressure several times per week Reviewed normal parameters for blood pressure  Symptom Management: Take medications as prescribed   Attend all scheduled provider appointments Call pharmacy for medication refills 3-7 days in advance of running out of medications Perform all self care activities independently  Call provider office for new concerns or questions  check blood pressure 3 times per week choose a place to take my blood pressure (home, clinic or office, retail store) write blood pressure results in a log or diary take blood pressure log to all doctor appointments keep all doctor appointments take medications for blood pressure exactly as prescribed begin an exercise program report new symptoms to your doctor eat more whole grains, fruits and vegetables, lean meats and healthy fats Continue doing your morning stretching routine Try to walk some daily if able Follow low sodium diet- read labels  Follow Up Plan: Telephone follow up appointment with care management team member scheduled for:  08/19/22 at 1230 pm       CCM (OSTEOPOROSIS) EXPECTED OUTCOME: MONITOR, SELF-MANAGE AND REDUCE SYMPTOMS OF OSTEOPOROSIS       Current Barriers:  Knowledge Deficits related to Osteoporosis management Chronic Disease Management support and education needs related to Osteoporosis Language Barrier No Advanced Directives in place- requested information be mailed Spoke with patient's son Wynonna Oestreicher (patient speaks Spanish and son translates), pt lives with spouse and other son, is overall independent with most aspects of her care, has walker she uses due to osteoporosis and arthritis, has never driven, works outside in her garden, is able to bathe, cook, dress independently, pharmacist will be working with  patient.  [name] reports pt has had one fall in  the past year, no injury, reports pt eats only one meal per day [name] reports pt fell last week, no injury, pt stated she felt "dizzy",  denies vertigo today.  [name] reports they may speak with primary care provider in April at appointment about obtaining a wheelchair to have if needed and when they leave home. Patient feels she is drinking adequate fluids, pt has walker on hand to use as needed. [name] reports pt did receive prolia injection and will be getting q 6 months.  Planned Interventions: Evaluation of current treatment plan related to Osteoporosis and patient's adherence to plan as established by provider Advised patient to take fosamax as prescribed Provided education to patient and/or caregiver about advanced directives Provided education to patient re: safety precautions and fall prevention Reviewed medications with patient and discussed importance of taking as prescribed Reviewed scheduled/upcoming provider appointments including primary care provider in April 2024 Reviewed importance of eating healthy nutritious meals with adequate calories, protein, vegetables and fruit and preferably 3 meals per day Reviewed importance of drinking adequate fluids to stay well hydrated Encouraged patient not to stand or sit up too quickly Encouraged checking blood pressure several times per week  Symptom Management: Take medications as prescribed   Attend all scheduled provider appointments Call pharmacy for medication refills 3-7 days in advance of running out of medications Perform all self care activities independently  Call provider office for new concerns or questions  Drink adequate fluids, preferably water Please rise up slowly from sitting or lying positions Use walker as needed Always ask for assistance as needed Do not walk if you are dizzy If you continue to have dizzy spells, please make appointment to see your primary care provider fall prevention strategies: change position  slowly, use assistive device such as walker or cane (per provider recommendations) when walking, keep walkways clear, have good lighting in room. It is important to contact your provider if you have any falls, maintain muscle strength/tone by exercise per provider recommendations.  Follow Up Plan:  Telephone follow up appointment with care management team member scheduled for:  08/19/22 at 1230 pm          Plan:Telephone follow up appointment with care management team member scheduled for:  08/19/22 at 14 pm  Jacqlyn Larsen [facility], BSN RN Case Manager [place] [phone]

## 2022-05-28 NOTE — Patient Instructions (Signed)
Please call the care guide team at [phone] if you need to cancel or reschedule your appointment.   If you are experiencing a Mental Health or [place] or need someone to talk to, please call the Suicide and Crisis Lifeline: 988 call the [place] National Suicide Prevention Lifeline: 629 511 2355 or TTY: 847 254 7854 TTY 867-488-9514) to talk to a trained counselor call 1-800-273-TALK (toll free, 24 hour hotline) go to [facility] [address] [place] [phone]) call the [facility]: [phone] call 911   Following is a copy of the CCM Program Consent:  CCM service includes personalized support from designated clinical staff supervised by the physician, including individualized plan of care and coordination with other care providers 24/7 contact phone numbers for assistance for urgent and routine care needs. Service will only be billed when office clinical staff spend 20 minutes or more in a month to coordinate care. Only one practitioner may furnish and bill the service in a calendar month. The patient may stop CCM services at amy time (effective at the end of the month) by phone call to the office staff. The patient will be responsible for cost sharing (co-pay) or up to 20% of the service fee (after annual deductible is met)  Following is a copy of your full provider care plan:   Goals Addressed             This Visit's Progress    CCM (HYPERTENSION) EXPECTED OUTCOME: MONITOR, SELF-MANAGE AND REDUCE SYMPTOMS OF HYPERTENSION       Current Barriers:  Knowledge Deficits related to Hypertension management Chronic Disease Management support and education needs related to Hypertension, diet Language Barrier No Advanced Directives in place- information has been previously mailed Son [name] reports patient does not follow a special diet, drinks adequate fluids, mostly water, patient's daughter checks blood  pressure at least once per week.  Planned Interventions: Evaluation of current treatment plan related to hypertension self management and patient's adherence to plan as established by provider;   Reviewed prescribed diet low sodium Reviewed medications with patient and discussed importance of compliance;  Counseled on the importance of exercise goals with target of 150 minutes per week Discussed plans with patient for ongoing care management follow up and provided patient with direct contact information for care management team; Advised patient, providing education and rationale, to monitor blood pressure daily and record, calling PCP for findings outside established parameters;  Reviewed scheduled/upcoming provider appointments including:  Advised patient to discuss any issues with blood pressure with provider; Discussed complications of poorly controlled blood pressure such as heart disease, stroke, circulatory complications, vision complications, kidney impairment, sexual dysfunction;  Reinforced low sodium diet Please check blood pressure several times per week Reviewed normal parameters for blood pressure  Symptom Management: Take medications as prescribed   Attend all scheduled provider appointments Call pharmacy for medication refills 3-7 days in advance of running out of medications Perform all self care activities independently  Call provider office for new concerns or questions  check blood pressure 3 times per week choose a place to take my blood pressure (home, clinic or office, retail store) write blood pressure results in a log or diary take blood pressure log to all doctor appointments keep all doctor appointments take medications for blood pressure exactly as prescribed begin an exercise program report new symptoms to your doctor eat more whole grains, fruits and vegetables, lean meats and healthy fats Continue doing your morning stretching routine Try  to walk some  daily if able Follow low sodium diet- read labels  Follow Up Plan: Telephone follow up appointment with care management team member scheduled for:  08/19/22 at 1230 pm       CCM (OSTEOPOROSIS) EXPECTED OUTCOME: MONITOR, SELF-MANAGE AND REDUCE SYMPTOMS OF OSTEOPOROSIS       Current Barriers:  Knowledge Deficits related to Osteoporosis management Chronic Disease Management support and education needs related to Osteoporosis Language Barrier No Advanced Directives in place- requested information be mailed Spoke with patient's son Lyric Rossano (patient speaks Spanish and son translates), pt lives with spouse and other son, is overall independent with most aspects of her care, has walker she uses due to osteoporosis and arthritis, has never driven, works outside in her garden, is able to bathe, cook, dress independently, pharmacist will be working with patient.  [name] reports pt has had one fall in the past year, no injury, reports pt eats only one meal per day [name] reports pt fell last week, no injury, pt stated she felt "dizzy",  denies vertigo today.  [name] reports they may speak with primary care provider in April at appointment about obtaining a wheelchair to have if needed and when they leave home. Patient feels she is drinking adequate fluids, pt has walker on hand to use as needed. [name] reports pt did receive prolia injection and will be getting q 6 months.  Planned Interventions: Evaluation of current treatment plan related to Osteoporosis and patient's adherence to plan as established by provider Advised patient to take fosamax as prescribed Provided education to patient and/or caregiver about advanced directives Provided education to patient re: safety precautions and fall prevention Reviewed medications with patient and discussed importance of taking as prescribed Reviewed scheduled/upcoming provider appointments including primary care provider in April 2024 Reviewed importance of  eating healthy nutritious meals with adequate calories, protein, vegetables and fruit and preferably 3 meals per day Reviewed importance of drinking adequate fluids to stay well hydrated Encouraged patient not to stand or sit up too quickly Encouraged checking blood pressure several times per week  Symptom Management: Take medications as prescribed   Attend all scheduled provider appointments Call pharmacy for medication refills 3-7 days in advance of running out of medications Perform all self care activities independently  Call provider office for new concerns or questions  Drink adequate fluids, preferably water Please rise up slowly from sitting or lying positions Use walker as needed Always ask for assistance as needed Do not walk if you are dizzy If you continue to have dizzy spells, please make appointment to see your primary care provider fall prevention strategies: change position slowly, use assistive device such as walker or cane (per provider recommendations) when walking, keep walkways clear, have good lighting in room. It is important to contact your provider if you have any falls, maintain muscle strength/tone by exercise per provider recommendations.  Follow Up Plan:  Telephone follow up appointment with care management team member scheduled for:  08/19/22 at 1230 pm          Patient verbalizes understanding of instructions and care plan provided today and agrees to view in [place]. Active MyChart status and patient understanding of how to access instructions and care plan via MyChart confirmed with patient.     Telephone follow up appointment with care management team member scheduled for:  08/19/22 at 1230 pm

## 2022-06-19 DIAGNOSIS — M810 Age-related osteoporosis without current pathological fracture: Secondary | ICD-10-CM

## 2022-06-19 DIAGNOSIS — I10 Essential (primary) hypertension: Secondary | ICD-10-CM

## 2022-08-14 ENCOUNTER — Encounter: Payer: Self-pay | Admitting: Family Medicine

## 2022-08-14 ENCOUNTER — Ambulatory Visit (INDEPENDENT_AMBULATORY_CARE_PROVIDER_SITE_OTHER): Payer: 59 | Admitting: Family Medicine

## 2022-08-14 VITALS — BP 122/70 | HR 61 | Ht <= 58 in | Wt 119.0 lb

## 2022-08-14 DIAGNOSIS — Z23 Encounter for immunization: Secondary | ICD-10-CM

## 2022-08-14 DIAGNOSIS — M810 Age-related osteoporosis without current pathological fracture: Secondary | ICD-10-CM | POA: Diagnosis not present

## 2022-08-14 DIAGNOSIS — E785 Hyperlipidemia, unspecified: Secondary | ICD-10-CM

## 2022-08-14 DIAGNOSIS — I10 Essential (primary) hypertension: Secondary | ICD-10-CM | POA: Diagnosis not present

## 2022-08-14 LAB — CBC WITH DIFFERENTIAL/PLATELET
Basophils Absolute: 0 10*3/uL (ref 0.0–0.2)
Basos: 1 %
EOS (ABSOLUTE): 0.3 10*3/uL (ref 0.0–0.4)
Eos: 5 %
Hematocrit: 38.1 % (ref 34.0–46.6)
Hemoglobin: 12.6 g/dL (ref 11.1–15.9)
Immature Grans (Abs): 0 10*3/uL (ref 0.0–0.1)
Immature Granulocytes: 0 %
Lymphocytes Absolute: 1.7 10*3/uL (ref 0.7–3.1)
Lymphs: 28 %
MCH: 29 pg (ref 26.6–33.0)
MCHC: 33.1 g/dL (ref 31.5–35.7)
MCV: 88 fL (ref 79–97)
Monocytes Absolute: 0.4 10*3/uL (ref 0.1–0.9)
Monocytes: 7 %
Neutrophils Absolute: 3.6 10*3/uL (ref 1.4–7.0)
Neutrophils: 59 %
Platelets: 305 10*3/uL (ref 150–450)
RBC: 4.35 x10E6/uL (ref 3.77–5.28)
RDW: 14.1 % (ref 11.7–15.4)
WBC: 6 10*3/uL (ref 3.4–10.8)

## 2022-08-14 LAB — LIPID PANEL
Chol/HDL Ratio: 4 ratio (ref 0.0–4.4)
Cholesterol, Total: 168 mg/dL (ref 100–199)
HDL: 42 mg/dL (ref >39)
LDL Chol Calc (NIH): 104 mg/dL — ABNORMAL HIGH (ref 0–99)
Triglycerides: 121 mg/dL (ref 0–149)
VLDL Cholesterol Cal: 22 mg/dL (ref 5–40)

## 2022-08-14 LAB — CMP14+EGFR
ALT: 15 IU/L (ref 0–32)
AST: 20 IU/L (ref 0–40)
Albumin/Globulin Ratio: 1.3 (ref 1.2–2.2)
Albumin: 3.8 g/dL (ref 3.7–4.7)
Alkaline Phosphatase: 93 IU/L (ref 44–121)
BUN/Creatinine Ratio: 19 (ref 12–28)
BUN: 13 mg/dL (ref 8–27)
Bilirubin Total: 0.4 mg/dL (ref 0.0–1.2)
CO2: 20 mmol/L (ref 20–29)
Calcium: 8.8 mg/dL (ref 8.7–10.3)
Chloride: 104 mmol/L (ref 96–106)
Creatinine, Ser: 0.68 mg/dL (ref 0.57–1.00)
Globulin, Total: 2.9 g/dL (ref 1.5–4.5)
Glucose: 86 mg/dL (ref 70–99)
Potassium: 4.1 mmol/L (ref 3.5–5.2)
Sodium: 138 mmol/L (ref 134–144)
Total Protein: 6.7 g/dL (ref 6.0–8.5)
eGFR: 84 mL/min/{1.73_m2} (ref >59)

## 2022-08-14 NOTE — Progress Notes (Signed)
BP 122/70   Pulse 61   Ht  (1.473 m)   Wt 119 lb (54 kg)   SpO2 97%   BMI 24.87 kg/m    Subjective:   Patient ID: [name], female    DOB: [birth year], 89 y.o.   MRN: [id number]  HPI: [name] is a 89 y.o. female presenting on 08/14/2022 for Medical Management of Chronic Issues, Hyperlipidemia, Hypertension, and Osteoporosis (First prolia injection in Jan 2024)   HPI Hypertension Patient is currently on amlodipine, and their blood pressure today is 122/70. Patient denies any lightheadedness or dizziness. Patient denies headaches, blurred vision, chest pains, shortness of breath, or weakness. Denies any side effects from medication and is content with current medication.   Hyperlipidemia Patient is coming in for recheck of his hyperlipidemia. The patient is currently taking atorvastatin. They deny any issues with myalgias or history of liver damage from it. They deny any focal numbness or weakness or chest pain.   Osteoporosis/osteopenia Fractures or history of fracture: No Medication: Prolia Duration of treatment: 5 months Last bone density scan: 02/13/2022 Last T score: -3.1  Relevant past medical, surgical, family and social history reviewed and updated as indicated. Interim medical history since our last visit reviewed. Allergies and medications reviewed and updated.  Review of Systems  Constitutional:  Negative for chills and fever.  Eyes:  Negative for redness and visual disturbance.  Respiratory:  Negative for chest tightness and shortness of breath.   Cardiovascular:  Negative for chest pain and leg swelling.  Genitourinary:  Negative for difficulty urinating and dysuria.  Musculoskeletal:  Negative for back pain and gait problem.  Skin:  Negative for rash.  Neurological:  Negative for dizziness, light-headedness and headaches.  Psychiatric/Behavioral:  Negative for agitation and behavioral problems.   All other systems reviewed and are  negative.   Per HPI unless specifically indicated above   Allergies as of 08/14/2022       Reactions   Penicillin G Hives   Lisinopril Swelling   Tongue swelling        Medication List        Accurate as of August 14, 2022 11:12 AM. If you have any questions, ask your nurse or doctor.          alendronate 70 MG tablet Commonly known as: FOSAMAX Take 1 tablet (70 mg total) by mouth once a week. Take with a full glass of water on an empty stomach.   amLODipine 5 MG tablet Commonly known as: NORVASC Take 1 tablet (5 mg total) by mouth daily.   atorvastatin 40 MG tablet Commonly known as: LIPITOR Take 1 tablet (40 mg total) by mouth at bedtime.   denosumab 60 MG/ML Sosy injection Commonly known as: PROLIA Inject 60 mg into the skin every 6 (six) months.   diclofenac 75 MG EC tablet Commonly known as: VOLTAREN Take 1 tablet (75 mg total) by mouth 2 (two) times daily.   loratadine 10 MG tablet Commonly known as: CLARITIN Take 1 tablet (10 mg total) by mouth daily.   PROSTAT PO Take by mouth. Liquid prostat         Objective:   BP 122/70   Pulse 61   Ht  (1.473 m)   Wt 119 lb (54 kg)   SpO2 97%   BMI 24.87 kg/m   Wt Readings from Last 3 Encounters:  08/14/22 119 lb (54 kg)  04/08/22 120 lb (54.4 kg)  02/12/22 120 lb (54.4 kg)  Physical Exam Vitals and nursing note reviewed.  Constitutional:      General: She is not in acute distress.    Appearance: She is well-developed. She is not diaphoretic.  Eyes:     Conjunctiva/sclera: Conjunctivae normal.  Cardiovascular:     Rate and Rhythm: Normal rate and regular rhythm.     Heart sounds: Normal heart sounds. No murmur heard. Pulmonary:     Effort: Pulmonary effort is normal. No respiratory distress.     Breath sounds: Normal breath sounds. No wheezing.  Musculoskeletal:        General: No swelling or tenderness. Normal range of motion.  Skin:    General: Skin is warm and dry.      Findings: No rash.  Neurological:     Mental Status: She is alert and oriented to person, place, and time.     Coordination: Coordination normal.  Psychiatric:        Behavior: Behavior normal.       Assessment & Plan:   Problem List Items Addressed This Visit       Cardiovascular and Mediastinum   HTN (hypertension) - Primary   Relevant Orders   CBC with Differential/Platelet   CMP14+EGFR   Lipid panel     Musculoskeletal and Integument   Osteoporosis     Other   Dyslipidemia   Relevant Orders   CBC with Differential/Platelet   CMP14+EGFR   Lipid panel   Other Visit Diagnoses     Need for pneumococcal 20-valent conjugate vaccination       Relevant Orders   Pneumococcal conjugate vaccine 20-valent (Prevnar 20) (Completed)       Continue current medicine, seems to be doing well.  Recommended calcium and vitamin D added to her daily regiment. Follow up plan: Return in about 6 months (around 02/13/2023), or if symptoms worsen or fail to improve, for Hypertension and hyperlipidemia and osteoporosis recheck.  Counseling provided for all of the vaccine components Orders Placed This Encounter  Procedures   Pneumococcal conjugate vaccine 20-valent (Prevnar 20)   CBC with Differential/Platelet   CMP14+EGFR   Lipid panel    Arville Care, MD [facility] 08/14/2022, 11:12 AM

## 2022-08-14 NOTE — Patient Instructions (Signed)
Suggest 800 international units of vitamin D daily and 1200 mg elemental calcium for osteopenia or osteoporosis.   

## 2022-08-19 ENCOUNTER — Ambulatory Visit (INDEPENDENT_AMBULATORY_CARE_PROVIDER_SITE_OTHER): Payer: 59 | Admitting: *Deleted

## 2022-08-19 DIAGNOSIS — M810 Age-related osteoporosis without current pathological fracture: Secondary | ICD-10-CM

## 2022-08-19 DIAGNOSIS — I10 Essential (primary) hypertension: Secondary | ICD-10-CM

## 2022-08-19 NOTE — Chronic Care Management (AMB) (Signed)
Chronic Care Management   CCM RN Visit Note  08/19/2022 Name: [name] MRN: [id number] DOB: August 07, 1932  Subjective: [name] is a 89 y.o. year old female who is a primary care patient of Dettinger, Elige Radon, MD. The patient was referred to the Chronic Care Management team for assistance with care management needs subsequent to provider initiation of CCM services and plan of care.    Today's Visit:  Engaged with patient by telephone for follow up visit.        Goals Addressed             This Visit's Progress    CCM (HYPERTENSION) EXPECTED OUTCOME: MONITOR, SELF-MANAGE AND REDUCE SYMPTOMS OF HYPERTENSION       Current Barriers:  Knowledge Deficits related to Hypertension management Chronic Disease Management support and education needs related to Hypertension, diet Language Barrier No Advanced Directives in place- information has been previously mailed Son [name] reports patient does not follow a special diet, drinks adequate fluids, mostly water, patient's daughter checks blood pressure at least once per week. Spoke with [name] who reports pt recently had follow up visit with primary care provider and "everything checked out ok", no new issues or concerns reported  Planned Interventions: Evaluation of current treatment plan related to hypertension self management and patient's adherence to plan as established by provider;   Reviewed medications with patient and discussed importance of compliance;  Counseled on the importance of exercise goals with target of 150 minutes per week Discussed plans with patient for ongoing care management follow up and provided patient with direct contact information for care management team; Advised patient, providing education and rationale, to monitor blood pressure daily and record, calling PCP for findings outside established parameters;  Reviewed scheduled/upcoming provider appointments including:  Advised patient to discuss any issues  with blood pressure with provider; Reviewed importance of adherence to low sodium diet Reviewed importance of checking blood pressure several times per week and keep a log Reinforced normal parameters for blood pressure  Symptom Management: Take medications as prescribed   Attend all scheduled provider appointments Call pharmacy for medication refills 3-7 days in advance of running out of medications Perform all self care activities independently  Call provider office for new concerns or questions  check blood pressure 3 times per week choose a place to take my blood pressure (home, clinic or office, retail store) write blood pressure results in a log or diary keep a blood pressure log take blood pressure log to all doctor appointments keep all doctor appointments take medications for blood pressure exactly as prescribed begin an exercise program report new symptoms to your doctor eat more whole grains, fruits and vegetables, lean meats and healthy fats Continue doing your morning stretching routine Try to walk some daily if able Follow low sodium diet- read food labels Limit fast food  Follow Up Plan: Telephone follow up appointment with care management team member scheduled for:  10/29/22 at 945 am       CCM (OSTEOPOROSIS) EXPECTED OUTCOME: MONITOR, SELF-MANAGE AND REDUCE SYMPTOMS OF OSTEOPOROSIS       Current Barriers:  Knowledge Deficits related to Osteoporosis management Chronic Disease Management support and education needs related to Osteoporosis Language Barrier No Advanced Directives in place- requested information be mailed Spoke with patient's son [name] (patient speaks Spanish and son translates), pt lives with spouse and other son, is overall independent with most aspects of her care, has walker she uses due to osteoporosis and arthritis, has  never driven, works outside in her garden, is able to bathe, cook, dress independently, pharmacist has been working with  patient.  [name] reports pt has had one fall in the past year, no injury, reports pt eats only one meal per day [name] reports no falls since last conversation. [name] reports pt did receive prolia injection and will continue q 6 months.  Planned Interventions: Evaluation of current treatment plan related to Osteoporosis and patient's adherence to plan as established by provider Advised patient to take fosamax as prescribed Provided education to patient and/or caregiver about advanced directives Provided education to patient re: safety precautions and fall prevention Reviewed medications with patient and discussed importance of taking as prescribed Reviewed scheduled/upcoming provider appointments including primary care provider in April 2024 Reinforced importance of eating healthy nutritious meals with adequate calories, protein, vegetables and fruit and preferably 3 meals per day Reviewed importance of drinking adequate fluids to stay well hydrated Encouraged patient not to stand or sit up too quickly Instructed to report any vertigo to primary care provider Encouraged checking blood pressure several times per week  Symptom Management: Take medications as prescribed   Attend all scheduled provider appointments Call pharmacy for medication refills 3-7 days in advance of running out of medications Perform all self care activities independently  Call provider office for new concerns or questions  Drink adequate fluids, preferably water Please rise up slowly from sitting or lying positions Use walker as needed Always ask for assistance as needed Do not walk if you are dizzy If you have dizzy spells, please report to your primary care provider fall prevention strategies: change position slowly, use assistive device such as walker or cane (per provider recommendations) when walking, keep walkways clear, have good lighting in room. It is important to contact your provider if you have any falls,  maintain muscle strength/tone by exercise per provider recommendations.  Follow Up Plan:  Telephone follow up appointment with care management team member scheduled for:   10/29/22 at 945 am          Plan:Telephone follow up appointment with care management team member scheduled for:  10/29/22 at 945 am  Irving Shows [facility], BSN RN Case Manager [facility] [phone]

## 2022-08-19 NOTE — Patient Instructions (Signed)
Please call the care guide team at [phone] if you need to cancel or reschedule your appointment.   If you are experiencing a Mental Health or Behavioral Health Crisis or need someone to talk to, please call the Suicide and Crisis Lifeline: 988 call the [place] National Suicide Prevention Lifeline: 337-720-6901 or TTY: 3608668837 TTY (873)533-0295) to talk to a trained counselor call 1-800-273-TALK (toll free, 24 hour hotline) go to [facility] [address] [place] [phone]) call the [facility]: [phone] call 911   Following is a copy of the CCM Program Consent:  CCM service includes personalized support from designated clinical staff supervised by the physician, including individualized plan of care and coordination with other care providers 24/7 contact phone numbers for assistance for urgent and routine care needs. Service will only be billed when office clinical staff spend 20 minutes or more in a month to coordinate care. Only one practitioner may furnish and bill the service in a calendar month. The patient may stop CCM services at amy time (effective at the end of the month) by phone call to the office staff. The patient will be responsible for cost sharing (co-pay) or up to 20% of the service fee (after annual deductible is met)  Following is a copy of your full provider care plan:   Goals Addressed             This Visit's Progress    CCM (HYPERTENSION) EXPECTED OUTCOME: MONITOR, SELF-MANAGE AND REDUCE SYMPTOMS OF HYPERTENSION       Current Barriers:  Knowledge Deficits related to Hypertension management Chronic Disease Management support and education needs related to Hypertension, diet Language Barrier No Advanced Directives in place- information has been previously mailed Son Ignacia Bayley reports patient does not follow a special diet, drinks adequate fluids, mostly water, patient's daughter checks blood  pressure at least once per week. Spoke with Ignacia Bayley who reports pt recently had follow up visit with primary care provider and "everything checked out ok", no new issues or concerns reported  Planned Interventions: Evaluation of current treatment plan related to hypertension self management and patient's adherence to plan as established by provider;   Reviewed medications with patient and discussed importance of compliance;  Counseled on the importance of exercise goals with target of 150 minutes per week Discussed plans with patient for ongoing care management follow up and provided patient with direct contact information for care management team; Advised patient, providing education and rationale, to monitor blood pressure daily and record, calling PCP for findings outside established parameters;  Reviewed scheduled/upcoming provider appointments including:  Advised patient to discuss any issues with blood pressure with provider; Reviewed importance of adherence to low sodium diet Reviewed importance of checking blood pressure several times per week and keep a log Reinforced normal parameters for blood pressure  Symptom Management: Take medications as prescribed   Attend all scheduled provider appointments Call pharmacy for medication refills 3-7 days in advance of running out of medications Perform all self care activities independently  Call provider office for new concerns or questions  check blood pressure 3 times per week choose a place to take my blood pressure (home, clinic or office, retail store) write blood pressure results in a log or diary keep a blood pressure log take blood pressure log to all doctor appointments keep all doctor appointments take medications for blood pressure exactly as prescribed begin an exercise program report new symptoms to your doctor eat more whole grains,  fruits and vegetables, lean meats and healthy fats Continue doing your morning stretching  routine Try to walk some daily if able Follow low sodium diet- read food labels Limit fast food  Follow Up Plan: Telephone follow up appointment with care management team member scheduled for:  10/29/22 at 945 am       CCM (OSTEOPOROSIS) EXPECTED OUTCOME: MONITOR, SELF-MANAGE AND REDUCE SYMPTOMS OF OSTEOPOROSIS       Current Barriers:  Knowledge Deficits related to Osteoporosis management Chronic Disease Management support and education needs related to Osteoporosis Language Barrier No Advanced Directives in place- requested information be mailed Spoke with patient's son Donia Yokum (patient speaks Spanish and son translates), pt lives with spouse and other son, is overall independent with most aspects of her care, has walker she uses due to osteoporosis and arthritis, has never driven, works outside in her garden, is able to bathe, cook, dress independently, pharmacist has been working with patient.  Ignacia Bayley reports pt has had one fall in the past year, no injury, reports pt eats only one meal per day Ignacia Bayley reports no falls since last conversation. Ignacia Bayley reports pt did receive prolia injection and will continue q 6 months.  Planned Interventions: Evaluation of current treatment plan related to Osteoporosis and patient's adherence to plan as established by provider Advised patient to take fosamax as prescribed Provided education to patient and/or caregiver about advanced directives Provided education to patient re: safety precautions and fall prevention Reviewed medications with patient and discussed importance of taking as prescribed Reviewed scheduled/upcoming provider appointments including primary care provider in April 2024 Reinforced importance of eating healthy nutritious meals with adequate calories, protein, vegetables and fruit and preferably 3 meals per day Reviewed importance of drinking adequate fluids to stay well hydrated Encouraged patient not to stand or sit up too  quickly Instructed to report any vertigo to primary care provider Encouraged checking blood pressure several times per week  Symptom Management: Take medications as prescribed   Attend all scheduled provider appointments Call pharmacy for medication refills 3-7 days in advance of running out of medications Perform all self care activities independently  Call provider office for new concerns or questions  Drink adequate fluids, preferably water Please rise up slowly from sitting or lying positions Use walker as needed Always ask for assistance as needed Do not walk if you are dizzy If you have dizzy spells, please report to your primary care provider fall prevention strategies: change position slowly, use assistive device such as walker or cane (per provider recommendations) when walking, keep walkways clear, have good lighting in room. It is important to contact your provider if you have any falls, maintain muscle strength/tone by exercise per provider recommendations.  Follow Up Plan:  Telephone follow up appointment with care management team member scheduled for:   10/29/22 at 945 am          Patient verbalizes understanding of instructions and care plan provided today and agrees to view in MyChart. Active MyChart status and patient understanding of how to access instructions and care plan via MyChart confirmed with patient.  Telephone follow up appointment with care management team member scheduled for:  10/29/22 at 945 am

## 2022-08-20 ENCOUNTER — Other Ambulatory Visit: Payer: Self-pay

## 2022-08-20 MED ORDER — ATORVASTATIN CALCIUM 80 MG PO TABS: 80.0000 mg | ORAL_TABLET | Freq: Every day | ORAL | 3 refills | Status: DC

## 2022-10-20 ENCOUNTER — Telehealth: Payer: Self-pay

## 2022-10-20 NOTE — Progress Notes (Signed)
  Chronic Care Management Note  10/20/2022 Name: [name] MRN: [id number] DOB: 26-Dec-1932  [name] is a 89 y.o. year old female who is a primary care patient of Dettinger, Elige Radon, MD and is actively engaged with the Chronic Care Management team. I reached out to Sheryn Bison by phone today to assist with re-scheduling a follow up visit with the RN Case Manager  Follow up plan: Unsuccessful telephone outreach attempt made. A HIPAA compliant phone message was left for the patient providing contact information and requesting a return call.  The care management team will reach out to the patient again over the next 7 days.  If patient returns call to provider office, please advise to call CCM Care Guide Talea Manges  at [phone]  Penne Lash, RMA Care Guide [facility]  [place], [place] [address] Direct Dial: [phone] [email or web address]

## 2022-10-29 ENCOUNTER — Telehealth: Payer: 59

## 2022-11-04 ENCOUNTER — Telehealth: Payer: Self-pay

## 2022-11-04 NOTE — Telephone Encounter (Signed)
Prolia VOB initiated via [email or web address]  Last Prolia inj: 05/12/22 Next Prolia inj DUE:  11/10/22

## 2022-11-07 ENCOUNTER — Other Ambulatory Visit (HOSPITAL_COMMUNITY): Payer: Self-pay

## 2022-11-07 NOTE — Telephone Encounter (Signed)
Pt ready for scheduling for PROLIA on or after : 11/10/22  Out-of-pocket cost due at time of visit: $327  Primary: [facility] MEDICARE Prolia co-insurance: 20% Admin fee co-insurance: 20%  Secondary: [place] MEDICAID Prolia co-insurance: There is no coverage for Prolia through Agency Medicaid due to an exclusion Admin fee co-insurance:   Medical Benefit Details: Date Benefits were checked: 11/06/22 Deductible: NO/ Coinsurance: 20%/ Admin Fee: 20%  Prior Auth: APPROVED PA# [id number] Expiration Date: 11/07/22-11/07/23   Pharmacy benefit: Copay $--- (REFILL TOO SOON) If patient wants fill through the pharmacy benefit please send prescription to:  --- , and include estimated need by date in rx notes. Pharmacy will ship medication directly to the office.  Patient NOT eligible for Prolia Copay Card. Copay Card can make patient's cost as little as $25. Link to apply: [email or web address]://www.amgensupportplus.[email or web address]/copay  ** This summary of benefits is an estimation of the patient's out-of-pocket cost. Exact cost may very based on individual plan coverage.

## 2022-11-12 ENCOUNTER — Ambulatory Visit (INDEPENDENT_AMBULATORY_CARE_PROVIDER_SITE_OTHER): Payer: 59

## 2022-11-12 DIAGNOSIS — M810 Age-related osteoporosis without current pathological fracture: Secondary | ICD-10-CM | POA: Diagnosis not present

## 2022-11-12 MED ORDER — DENOSUMAB 60 MG/ML ~~LOC~~ SOSY
60.0000 mg | PREFILLED_SYRINGE | Freq: Once | SUBCUTANEOUS | Status: AC
  Administered 2022-11-12: 60 mg via SUBCUTANEOUS

## 2022-11-12 NOTE — Progress Notes (Signed)
Prolia injection given to right upper arm.  Patient tolerated well. 

## 2022-11-12 NOTE — Telephone Encounter (Signed)
Given 11/12/22

## 2022-11-18 NOTE — Progress Notes (Signed)
  Care Coordination Note  11/18/2022 Name: [name] MRN: [id number] DOB: 05-Jul-1932  [name] is a 89 y.o. year old female who is a primary care patient of Dettinger, Elige Radon, MD and is actively engaged with the care management team. I reached out to [name] by phone today to assist with re-scheduling a follow up visit with the RN Case Manager  Follow up plan: Telephone appointment with care management team member scheduled for:12/10/2022  [name], RMA Care Guide [facility]  [place], [place] [address] Direct Dial: [phone] [name]

## 2022-12-10 ENCOUNTER — Encounter: Payer: Self-pay | Admitting: *Deleted

## 2022-12-10 ENCOUNTER — Other Ambulatory Visit: Payer: 59 | Admitting: *Deleted

## 2022-12-10 NOTE — Patient Outreach (Signed)
Care Management   Visit Note  12/10/2022 Name: [name] MRN: [id number] DOB: 1933-04-03  Subjective: [name] is a 89 y.o. year old female who is a primary care patient of Dettinger, Elige Radon, MD. The Care Management team was consulted for assistance.      Engaged with patient spoke with patient by telephone for follow up   Goals Addressed             This Visit's Progress    COMPLETED: CCM (HYPERTENSION) EXPECTED OUTCOME: MONITOR, SELF-MANAGE AND REDUCE SYMPTOMS OF HYPERTENSION       Current Barriers:  Knowledge Deficits related to Hypertension management Chronic Disease Management support and education needs related to Hypertension, diet Language Barrier No Advanced Directives in place- information has been previously mailed Son Ignacia Bayley reports patient does not follow a special diet, drinks adequate fluids, mostly water, patient's daughter checks blood pressure at least once per week. Spoke with Ignacia Bayley who reports no new issues or concerns, states pt is doing well, has all medications and taking as prescribed  Planned Interventions: Evaluation of current treatment plan related to hypertension self management and patient's adherence to plan as established by provider;   Reviewed medications with patient and discussed importance of compliance;  Counseled on the importance of exercise goals with target of 150 minutes per week Discussed plans with patient for ongoing care management follow up and provided patient with direct contact information for care management team; Advised patient, providing education and rationale, to monitor blood pressure daily and record, calling PCP for findings outside established parameters;  Reviewed scheduled/upcoming provider appointments including:  Advised patient to discuss any issues with blood pressure with provider; Reinforced importance of adherence to low sodium diet Reinforced importance of checking blood pressure several times per  week and keep a log Reinforced normal parameters for blood pressure Reviewed plan of care with patient and son including case closure  Symptom Management: Take medications as prescribed   Attend all scheduled provider appointments Call pharmacy for medication refills 3-7 days in advance of running out of medications Perform all self care activities independently  Call provider office for new concerns or questions  check blood pressure 3 times per week choose a place to take my blood pressure (home, clinic or office, retail store) write blood pressure results in a log or diary keep a blood pressure log take blood pressure log to all doctor appointments keep all doctor appointments take medications for blood pressure exactly as prescribed begin an exercise program report new symptoms to your doctor eat more whole grains, fruits and vegetables, lean meats and healthy fats Continue doing your morning stretching routine Try to walk some daily if able Follow low sodium diet- read food labels Limit fast food  Follow Up Plan: No further follow up required: Case closed         COMPLETED: CCM (OSTEOPOROSIS) EXPECTED OUTCOME: MONITOR, SELF-MANAGE AND REDUCE SYMPTOMS OF OSTEOPOROSIS       Current Barriers:  Knowledge Deficits related to Osteoporosis management Chronic Disease Management support and education needs related to Osteoporosis Language Barrier No Advanced Directives in place- requested information be mailed Spoke with patient's son Kizmet Endlich (patient speaks Spanish and son translates), pt lives with spouse and other son, is overall independent with most aspects of her care, has walker she uses due to osteoporosis and arthritis, has never driven, works outside in her garden, is able to bathe, cook, dress independently.  Ignacia Bayley reports pt has had one fall in the  past year, no injury, reports pt eats only one meal per day Ignacia Bayley reports no falls since last conversation. Ignacia Bayley  reports pt receives prolia injections and will continue q 6 months.  Planned Interventions: Evaluation of current treatment plan related to Osteoporosis and patient's adherence to plan as established by provider Advised patient to take fosamax as prescribed Provided education to patient and/or caregiver about advanced directives Provided education to patient re: safety precautions and fall prevention Reviewed medications with patient and discussed importance of taking as prescribed Reviewed scheduled/upcoming provider appointments including primary care provider in April 2024 Reviewed importance of eating healthy nutritious meals with adequate calories, protein, vegetables and fruit and preferably 3 meals per day Reinforced importance of drinking adequate fluids to stay well hydrated Encouraged patient not to stand or sit up too quickly Instructed to report any vertigo to primary care provider Encouraged checking blood pressure several times per week  Symptom Management: Take medications as prescribed   Attend all scheduled provider appointments Call pharmacy for medication refills 3-7 days in advance of running out of medications Perform all self care activities independently  Call provider office for new concerns or questions  Drink adequate fluids, preferably water Please rise up slowly from sitting or lying positions Use walker as needed Always ask for assistance as needed Do not walk if you are dizzy If you have dizzy spells, please report to your primary care provider fall prevention strategies: change position slowly, use assistive device such as walker or cane (per provider recommendations) when walking, keep walkways clear, have good lighting in room. It is important to contact your provider if you have any falls, maintain muscle strength/tone by exercise per provider recommendations.  Follow Up Plan:  No further follow up required: case closure              Plan: No  further follow up required: case closure  Irving Shows [facility], BSN [place]/ Ambulatory Care Management [phone]

## 2022-12-10 NOTE — Patient Instructions (Signed)
Visit Information  Thank you for taking time to visit with me today. Please don't hesitate to contact me if I can be of assistance to you before our next scheduled telephone appointment.  Following are the goals we discussed today:   Goals Addressed             This Visit's Progress    COMPLETED: CCM (HYPERTENSION) EXPECTED OUTCOME: MONITOR, SELF-MANAGE AND REDUCE SYMPTOMS OF HYPERTENSION       Current Barriers:  Knowledge Deficits related to Hypertension management Chronic Disease Management support and education needs related to Hypertension, diet Language Barrier No Advanced Directives in place- information has been previously mailed Son Ignacia Bayley reports patient does not follow a special diet, drinks adequate fluids, mostly water, patient's daughter checks blood pressure at least once per week. Spoke with Ignacia Bayley who reports no new issues or concerns, states pt is doing well, has all medications and taking as prescribed  Planned Interventions: Evaluation of current treatment plan related to hypertension self management and patient's adherence to plan as established by provider;   Reviewed medications with patient and discussed importance of compliance;  Counseled on the importance of exercise goals with target of 150 minutes per week Discussed plans with patient for ongoing care management follow up and provided patient with direct contact information for care management team; Advised patient, providing education and rationale, to monitor blood pressure daily and record, calling PCP for findings outside established parameters;  Reviewed scheduled/upcoming provider appointments including:  Advised patient to discuss any issues with blood pressure with provider; Reinforced importance of adherence to low sodium diet Reinforced importance of checking blood pressure several times per week and keep a log Reinforced normal parameters for blood pressure Reviewed plan of care with patient and  son including case closure  Symptom Management: Take medications as prescribed   Attend all scheduled provider appointments Call pharmacy for medication refills 3-7 days in advance of running out of medications Perform all self care activities independently  Call provider office for new concerns or questions  check blood pressure 3 times per week choose a place to take my blood pressure (home, clinic or office, retail store) write blood pressure results in a log or diary keep a blood pressure log take blood pressure log to all doctor appointments keep all doctor appointments take medications for blood pressure exactly as prescribed begin an exercise program report new symptoms to your doctor eat more whole grains, fruits and vegetables, lean meats and healthy fats Continue doing your morning stretching routine Try to walk some daily if able Follow low sodium diet- read food labels Limit fast food  Follow Up Plan: No further follow up required: Case closed         COMPLETED: CCM (OSTEOPOROSIS) EXPECTED OUTCOME: MONITOR, SELF-MANAGE AND REDUCE SYMPTOMS OF OSTEOPOROSIS       Current Barriers:  Knowledge Deficits related to Osteoporosis management Chronic Disease Management support and education needs related to Osteoporosis Language Barrier No Advanced Directives in place- requested information be mailed Spoke with patient's son Dimond Rascon (patient speaks Spanish and son translates), pt lives with spouse and other son, is overall independent with most aspects of her care, has walker she uses due to osteoporosis and arthritis, has never driven, works outside in her garden, is able to bathe, cook, dress independently.  Ignacia Bayley reports pt has had one fall in the past year, no injury, reports pt eats only one meal per day Ignacia Bayley reports no falls since last conversation. Ignacia Bayley  reports pt receives prolia injections and will continue q 6 months.  Planned Interventions: Evaluation of  current treatment plan related to Osteoporosis and patient's adherence to plan as established by provider Advised patient to take fosamax as prescribed Provided education to patient and/or caregiver about advanced directives Provided education to patient re: safety precautions and fall prevention Reviewed medications with patient and discussed importance of taking as prescribed Reviewed scheduled/upcoming provider appointments including primary care provider in April 2024 Reviewed importance of eating healthy nutritious meals with adequate calories, protein, vegetables and fruit and preferably 3 meals per day Reinforced importance of drinking adequate fluids to stay well hydrated Encouraged patient not to stand or sit up too quickly Instructed to report any vertigo to primary care provider Encouraged checking blood pressure several times per week  Symptom Management: Take medications as prescribed   Attend all scheduled provider appointments Call pharmacy for medication refills 3-7 days in advance of running out of medications Perform all self care activities independently  Call provider office for new concerns or questions  Drink adequate fluids, preferably water Please rise up slowly from sitting or lying positions Use walker as needed Always ask for assistance as needed Do not walk if you are dizzy If you have dizzy spells, please report to your primary care provider fall prevention strategies: change position slowly, use assistive device such as walker or cane (per provider recommendations) when walking, keep walkways clear, have good lighting in room. It is important to contact your provider if you have any falls, maintain muscle strength/tone by exercise per provider recommendations.  Follow Up Plan:  No further follow up required: case closure              Please call the care guide team at [phone] if you need to cancel or reschedule your appointment.   If you are  experiencing a Mental Health or Behavioral Health Crisis or need someone to talk to, please call the Suicide and Crisis Lifeline: 988 call the [place] National Suicide Prevention Lifeline: (570) 202-0203 or TTY: (940)055-0917 TTY 570-617-0320) to talk to a trained counselor call 1-800-273-TALK (toll free, 24 hour hotline) go to [facility] [address], [place] [phone]) call the [facility] Crisis Line: [phone] call 911   Patient verbalizes understanding of instructions and care plan provided today and agrees to view in MyChart. Active MyChart status and patient understanding of how to access instructions and care plan via MyChart confirmed with patient.     No further follow up required: case closure  Irving Shows [facility], BSN [place]/ Ambulatory Care Management [phone]

## 2022-12-13 ENCOUNTER — Other Ambulatory Visit: Payer: Self-pay | Admitting: Family Medicine

## 2022-12-13 DIAGNOSIS — M1712 Unilateral primary osteoarthritis, left knee: Secondary | ICD-10-CM

## 2022-12-15 NOTE — Telephone Encounter (Signed)
Dettinger NTBS in Oct for 6 mos FU RF sent to pharmacy

## 2022-12-15 NOTE — Telephone Encounter (Signed)
Appt scheduled for 01/23/2023

## 2023-01-23 ENCOUNTER — Encounter: Payer: Self-pay | Admitting: Family Medicine

## 2023-01-23 ENCOUNTER — Ambulatory Visit (INDEPENDENT_AMBULATORY_CARE_PROVIDER_SITE_OTHER): Payer: 59 | Admitting: Family Medicine

## 2023-01-23 VITALS — BP 127/69 | HR 71 | Ht <= 58 in | Wt 118.0 lb

## 2023-01-23 DIAGNOSIS — M810 Age-related osteoporosis without current pathological fracture: Secondary | ICD-10-CM | POA: Diagnosis not present

## 2023-01-23 DIAGNOSIS — N3281 Overactive bladder: Secondary | ICD-10-CM

## 2023-01-23 DIAGNOSIS — Z23 Encounter for immunization: Secondary | ICD-10-CM

## 2023-01-23 DIAGNOSIS — E785 Hyperlipidemia, unspecified: Secondary | ICD-10-CM

## 2023-01-23 DIAGNOSIS — J302 Other seasonal allergic rhinitis: Secondary | ICD-10-CM | POA: Diagnosis not present

## 2023-01-23 DIAGNOSIS — I10 Essential (primary) hypertension: Secondary | ICD-10-CM

## 2023-01-23 MED ORDER — LORATADINE 10 MG PO TABS: 10.0000 mg | ORAL_TABLET | Freq: Every day | ORAL | 3 refills | Status: AC

## 2023-01-23 MED ORDER — MIRABEGRON ER 25 MG PO TB24
25.0000 mg | ORAL_TABLET | Freq: Every day | ORAL | 3 refills | Status: DC
Start: 2023-01-23 — End: 2023-01-26

## 2023-01-23 MED ORDER — AMLODIPINE BESYLATE 5 MG PO TABS
5.0000 mg | ORAL_TABLET | Freq: Every day | ORAL | 3 refills | Status: DC
Start: 2023-01-23 — End: 2024-01-26

## 2023-01-23 NOTE — Progress Notes (Signed)
BP 127/69   Pulse 71   Ht 4\' 10"  (1.473 m)   Wt 118 lb (53.5 kg)   SpO2 95%   BMI 24.66 kg/m    Subjective:   Patient ID: [name], female    DOB: 03-09-1933, 89 y.o.   MRN: [id number]  HPI: [name] is a 89 y.o. female presenting on 01/23/2023 for Medical Management of Chronic Issues and Hypertension   HPI Hypertension Patient is currently on amlodipine and, and their blood pressure today is 127/69. Patient denies any lightheadedness or dizziness. Patient denies headaches, blurred vision, chest pains, shortness of breath, or weakness. Denies any side effects from medication and is content with current medication.   Hyperlipidemia Patient is coming in for recheck of his hyperlipidemia. The patient is currently taking atorvastatin. They deny any issues with myalgias or history of liver damage from it. They deny any focal numbness or weakness or chest pain.   Osteoporosis/osteopenia Fractures or history of fracture: None Medication: Prolia Duration of treatment: 6 months Last bone density scan: 02/12/2022 Last T score: -3.9  Relevant past medical, surgical, family and social history reviewed and updated as indicated. Interim medical history since our last visit reviewed. Allergies and medications reviewed and updated.  Review of Systems  Constitutional:  Negative for chills and fever.  Eyes:  Negative for redness and visual disturbance.  Respiratory:  Negative for chest tightness and shortness of breath.   Cardiovascular:  Negative for chest pain and leg swelling.  Genitourinary:  Positive for frequency. Negative for difficulty urinating and dysuria.  Musculoskeletal:  Negative for back pain and gait problem.  Skin:  Negative for rash.  Neurological:  Negative for dizziness, light-headedness and headaches.  Psychiatric/Behavioral:  Negative for agitation and behavioral problems.   All other systems reviewed and are negative.   Per HPI unless specifically  indicated above   Allergies as of 01/23/2023       Reactions   Penicillin G Hives   Lisinopril Swelling   Tongue swelling        Medication List        Accurate as of January 23, 2023 11:25 AM. If you have any questions, ask your nurse or doctor.          STOP taking these medications    alendronate 70 MG tablet Commonly known as: FOSAMAX Stopped by: Elige Radon Otie Headlee       TAKE these medications    amLODipine 5 MG tablet Commonly known as: NORVASC Take 1 tablet (5 mg total) by mouth daily.   atorvastatin 80 MG tablet Commonly known as: LIPITOR Take 1 tablet (80 mg total) by mouth daily.   denosumab 60 MG/ML Sosy injection Commonly known as: PROLIA Inject 60 mg into the skin every 6 (six) months.   diclofenac 75 MG EC tablet Commonly known as: VOLTAREN TAKE 1 TABLET BY MOUTH TWICE A DAY   loratadine 10 MG tablet Commonly known as: CLARITIN Take 1 tablet (10 mg total) by mouth daily.   mirabegron ER 25 MG Tb24 tablet Commonly known as: MYRBETRIQ Take 1 tablet (25 mg total) by mouth daily. Started by: Elige Radon Denny Lave   PROSTAT PO Take by mouth. Liquid prostat         Objective:   BP 127/69   Pulse 71   Ht 4\' 10"  (1.473 m)   Wt 118 lb (53.5 kg)   SpO2 95%   BMI 24.66 kg/m   Wt Readings from Last 3 Encounters:  01/23/23 118 lb (53.5 kg)  08/14/22 119 lb (54 kg)  04/08/22 120 lb (54.4 kg)    Physical Exam Vitals and nursing note reviewed.  Constitutional:      General: She is not in acute distress.    Appearance: She is well-developed. She is not diaphoretic.  Eyes:     Conjunctiva/sclera: Conjunctivae normal.  Cardiovascular:     Rate and Rhythm: Normal rate and regular rhythm.     Heart sounds: Normal heart sounds. No murmur heard. Pulmonary:     Effort: Pulmonary effort is normal. No respiratory distress.     Breath sounds: Normal breath sounds. No wheezing.  Musculoskeletal:        General: No swelling or tenderness.  Normal range of motion.  Skin:    General: Skin is warm and dry.     Findings: No rash.  Neurological:     Mental Status: She is alert and oriented to person, place, and time.     Coordination: Coordination normal.  Psychiatric:        Behavior: Behavior normal.       Assessment & Plan:   Problem List Items Addressed This Visit       Cardiovascular and Mediastinum   HTN (hypertension)   Relevant Medications   amLODipine (NORVASC) 5 MG tablet   Other Relevant Orders   CBC with Differential/Platelet   CMP14+EGFR     Musculoskeletal and Integument   Osteoporosis   Relevant Orders   VITAMIN D 25 Hydroxy (Vit-D Deficiency, Fractures)     Other   Dyslipidemia - Primary   Relevant Orders   CBC with Differential/Platelet   CMP14+EGFR   Lipid panel   Other Visit Diagnoses     Seasonal allergies       Relevant Medications   loratadine (CLARITIN) 10 MG tablet   OAB (overactive bladder)       Relevant Medications   mirabegron ER (MYRBETRIQ) 25 MG TB24 tablet       Will try to do Myrbetriq for overactive bladder that she is having some leakage.  I will not continue current medicine and will do work. Follow up plan: Return in about 6 months (around 07/24/2023), or if symptoms worsen or fail to improve, for Hypertension and hld.  Counseling provided for all of the vaccine components Orders Placed This Encounter  Procedures   CBC with Differential/Platelet   CMP14+EGFR   Lipid panel   VITAMIN D 25 Hydroxy (Vit-D Deficiency, Fractures)    Arville Care, MD [facility] 01/23/2023, 11:25 AM

## 2023-01-24 LAB — LIPID PANEL
Chol/HDL Ratio: 3.8 {ratio} (ref 0.0–4.4)
Cholesterol, Total: 151 mg/dL (ref 100–199)
HDL: 40 mg/dL (ref >39)
LDL Chol Calc (NIH): 91 mg/dL (ref 0–99)
Triglycerides: 107 mg/dL (ref 0–149)
VLDL Cholesterol Cal: 20 mg/dL (ref 5–40)

## 2023-01-24 LAB — VITAMIN D 25 HYDROXY (VIT D DEFICIENCY, FRACTURES): Vit D, 25-Hydroxy: 35.9 ng/mL (ref 30.0–100.0)

## 2023-01-24 LAB — CMP14+EGFR
ALT: 11 [IU]/L (ref 0–32)
AST: 15 [IU]/L (ref 0–40)
Albumin: 3.8 g/dL (ref 3.7–4.7)
Alkaline Phosphatase: 99 [IU]/L (ref 44–121)
BUN/Creatinine Ratio: 19 (ref 12–28)
BUN: 15 mg/dL (ref 8–27)
Bilirubin Total: 0.5 mg/dL (ref 0.0–1.2)
CO2: 22 mmol/L (ref 20–29)
Calcium: 8.7 mg/dL (ref 8.7–10.3)
Chloride: 104 mmol/L (ref 96–106)
Creatinine, Ser: 0.79 mg/dL (ref 0.57–1.00)
Globulin, Total: 2.8 g/dL (ref 1.5–4.5)
Glucose: 93 mg/dL (ref 70–99)
Potassium: 4.1 mmol/L (ref 3.5–5.2)
Sodium: 140 mmol/L (ref 134–144)
Total Protein: 6.6 g/dL (ref 6.0–8.5)
eGFR: 72 mL/min/{1.73_m2} (ref >59)

## 2023-01-24 LAB — CBC WITH DIFFERENTIAL/PLATELET
Basophils Absolute: 0 10*3/uL (ref 0.0–0.2)
Basos: 0 %
EOS (ABSOLUTE): 0.3 10*3/uL (ref 0.0–0.4)
Eos: 5 %
Hematocrit: 38.9 % (ref 34.0–46.6)
Hemoglobin: 12.7 g/dL (ref 11.1–15.9)
Immature Grans (Abs): 0 10*3/uL (ref 0.0–0.1)
Immature Granulocytes: 0 %
Lymphocytes Absolute: 1.4 10*3/uL (ref 0.7–3.1)
Lymphs: 22 %
MCH: 28.9 pg (ref 26.6–33.0)
MCHC: 32.6 g/dL (ref 31.5–35.7)
MCV: 88 fL (ref 79–97)
Monocytes Absolute: 0.4 10*3/uL (ref 0.1–0.9)
Monocytes: 7 %
Neutrophils Absolute: 4.1 10*3/uL (ref 1.4–7.0)
Neutrophils: 66 %
Platelets: 288 10*3/uL (ref 150–450)
RBC: 4.4 x10E6/uL (ref 3.77–5.28)
RDW: 13.8 % (ref 11.7–15.4)
WBC: 6.2 10*3/uL (ref 3.4–10.8)

## 2023-01-26 ENCOUNTER — Telehealth: Payer: Self-pay | Admitting: Family Medicine

## 2023-01-26 DIAGNOSIS — N3281 Overactive bladder: Secondary | ICD-10-CM

## 2023-01-26 MED ORDER — MYRBETRIQ 25 MG PO TB24: 25.0000 mg | ORAL_TABLET | Freq: Every day | ORAL | 5 refills | Status: DC

## 2023-01-26 NOTE — Telephone Encounter (Signed)
Sent brand name for her

## 2023-01-26 NOTE — Telephone Encounter (Signed)
Daughter made aware 

## 2023-01-26 NOTE — Telephone Encounter (Signed)
Daughter says that mirabegron ER (MYRBETRIQ) 25 MG TB24 tablet is OTC and insurance will not cover. Daughter is asking for brand name to be called in to be covered. Use [facility]

## 2023-01-26 NOTE — Addendum Note (Signed)
Addended by: Arville Care on: 01/26/2023 01:26 PM   Modules accepted: Orders

## 2023-02-03 ENCOUNTER — Telehealth: Payer: Self-pay | Admitting: Family Medicine

## 2023-02-03 NOTE — Telephone Encounter (Signed)
Attempted to contact son and phone was answered but call was dropped. Looks like med was started at last visit for overactive bladder. Will attempt to contact son again

## 2023-02-04 NOTE — Telephone Encounter (Signed)
Pts son made aware and has no further concerns.

## 2023-03-25 ENCOUNTER — Other Ambulatory Visit: Payer: Self-pay | Admitting: Family Medicine

## 2023-04-13 ENCOUNTER — Telehealth: Payer: Self-pay

## 2023-04-13 DIAGNOSIS — M810 Age-related osteoporosis without current pathological fracture: Secondary | ICD-10-CM

## 2023-04-13 MED ORDER — DENOSUMAB 60 MG/ML ~~LOC~~ SOSY
60.0000 mg | PREFILLED_SYRINGE | Freq: Once | SUBCUTANEOUS | Status: AC
Start: 2023-04-27 — End: 2023-09-01
  Administered 2023-09-01: 60 mg via SUBCUTANEOUS

## 2023-04-13 NOTE — Telephone Encounter (Signed)
 Prolia ordered for PA team to review verification benefits

## 2023-04-21 ENCOUNTER — Telehealth: Payer: Self-pay

## 2023-04-21 NOTE — Telephone Encounter (Signed)
 Copied from CRM [id number]. Topic: Clinical - Medical Advice >> Apr 21, 2023 10:31 AM Desma Mcgregor wrote: Reason for CRM: Son called and asking when is the next time the patient is to be given the denosumab (PROLIA) 60 MG/ML SOSY injection. Cb#[phone]

## 2023-04-29 ENCOUNTER — Telehealth: Payer: Self-pay

## 2023-04-29 NOTE — Telephone Encounter (Signed)
 Prolia VOB initiated via [email or web address]  Next Prolia inj DUE: 05/12/23

## 2023-05-12 ENCOUNTER — Other Ambulatory Visit (HOSPITAL_COMMUNITY): Payer: Self-pay

## 2023-05-12 NOTE — Telephone Encounter (Signed)
 Marland Kitchen

## 2023-05-12 NOTE — Telephone Encounter (Signed)
Pt ready for scheduling for PROLIA on or after : 05/12/23  Out-of-pocket cost due at time of visit: $357 + $257 DEDUCTIBLE  Number of injection/visits approved: 2   Primary: UHC MEDICARE Prolia co-insurance: 20% Admin fee co-insurance: 20%  Secondary: NCMED Prolia co-insurance: no coverage for Prolia through the patient's secondary plan [place] MEDICAID due to an exclusion. Admin fee co-insurance:   Medical Benefit Details: Date Benefits were checked: 05/07/23 Deductible: $0 Met of $257 Required/ Coinsurance: 20%/ Admin Fee: 20%  Prior Auth: APPROVED PA# [id number] Expiration Date: 11/07/22-11/07/23  # of doses approved: 2  Pharmacy benefit: Copay $0 If patient wants fill through the pharmacy benefit please send prescription to: [facility], and include estimated need by date in rx notes. Pharmacy will ship medication directly to the office.  Patient NOT eligible for Prolia Copay Card. Copay Card can make patient's cost as little as $25. Link to apply: [email or web address]  ** This summary of benefits is an estimation of the patient's out-of-pocket cost. Exact cost may very based on individual plan coverage.

## 2023-05-23 ENCOUNTER — Other Ambulatory Visit: Payer: Self-pay | Admitting: Family Medicine

## 2023-05-23 DIAGNOSIS — M1712 Unilateral primary osteoarthritis, left knee: Secondary | ICD-10-CM

## 2023-05-26 ENCOUNTER — Other Ambulatory Visit: Payer: Self-pay | Admitting: Family Medicine

## 2023-05-26 DIAGNOSIS — M810 Age-related osteoporosis without current pathological fracture: Secondary | ICD-10-CM

## 2023-05-26 MED ORDER — DENOSUMAB 60 MG/ML ~~LOC~~ SOSY: 60.0000 mg | PREFILLED_SYRINGE | SUBCUTANEOUS | 2 refills | Status: DC

## 2023-06-12 ENCOUNTER — Ambulatory Visit: Payer: 59

## 2023-06-12 VITALS — Ht 59.0 in | Wt 118.0 lb

## 2023-06-12 DIAGNOSIS — Z0000 Encounter for general adult medical examination without abnormal findings: Secondary | ICD-10-CM

## 2023-06-12 NOTE — Patient Instructions (Signed)
[name] , Thank you for taking time to come for your Medicare Wellness Visit. I appreciate your ongoing commitment to your health goals. Please review the following plan we discussed and let me know if I can assist you in the future.   Referrals/Orders/Follow-Ups/Clinician Recommendations: Aim for 30 minutes of exercise or brisk walking, 6-8 glasses of water, and 5 servings of fruits and vegetables each day.  This is a list of the screening recommended for you and due dates:  Health Maintenance  Topic Date Due   Zoster (Shingles) Vaccine (1 of 2) Never done   COVID-19 Vaccine (4 - 2024-25 season) 12/21/2022   DEXA scan (bone density measurement)  02/14/2024   Medicare Annual Wellness Visit  06/11/2024   DTaP/Tdap/Td vaccine (2 - Td or Tdap) 01/17/2027   Pneumonia Vaccine  Completed   Flu Shot  Completed   HPV Vaccine  Aged Out    Advanced directives: (ACP Link)Information on Advanced Care Planning can be found at [facility] Advance Health Care Directives Advance Health Care Directives ([email or web address]   Next Medicare Annual Wellness Visit scheduled for next year: Yes

## 2023-06-12 NOTE — Progress Notes (Signed)
Subjective:   [name] is a 89 y.o. who presents for a Medicare Wellness preventive visit.  Visit Complete: Virtual I connected with  [name] on 06/12/23 by a audio enabled telemedicine application and verified that I am speaking with the correct person using two identifiers.  Patient Location: Home  Provider Location: Home Office  I discussed the limitations of evaluation and management by telemedicine. The patient expressed understanding and agreed to proceed.  Vital Signs: Because this visit was a virtual/telehealth visit, some criteria may be missing or patient reported. Any vitals not documented were not able to be obtained and vitals that have been documented are patient reported.  VideoDeclined- This patient declined Librarian, academic. Therefore the visit was completed with audio only.  AWV Questionnaire: No: Patient Medicare AWV questionnaire was not completed prior to this visit.  Cardiac Risk Factors include: advanced age (>50men, >50 women);dyslipidemia;hypertension     Objective:    Today's Vitals   06/12/23 1517  Weight: 118 lb (53.5 kg)  Height: 4\' 11"  (1.499 m)   Body mass index is 23.83 kg/m.     06/12/2023    5:24 PM 04/08/2022   12:12 PM 03/06/2022   10:04 AM 02/18/2021    3:36 PM 01/29/2016    6:27 AM 01/01/2016    7:56 AM 12/28/2015   12:56 PM  Advanced Directives  Does Patient Have a Medical Advance Directive? No No No No No No No  Would patient like information on creating a medical advance directive? Yes (MAU/Ambulatory/Procedural Areas - Information given) Yes (ED - Information included in AVS) Yes (MAU/Ambulatory/Procedural Areas - Information given) No - Patient declined No - patient declined information No - patient declined information     Current Medications (verified) Outpatient Encounter Medications as of 06/12/2023  Medication Sig   amLODipine (NORVASC) 5 MG tablet Take 1 tablet (5 mg total) by mouth  daily.   atorvastatin (LIPITOR) 80 MG tablet Take 1 tablet (80 mg total) by mouth daily.   denosumab (PROLIA) 60 MG/ML SOSY injection Inject 60 mg into the skin every 6 (six) months.   diclofenac (VOLTAREN) 75 MG EC tablet TAKE 1 TABLET BY MOUTH TWICE A DAY   loratadine (CLARITIN) 10 MG tablet Take 1 tablet (10 mg total) by mouth daily.   MYRBETRIQ 25 MG TB24 tablet Take 1 tablet (25 mg total) by mouth daily.   Pollen Extracts (PROSTAT PO) Take by mouth. Liquid prostat   Facility-Administered Encounter Medications as of 06/12/2023  Medication   denosumab (PROLIA) injection 60 mg    Allergies (verified) Penicillin g and Lisinopril   History: Past Medical History:  Diagnosis Date   Arthritis of right foot    Hx: UTI (urinary tract infection)    Hypertension    Osteoporosis    Palpitations    Unsteady gait    Past Surgical History:  Procedure Laterality Date   CATARACT EXTRACTION W/PHACO Right 01/01/2016   Procedure: CATARACT EXTRACTION PHACO AND INTRAOCULAR LENS PLACEMENT RIGHT EYE;  Surgeon: Jethro Bolus, MD;  Location: [facility];  Service: Ophthalmology;  Laterality: Right;  CDE: 9.69   CATARACT EXTRACTION W/PHACO Left 01/29/2016   Procedure: CATARACT EXTRACTION PHACO AND INTRAOCULAR LENS PLACEMENT (IOC);  Surgeon: Jethro Bolus, MD;  Location: [facility];  Service: Ophthalmology;  Laterality: Left;  CDE: 9.32   None     Family History  Problem Relation Age of Onset   Heart attack Mother 26       Died in  [place]   Healthy Daughter    Healthy Son    Healthy Son    Healthy Daughter    Allergic rhinitis Neg Hx    Angioedema Neg Hx    Asthma Neg Hx    Eczema Neg Hx    Immunodeficiency Neg Hx    Urticaria Neg Hx    Social History   Socioeconomic History   Marital status: Married    Spouse name: [name]   Number of children: 4   Years of education: Not on file   Highest education level: 3rd grade  Occupational History   Occupation: Homemaker  Tobacco Use   Smoking status:  Never   Smokeless tobacco: Never  Vaping Use   Vaping status: Never Used  Substance and Sexual Activity   Alcohol use: No    Alcohol/week: 0.0 standard drinks of alcohol   Drug use: Never   Sexual activity: Not on file  Other Topics Concern   Not on file  Social History Narrative   Lives at home with son  and sister in law and husband.     1 cup coffee per day.   Right-handed.   Social Drivers of Corporate investment banker Strain: Low Risk  (06/12/2023)   Overall Financial Resource Strain (CARDIA)    Difficulty of Paying Living Expenses: Not hard at all  Food Insecurity: No Food Insecurity (06/12/2023)   Hunger Vital Sign    Worried About Running Out of Food in the Last Year: Never true    Ran Out of Food in the Last Year: Never true  Transportation Needs: No Transportation Needs (06/12/2023)   PRAPARE - Administrator, Civil Service (Medical): No    Lack of Transportation (Non-Medical): No  Physical Activity: Insufficiently Active (06/12/2023)   Exercise Vital Sign    Days of Exercise per Week: 3 days    Minutes of Exercise per Session: 30 min  Stress: No Stress Concern Present (06/12/2023)   [name] of Occupational Health - Occupational Stress Questionnaire    Feeling of Stress : Not at all  Social Connections: Moderately Integrated (06/12/2023)   Social Connection and Isolation Panel [NHANES]    Frequency of Communication with Friends and Family: More than three times a week    Frequency of Social Gatherings with Friends and Family: Three times a week    Attends Religious Services: 1 to 4 times per year    Active Member of Clubs or Organizations: No    Attends Banker Meetings: Never    Marital Status: Married    Tobacco Counseling Counseling given: Not Answered    Clinical Intake:  Pre-visit preparation completed: Yes  Pain : No/denies pain     Diabetes: No  How often do you need to have someone help you when you read  instructions, pamphlets, or other written materials from your doctor or pharmacy?: 1 - Never  Interpreter Needed?: No  Comments: Assisted with visit by son Ignacia Bayley Information entered by :: Kandis Fantasia LPN   Activities of Daily Living     06/12/2023    5:23 PM  In your present state of health, do you have any difficulty performing the following activities:  Hearing? 0  Vision? 0  Difficulty concentrating or making decisions? 0  Walking or climbing stairs? 1  Dressing or bathing? 0  Doing errands, shopping? 1  Preparing Food and eating ? N  Using the Toilet? N  In the past six months, have you  accidently leaked urine? N  Do you have problems with loss of bowel control? N  Managing your Medications? N  Managing your Finances? N  Housekeeping or managing your Housekeeping? N    Patient Care Team: Dettinger, Elige Radon, MD as PCP - General (Family Medicine)  Indicate any recent Medical Services you may have received from other than [facility] providers in the past year (date may be approximate).     Assessment:   This is a routine wellness examination for [name].  Hearing/Vision screen Hearing Screening - Comments:: Denies hearing difficulties   Vision Screening - Comments:: No vision problems; will schedule routine eye exam soon      Depression Screen     06/12/2023    5:22 PM 01/23/2023   11:03 AM 08/14/2022   10:47 AM 04/08/2022   12:17 PM 03/06/2022    9:56 AM 02/12/2022    1:52 PM 10/16/2021    9:29 AM  PHQ 2/9 Scores  PHQ - 2 Score 0 0 0 0 0 0 0  PHQ- 9 Score  5 5        Fall Risk     06/12/2023    5:23 PM 01/23/2023   11:03 AM 08/14/2022   10:47 AM 04/08/2022   12:12 PM 03/06/2022   10:03 AM  Fall Risk   Falls in the past year? 0 0 1 1 1   Number falls in past yr: 0  1 0 0  Injury with Fall? 0  0 0 0  Risk for fall due to : No Fall Risks  Impaired balance/gait;Impaired mobility History of fall(s);Impaired balance/gait   Follow up Falls prevention  discussed;Education provided;Falls evaluation completed  Falls evaluation completed Falls evaluation completed     MEDICARE RISK AT HOME:  Medicare Risk at Home Any stairs in or around the home?: No If so, are there any without handrails?: No Home free of loose throw rugs in walkways, pet beds, electrical cords, etc?: Yes Adequate lighting in your home to reduce risk of falls?: Yes Life alert?: No Use of a cane, walker or w/c?: Yes Grab bars in the bathroom?: Yes Shower chair or bench in shower?: No Elevated toilet seat or a handicapped toilet?: Yes  TIMED UP AND GO:  Was the test performed?  No  Cognitive Function: 6CIT completed    06/18/2015    3:10 PM 01/05/2015    1:22 PM  MMSE - Mini Mental State Exam  Orientation to time 5 5  Orientation to Place 5 5  Registration 3 2  Attention/ Calculation 5 5  Recall 3 3  Language- name 2 objects 2 2  Language- repeat 1 1  Language- follow 3 step command 3 3  Language- read & follow direction 1 1  Write a sentence 1 1  Copy design 1 1  Total score 30 29        06/12/2023    5:24 PM 04/08/2022   12:17 PM 02/18/2021    3:45 PM  6CIT Screen  What Year? 0 points 0 points 0 points  What month? 0 points 0 points 0 points  What time? 0 points 0 points 0 points  Count back from 20 0 points 0 points 0 points  Months in reverse 2 points 4 points 4 points  Repeat phrase 4 points 6 points 4 points  Total Score 6 points 10 points 8 points    Immunizations Immunization History  Administered Date(s) Administered   Fluad Quad(high Dose 65+) 01/23/2020, 01/24/2021, 02/13/2022  Fluad Trivalent(High Dose 65+) 01/23/2023   Influenza, High Dose Seasonal PF 02/05/2018   Moderna Sars-Covid-2 Vaccination 06/27/2019, 07/25/2019   PFIZER(Purple Top)SARS-COV-2 Vaccination 04/25/2020   PNEUMOCOCCAL CONJUGATE-20 08/14/2022   Pneumococcal Conjugate-13 07/25/2020   Tdap 01/16/2017    Screening Tests Health Maintenance  Topic Date Due    Zoster Vaccines- Shingrix (1 of 2) Never done   COVID-19 Vaccine (4 - 2024-25 season) 12/21/2022   DEXA SCAN  02/14/2024   Medicare Annual Wellness (AWV)  06/11/2024   DTaP/Tdap/Td (2 - Td or Tdap) 01/17/2027   Pneumonia Vaccine 38+ Years old  Completed   INFLUENZA VACCINE  Completed   HPV VACCINES  Aged Out    Health Maintenance  Health Maintenance Due  Topic Date Due   Zoster Vaccines- Shingrix (1 of 2) Never done   COVID-19 Vaccine (4 - 2024-25 season) 12/21/2022    Additional Screening:  Vision Screening: Recommended annual ophthalmology exams for early detection of glaucoma and other disorders of the eye.  Dental Screening: Recommended annual dental exams for proper oral hygiene  Community Resource Referral / Chronic Care Management: CRR required this visit?  No   CCM required this visit?  No     Plan:     I have personally reviewed and noted the following in the patient's chart:   Medical and social history Use of alcohol, tobacco or illicit drugs  Current medications and supplements including opioid prescriptions. Patient is not currently taking opioid prescriptions. Functional ability and status Nutritional status Physical activity Advanced directives List of other physicians Hospitalizations, surgeries, and ER visits in previous 12 months Vitals Screenings to include cognitive, depression, and falls Referrals and appointments  In addition, I have reviewed and discussed with patient certain preventive protocols, quality metrics, and best practice recommendations. A written personalized care plan for preventive services as well as general preventive health recommendations were provided to patient.     Kandis Fantasia [place], [place]   07/28/8117   After Visit Summary: (MyChart) Due to this being a telephonic visit, the after visit summary with patients personalized plan was offered to patient via MyChart   Notes: Nothing significant to report at this  time.

## 2023-06-13 ENCOUNTER — Other Ambulatory Visit: Payer: Self-pay | Admitting: Family Medicine

## 2023-07-16 ENCOUNTER — Telehealth: Payer: Self-pay

## 2023-07-16 NOTE — Telephone Encounter (Signed)
 Copied from CRM [id number]. Topic: Clinical - Medication Question >> Jul 16, 2023  9:52 AM DeAngela L wrote: Reason for CRM: Patient son call about scheduling appt for denosumab (PROLIA) 60 MG/ML SOSY injection and his call back [phone], he will be bringing her to the appt

## 2023-07-16 NOTE — Telephone Encounter (Signed)
 Advised the patient's son to contact the pharmacy - injection was sent in and needs approval of patient to be filled and shipped to the office. Contact # was given for [facility].

## 2023-07-21 ENCOUNTER — Telehealth: Payer: Self-pay

## 2023-07-21 NOTE — Telephone Encounter (Unsigned)
 Copied from CRM [id number]. Topic: Clinical - Prescription Issue >> Jul 21, 2023 10:59 AM Gery Pray wrote: Reason for CRM: Ignacia Bayley, son, called regarding denosumab (PROLIA) injection 60 mg. Ignacia Bayley stated that [facility] will not speak to him to order the prescription for his mom. He would like to know if they are able to get it from [facility]? Please contact him back at [phone]

## 2023-07-22 MED ORDER — DENOSUMAB 60 MG/ML ~~LOC~~ SOSY: 60.0000 mg | PREFILLED_SYRINGE | Freq: Once | SUBCUTANEOUS | 0 refills | Status: AC

## 2023-07-22 NOTE — Telephone Encounter (Signed)
 Sent [facility] into [facility] - notified patient's son.

## 2023-07-24 ENCOUNTER — Encounter: Payer: Self-pay | Admitting: Family Medicine

## 2023-07-24 ENCOUNTER — Ambulatory Visit: Payer: 59 | Admitting: Family Medicine

## 2023-07-24 VITALS — BP 123/66 | HR 70 | Ht 59.0 in | Wt 121.0 lb

## 2023-07-24 DIAGNOSIS — E785 Hyperlipidemia, unspecified: Secondary | ICD-10-CM

## 2023-07-24 DIAGNOSIS — M1712 Unilateral primary osteoarthritis, left knee: Secondary | ICD-10-CM | POA: Diagnosis not present

## 2023-07-24 DIAGNOSIS — I10 Essential (primary) hypertension: Secondary | ICD-10-CM

## 2023-07-24 DIAGNOSIS — K219 Gastro-esophageal reflux disease without esophagitis: Secondary | ICD-10-CM

## 2023-07-24 DIAGNOSIS — N3281 Overactive bladder: Secondary | ICD-10-CM

## 2023-07-24 MED ORDER — DICLOFENAC SODIUM 75 MG PO TBEC: 75.0000 mg | DELAYED_RELEASE_TABLET | Freq: Two times a day (BID) | ORAL | 3 refills | Status: DC

## 2023-07-24 MED ORDER — ATORVASTATIN CALCIUM 80 MG PO TABS: 80.0000 mg | ORAL_TABLET | Freq: Every day | ORAL | 3 refills | Status: DC

## 2023-07-24 MED ORDER — MYRBETRIQ 25 MG PO TB24: 25.0000 mg | ORAL_TABLET | Freq: Every day | ORAL | 5 refills | Status: DC

## 2023-07-24 NOTE — Progress Notes (Signed)
 BP 123/66   Pulse 70   Ht 4\' 11"  (1.499 m)   Wt 121 lb (54.9 kg)   SpO2 97%   BMI 24.44 kg/m    Subjective:   Patient ID: [name], female    DOB: 1932/08/26, 89 y.o.   MRN: [id number]  HPI: [name] is a 89 y.o. female presenting on 07/24/2023 for Medical Management of Chronic Issues, Hypertension, and Hyperlipidemia   HPI Hypertension Patient is currently on amlodipine, and their blood pressure today is 123/66. Patient denies any lightheadedness or dizziness. Patient denies headaches, blurred vision, chest pains, shortness of breath, or weakness. Denies any side effects from medication and is content with current medication.   Hyperlipidemia Patient is coming in for recheck of his hyperlipidemia. The patient is currently taking Lipitor. They deny any issues with myalgias or history of liver damage from it. They deny any focal numbness or weakness or chest pain.   GERD Patient is currently on no medicine, doing well currently.  She denies any major symptoms or abdominal pain or belching or burping. She denies any blood in her stool or lightheadedness or dizziness.   Overactive bladder recheck Patient is currently taking Myrbetriq and I do feel like it is helping her some.  They deny any side effects.  They will continue forward with it.  Relevant past medical, surgical, family and social history reviewed and updated as indicated. Interim medical history since our last visit reviewed. Allergies and medications reviewed and updated.  Review of Systems  Constitutional:  Negative for chills and fever.  HENT:  Negative for congestion, ear discharge and ear pain.   Eyes:  Negative for redness and visual disturbance.  Respiratory:  Negative for chest tightness and shortness of breath.   Cardiovascular:  Negative for chest pain and leg swelling.  Genitourinary:  Negative for difficulty urinating and dysuria.  Musculoskeletal:  Positive for arthralgias. Negative for back  pain and gait problem.  Skin:  Negative for rash.  Neurological:  Negative for dizziness, light-headedness and headaches.  Psychiatric/Behavioral:  Negative for agitation and behavioral problems.   All other systems reviewed and are negative.   Per HPI unless specifically indicated above   Allergies as of 07/24/2023       Reactions   Penicillin G Hives   Lisinopril Swelling   Tongue swelling        Medication List        Accurate as of July 24, 2023 11:05 AM. If you have any questions, ask your nurse or doctor.          amLODipine 5 MG tablet Commonly known as: NORVASC Take 1 tablet (5 mg total) by mouth daily.   atorvastatin 80 MG tablet Commonly known as: LIPITOR Take 1 tablet (80 mg total) by mouth daily.   diclofenac 75 MG EC tablet Commonly known as: VOLTAREN Take 1 tablet (75 mg total) by mouth 2 (two) times daily.   loratadine 10 MG tablet Commonly known as: CLARITIN Take 1 tablet (10 mg total) by mouth daily.   Myrbetriq 25 MG Tb24 tablet Generic drug: mirabegron ER Take 1 tablet (25 mg total) by mouth daily.   PROSTAT PO Take by mouth. Liquid prostat         Objective:   BP 123/66   Pulse 70   Ht 4\' 11"  (1.499 m)   Wt 121 lb (54.9 kg)   SpO2 97%   BMI 24.44 kg/m   Wt Readings from Last 3 Encounters:  07/24/23 121 lb (54.9 kg)  06/12/23 118 lb (53.5 kg)  01/23/23 118 lb (53.5 kg)    Physical Exam Vitals and nursing note reviewed.  Constitutional:      General: She is not in acute distress.    Appearance: She is well-developed. She is not diaphoretic.  Eyes:     Conjunctiva/sclera: Conjunctivae normal.  Cardiovascular:     Rate and Rhythm: Normal rate and regular rhythm.     Heart sounds: Normal heart sounds. No murmur heard. Pulmonary:     Effort: Pulmonary effort is normal. No respiratory distress.     Breath sounds: Normal breath sounds. No wheezing.  Skin:    General: Skin is warm and dry.     Findings: No rash.   Neurological:     Mental Status: She is alert and oriented to person, place, and time.     Coordination: Coordination normal.  Psychiatric:        Behavior: Behavior normal.       Assessment & Plan:   Problem List Items Addressed This Visit       Cardiovascular and Mediastinum   HTN (hypertension) - Primary   Relevant Medications   atorvastatin (LIPITOR) 80 MG tablet     Digestive   GERD (gastroesophageal reflux disease)     Other   Dyslipidemia   Relevant Medications   atorvastatin (LIPITOR) 80 MG tablet   Other Visit Diagnoses       Primary osteoarthritis of left knee       Relevant Medications   diclofenac (VOLTAREN) 75 MG EC tablet     OAB (overactive bladder)       Relevant Medications   MYRBETRIQ 25 MG TB24 tablet       Continue current medicine, wants to hold off to do blood work till next time.  No changes Follow up plan: Return in about 6 months (around 01/23/2024), or if symptoms worsen or fail to improve, for Hypertension lipidemia.  Counseling provided for all of the vaccine components No orders of the defined types were placed in this encounter.   Arville Care, MD [facility] 07/24/2023, 11:05 AM

## 2023-07-31 NOTE — Telephone Encounter (Signed)
Called to follow up. LMTCB.

## 2023-08-06 ENCOUNTER — Telehealth: Payer: Self-pay

## 2023-08-06 NOTE — Telephone Encounter (Signed)
 Copied from CRM [id number]. Topic: General - Other >> Aug 06, 2023  2:47 PM Felizardo Hotter wrote: Reason for CRM: Pt's son Verginia Toohey ph: [phone], son returning Melissa's call regarding regarding denosumab (PROLIA) injection 60 mg. Please call [name].

## 2023-08-10 NOTE — Telephone Encounter (Signed)
Called son   LMTCB

## 2023-08-14 ENCOUNTER — Other Ambulatory Visit: Payer: Self-pay

## 2023-08-14 DIAGNOSIS — M810 Age-related osteoporosis without current pathological fracture: Secondary | ICD-10-CM

## 2023-08-14 MED ORDER — DENOSUMAB 60 MG/ML ~~LOC~~ SOSY: 60.0000 mg | PREFILLED_SYRINGE | Freq: Once | SUBCUTANEOUS | 0 refills | Status: AC

## 2023-08-21 ENCOUNTER — Telehealth: Payer: Self-pay

## 2023-08-21 NOTE — Telephone Encounter (Signed)
 Prolia

## 2023-08-21 NOTE — Telephone Encounter (Signed)
 Copied from CRM [id number]. Topic: General - Other >> Aug 21, 2023  1:40 PM [name] wrote: Reason for CRM: [facility] called to inform that this pt's denosumab  (PROLIA ) injection 60 mg will be delivered to the office 08/28/2023. A signature is required and it requires refrigeration.

## 2023-08-24 NOTE — Telephone Encounter (Signed)
 Patient's injection will be in 08/28/2023 per pharmacy - will contact at that time to schedule appointment

## 2023-08-24 NOTE — Telephone Encounter (Signed)
 Noted - will contact patient when arrives to make appt

## 2023-09-01 ENCOUNTER — Ambulatory Visit

## 2023-09-01 DIAGNOSIS — M810 Age-related osteoporosis without current pathological fracture: Secondary | ICD-10-CM | POA: Diagnosis not present

## 2023-09-01 DIAGNOSIS — Z0279 Encounter for issue of other medical certificate: Secondary | ICD-10-CM

## 2023-09-01 NOTE — Progress Notes (Signed)
 Patient is in office today for a nurse visit for  Prolia injection . Patient Injection was given in the  Left arm. Patient tolerated injection well.

## 2023-09-04 ENCOUNTER — Telehealth: Payer: Self-pay | Admitting: Family Medicine

## 2023-09-04 NOTE — Telephone Encounter (Signed)
 [name] (son) dropped off Handicap forms to be completed and signed.  Form Fee Paid? (Y/N)      yes      If NO, form is placed on front office manager desk to hold until payment received. If YES, then form will be placed in the RX/HH Nurse Coordinators box for completion.  Form will not be processed until payment is received

## 2023-12-23 ENCOUNTER — Ambulatory Visit: Admitting: Family Medicine

## 2023-12-23 ENCOUNTER — Ambulatory Visit: Payer: Self-pay

## 2023-12-23 NOTE — Telephone Encounter (Signed)
 FYI Only or Action Required?: FYI only for provider.  Patient was last seen in primary care on 07/24/2023 by Dettinger, Fonda LABOR, MD.  Called Nurse Triage reporting Leg Swelling, pain throughout leg, denies injury, and sudden onset 1+ pitting edema.  Symptoms began yesterday.  Interventions attempted: Nothing.  Symptoms are: sudden and significant.  Triage Disposition: See HCP Within 4 Hours (Or PCP Triage)  Patient/caregiver understands and will follow disposition?: Yes     Copied from CRM [id number]. Topic: Clinical - Red Word Triage >> Dec 23, 2023  3:33 PM Lauren C wrote: Red Word that prompted transfer to Nurse Triage: rt lower leg swelling without pain. [name] with house calls by [employer] on the line. Reason for Disposition  [1] Thigh, calf, or ankle swelling AND [2] only 1 side  Answer Assessment - Initial Assessment Questions [name] with [employer] on phone for pt and husband [name] reporting after triage of husband that pt may be more acute than husband  1. ONSET: When did the swelling start? (e.g., minutes, hours, days)     Just last night with no injury 2. LOCATION: What part of the leg is swollen?  Are both legs swollen or just one leg?     Right lower leg, knee through the foot 3. SEVERITY: How bad is the swelling? (e.g., localized; mild, moderate, severe)     1+ pitting edema 4. REDNESS: Is there redness or signs of infection?     no 5. PAIN: Is the swelling painful to touch? If Yes, ask: How painful is it?   (Scale 1-10; mild, moderate or severe)     No, tenderness through the leg to the touch but not a specific area Have some question of dementia, said no pain until we looked at her legs then said feeling pain, says she didn't fall or get hurt but not sure 6. FEVER: Do you have a fever? If Yes, ask: What is it, how was it measured, and when did it start?      no 8. MEDICAL HISTORY: Do you have a history of blood  clots (e.g., DVT), cancer, heart failure, kidney disease, or liver failure?     no 9. RECURRENT SYMPTOM: Have you had leg swelling before? If Yes, ask: When was the last time? What happened that time?     no 10. OTHER SYMPTOMS: Do you have any other symptoms? (e.g., chest pain, difficulty breathing)       No chest pain, SOB   [name] got pt daughter on phone to confirm that able to take pt to appt this afternoon, scheduled     Advised pt be examined in next 4 hours, scheduled appt for this afternoon with PCP office per family preference. Advised call back or seek immediate care for worsening symptoms.  Protocols used: Leg Swelling and Edema-A-AH

## 2023-12-24 ENCOUNTER — Emergency Department (HOSPITAL_BASED_OUTPATIENT_CLINIC_OR_DEPARTMENT_OTHER)
Admission: EM | Admit: 2023-12-24 | Discharge: 2023-12-24 | Disposition: A | Discharge status: Home / Self Care | Attending: Emergency Medicine | Admitting: Emergency Medicine

## 2023-12-24 ENCOUNTER — Telehealth: Payer: Self-pay

## 2023-12-24 ENCOUNTER — Emergency Department (HOSPITAL_BASED_OUTPATIENT_CLINIC_OR_DEPARTMENT_OTHER)

## 2023-12-24 ENCOUNTER — Other Ambulatory Visit: Payer: Self-pay

## 2023-12-24 ENCOUNTER — Encounter: Payer: Self-pay | Admitting: Nurse Practitioner

## 2023-12-24 ENCOUNTER — Encounter (HOSPITAL_BASED_OUTPATIENT_CLINIC_OR_DEPARTMENT_OTHER): Payer: Self-pay

## 2023-12-24 ENCOUNTER — Ambulatory Visit: Admitting: Nurse Practitioner

## 2023-12-24 VITALS — BP 117/54 | HR 76 | Temp 98.0°F | Ht 59.0 in | Wt 120.0 lb

## 2023-12-24 DIAGNOSIS — M19071 Primary osteoarthritis, right ankle and foot: Secondary | ICD-10-CM | POA: Diagnosis not present

## 2023-12-24 DIAGNOSIS — M79604 Pain in right leg: Secondary | ICD-10-CM | POA: Diagnosis not present

## 2023-12-24 DIAGNOSIS — M79661 Pain in right lower leg: Secondary | ICD-10-CM | POA: Diagnosis not present

## 2023-12-24 DIAGNOSIS — I10 Essential (primary) hypertension: Secondary | ICD-10-CM | POA: Insufficient documentation

## 2023-12-24 DIAGNOSIS — M7989 Other specified soft tissue disorders: Secondary | ICD-10-CM | POA: Diagnosis present

## 2023-12-24 DIAGNOSIS — Z79899 Other long term (current) drug therapy: Secondary | ICD-10-CM | POA: Diagnosis not present

## 2023-12-24 DIAGNOSIS — R600 Localized edema: Secondary | ICD-10-CM | POA: Diagnosis not present

## 2023-12-24 DIAGNOSIS — R2241 Localized swelling, mass and lump, right lower limb: Secondary | ICD-10-CM | POA: Diagnosis not present

## 2023-12-24 MED ORDER — ENOXAPARIN SODIUM 60 MG/0.6ML IJ SOSY
50.0000 mg | PREFILLED_SYRINGE | Freq: Once | INTRAMUSCULAR | Status: AC
  Administered 2023-12-24: 50 mg via SUBCUTANEOUS
  Filled 2023-12-24: qty 0.6

## 2023-12-24 MED ORDER — DOXYCYCLINE HYCLATE 100 MG PO CAPS: 100.0000 mg | ORAL_CAPSULE | Freq: Two times a day (BID) | ORAL | 0 refills | Status: AC

## 2023-12-24 MED ORDER — DOXYCYCLINE HYCLATE 100 MG PO TABS
100.0000 mg | ORAL_TABLET | Freq: Once | ORAL | Status: AC
  Administered 2023-12-24: 100 mg via ORAL
  Filled 2023-12-24: qty 1

## 2023-12-24 NOTE — ED Triage Notes (Signed)
 Pt c/o right lower leg swelling, ongoing for 'a few days. +2 pitting edema and some warmth noted to RLE. Skin warm, dry. Denies any injury/trauma. PCP sent here for DVT rule out. Denies SHOB/CP. Denies any previous hx of clots.

## 2023-12-24 NOTE — ED Notes (Signed)
 Reviewed discharge instructions, medications, and home care with pt and family. Both verbalized understanding and had no further questions. Pt exited ED without complications.

## 2023-12-24 NOTE — Discharge Instructions (Signed)
 Return in the morning for Doppler study of the right leg to rule out DVT.  They should tell you the time before you go.  Have your nurse check.  You received Lovenox  here tonight to protect you from any untoward events related to possible DVT.  Take the antibiotic doxycycline  as directed in case this is a early cellulitis.  Definitely follow-up with your primary care doctor.

## 2023-12-24 NOTE — Progress Notes (Signed)
   Subjective:    Patient ID: Scotland Dost, female    DOB: 09-02-1932, 89 y.o.   MRN: [id number]   Chief Complaint: leg swelling  HPI  Right lower leg swelling and tenderness. Started 2 days ago. Some worse.  Patient Active Problem List   Diagnosis Date Noted   Nail abnormality 12/11/2021   PAD (peripheral artery disease) (HCC) 11/13/2021   Language barrier 11/13/2021   Penicillin allergy 10/16/2021   Dyslipidemia 01/24/2021   Osteoporosis 07/25/2020   Rheumatoid arthritis involving multiple sites with positive rheumatoid factor (HCC) 05/10/2018   GERD (gastroesophageal reflux disease) 02/05/2018   Early stage nonexudative age-related macular degeneration of both eyes 08/06/2017   Thyroid  nodule 02/21/2015   Dysarthria 02/19/2015   HTN (hypertension) 02/19/2015   History of TIA (transient ischemic attack) 02/19/2015   Impaired weight bearing        Review of Systems  Constitutional:  Negative for diaphoresis.  Eyes:  Negative for pain.  Respiratory:  Negative for shortness of breath.   Cardiovascular:  Negative for chest pain, palpitations and leg swelling.  Gastrointestinal:  Negative for abdominal pain.  Endocrine: Negative for polydipsia.  Skin:  Negative for rash.  Neurological:  Negative for dizziness, weakness and headaches.  Hematological:  Does not bruise/bleed easily.  All other systems reviewed and are negative.      Objective:   Physical Exam Constitutional:      Appearance: Normal appearance.  Cardiovascular:     Rate and Rhythm: Normal rate and regular rhythm.     Heart sounds: Normal heart sounds.  Pulmonary:     Breath sounds: Normal breath sounds.  Musculoskeletal:     Comments: Right  lower leg edema Negative homan sign Palpable pedal pulse  Skin:    General: Skin is warm.  Neurological:     General: No focal deficit present.     Mental Status: She is alert and oriented to person, place, and time.  Psychiatric:        Mood and Affect:  Mood normal.        Behavior: Behavior normal.      BP (!) 117/54   Pulse 76   Temp 98 F (36.7 C) (Temporal)   Ht 4' 11 (1.499 m)   Wt 120 lb (54.4 kg)   SpO2 95%   BMI 24.24 kg/m        Assessment & Plan:   Lauryl Seyer in today with chief complaint of Right calf pain   1. Pain and swelling of right lower leg (Primary) To [facility] for evaluation for possible blood clot    The above assessment and management plan was discussed with the patient. The patient verbalized understanding of and has agreed to the management plan. Patient is aware to call the clinic if symptoms persist or worsen. Patient is aware when to return to the clinic for a follow-up visit. Patient educated on when it is appropriate to go to the emergency department.   Mary-Margaret Gladis, FNP

## 2023-12-24 NOTE — Telephone Encounter (Signed)
 Copied from CRM [id number]. Topic: Clinical - Home Health Verbal Orders >> Dec 24, 2023 10:17 AM Delon DASEN wrote: Caller/Agency: Delon with [facility] Callback Number: [phone] Service Requested: nursing through insurance Frequency: n/a Any new concerns about the patient? Yes- patient has cardiac murmur, pac in both lower extremities

## 2023-12-24 NOTE — Telephone Encounter (Signed)
 Go ahead and see about make an appointment for both of these and we can listen for him and look at them and bring any information at the pulm nurses or results gave to them.

## 2023-12-24 NOTE — ED Provider Notes (Addendum)
 [place] EMERGENCY DEPARTMENT AT [facility] Provider Note   CSN: [id number] Arrival date & time: 12/24/23  1815     Patient presents with: Leg Swelling (R)   [name] is a 89 y.o. female.   Patient sent in from [facility] for Doppler study to rule out DVT of the right lower extremity.  Patient's had leg swelling there for a few days 2+ pitting edema.  No swelling to the thigh or knee.  A little bit of erythema a little bit of increased warmth.  No chest pain no shortness of breath.  No history of DVTs in the past not on blood thinners.  No injury.  Unfortunately we do not have ultrasound here today.  Past medical history significant for UTIs hypertension.       Prior to Admission medications   Medication Sig Start Date End Date Taking? Authorizing Provider  amLODipine  (NORVASC ) 5 MG tablet Take 1 tablet (5 mg total) by mouth daily. 01/23/23   Dettinger, Fonda LABOR, MD  atorvastatin  (LIPITOR) 80 MG tablet Take 1 tablet (80 mg total) by mouth daily. 07/24/23   Dettinger, Fonda LABOR, MD  diclofenac  (VOLTAREN ) 75 MG EC tablet Take 1 tablet (75 mg total) by mouth 2 (two) times daily. 07/24/23   Dettinger, Fonda LABOR, MD  loratadine  (CLARITIN ) 10 MG tablet Take 1 tablet (10 mg total) by mouth daily. 01/23/23   Dettinger, Fonda LABOR, MD  MYRBETRIQ  25 MG TB24 tablet Take 1 tablet (25 mg total) by mouth daily. 07/24/23   Dettinger, Fonda LABOR, MD  Pollen Extracts (PROSTAT PO) Take by mouth. Liquid prostat    [provider]    Allergies: Penicillin g and Lisinopril     Review of Systems  Constitutional:  Negative for chills and fever.  HENT:  Negative for ear pain and sore throat.   Eyes:  Negative for pain and visual disturbance.  Respiratory:  Negative for cough and shortness of breath.   Cardiovascular:  Positive for leg swelling. Negative for chest pain and palpitations.  Gastrointestinal:  Negative for abdominal pain and vomiting.  Genitourinary:   Negative for dysuria and hematuria.  Musculoskeletal:  Negative for arthralgias and back pain.  Skin:  Negative for color change and rash.  Neurological:  Negative for seizures and syncope.  All other systems reviewed and are negative.   Updated Vital Signs BP 135/62   Pulse 79   Temp 97.6 F (36.4 C) (Temporal)   Resp 16   SpO2 98%   Physical Exam Vitals and nursing note reviewed.  Constitutional:      General: She is not in acute distress.    Appearance: Normal appearance. She is well-developed.  HENT:     Head: Normocephalic and atraumatic.  Eyes:     Extraocular Movements: Extraocular movements intact.     Conjunctiva/sclera: Conjunctivae normal.     Pupils: Pupils are equal, round, and reactive to light.  Cardiovascular:     Rate and Rhythm: Normal rate and regular rhythm.     Heart sounds: No murmur heard. Pulmonary:     Effort: Pulmonary effort is normal. No respiratory distress.     Breath sounds: Normal breath sounds. No wheezing, rhonchi or rales.  Abdominal:     Palpations: Abdomen is soft.     Tenderness: There is no abdominal tenderness.  Musculoskeletal:        General: Swelling present.     Cervical back: Normal range of motion and neck supple.  Comments: Swelling to the right calf right leg area.  Good cap refill to the toes.  Dorsalis pedis pulses 1+.  Sensation intact.  No swelling to the knee.  Slight erythema and slight increased warmth just proximal to the ankle area.  Skin:    General: Skin is warm and dry.     Capillary Refill: Capillary refill takes less than 2 seconds.  Neurological:     General: No focal deficit present.     Mental Status: She is alert and oriented to person, place, and time.  Psychiatric:        Mood and Affect: Mood normal.     (all labs ordered are listed, but only abnormal results are displayed) Labs Reviewed - No data to display  EKG: None  Radiology: No results found.   Procedures   Medications Ordered  in the ED - No data to display                                  Medical Decision Making Amount and/or Complexity of Data Reviewed Radiology: ordered.  Risk Prescription drug management.   Unfortunately we do not have ultrasound available here tonight.  Can do it tomorrow morning at 730.  Will go ahead and get x-ray tib-fib.  Maybe start her on doxycycline  and give her a dose of Lovenox  here.  And then she can come back tomorrow to have her ultrasound done.  No chest pain no shortness of breath oxygen saturation is 98% respirations are 16.  X-ray diffuse subcutaneous edema no acute fracture or dislocation.  Will go ahead and give dose of Lovenox  here and have her come back in the morning for the ultrasound as per request of her primary care doctor.  Also will start on doxycycline  and give the first dose here tonight.  A prescription for 7 days.  Patient without any history of bleeding or any head bleed problems in the past.   Final diagnoses:  Right leg swelling    ED Discharge Orders     None          Geraldene Hamilton, MD 12/24/23 2056    Geraldene Hamilton, MD 12/24/23 2056    Geraldene Hamilton, MD 12/24/23 7855    Geraldene Hamilton, MD 12/24/23 2148

## 2023-12-25 NOTE — Telephone Encounter (Signed)
 Appt 9/11 at 9:55am.  [name] made aware. He states that pt did go to [facility] and they do believe she has a DVT. They did not have u/s at the time. Pt was given a shot per [name] and is going back to [facility] today for u/s

## 2023-12-26 ENCOUNTER — Other Ambulatory Visit (HOSPITAL_BASED_OUTPATIENT_CLINIC_OR_DEPARTMENT_OTHER): Payer: Self-pay | Admitting: Emergency Medicine

## 2023-12-26 ENCOUNTER — Ambulatory Visit (HOSPITAL_BASED_OUTPATIENT_CLINIC_OR_DEPARTMENT_OTHER)
Admission: RE | Admit: 2023-12-26 | Discharge: 2023-12-26 | Disposition: A | Discharge status: Home / Self Care | Source: Ambulatory Visit | Attending: Emergency Medicine | Admitting: Emergency Medicine

## 2023-12-26 DIAGNOSIS — M7989 Other specified soft tissue disorders: Secondary | ICD-10-CM | POA: Diagnosis not present

## 2023-12-31 ENCOUNTER — Ambulatory Visit: Admitting: Family Medicine

## 2023-12-31 ENCOUNTER — Encounter: Payer: Self-pay | Admitting: Family Medicine

## 2023-12-31 VITALS — BP 134/77 | HR 71 | Temp 97.2°F | Ht 59.0 in | Wt 121.0 lb

## 2023-12-31 DIAGNOSIS — R011 Cardiac murmur, unspecified: Secondary | ICD-10-CM | POA: Diagnosis not present

## 2023-12-31 DIAGNOSIS — Z23 Encounter for immunization: Secondary | ICD-10-CM | POA: Diagnosis not present

## 2023-12-31 DIAGNOSIS — Z8673 Personal history of transient ischemic attack (TIA), and cerebral infarction without residual deficits: Secondary | ICD-10-CM

## 2023-12-31 DIAGNOSIS — R600 Localized edema: Secondary | ICD-10-CM

## 2023-12-31 NOTE — Progress Notes (Signed)
 BP 134/77   Pulse 71   Temp (!) 97.2 F (36.2 C)   Ht 4' 11 (1.499 m)   Wt 121 lb (54.9 kg)   SpO2 97%   BMI 24.44 kg/m    Subjective:   Patient ID: [name], female    DOB: 1932-08-09, 89 y.o.   MRN: [id number]  HPI: [name] is a 89 y.o. female presenting on 12/31/2023 for peripheral edema (BLE- On ATB. Son states that he has not seen improvement) and Heart Problem ([place])   Discussed the use of AI scribe software for clinical note transcription with the patient, who gave verbal consent to proceed.  History of Present Illness   [name] is an 89 year old female who presents with leg swelling and pain.  She has significant swelling and pain in one leg, described as 'hinchado' and 'me duele bastante', with the swelling more pronounced on one side. She has not used compression stockings previously.  She had a DVT ultrasound on the right lower extremity that was negative for DVT  She has a history of a heart murmur, previously evaluated with an echocardiogram in 2016. A nurse from her insurance company recently noted the murmur again. She does not report any current symptoms related to the heart murmur.  She is currently taking doxycycline , which she started yesterday, prescribed for a seven-day course. The redness in her leg has not worsened and may have improved since starting the antibiotic.          Relevant past medical, surgical, family and social history reviewed and updated as indicated. Interim medical history since our last visit reviewed. Allergies and medications reviewed and updated.  Review of Systems  Constitutional:  Negative for chills and fever.  Eyes:  Negative for visual disturbance.  Respiratory:  Negative for chest tightness and shortness of breath.   Cardiovascular:  Positive for leg swelling. Negative for chest pain.  Musculoskeletal:  Negative for back pain and gait problem.  Skin:  Negative for rash.  Neurological:  Negative for  light-headedness and headaches.  Psychiatric/Behavioral:  Negative for agitation and behavioral problems.   All other systems reviewed and are negative.   Per HPI unless specifically indicated above   Allergies as of 12/31/2023       Reactions   Penicillin G Hives   Lisinopril  Swelling   Tongue swelling        Medication List        Accurate as of December 31, 2023 10:32 AM. If you have any questions, ask your nurse or doctor.          amLODipine  5 MG tablet Commonly known as: NORVASC  Take 1 tablet (5 mg total) by mouth daily.   atorvastatin  80 MG tablet Commonly known as: LIPITOR Take 1 tablet (80 mg total) by mouth daily.   diclofenac  75 MG EC tablet Commonly known as: VOLTAREN  Take 1 tablet (75 mg total) by mouth 2 (two) times daily.   doxycycline  100 MG capsule Commonly known as: VIBRAMYCIN  Take 1 capsule (100 mg total) by mouth 2 (two) times daily for 7 days.   loratadine  10 MG tablet Commonly known as: CLARITIN  Take 1 tablet (10 mg total) by mouth daily.   Myrbetriq  25 MG Tb24 tablet Generic drug: mirabegron  ER Take 1 tablet (25 mg total) by mouth daily.   PROSTAT PO Take by mouth. Liquid prostat               Durable Medical Equipment  (From admission,  onward)           Start     Ordered   12/31/23 0000  For home use only DME Other see comment       Comments: Compression stockings, x 2, sized to fit 5 to 10 mmHg pressure Diagnosis peripheral edema  Question:  Length of Need  Answer:  Lifetime   12/31/23 1029             Objective:   BP 134/77   Pulse 71   Temp (!) 97.2 F (36.2 C)   Ht 4' 11 (1.499 m)   Wt 121 lb (54.9 kg)   SpO2 97%   BMI 24.44 kg/m   Wt Readings from Last 3 Encounters:  12/31/23 121 lb (54.9 kg)  12/24/23 120 lb (54.4 kg)  07/24/23 121 lb (54.9 kg)    Physical Exam Physical Exam   CARDIOVASCULAR: No signs of infection in the cardiovascular system.     3 out of 6 systolic murmur best heard  at second intercostal space Peripheral edema, 2+ in right lower extremity and trace in left lower extremity     Assessment & Plan:   Problem List Items Addressed This Visit   None Visit Diagnoses       Lower extremity edema    -  Primary   Worse right than left, DVT ultrasound was negative   Relevant Orders   For home use only DME Other see comment   ECHOCARDIOGRAM COMPLETE     Systolic murmur       Relevant Orders   For home use only DME Other see comment   ECHOCARDIOGRAM COMPLETE          Chronic lower extremity edema with pain Chronic unilateral lower extremity edema with pain likely due to nerve stretching. No infection signs. - Prescribe compression stockings. - Advise leg elevation when sitting or sleeping. - Encourage walking to promote circulation.  Recent lower extremity cellulitis, resolved Recent cellulitis resolved. No infection signs. On doxysaccharide to ensure full resolution. - Instruct to complete 7-day doxycycline  course. - Monitor for infection signs such as increased redness and contact if these occur.  Heart murmur Heart murmur noted, likely age-related valve changes. No severe dysfunction symptoms. Explained valve stiffening common with age, surgery needed only if significant stiffening occurs.          Follow up plan: Return in about 3 months (around 03/31/2024), or if symptoms worsen or fail to improve, for Blood work and regular appointment.  Counseling provided for all of the vaccine components Orders Placed This Encounter  Procedures   For home use only DME Other see comment   ECHOCARDIOGRAM COMPLETE    Fonda Levins, MD [facility] 12/31/2023, 10:32 AM

## 2023-12-31 NOTE — Addendum Note (Signed)
 Addended by: MARYANNE CHEW on: 12/31/2023 10:37 AM   Modules accepted: Orders

## 2024-01-06 ENCOUNTER — Ambulatory Visit: Payer: Self-pay | Admitting: Family Medicine

## 2024-01-06 ENCOUNTER — Ambulatory Visit (HOSPITAL_COMMUNITY)
Admission: RE | Admit: 2024-01-06 | Discharge: 2024-01-06 | Disposition: A | Discharge status: Home / Self Care | Source: Ambulatory Visit | Attending: Family Medicine | Admitting: Family Medicine

## 2024-01-06 DIAGNOSIS — R011 Cardiac murmur, unspecified: Secondary | ICD-10-CM | POA: Diagnosis not present

## 2024-01-06 DIAGNOSIS — R600 Localized edema: Secondary | ICD-10-CM | POA: Diagnosis not present

## 2024-01-06 LAB — ECHOCARDIOGRAM COMPLETE
AR max vel: 0.98 cm2
AV Area VTI: 1.06 cm2
AV Area mean vel: 1.01 cm2
AV Mean grad: 8.7 mmHg
AV Peak grad: 19.2 mmHg
Ao pk vel: 2.19 m/s
Area-P 1/2: 4.21 cm2
Calc EF: 58.5 %
MV M vel: 4.88 m/s
MV Peak grad: 95.1 mmHg
MV VTI: 1.96 cm2
S' Lateral: 3 cm
Single Plane A2C EF: 60.1 %
Single Plane A4C EF: 58.5 %

## 2024-01-07 NOTE — Progress Notes (Signed)
 Wheelchair order was faxed to [employer], they are OON, sent ordr to [employer] at [phone]

## 2024-01-10 ENCOUNTER — Emergency Department (HOSPITAL_BASED_OUTPATIENT_CLINIC_OR_DEPARTMENT_OTHER)

## 2024-01-10 ENCOUNTER — Other Ambulatory Visit: Payer: Self-pay

## 2024-01-10 ENCOUNTER — Inpatient Hospital Stay (HOSPITAL_BASED_OUTPATIENT_CLINIC_OR_DEPARTMENT_OTHER)
Admission: EM | Admit: 2024-01-10 | Discharge: 2024-01-14 | DRG: 815 | Disposition: A | Discharge status: Home Health | Attending: Family Medicine | Admitting: Family Medicine

## 2024-01-10 ENCOUNTER — Encounter (HOSPITAL_BASED_OUTPATIENT_CLINIC_OR_DEPARTMENT_OTHER): Payer: Self-pay

## 2024-01-10 ENCOUNTER — Other Ambulatory Visit (HOSPITAL_COMMUNITY): Payer: Self-pay

## 2024-01-10 DIAGNOSIS — E7800 Pure hypercholesterolemia, unspecified: Secondary | ICD-10-CM | POA: Diagnosis not present

## 2024-01-10 DIAGNOSIS — I340 Nonrheumatic mitral (valve) insufficiency: Secondary | ICD-10-CM | POA: Diagnosis present

## 2024-01-10 DIAGNOSIS — M810 Age-related osteoporosis without current pathological fracture: Secondary | ICD-10-CM | POA: Diagnosis not present

## 2024-01-10 DIAGNOSIS — Z9841 Cataract extraction status, right eye: Secondary | ICD-10-CM | POA: Diagnosis not present

## 2024-01-10 DIAGNOSIS — G319 Degenerative disease of nervous system, unspecified: Secondary | ICD-10-CM | POA: Diagnosis not present

## 2024-01-10 DIAGNOSIS — I6782 Cerebral ischemia: Secondary | ICD-10-CM | POA: Diagnosis not present

## 2024-01-10 DIAGNOSIS — Z888 Allergy status to other drugs, medicaments and biological substances status: Secondary | ICD-10-CM | POA: Diagnosis not present

## 2024-01-10 DIAGNOSIS — Z9842 Cataract extraction status, left eye: Secondary | ICD-10-CM | POA: Diagnosis not present

## 2024-01-10 DIAGNOSIS — E785 Hyperlipidemia, unspecified: Secondary | ICD-10-CM | POA: Diagnosis present

## 2024-01-10 DIAGNOSIS — S36020A Minor contusion of spleen, initial encounter: Secondary | ICD-10-CM | POA: Diagnosis not present

## 2024-01-10 DIAGNOSIS — R29810 Facial weakness: Secondary | ICD-10-CM | POA: Diagnosis present

## 2024-01-10 DIAGNOSIS — Z961 Presence of intraocular lens: Secondary | ICD-10-CM | POA: Diagnosis present

## 2024-01-10 DIAGNOSIS — Z880 Allergy status to penicillin: Secondary | ICD-10-CM | POA: Diagnosis not present

## 2024-01-10 DIAGNOSIS — K769 Liver disease, unspecified: Secondary | ICD-10-CM | POA: Diagnosis not present

## 2024-01-10 DIAGNOSIS — Z555 Less than a high school diploma: Secondary | ICD-10-CM | POA: Diagnosis not present

## 2024-01-10 DIAGNOSIS — I10 Essential (primary) hypertension: Secondary | ICD-10-CM | POA: Diagnosis not present

## 2024-01-10 DIAGNOSIS — E041 Nontoxic single thyroid nodule: Secondary | ICD-10-CM | POA: Diagnosis not present

## 2024-01-10 DIAGNOSIS — S82841A Displaced bimalleolar fracture of right lower leg, initial encounter for closed fracture: Secondary | ICD-10-CM | POA: Diagnosis not present

## 2024-01-10 DIAGNOSIS — M4856XA Collapsed vertebra, not elsewhere classified, lumbar region, initial encounter for fracture: Secondary | ICD-10-CM | POA: Diagnosis not present

## 2024-01-10 DIAGNOSIS — S36030A Superficial (capsular) laceration of spleen, initial encounter: Secondary | ICD-10-CM | POA: Diagnosis not present

## 2024-01-10 DIAGNOSIS — Z8249 Family history of ischemic heart disease and other diseases of the circulatory system: Secondary | ICD-10-CM | POA: Diagnosis not present

## 2024-01-10 DIAGNOSIS — Z043 Encounter for examination and observation following other accident: Secondary | ICD-10-CM | POA: Diagnosis not present

## 2024-01-10 DIAGNOSIS — Y92009 Unspecified place in unspecified non-institutional (private) residence as the place of occurrence of the external cause: Secondary | ICD-10-CM | POA: Diagnosis not present

## 2024-01-10 DIAGNOSIS — K5730 Diverticulosis of large intestine without perforation or abscess without bleeding: Secondary | ICD-10-CM | POA: Diagnosis present

## 2024-01-10 DIAGNOSIS — S2241XA Multiple fractures of ribs, right side, initial encounter for closed fracture: Secondary | ICD-10-CM | POA: Diagnosis not present

## 2024-01-10 DIAGNOSIS — S36039A Unspecified laceration of spleen, initial encounter: Secondary | ICD-10-CM | POA: Diagnosis not present

## 2024-01-10 DIAGNOSIS — S36031A Moderate laceration of spleen, initial encounter: Secondary | ICD-10-CM | POA: Diagnosis not present

## 2024-01-10 DIAGNOSIS — Z79899 Other long term (current) drug therapy: Secondary | ICD-10-CM | POA: Diagnosis not present

## 2024-01-10 DIAGNOSIS — W1830XA Fall on same level, unspecified, initial encounter: Secondary | ICD-10-CM | POA: Diagnosis present

## 2024-01-10 DIAGNOSIS — M85811 Other specified disorders of bone density and structure, right shoulder: Secondary | ICD-10-CM | POA: Diagnosis not present

## 2024-01-10 DIAGNOSIS — S8254XA Nondisplaced fracture of medial malleolus of right tibia, initial encounter for closed fracture: Secondary | ICD-10-CM | POA: Diagnosis not present

## 2024-01-10 DIAGNOSIS — M19071 Primary osteoarthritis, right ankle and foot: Secondary | ICD-10-CM | POA: Diagnosis not present

## 2024-01-10 DIAGNOSIS — R55 Syncope and collapse: Secondary | ICD-10-CM | POA: Diagnosis not present

## 2024-01-10 DIAGNOSIS — I700 Atherosclerosis of aorta: Secondary | ICD-10-CM | POA: Diagnosis present

## 2024-01-10 DIAGNOSIS — S82401A Unspecified fracture of shaft of right fibula, initial encounter for closed fracture: Secondary | ICD-10-CM | POA: Diagnosis not present

## 2024-01-10 DIAGNOSIS — S36039S Unspecified laceration of spleen, sequela: Secondary | ICD-10-CM

## 2024-01-10 DIAGNOSIS — S82891A Other fracture of right lower leg, initial encounter for closed fracture: Secondary | ICD-10-CM | POA: Diagnosis present

## 2024-01-10 DIAGNOSIS — S82892A Other fracture of left lower leg, initial encounter for closed fracture: Secondary | ICD-10-CM | POA: Diagnosis not present

## 2024-01-10 DIAGNOSIS — G238 Other specified degenerative diseases of basal ganglia: Secondary | ICD-10-CM | POA: Diagnosis not present

## 2024-01-10 DIAGNOSIS — S8261XA Displaced fracture of lateral malleolus of right fibula, initial encounter for closed fracture: Secondary | ICD-10-CM | POA: Diagnosis not present

## 2024-01-10 DIAGNOSIS — M2141 Flat foot [pes planus] (acquired), right foot: Secondary | ICD-10-CM | POA: Diagnosis not present

## 2024-01-10 DIAGNOSIS — I350 Nonrheumatic aortic (valve) stenosis: Secondary | ICD-10-CM | POA: Diagnosis not present

## 2024-01-10 DIAGNOSIS — J323 Chronic sphenoidal sinusitis: Secondary | ICD-10-CM | POA: Diagnosis not present

## 2024-01-10 DIAGNOSIS — I651 Occlusion and stenosis of basilar artery: Secondary | ICD-10-CM | POA: Diagnosis not present

## 2024-01-10 DIAGNOSIS — I4819 Other persistent atrial fibrillation: Secondary | ICD-10-CM | POA: Diagnosis present

## 2024-01-10 DIAGNOSIS — S8251XA Displaced fracture of medial malleolus of right tibia, initial encounter for closed fracture: Secondary | ICD-10-CM | POA: Diagnosis not present

## 2024-01-10 DIAGNOSIS — I672 Cerebral atherosclerosis: Secondary | ICD-10-CM | POA: Diagnosis not present

## 2024-01-10 DIAGNOSIS — M19011 Primary osteoarthritis, right shoulder: Secondary | ICD-10-CM | POA: Diagnosis not present

## 2024-01-10 DIAGNOSIS — I6523 Occlusion and stenosis of bilateral carotid arteries: Secondary | ICD-10-CM | POA: Diagnosis not present

## 2024-01-10 DIAGNOSIS — I4891 Unspecified atrial fibrillation: Secondary | ICD-10-CM | POA: Diagnosis present

## 2024-01-10 DIAGNOSIS — I6502 Occlusion and stenosis of left vertebral artery: Secondary | ICD-10-CM | POA: Diagnosis not present

## 2024-01-10 DIAGNOSIS — M2011 Hallux valgus (acquired), right foot: Secondary | ICD-10-CM | POA: Diagnosis not present

## 2024-01-10 LAB — CBC WITH DIFFERENTIAL/PLATELET
Abs Immature Granulocytes: 0.04 K/uL (ref 0.00–0.07)
Basophils Absolute: 0 K/uL (ref 0.0–0.1)
Basophils Relative: 0 %
Eosinophils Absolute: 0 K/uL (ref 0.0–0.5)
Eosinophils Relative: 0 %
HCT: 33.6 % — ABNORMAL LOW (ref 36.0–46.0)
Hemoglobin: 11.2 g/dL — ABNORMAL LOW (ref 12.0–15.0)
Immature Granulocytes: 0 %
Lymphocytes Relative: 11 %
Lymphs Abs: 1.4 K/uL (ref 0.7–4.0)
MCH: 28.1 pg (ref 26.0–34.0)
MCHC: 33.3 g/dL (ref 30.0–36.0)
MCV: 84.2 fL (ref 80.0–100.0)
Monocytes Absolute: 0.7 K/uL (ref 0.1–1.0)
Monocytes Relative: 6 %
Neutro Abs: 9.7 K/uL — ABNORMAL HIGH (ref 1.7–7.7)
Neutrophils Relative %: 83 %
Platelets: 302 K/uL (ref 150–400)
RBC: 3.99 MIL/uL (ref 3.87–5.11)
RDW: 14.8 % (ref 11.5–15.5)
WBC: 11.8 K/uL — ABNORMAL HIGH (ref 4.0–10.5)
nRBC: 0 % (ref 0.0–0.2)

## 2024-01-10 LAB — HEPATIC FUNCTION PANEL
ALT: 14 U/L (ref 0–44)
AST: 25 U/L (ref 15–41)
Albumin: 3.8 g/dL (ref 3.5–5.0)
Alkaline Phosphatase: 104 U/L (ref 38–126)
Bilirubin, Direct: 0.3 mg/dL — ABNORMAL HIGH (ref 0.0–0.2)
Indirect Bilirubin: 0.4 mg/dL (ref 0.3–0.9)
Total Bilirubin: 0.6 mg/dL (ref 0.0–1.2)
Total Protein: 6.9 g/dL (ref 6.5–8.1)

## 2024-01-10 LAB — BASIC METABOLIC PANEL WITH GFR
Anion gap: 15 (ref 5–15)
BUN: 25 mg/dL — ABNORMAL HIGH (ref 8–23)
CO2: 21 mmol/L — ABNORMAL LOW (ref 22–32)
Calcium: 9 mg/dL (ref 8.9–10.3)
Chloride: 99 mmol/L (ref 98–111)
Creatinine, Ser: 0.95 mg/dL (ref 0.44–1.00)
GFR, Estimated: 57 mL/min — ABNORMAL LOW (ref >60)
Glucose, Bld: 116 mg/dL — ABNORMAL HIGH (ref 70–99)
Potassium: 4.1 mmol/L (ref 3.5–5.1)
Sodium: 134 mmol/L — ABNORMAL LOW (ref 135–145)

## 2024-01-10 LAB — TROPONIN T, HIGH SENSITIVITY
Troponin T High Sensitivity: <15 ng/L (ref 0–19)
Troponin T High Sensitivity: <15 ng/L (ref 0–19)

## 2024-01-10 LAB — HEMOGLOBIN AND HEMATOCRIT, BLOOD
HCT: 30.6 % — ABNORMAL LOW (ref 36.0–46.0)
HCT: 29.8 % — ABNORMAL LOW (ref 36.0–46.0)
HCT: 28.7 % — ABNORMAL LOW (ref 36.0–46.0)
Hemoglobin: 10.4 g/dL — ABNORMAL LOW (ref 12.0–15.0)
Hemoglobin: 10 g/dL — ABNORMAL LOW (ref 12.0–15.0)
Hemoglobin: 9.7 g/dL — ABNORMAL LOW (ref 12.0–15.0)

## 2024-01-10 LAB — PRO BRAIN NATRIURETIC PEPTIDE: Pro Brain Natriuretic Peptide: 1457 pg/mL — ABNORMAL HIGH (ref <300.0)

## 2024-01-10 MED ORDER — ACETAMINOPHEN 325 MG PO TABS: 650.0000 mg | ORAL_TABLET | Freq: Four times a day (QID) | ORAL | Status: DC | PRN

## 2024-01-10 MED ORDER — FENTANYL CITRATE PF 50 MCG/ML IJ SOSY
50.0000 ug | PREFILLED_SYRINGE | Freq: Once | INTRAMUSCULAR | Status: AC
  Administered 2024-01-10: 50 ug via INTRAVENOUS
  Filled 2024-01-10: qty 1

## 2024-01-10 MED ORDER — ONDANSETRON HCL 4 MG/2ML IJ SOLN: 4.0000 mg | Freq: Four times a day (QID) | INTRAMUSCULAR | Status: DC | PRN

## 2024-01-10 MED ORDER — ONDANSETRON HCL 4 MG PO TABS: 4.0000 mg | ORAL_TABLET | Freq: Four times a day (QID) | ORAL | Status: DC | PRN

## 2024-01-10 MED ORDER — SODIUM CHLORIDE 0.9 % IV SOLN: INTRAVENOUS | Status: DC

## 2024-01-10 MED ORDER — HYDROCODONE-ACETAMINOPHEN 5-325 MG PO TABS
1.0000 | ORAL_TABLET | ORAL | Status: DC | PRN
  Administered 2024-01-10 (×3): 2 via ORAL
  Filled 2024-01-10 (×3): qty 2

## 2024-01-10 MED ORDER — ACETAMINOPHEN 650 MG RE SUPP: 650.0000 mg | Freq: Four times a day (QID) | RECTAL | Status: DC | PRN

## 2024-01-10 MED ORDER — FENTANYL CITRATE PF 50 MCG/ML IJ SOSY: 25.0000 ug | PREFILLED_SYRINGE | INTRAMUSCULAR | Status: DC | PRN

## 2024-01-10 MED ORDER — ATORVASTATIN CALCIUM 80 MG PO TABS
80.0000 mg | ORAL_TABLET | Freq: Every day | ORAL | Status: DC
  Administered 2024-01-10 – 2024-01-14 (×5): 80 mg via ORAL
  Filled 2024-01-10 (×5): qty 1

## 2024-01-10 MED ORDER — HYDROCODONE-ACETAMINOPHEN 5-325 MG PO TABS
1.0000 | ORAL_TABLET | Freq: Four times a day (QID) | ORAL | 0 refills | Status: AC | PRN
  Filled 2024-01-10: qty 28, 4d supply, fill #0

## 2024-01-10 MED ORDER — IOHEXOL 300 MG/ML  SOLN
100.0000 mL | Freq: Once | INTRAMUSCULAR | Status: AC | PRN
  Administered 2024-01-10: 70 mL via INTRAVENOUS

## 2024-01-10 MED ORDER — POLYETHYLENE GLYCOL 3350 17 G PO PACK: 17.0000 g | PACK | Freq: Every day | ORAL | Status: DC | PRN

## 2024-01-10 NOTE — Consult Note (Signed)
 ORTHOPAEDIC CONSULTATION  REQUESTING PHYSICIAN: Tobie Yetta HERO, MD  Chief Complaint: right ankle pain  HPI: [name] is a 89 y.o. female who complains of  acute right ankle pain following a fall from a syncopal episode.  Patient is Spanish speaking, family member provides bedside translation.  Patient had multiple findings on exam and imaging, including right ankle fracture, splenic laceration, and new onset Afib without RVR.  She is mainly walker-dependent.  Fracture nondisplaced on imaging of medial malleolus.  Hx of known arthritis in right foot.  Right lower leg placed in splint.  Denies numbness or tingling, or open wound to right leg.  Regards pain as constant and strong, especially when trying to rotate her ankle in the splint though is unable to do so.  No other concerns at this time.   Past Medical History:  Diagnosis Date   Arthritis of right foot    Hx: UTI (urinary tract infection)    Hypertension    Osteoporosis    Palpitations    Unsteady gait    Past Surgical History:  Procedure Laterality Date   CATARACT EXTRACTION W/PHACO Right 01/01/2016   Procedure: CATARACT EXTRACTION PHACO AND INTRAOCULAR LENS PLACEMENT RIGHT EYE;  Surgeon: Oneil Platts, MD;  Location: [facility];  Service: Ophthalmology;  Laterality: Right;  CDE: 9.69   CATARACT EXTRACTION W/PHACO Left 01/29/2016   Procedure: CATARACT EXTRACTION PHACO AND INTRAOCULAR LENS PLACEMENT (IOC);  Surgeon: Oneil Platts, MD;  Location: [facility];  Service: Ophthalmology;  Laterality: Left;  CDE: 9.32   None     Social History   Socioeconomic History   Marital status: Married    Spouse name: [name]   Number of children: 4   Years of education: Not on file   Highest education level: 3rd grade  Occupational History   Occupation: Homemaker  Tobacco Use   Smoking status: Never   Smokeless tobacco: Never  Vaping Use   Vaping status: Never Used  Substance and Sexual Activity   Alcohol use: No    Alcohol/week: 0.0  standard drinks of alcohol   Drug use: Never   Sexual activity: Not on file  Other Topics Concern   Not on file  Social History Narrative   Lives at home with son  and sister in law and husband.     1 cup coffee per day.   Right-handed.   Social Drivers of Corporate investment banker Strain: Low Risk  (06/12/2023)   Overall Financial Resource Strain (CARDIA)    Difficulty of Paying Living Expenses: Not hard at all  Food Insecurity: No Food Insecurity (06/12/2023)   Hunger Vital Sign    Worried About Running Out of Food in the Last Year: Never true    Ran Out of Food in the Last Year: Never true  Transportation Needs: No Transportation Needs (06/12/2023)   PRAPARE - Administrator, Civil Service (Medical): No    Lack of Transportation (Non-Medical): No  Physical Activity: Insufficiently Active (06/12/2023)   Exercise Vital Sign    Days of Exercise per Week: 3 days    Minutes of Exercise per Session: 30 min  Stress: No Stress Concern Present (06/12/2023)   [employer] of Occupational Health - Occupational Stress Questionnaire    Feeling of Stress : Not at all  Social Connections: Moderately Integrated (06/12/2023)   Social Connection and Isolation Panel    Frequency of Communication with Friends and Family: More than three times a week    Frequency  of Social Gatherings with Friends and Family: Three times a week    Attends Religious Services: 1 to 4 times per year    Active Member of Clubs or Organizations: No    Attends Engineer, structural: Never    Marital Status: Married   Family History  Problem Relation Age of Onset   Heart attack Mother 78       Died in [place]   Healthy Daughter    Healthy Son    Healthy Son    Healthy Daughter    Allergic rhinitis Neg Hx    Angioedema Neg Hx    Asthma Neg Hx    Eczema Neg Hx    Immunodeficiency Neg Hx    Urticaria Neg Hx    Allergies  Allergen Reactions   Penicillin G Hives   Lisinopril  Swelling     Tongue swelling     Positive ROS: All other systems have been reviewed and were otherwise negative with the exception of those mentioned in the HPI and as above.  Physical Exam: General: Alert, no acute distress Cardiovascular: No pedal edema Respiratory: No cyanosis, no use of accessory musculature Skin: No lesions in the area of chief complaint Neurologic: Sensation intact distally Psychiatric: Patient is competent for consent with normal mood and affect  MUSCULOSKELETAL:   RLE Visual examination limited by splint which is clean, dry, and intact No significant swelling above or below splint No traumatic wounds, ecchymosis, or rash of visible skin  Tenderness most over medial malleolus  No groin pain with log roll  No knee pain with ROM  Knee stable to varus/ valgus stress and without effusion/swelling  Sens DPN, SPN, TN intact  Motor EHL, ext, flex 5/5  Wiggles toes appropriately Skin warm and dry, no pallor or cyanosis, good perfusion of toes   IMAGING: seemingly small nondisplaced transverse oblique right distal tibial fracture through the malleolus.  Incidental noting of possible fusion of subtalar joint, profound bone density loss, and degenerative changes.  Assessment: Principal Problem:   Syncope, vasovagal Active Problems:   HTN (hypertension)   Thyroid  nodule   Dyslipidemia   Spleen laceration, sequela   Atrial fibrillation (HCC)   Nondisplaced fracture of medial malleolus of right tibia, initial encounter for closed fracture   Vasovagal syncope   Acute nondisplaced fracture of medial malleolus, right tibia, closed  Plan: Patient unfortunately has sustained what appears to be a nondisplaced fracture of the distal tibia.  Also known history of arthritis in right foot.  Discussed risks and benefits of conservative vs operative treatment at length.  Based on CT/X-rays, medical history, and exam findings, anticipate good chance of healing with non-operative  treatment.  Recommend continuation of splint and non-weight bearing of right leg for 2 weeks with follow up in the office for repeat x-rays and likely continuation of either splint or transition to boot.  Patient and family in agreement, no further questions.    [name] CHRISTELLA Mclean, PA-C  Contact information:   [phone] 7am-5pm epic message Dr. Edna, or call office for patient follow up: [phone] After hours and holidays please check [email or web address] for group call information for [facility]

## 2024-01-10 NOTE — Progress Notes (Signed)
 Plan of Care Note for accepted transfer   Patient: Manasa Spease MRN: [id number]   DOA: 01/10/2024  Facility requesting transfer: [facility]   Requesting Provider: Dr. Jerrol   Reason for transfer: Splenic laceration, possible syncope, new atrial fibrillation   Facility course: 89 yr old female with hx of HTN and HLD presents with right shoulder and right foot pain after a fall. She does not remember what happened.   She is found to be in atrial fibrillation with normal HR. Imaging reveals splenic laceration and suspected non-displaced intraarticular fracture or the right medial malleolus.   Dr. Ann of trauma surgery was consulted by the ED physician and has kindly placed a note with recommendations regarding the spleen laceration.   Plan of care: The patient is accepted for admission to Telemetry unit, at [facility].   Author: Evalene GORMAN Sprinkles, MD 01/10/2024  Check [email or web address] for on-call coverage.  Nursing staff, Please call [facility] Admits & Consults System-Wide number on Amion as soon as patient's arrival, so appropriate admitting provider can evaluate the pt.

## 2024-01-10 NOTE — Discharge Instructions (Signed)
 Orthopedic Discharge Instructions  Diet: As you were doing prior to hospitalization   Shower:  Since you have a splint on, leave this in place and keep the splint dry with a plastic bag.  If this gets wet, contact our office as you will likely need to have your splint changed.  Activity:  Limit your activities of your right leg and follow weight bearing restrictions below.  Do not drive for the next 6 weeks at least.  In addition, you cannot be taking narcotics while you drive, and you must feel in control of the vehicle.    Weight Bearing:   Do not bear weight on your injured (right) leg for at least 2-6 weeks, or until further instructed by your provider.    To prevent constipation: you may use a stool softener such as - Colace (over the counter) 100 mg by mouth twice a day  Drink plenty of fluids (prune juice may be helpful) and high fiber foods Miralax  (over the counter) for constipation as needed.    Itching:  If you experience itching with your medications, try taking only a single pain pill, or even half a pain pill at a time.  You may take up to 10 pain pills per day, and you can also use benadryl  over the counter for itching or also to help with sleep.   Precautions:  If you experience chest pain or shortness of breath - call 911 immediately for transfer to the hospital emergency department!!  Call office [phone]) for the following: Temperature greater than 101F Persistent nausea and vomiting Severe uncontrolled pain Redness, tenderness, or signs of infection (pain, swelling, redness, odor or green/yellow discharge around the site) Difficulty breathing, headache or visual disturbances Hives Persistent dizziness or light-headedness Extreme fatigue Any other questions or concerns you may have after discharge  In an emergency, call 911 or go to an Emergency Department at a nearby hospital  Follow- Up Appointment:  Please call for an appointment to be seen approximately 2  weeks in [facility] with your orthopedic surgeon Dr. Toribio Higashi - [phone] Address: [address], [place], [place] [address]

## 2024-01-10 NOTE — ED Notes (Signed)
 Pt placed on 2L O2. Her saturation was dropping to 86-89% after giving the Fentanyl . MD notified.

## 2024-01-10 NOTE — Consult Note (Addendum)
 Reason for Consult: fall, splenic laceration  Referring Physician: Dr. Evalene Granville, ER  [name] is an 89 y.o. female.   HPI: Patient is a 89 year old female admitted to the medical service for home.  Evaluation atrial fibrillation, right ankle fracture, splenic laceration.  Trauma surgery was asked to evaluate.  Pediatric surgery has already been consulted.  Patient speaks Spanish.  Daughter is at the bedside and acts as an interpreter and provides additional history.  Patient apparently fell approximately 8 PM at home.  She was able to ambulate following the fall.  She complained of right ankle pain.  Patient denies abdominal pain.  Patient was brought to the emergency department for relation.  She was noted to be in atrial fibrillation.  CT scans of the chest and pelvis were obtained as well as head and cervical spine.  Significant finding of a splenic laceration with small subcapsular hematoma without hemoperitoneum.  Plain x-rays included evaluation of ankle demonstrating a nondisplaced fracture.  Trauma surgery was contacted.  Patient was made NPO.  She is at bedrest.  Instructions were given to hold anticoagulation.  Past Medical History:  Diagnosis Date   Arthritis of right foot    Hx: UTI (urinary tract infection)    Hypertension    Osteoporosis    Palpitations    Unsteady gait     Past Surgical History:  Procedure Laterality Date   CATARACT EXTRACTION W/PHACO Right 01/01/2016   Procedure: CATARACT EXTRACTION PHACO AND INTRAOCULAR LENS PLACEMENT RIGHT EYE;  Surgeon: Oneil Platts, MD;  Location: [facility];  Service: Ophthalmology;  Laterality: Right;  CDE: 9.69   CATARACT EXTRACTION W/PHACO Left 01/29/2016   Procedure: CATARACT EXTRACTION PHACO AND INTRAOCULAR LENS PLACEMENT (IOC);  Surgeon: Oneil Platts, MD;  Location: [facility];  Service: Ophthalmology;  Laterality: Left;  CDE: 9.32   None      Family History  Problem Relation Age of Onset   Heart attack Mother 63        Died in [place]   Healthy Daughter    Healthy Son    Healthy Son    Healthy Daughter    Allergic rhinitis Neg Hx    Angioedema Neg Hx    Asthma Neg Hx    Eczema Neg Hx    Immunodeficiency Neg Hx    Urticaria Neg Hx     Social History:  reports that she has never smoked. She has never used smokeless tobacco. She reports that she does not drink alcohol and does not use drugs.  Allergies:  Allergies  Allergen Reactions   Penicillin G Hives   Lisinopril  Swelling    Tongue swelling    Medications: I have reviewed the patient's current medications.  Results for orders placed or performed during the hospital encounter of 01/10/24 (from the past 48 hours)  CBC with Differential     Status: Abnormal   Collection Time: 01/10/24  1:31 AM  Result Value Ref Range   WBC 11.8 (H) 4.0 - 10.5 K/uL   RBC 3.99 3.87 - 5.11 MIL/uL   Hemoglobin 11.2 (L) 12.0 - 15.0 g/dL   HCT 66.3 (L) 63.9 - 53.9 %   MCV 84.2 80.0 - 100.0 fL   MCH 28.1 26.0 - 34.0 pg   MCHC 33.3 30.0 - 36.0 g/dL   RDW 85.1 88.4 - 84.4 %   Platelets 302 150 - 400 K/uL   nRBC 0.0 0.0 - 0.2 %   Neutrophils Relative % 83 %  Neutro Abs 9.7 (H) 1.7 - 7.7 K/uL   Lymphocytes Relative 11 %   Lymphs Abs 1.4 0.7 - 4.0 K/uL   Monocytes Relative 6 %   Monocytes Absolute 0.7 0.1 - 1.0 K/uL   Eosinophils Relative 0 %   Eosinophils Absolute 0.0 0.0 - 0.5 K/uL   Basophils Relative 0 %   Basophils Absolute 0.0 0.0 - 0.1 K/uL   Immature Granulocytes 0 %   Abs Immature Granulocytes 0.04 0.00 - 0.07 K/uL    Comment: Performed at [facility], [address] [place], [place] [address]  Basic metabolic panel     Status: Abnormal   Collection Time: 01/10/24  1:31 AM  Result Value Ref Range   Sodium 134 (L) 135 - 145 mmol/L   Potassium 4.1 3.5 - 5.1 mmol/L   Chloride 99 98 - 111 mmol/L   CO2 21 (L) 22 - 32 mmol/L   Glucose, Bld 116 (H) 70 - 99 mg/dL    Comment: Glucose reference range applies only to samples  taken after fasting for at least 8 hours.   BUN 25 (H) 8 - 23 mg/dL   Creatinine, Ser 9.04 0.44 - 1.00 mg/dL   Calcium  9.0 8.9 - 10.3 mg/dL   GFR, Estimated 57 (L) >60 mL/min    Comment: (NOTE) Calculated using the CKD-EPI Creatinine Equation (2021)    Anion gap 15 5 - 15    Comment: Performed at [facility], [address], [place], [place] [address]  Troponin T, High Sensitivity     Status: None   Collection Time: 01/10/24  1:31 AM  Result Value Ref Range   Troponin T High Sensitivity <15 0 - 19 ng/L    Comment: (NOTE) Biotin concentrations > 1000 ng/mL falsely decrease TnT results.  Serial cardiac troponin measurements are suggested.  Refer to the Links section for chest pain algorithms and additional  guidance. Performed at [facility], [address], [place], [place] [address]   Pro Brain natriuretic peptide     Status: Abnormal   Collection Time: 01/10/24  1:31 AM  Result Value Ref Range   Pro Brain Natriuretic Peptide 1,457.0 (H) <300.0 pg/mL    Comment: (NOTE) Age Group        Cut-Points    Interpretation  < 50 years     450 pg/mL       NT-proBNP > 450 pg/mL indicates                                ADHF is likely              50 to 75 years  900 pg/mL      NT-proBNP > 900 pg/mL indicates          ADHF is likely  > 75 years      1800 pg/mL     NT-proBNP > 1800 pg/mL indicates          ADHF is likely                           All ages    Results between       Indeterminate. Further clinical             300 and the cut-   information is needed to determine            point  for age group   if ADHF is present.                                                             Elecsys proBNP II/ Elecsys proBNP II STAT           Cut-Point                       Interpretation  300 pg/mL                    NT-proBNP <300pg/mL indicates                             ADHF is not likely  Performed at [facility], [address], [place], [place] [address]   Hepatic function panel     Status: Abnormal   Collection Time: 01/10/24  1:31 AM  Result Value Ref Range   Total Protein 6.9 6.5 - 8.1 g/dL   Albumin 3.8 3.5 - 5.0 g/dL   AST 25 15 - 41 U/L   ALT 14 0 - 44 U/L   Alkaline Phosphatase 104 38 - 126 U/L   Total Bilirubin 0.6 0.0 - 1.2 mg/dL   Bilirubin, Direct 0.3 (H) 0.0 - 0.2 mg/dL   Indirect Bilirubin 0.4 0.3 - 0.9 mg/dL    Comment: Performed at [facility], [address], [place], [place] [address]  Troponin T, High Sensitivity     Status: None   Collection Time: 01/10/24  3:45 AM  Result Value Ref Range   Troponin T High Sensitivity <15 0 - 19 ng/L    Comment: (NOTE) Biotin concentrations > 1000 ng/mL falsely decrease TnT results.  Serial cardiac troponin measurements are suggested.  Refer to the Links section for chest pain algorithms and additional  guidance. Performed at [facility], [address] [place], [place] [address]     DG Foot Complete Right Result Date: 01/10/2024 CLINICAL DATA:  Unwitnessed fall. EXAM: RIGHT TIBIA AND FIBULA - 2 VIEW; RIGHT FOOT COMPLETE - 3+ VIEW COMPARISON:  Right ankle series 12/28/2019, right foot series 04/26/2018. FINDINGS: Right tibia fibula series, two views in 4 films: There is advanced bone density loss. 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 On the second image, there is a suspected transverse oblique intra-articular fracture through the medial malleolus of the distal tibia. There is no further evidence of fractures. There is moderate nonerosive tibiotalar arthrosis. Dorsal calcaneal spurring. Right foot, three views: The medial malleolar nondisplaced fracture is redemonstrated. This is best seen on the oblique view. There is profound bone density loss. No displaced fracture is seen in the foot. There is pes planus and moderate arthrosis in the midfoot, near bone-on-bone first MTP joint space loss, and  moderate hallux valgus. There are hammertoe deformities of second, third and fourth toes limiting assessment of the digits. There is generalized swelling in the foot, increased. There are vascular calcifications in the anterior and posterior distal foreleg. IMPRESSION: 1. Suspected nondisplaced transverse oblique intra-articular fracture through the medial malleolus of the distal tibia. 2. No further evidence of fractures in the right tibia fibula or right foot. 3. Degenerative changes and profound bone density loss. 4.  First MTP joint arthrosis and hallux valgus.  Pes planus. 5. Vascular calcifications. 6.  Soft tissue swelling. Electronically Signed   By: Francis Quam M.D.   On: 01/10/2024 03:49   DG Tibia/Fibula Right Result Date: 01/10/2024 CLINICAL DATA:  Unwitnessed fall. EXAM: RIGHT TIBIA AND FIBULA - 2 VIEW; RIGHT FOOT COMPLETE - 3+ VIEW COMPARISON:  Right ankle series 12/28/2019, right foot series 04/26/2018. FINDINGS: Right tibia fibula series, two views in 4 films: There is advanced bone density loss. 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 On the second image, there is a suspected transverse oblique intra-articular fracture through the medial malleolus of the distal tibia. There is no further evidence of fractures. There is moderate nonerosive tibiotalar arthrosis. Dorsal calcaneal spurring. Right foot, three views: The medial malleolar nondisplaced fracture is redemonstrated. This is best seen on the oblique view. There is profound bone density loss. No displaced fracture is seen in the foot. There is pes planus and moderate arthrosis in the midfoot, near bone-on-bone first MTP joint space loss, and moderate hallux valgus. There are hammertoe deformities of second, third and fourth toes limiting assessment of the digits. There is generalized swelling in the foot, increased. There are vascular calcifications in the anterior and posterior distal foreleg. IMPRESSION: 1. Suspected nondisplaced  transverse oblique intra-articular fracture through the medial malleolus of the distal tibia. 2. No further evidence of fractures in the right tibia fibula or right foot. 3. Degenerative changes and profound bone density loss. 4. First MTP joint arthrosis and hallux valgus.  Pes planus. 5. Vascular calcifications. 6.  Soft tissue swelling. Electronically Signed   By: Francis Quam M.D.   On: 01/10/2024 03:49   DG Shoulder Right Result Date: 01/10/2024 CLINICAL DATA:  Unwitnessed fall at home. EXAM: RIGHT SHOULDER - 2+ VIEW COMPARISON:  None. FINDINGS: Three views. Osteopenia. No evidence of fracture or dislocation. Mild nonerosive arthrosis noted of the right glenohumeral and AC joints, with spurring at the greater tuberosity. Soft tissues are unremarkable. IMPRESSION: Osteopenia and degenerative change without evidence of fractures. Electronically Signed   By: Francis Quam M.D.   On: 01/10/2024 03:37   CT CHEST ABDOMEN PELVIS W CONTRAST Result Date: 01/10/2024 CLINICAL DATA:  Unwitnessed fall at home with blunt polytrauma. EXAM: CT CHEST, ABDOMEN, AND PELVIS WITH CONTRAST TECHNIQUE: Multidetector CT imaging of the chest, abdomen and pelvis was performed following the standard protocol during bolus administration of intravenous contrast. RADIATION DOSE REDUCTION: This exam was performed according to the departmental dose-optimization program which includes automated exposure control, adjustment of the mA and/or kV according to patient size and/or use of iterative reconstruction technique. CONTRAST:  70mL OMNIPAQUE  IOHEXOL  300 MG/ML  SOLN COMPARISON:  Chest PA Lat 05/24/2015.  No other relevant prior. FINDINGS: CT CHEST FINDINGS Cardiovascular: There is mild cardiomegaly with biatrial chamber predominance. Scattered calcification in the mitral ring. There are moderate calcifications and thickening of the aortic valve leaflets. The thoracic aorta demonstrates moderate patchy calcifications, scattered  calcification in the great vessels, mixed plaques in the descending segment and no aneurysm, stenosis or dissection. There is a 1.1 cm rounded sessile focal soft plaque deposit along the left posterior proximal descending aortic wall. No penetrating ulcer. There is an enlarged pulmonary trunk 3.3 cm indicating arterial hypertension. No central embolus is seen. No venous dilatation. There is no pericardial effusion. There are left main and scattered two-vessel coronary artery calcifications in the LAD and right coronary artery. Mediastinum/Nodes: 1.6 cm heterogeneous solid nodule right lobe of the  thyroid  gland. This would ordinarily warrant a nonemergent ultrasound follow-up recommendation, but this should only be done if clinically warranted given her advanced age. Remaining thyroid  is unremarkable. There are no enlarged axillary lymph nodes. No intrathoracic adenopathy. Patulous but otherwise unremarkable esophagus, likely reflecting chronic dysmotility. The thoracic trachea and main bronchi are clear. Lungs/Pleura: There is mild diffuse bronchial thickening. There is posterior atelectasis in both lungs. Elevated right hemidiaphragm. There is a calcified right upper lobe granuloma laterally. There is no consolidation, nodules, effusion or pneumothorax. Musculoskeletal: Osteopenia with degenerative changes of the spine and mild kyphosis. 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 No regional skeletal fracture is seen. No chest wall mass. CT ABDOMEN PELVIS FINDINGS Hepatobiliary: There are occasional scattered tiny hypodensities in the hepatic substance which are too small to characterize. No follow-up imaging is needed in a low risk patient. There is no mass enhancement, laceration or perihepatic hematoma. Gallbladder and bile ducts are unremarkable. Pancreas: Partially atrophic.  No mass.  No ductal dilatation. Spleen: There is a 3 cm in length grade 2 linear laceration in the superior aspect of the spleen, extending from anterior  to posterior. There is a small subcapsular hematoma at the superior aspect of the laceration, measuring up to 2 cm. In the medial lower spleen there is a 1.5 cm cyst, Hounsfield density is 13. No other focal abnormality. Adrenals/Urinary Tract: Slight nodular thickening both adrenal glands. Cortical thinning noted in both kidneys with scattered tiny Bosniak 2 bilateral cortical cysts which are too small to characterize. No follow-up imaging is recommended. No adrenal or perirenal hemorrhage or renal laceration is seen. There is no urinary stone or obstruction. The bladder is contracted and not well seen but there are no inflammatory changes around it. The wall does appear more thickened than expected for contraction. Correlate clinically for underlying cystitis. There is pelvic floor laxity without evidence of cystocele. Stomach/Bowel: Negative gastric wall, unopacified small bowel and appendix. Diffuse colonic diverticulosis. There is faint interstitial haziness around the distal descending colon, which could be fat scarring from prior diverticular disease or could indicate a mild acute diverticulitis. There is no free air or abscess associated with this. Vascular/Lymphatic: Moderate to aortoiliac calcific plaque. No AAA. No dissection. Bilateral pelvic venous congestion extends to the outer uterine walls. No lymphadenopathy is seen. Reproductive: Uterus and bilateral adnexa are unremarkable. Other: No free hemorrhage, free ascites, or free air is seen. No incarcerated hernias. Musculoskeletal: There is a chronic mild upper plate L1 anterior wedge compression fracture deformity, which was seen on the prior PA and lateral chest and unchanged. Osteopenia and degenerative change of the spine is noted with advanced L5-S1 facet hypertrophy. No acute regional skeletal fracture. IMPRESSION: 1. 3 cm in length grade 2 linear laceration in the superior aspect of the spleen with a small subcapsular hematoma at the superior  aspect of the laceration. 2. No other acute trauma related findings in the chest, abdomen or pelvis. 3. Aortic and coronary artery atherosclerosis, including calcifications and thickening of the aortic valve leaflets. 4. Enlarged pulmonary trunk indicating arterial hypertension. 5. 1.6 cm heterogeneous solid nodule right lobe of the thyroid  gland. This would ordinarily warrant a nonemergent ultrasound follow-up recommendation, but this should only be done if clinically warranted given her advanced age. 6. Diffuse bronchial thickening consistent with bronchitis. 7. Cystitis versus bladder nondistention. 8. Diverticulosis with faint interstitial haziness around the distal descending colon, which could be fat scarring from prior diverticular disease or could indicate a mild acute diverticulitis. No  free air or abscess. 9. Scattered tiny hypodensities in the liver, too small to characterize with CT. No follow-up imaging is needed in a low risk patient. 10. Pelvic venous congestion. 11. Osteopenia and degenerative change. Chronic L1 compression fracture. 12. Critical Value/emergent results were called by telephone at the time of interpretation on 01/10/2024 at 3:26 am to provider [facility] , who verbally acknowledged these results. Aortic Atherosclerosis (ICD10-I70.0). Electronically Signed   By: Francis Quam M.D.   On: 01/10/2024 03:35   CT Head Wo Contrast Result Date: 01/10/2024 EXAM: CT HEAD, FACIAL BONES AND CERVICAL SPINE WITHOUT CONTRAST 01/10/2024 02:36:58 AM TECHNIQUE: CT of the head, facial bones and cervical spine was performed without the administration of intravenous contrast. Multiplanar reformatted images are provided for review. Automated exposure control, iterative reconstruction, and/or weight based adjustment of the mA/kV was utilized to reduce the radiation dose to as low as reasonably achievable. COMPARISON: MRI brain dated 02/21/2015. CLINICAL HISTORY: Fall. FINDINGS: CT HEAD BRAIN AND  VENTRICLES: Global cortical atrophy with prominent left extra-axial CSF space, chronic. No acute intracranial hemorrhage. No mass effect or midline shift. No extra-axial fluid collection. No evidence of acute infarct. No hydrocephalus. SKULL AND SCALP: No acute skull fracture. No scalp hematoma. CT FACIAL BONES FACIAL BONES: No acute facial fracture. No mandibular dislocation. No suspicious bone lesion. ORBITS: No acute traumatic injury. SINUSES AND MASTOIDS: No acute abnormality. SOFT TISSUES: No acute abnormality. CT CERVICAL SPINE BONES AND ALIGNMENT: No acute fracture or traumatic malalignment. DEGENERATIVE CHANGES: Mild degenerative changes at C3-4. SOFT TISSUES: No prevertebral soft tissue swelling. VASCULATURE: Intracranial atherosclerosis. CALCIFICATIONS: Bilateral basal ganglia calcifications. IMPRESSION: 1. No acute intracranial abnormality. 2. No evidence of maxillofacial fracture. 3. No acute traumatic injury to the cervical spine. Electronically signed by: Pinkie Pebbles MD 01/10/2024 02:52 AM EDT RP Workstation: [id number]   CT Cervical Spine Wo Contrast Result Date: 01/10/2024 EXAM: CT HEAD, FACIAL BONES AND CERVICAL SPINE WITHOUT CONTRAST 01/10/2024 02:36:58 AM TECHNIQUE: CT of the head, facial bones and cervical spine was performed without the administration of intravenous contrast. Multiplanar reformatted images are provided for review. Automated exposure control, iterative reconstruction, and/or weight based adjustment of the mA/kV was utilized to reduce the radiation dose to as low as reasonably achievable. COMPARISON: MRI brain dated 02/21/2015. CLINICAL HISTORY: Fall. FINDINGS: CT HEAD BRAIN AND VENTRICLES: Global cortical atrophy with prominent left extra-axial CSF space, chronic. No acute intracranial hemorrhage. No mass effect or midline shift. No extra-axial fluid collection. No evidence of acute infarct. No hydrocephalus. SKULL AND SCALP: No acute skull fracture. No scalp hematoma. CT  FACIAL BONES FACIAL BONES: No acute facial fracture. No mandibular dislocation. No suspicious bone lesion. ORBITS: No acute traumatic injury. SINUSES AND MASTOIDS: No acute abnormality. SOFT TISSUES: No acute abnormality. CT CERVICAL SPINE BONES AND ALIGNMENT: No acute fracture or traumatic malalignment. DEGENERATIVE CHANGES: Mild degenerative changes at C3-4. SOFT TISSUES: No prevertebral soft tissue swelling. VASCULATURE: Intracranial atherosclerosis. CALCIFICATIONS: Bilateral basal ganglia calcifications. IMPRESSION: 1. No acute intracranial abnormality. 2. No evidence of maxillofacial fracture. 3. No acute traumatic injury to the cervical spine. Electronically signed by: Pinkie Pebbles MD 01/10/2024 02:52 AM EDT RP Workstation: [id number]   CT Maxillofacial Wo Contrast Result Date: 01/10/2024 EXAM: CT HEAD, FACIAL BONES AND CERVICAL SPINE WITHOUT CONTRAST 01/10/2024 02:36:58 AM TECHNIQUE: CT of the head, facial bones and cervical spine was performed without the administration of intravenous contrast. Multiplanar reformatted images are provided for review. Automated exposure control, iterative reconstruction, and/or weight based adjustment of the  mA/kV was utilized to reduce the radiation dose to as low as reasonably achievable. COMPARISON: MRI brain dated 02/21/2015. CLINICAL HISTORY: Fall. FINDINGS: CT HEAD BRAIN AND VENTRICLES: Global cortical atrophy with prominent left extra-axial CSF space, chronic. No acute intracranial hemorrhage. No mass effect or midline shift. No extra-axial fluid collection. No evidence of acute infarct. No hydrocephalus. SKULL AND SCALP: No acute skull fracture. No scalp hematoma. CT FACIAL BONES FACIAL BONES: No acute facial fracture. No mandibular dislocation. No suspicious bone lesion. ORBITS: No acute traumatic injury. SINUSES AND MASTOIDS: No acute abnormality. SOFT TISSUES: No acute abnormality. CT CERVICAL SPINE BONES AND ALIGNMENT: No acute fracture or traumatic  malalignment. DEGENERATIVE CHANGES: Mild degenerative changes at C3-4. SOFT TISSUES: No prevertebral soft tissue swelling. VASCULATURE: Intracranial atherosclerosis. CALCIFICATIONS: Bilateral basal ganglia calcifications. IMPRESSION: 1. No acute intracranial abnormality. 2. No evidence of maxillofacial fracture. 3. No acute traumatic injury to the cervical spine. Electronically signed by: Pinkie Pebbles MD 01/10/2024 02:52 AM EDT RP Workstation: [id number]    Review of Systems  Constitutional: Negative.   HENT: Negative.    Eyes: Negative.   Respiratory: Negative.    Cardiovascular: Negative.   Gastrointestinal: Negative.   Endocrine: Negative.   Genitourinary: Negative.   Musculoskeletal:  Positive for arthralgias and joint swelling.  Skin: Negative.   Allergic/Immunologic: Negative.   Neurological:  Positive for light-headedness.  Hematological: Negative.   Psychiatric/Behavioral: Negative.      Blood pressure 132/62, pulse 64, temperature 98.1 F (36.7 C), temperature source Oral, resp. rate 18, height 4' 10.66 (1.49 m), weight 53.9 kg, SpO2 96%.  CONSTITUTIONAL: no acute distress; conversant; no obvious deformities  EYES: Conjunctiva clear and moist; pupils equal bilaterally  NECK: trachea midline; no thyroid  nodularity  Chest: No chest wall tenderness, no ecchymosis, no crepitance  GI: abdomen is soft without distension; no tenderness; no hepatosplenomegaly  MSK: Splint on right ankle  PSYCH: appropriate affect for situation; alert and oriented to person, place, & time  LYMPHATIC: no palpable cervical lymphadenopathy    Assessment/Plan:  Fall on level ground, syncopal episode - evaluation by medical service ongoing Right non-displace ankle fracture - splint in place - orthopedic consult seeing patient now, likely non-operative management Splenic laceration with subcapsular hematoma - CT scan reviewed; no hemoperitoneum - hold anticoagulation for  now - bedrest today - serial Hgb determinations Q6h today - allow clear liquid diet if no plans for OR per ortho Atrial fibrillation - per medical service - hold anticoagulation for 48 hours given splenic injury  Patient is seen and evaluated.  Family present at bedside.  Patient complains of right ankle pain.  Splenic laceration is noted on CT scan.  No sign of hemoperitoneum or hemodynamic instability.  Hemoglobin level 11.2 on admission.  Will keep patient at bed rest today.  Will ask medical service to hold off on any anticoagulation for 24 to 48 hours.  Will monitor hemoglobin 24 hours.  Trauma surgery will follow with you.  Krystal Spinner, MD [facility] practice Office: [phone]   Krystal Spinner 01/10/2024, 9:49 AM

## 2024-01-10 NOTE — ED Notes (Signed)
 Pt tolerating being off of O2 well. No signs of distress noted.

## 2024-01-10 NOTE — Consult Note (Signed)
 Called by [facility] regarding trauma consult. Patient is getting admitted to medicine for new onset Afib but found to have splenic laceration after fall at home with syncopal episode.  - We will consult when patient arrives at [facility] - Would not start anticoagulation for Afib given splenic laceration - Would hold DVT prophylaxis given splenic laceration - Bed rest - NPO - q6h H/H - Will discuss liberalizing once patient has had several stable H/H checks  Orie Silversmith, MD [facility]

## 2024-01-10 NOTE — ED Provider Notes (Signed)
 [facility] EMERGENCY DEPARTMENT AT [facility] Provider Note   CSN: [id number] Arrival date & time: 01/10/24  0112     Patient presents with: [name]   Taylr Meuth is a 89 y.o. female.    Fall     89 year old female presenting to the emergency department after a suspected syncopal episode.  The patient fell in the bathroom, had an unwitnessed fall, patient is amnestic to the event.  Patient struck her forehead in the bathroom, is not currently on anticoagulation, no known history of atrial fibrillation.  She complains of right shoulder pain, right ankle and foot pain that radiates up her tib/fib.  Additionally endorses diffuse myalgias.  Tdap is up todate.   She arrives GCS 15, ABC intact.  Prior to Admission medications   Medication Sig Start Date End Date Taking? Authorizing Provider  amLODipine  (NORVASC ) 5 MG tablet Take 1 tablet (5 mg total) by mouth daily. 01/23/23   Dettinger, Fonda LABOR, MD  atorvastatin  (LIPITOR) 80 MG tablet Take 1 tablet (80 mg total) by mouth daily. 07/24/23   Dettinger, Fonda LABOR, MD  diclofenac  (VOLTAREN ) 75 MG EC tablet Take 1 tablet (75 mg total) by mouth 2 (two) times daily. 07/24/23   Dettinger, Fonda LABOR, MD  loratadine  (CLARITIN ) 10 MG tablet Take 1 tablet (10 mg total) by mouth daily. 01/23/23   Dettinger, Fonda LABOR, MD  MYRBETRIQ  25 MG TB24 tablet Take 1 tablet (25 mg total) by mouth daily. 07/24/23   Dettinger, Fonda LABOR, MD  Pollen Extracts (PROSTAT PO) Take by mouth. Liquid prostat    [provider]    Allergies: Penicillin g and Lisinopril     Review of Systems  All other systems reviewed and are negative.   Updated Vital Signs BP 135/68   Pulse 88   Temp 99 F (37.2 C) (Oral)   Resp 18   SpO2 98%   Physical Exam Vitals and nursing note reviewed.  Constitutional:      General: She is not in acute distress.    Appearance: She is well-developed.     Comments: GCS 15, ABC intact  HENT:     Head: Normocephalic.      Comments: Hematoma/abrasion to right eyebrow, hemostatic Eyes:     Extraocular Movements: Extraocular movements intact.     Conjunctiva/sclera: Conjunctivae normal.     Pupils: Pupils are equal, round, and reactive to light.  Neck:     Comments: No midline tenderness to palpation of the cervical spine.  Range of motion intact Cardiovascular:     Rate and Rhythm: Normal rate and regular rhythm.     Heart sounds: No murmur heard.    Comments: Distal DP pulses intact bilaterally Pulmonary:     Effort: Pulmonary effort is normal. No respiratory distress.     Breath sounds: Normal breath sounds.  Chest:     Comments: Clavicles stable nontender to AP compression.  Chest wall stable with sternal TTP, bilateral rib ttp Abdominal:     Palpations: Abdomen is soft.     Tenderness: There is abdominal tenderness. There is no guarding.     Comments: Pelvis stable to lateral compression, Abd TTP  Musculoskeletal:     Cervical back: Neck supple.     Comments: No midline tenderness to palpation of the thoracic or lumbar spine.  Extremities atraumatic with intact range of motion with the exception of TTP of the right shoulder, right foot, and right ankle with associated ankle swelling  Skin:    General:  Skin is warm and dry.  Neurological:     Mental Status: She is alert.     Comments: Cranial nerves II through XII grossly intact.  Moving all 4 extremities spontaneously.  Sensation grossly intact all 4 extremities     (all labs ordered are listed, but only abnormal results are displayed) Labs Reviewed  CBC WITH DIFFERENTIAL/PLATELET - Abnormal; Notable for the following components:      Result Value   WBC 11.8 (*)    Hemoglobin 11.2 (*)    HCT 33.6 (*)    Neutro Abs 9.7 (*)    All other components within normal limits  BASIC METABOLIC PANEL WITH GFR - Abnormal; Notable for the following components:   Sodium 134 (*)    CO2 21 (*)    Glucose, Bld 116 (*)    BUN 25 (*)    GFR, Estimated 57  (*)    All other components within normal limits  PRO BRAIN NATRIURETIC PEPTIDE - Abnormal; Notable for the following components:   Pro Brain Natriuretic Peptide 1,457.0 (*)    All other components within normal limits  HEPATIC FUNCTION PANEL - Abnormal; Notable for the following components:   Bilirubin, Direct 0.3 (*)    All other components within normal limits  TROPONIN T, HIGH SENSITIVITY  TROPONIN T, HIGH SENSITIVITY    EKG: EKG Interpretation Date/Time:  Sunday January 10 2024 01:27:54 EDT Ventricular Rate:  69 PR Interval:    QRS Duration:  92 QT Interval:  418 QTC Calculation: 448 R Axis:   24  Text Interpretation: Atrial fibrillation Borderline repolarization abnormality Confirmed by Jerrol Agent ([id number]) on 01/10/2024 1:33:34 AM  Radiology: [facility] Foot Complete Right Result Date: 01/10/2024 CLINICAL DATA:  Unwitnessed fall. EXAM: RIGHT TIBIA AND FIBULA - 2 VIEW; RIGHT FOOT COMPLETE - 3+ VIEW COMPARISON:  Right ankle series 12/28/2019, right foot series 04/26/2018. FINDINGS: Right tibia fibula series, two views in 4 films: There is advanced bone density loss. 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 On the second image, there is a suspected transverse oblique intra-articular fracture through the medial malleolus of the distal tibia. There is no further evidence of fractures. There is moderate nonerosive tibiotalar arthrosis. Dorsal calcaneal spurring. Right foot, three views: The medial malleolar nondisplaced fracture is redemonstrated. This is best seen on the oblique view. There is profound bone density loss. No displaced fracture is seen in the foot. There is pes planus and moderate arthrosis in the midfoot, near bone-on-bone first MTP joint space loss, and moderate hallux valgus. There are hammertoe deformities of second, third and fourth toes limiting assessment of the digits. There is generalized swelling in the foot, increased. There are vascular calcifications in the  anterior and posterior distal foreleg. IMPRESSION: 1. Suspected nondisplaced transverse oblique intra-articular fracture through the medial malleolus of the distal tibia. 2. No further evidence of fractures in the right tibia fibula or right foot. 3. Degenerative changes and profound bone density loss. 4. First MTP joint arthrosis and hallux valgus.  Pes planus. 5. Vascular calcifications. 6.  Soft tissue swelling. Electronically Signed   By: Francis Quam M.D.   On: 01/10/2024 03:49   DG Tibia/Fibula Right Result Date: 01/10/2024 CLINICAL DATA:  Unwitnessed fall. EXAM: RIGHT TIBIA AND FIBULA - 2 VIEW; RIGHT FOOT COMPLETE - 3+ VIEW COMPARISON:  Right ankle series 12/28/2019, right foot series 04/26/2018. FINDINGS: Right tibia fibula series, two views in 4 films: There is advanced bone density loss. Old bone infarct distal right tibial  shaft. On the second image, there is a suspected transverse oblique intra-articular fracture through the medial malleolus of the distal tibia. There is no further evidence of fractures. There is moderate nonerosive tibiotalar arthrosis. Dorsal calcaneal spurring. Right foot, three views: The medial malleolar nondisplaced fracture is redemonstrated. This is best seen on the oblique view. There is profound bone density loss. No displaced fracture is seen in the foot. There is pes planus and moderate arthrosis in the midfoot, near bone-on-bone first MTP joint space loss, and moderate hallux valgus. There are hammertoe deformities of second, third and fourth toes limiting assessment of the digits. There is generalized swelling in the foot, increased. There are vascular calcifications in the anterior and posterior distal foreleg. IMPRESSION: 1. Suspected nondisplaced transverse oblique intra-articular fracture through the medial malleolus of the distal tibia. 2. No further evidence of fractures in the right tibia fibula or right foot. 3. Degenerative changes and profound bone density  loss. 4. First MTP joint arthrosis and hallux valgus.  Pes planus. 5. Vascular calcifications. 6.  Soft tissue swelling. Electronically Signed   By: Francis Quam M.D.   On: 01/10/2024 03:49   DG Shoulder Right Result Date: 01/10/2024 CLINICAL DATA:  Unwitnessed fall at home. EXAM: RIGHT SHOULDER - 2+ VIEW COMPARISON:  None. FINDINGS: Three views. Osteopenia. No evidence of fracture or dislocation. Mild nonerosive arthrosis noted of the right glenohumeral and AC joints, with spurring at the greater tuberosity. Soft tissues are unremarkable. IMPRESSION: Osteopenia and degenerative change without evidence of fractures. Electronically Signed   By: Francis Quam M.D.   On: 01/10/2024 03:37   CT CHEST ABDOMEN PELVIS W CONTRAST Result Date: 01/10/2024 CLINICAL DATA:  Unwitnessed fall at home with blunt polytrauma. EXAM: CT CHEST, ABDOMEN, AND PELVIS WITH CONTRAST TECHNIQUE: Multidetector CT imaging of the chest, abdomen and pelvis was performed following the standard protocol during bolus administration of intravenous contrast. RADIATION DOSE REDUCTION: This exam was performed according to the departmental dose-optimization program which includes automated exposure control, adjustment of the mA and/or kV according to patient size and/or use of iterative reconstruction technique. CONTRAST:  70mL OMNIPAQUE  IOHEXOL  300 MG/ML  SOLN COMPARISON:  Chest PA Lat 05/24/2015.  No other relevant prior. FINDINGS: CT CHEST FINDINGS Cardiovascular: There is mild cardiomegaly with biatrial chamber predominance. Scattered calcification in the mitral ring. There are moderate calcifications and thickening of the aortic valve leaflets. The thoracic aorta demonstrates moderate patchy calcifications, scattered calcification in the great vessels, mixed plaques in the descending segment and no aneurysm, stenosis or dissection. There is a 1.1 cm rounded sessile focal soft plaque deposit along the left posterior proximal descending aortic  wall. No penetrating ulcer. There is an enlarged pulmonary trunk 3.3 cm indicating arterial hypertension. No central embolus is seen. No venous dilatation. There is no pericardial effusion. There are left main and scattered two-vessel coronary artery calcifications in the LAD and right coronary artery. Mediastinum/Nodes: 1.6 cm heterogeneous solid nodule right lobe of the thyroid  gland. This would ordinarily warrant a nonemergent ultrasound follow-up recommendation, but this should only be done if clinically warranted given her advanced age. Remaining thyroid  is unremarkable. There are no enlarged axillary lymph nodes. No intrathoracic adenopathy. Patulous but otherwise unremarkable esophagus, likely reflecting chronic dysmotility. The thoracic trachea and main bronchi are clear. Lungs/Pleura: There is mild diffuse bronchial thickening. There is posterior atelectasis in both lungs. Elevated right hemidiaphragm. There is a calcified right upper lobe granuloma laterally. There is no consolidation, nodules, effusion or pneumothorax. Musculoskeletal: Osteopenia  with degenerative changes of the spine and mild kyphosis. 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 No regional skeletal fracture is seen. No chest wall mass. CT ABDOMEN PELVIS FINDINGS Hepatobiliary: There are occasional scattered tiny hypodensities in the hepatic substance which are too small to characterize. No follow-up imaging is needed in a low risk patient. There is no mass enhancement, laceration or perihepatic hematoma. Gallbladder and bile ducts are unremarkable. Pancreas: Partially atrophic.  No mass.  No ductal dilatation. Spleen: There is a 3 cm in length grade 2 linear laceration in the superior aspect of the spleen, extending from anterior to posterior. There is a small subcapsular hematoma at the superior aspect of the laceration, measuring up to 2 cm. In the medial lower spleen there is a 1.5 cm cyst, Hounsfield density is 13. No other focal abnormality.  Adrenals/Urinary Tract: Slight nodular thickening both adrenal glands. Cortical thinning noted in both kidneys with scattered tiny Bosniak 2 bilateral cortical cysts which are too small to characterize. No follow-up imaging is recommended. No adrenal or perirenal hemorrhage or renal laceration is seen. There is no urinary stone or obstruction. The bladder is contracted and not well seen but there are no inflammatory changes around it. The wall does appear more thickened than expected for contraction. Correlate clinically for underlying cystitis. There is pelvic floor laxity without evidence of cystocele. Stomach/Bowel: Negative gastric wall, unopacified small bowel and appendix. Diffuse colonic diverticulosis. There is faint interstitial haziness around the distal descending colon, which could be fat scarring from prior diverticular disease or could indicate a mild acute diverticulitis. There is no free air or abscess associated with this. Vascular/Lymphatic: Moderate to aortoiliac calcific plaque. No AAA. No dissection. Bilateral pelvic venous congestion extends to the outer uterine walls. No lymphadenopathy is seen. Reproductive: Uterus and bilateral adnexa are unremarkable. Other: No free hemorrhage, free ascites, or free air is seen. No incarcerated hernias. Musculoskeletal: There is a chronic mild upper plate L1 anterior wedge compression fracture deformity, which was seen on the prior PA and lateral chest and unchanged. Osteopenia and degenerative change of the spine is noted with advanced L5-S1 facet hypertrophy. No acute regional skeletal fracture. IMPRESSION: 1. 3 cm in length grade 2 linear laceration in the superior aspect of the spleen with a small subcapsular hematoma at the superior aspect of the laceration. 2. No other acute trauma related findings in the chest, abdomen or pelvis. 3. Aortic and coronary artery atherosclerosis, including calcifications and thickening of the aortic valve leaflets. 4.  Enlarged pulmonary trunk indicating arterial hypertension. 5. 1.6 cm heterogeneous solid nodule right lobe of the thyroid  gland. This would ordinarily warrant a nonemergent ultrasound follow-up recommendation, but this should only be done if clinically warranted given her advanced age. 6. Diffuse bronchial thickening consistent with bronchitis. 7. Cystitis versus bladder nondistention. 8. Diverticulosis with faint interstitial haziness around the distal descending colon, which could be fat scarring from prior diverticular disease or could indicate a mild acute diverticulitis. No free air or abscess. 9. Scattered tiny hypodensities in the liver, too small to characterize with CT. No follow-up imaging is needed in a low risk patient. 10. Pelvic venous congestion. 11. Osteopenia and degenerative change. Chronic L1 compression fracture. 12. Critical Value/emergent results were called by telephone at the time of interpretation on 01/10/2024 at 3:26 am to provider [facility] , who verbally acknowledged these results. Aortic Atherosclerosis (ICD10-I70.0). Electronically Signed   By: Francis Quam M.D.   On: 01/10/2024 03:35   CT Head Wo Contrast  Result Date: 01/10/2024 EXAM: CT HEAD, FACIAL BONES AND CERVICAL SPINE WITHOUT CONTRAST 01/10/2024 02:36:58 AM TECHNIQUE: CT of the head, facial bones and cervical spine was performed without the administration of intravenous contrast. Multiplanar reformatted images are provided for review. Automated exposure control, iterative reconstruction, and/or weight based adjustment of the mA/kV was utilized to reduce the radiation dose to as low as reasonably achievable. COMPARISON: MRI brain dated 02/21/2015. CLINICAL HISTORY: Fall. FINDINGS: CT HEAD BRAIN AND VENTRICLES: Global cortical atrophy with prominent left extra-axial CSF space, chronic. No acute intracranial hemorrhage. No mass effect or midline shift. No extra-axial fluid collection. No evidence of acute infarct. No  hydrocephalus. SKULL AND SCALP: No acute skull fracture. No scalp hematoma. CT FACIAL BONES FACIAL BONES: No acute facial fracture. No mandibular dislocation. No suspicious bone lesion. ORBITS: No acute traumatic injury. SINUSES AND MASTOIDS: No acute abnormality. SOFT TISSUES: No acute abnormality. CT CERVICAL SPINE BONES AND ALIGNMENT: No acute fracture or traumatic malalignment. DEGENERATIVE CHANGES: Mild degenerative changes at C3-4. SOFT TISSUES: No prevertebral soft tissue swelling. VASCULATURE: Intracranial atherosclerosis. CALCIFICATIONS: Bilateral basal ganglia calcifications. IMPRESSION: 1. No acute intracranial abnormality. 2. No evidence of maxillofacial fracture. 3. No acute traumatic injury to the cervical spine. Electronically signed by: Pinkie Pebbles MD 01/10/2024 02:52 AM EDT RP Workstation: [id number]   CT Cervical Spine Wo Contrast Result Date: 01/10/2024 EXAM: CT HEAD, FACIAL BONES AND CERVICAL SPINE WITHOUT CONTRAST 01/10/2024 02:36:58 AM TECHNIQUE: CT of the head, facial bones and cervical spine was performed without the administration of intravenous contrast. Multiplanar reformatted images are provided for review. Automated exposure control, iterative reconstruction, and/or weight based adjustment of the mA/kV was utilized to reduce the radiation dose to as low as reasonably achievable. COMPARISON: MRI brain dated 02/21/2015. CLINICAL HISTORY: Fall. FINDINGS: CT HEAD BRAIN AND VENTRICLES: Global cortical atrophy with prominent left extra-axial CSF space, chronic. No acute intracranial hemorrhage. No mass effect or midline shift. No extra-axial fluid collection. No evidence of acute infarct. No hydrocephalus. SKULL AND SCALP: No acute skull fracture. No scalp hematoma. CT FACIAL BONES FACIAL BONES: No acute facial fracture. No mandibular dislocation. No suspicious bone lesion. ORBITS: No acute traumatic injury. SINUSES AND MASTOIDS: No acute abnormality. SOFT TISSUES: No acute  abnormality. CT CERVICAL SPINE BONES AND ALIGNMENT: No acute fracture or traumatic malalignment. DEGENERATIVE CHANGES: Mild degenerative changes at C3-4. SOFT TISSUES: No prevertebral soft tissue swelling. VASCULATURE: Intracranial atherosclerosis. CALCIFICATIONS: Bilateral basal ganglia calcifications. IMPRESSION: 1. No acute intracranial abnormality. 2. No evidence of maxillofacial fracture. 3. No acute traumatic injury to the cervical spine. Electronically signed by: Pinkie Pebbles MD 01/10/2024 02:52 AM EDT RP Workstation: [id number]   CT Maxillofacial Wo Contrast Result Date: 01/10/2024 EXAM: CT HEAD, FACIAL BONES AND CERVICAL SPINE WITHOUT CONTRAST 01/10/2024 02:36:58 AM TECHNIQUE: CT of the head, facial bones and cervical spine was performed without the administration of intravenous contrast. Multiplanar reformatted images are provided for review. Automated exposure control, iterative reconstruction, and/or weight based adjustment of the mA/kV was utilized to reduce the radiation dose to as low as reasonably achievable. COMPARISON: MRI brain dated 02/21/2015. CLINICAL HISTORY: Fall. FINDINGS: CT HEAD BRAIN AND VENTRICLES: Global cortical atrophy with prominent left extra-axial CSF space, chronic. No acute intracranial hemorrhage. No mass effect or midline shift. No extra-axial fluid collection. No evidence of acute infarct. No hydrocephalus. SKULL AND SCALP: No acute skull fracture. No scalp hematoma. CT FACIAL BONES FACIAL BONES: No acute facial fracture. No mandibular dislocation. No suspicious bone lesion. ORBITS: No acute traumatic injury. SINUSES  AND MASTOIDS: No acute abnormality. SOFT TISSUES: No acute abnormality. CT CERVICAL SPINE BONES AND ALIGNMENT: No acute fracture or traumatic malalignment. DEGENERATIVE CHANGES: Mild degenerative changes at C3-4. SOFT TISSUES: No prevertebral soft tissue swelling. VASCULATURE: Intracranial atherosclerosis. CALCIFICATIONS: Bilateral basal ganglia  calcifications. IMPRESSION: 1. No acute intracranial abnormality. 2. No evidence of maxillofacial fracture. 3. No acute traumatic injury to the cervical spine. Electronically signed by: Pinkie Pebbles MD 01/10/2024 02:52 AM EDT RP Workstation: [id number]     Procedures   Medications Ordered in the ED  fentaNYL  (SUBLIMAZE ) injection 50 mcg (50 mcg Intravenous Given 01/10/24 0139)  iohexol  (OMNIPAQUE ) 300 MG/ML solution 100 mL (70 mLs Intravenous Contrast Given 01/10/24 0237)  fentaNYL  (SUBLIMAZE ) injection 50 mcg (50 mcg Intravenous Given 01/10/24 0353)                                    Medical Decision Making Amount and/or Complexity of Data Reviewed Labs: ordered. Radiology: ordered.  Risk Prescription drug management. Decision regarding hospitalization.    89 year old female presenting to the emergency department after a suspected syncopal episode.  The patient fell in the bathroom, had an unwitnessed fall, patient is amnestic to the event.  Patient struck her forehead in the bathroom, is not currently on anticoagulation, no known history of atrial fibrillation.  She complains of right shoulder pain, right ankle and foot pain that radiates up her tib/fib.  Additionally endorses diffuse myalgias.  Tdap is up todate.   She arrives GCS 15, ABC intact.  On arrival, the patient was afebrile, not tachycardic, hemodynamically stable, saturating well on room air.  On exam as per above, patient with bilateral ankle tenderness, abdominal tenderness, chest wall tenderness.  XR Right Shoulder: IMPRESSION:  Osteopenia and degenerative change without evidence of fractures.   XR R Tib/Fib and Foot: IMPRESSION:  1. Suspected nondisplaced transverse oblique intra-articular  fracture through the medial malleolus of the distal tibia.  2. No further evidence of fractures in the right tibia fibula or  right foot.  3. Degenerative changes and profound bone density loss.  4. First MTP joint  arthrosis and hallux valgus.  Pes planus.  5. Vascular calcifications.  6.  Soft tissue swelling.    CT Head Face and C Spine: IMPRESSION:  1. No acute intracranial abnormality.  2. No evidence of maxillofacial fracture.  3. No acute traumatic injury to the cervical spine.    CT C/A/P; IMPRESSION:  1. 3 cm in length grade 2 linear laceration in the superior aspect  of the spleen with a small subcapsular hematoma at the superior  aspect of the laceration.  2. No other acute trauma related findings in the chest, abdomen or  pelvis.  3. Aortic and coronary artery atherosclerosis, including  calcifications and thickening of the aortic valve leaflets.  4. Enlarged pulmonary trunk indicating arterial hypertension.  5. 1.6 cm heterogeneous solid nodule right lobe of the thyroid   gland. This would ordinarily warrant a nonemergent ultrasound  follow-up recommendation, but this should only be done if clinically  warranted given her advanced age.  6. Diffuse bronchial thickening consistent with bronchitis.  7. Cystitis versus bladder nondistention.  8. Diverticulosis with faint interstitial haziness around the distal  descending colon, which could be fat scarring from prior  diverticular disease or could indicate a mild acute diverticulitis.  No free air or abscess.  9. Scattered tiny hypodensities in the liver, too small to  characterize with CT. No follow-up imaging is needed in a low risk  patient.  10. Pelvic venous congestion.  11. Osteopenia and degenerative change. Chronic L1 compression  fracture.  12. Critical Value/emergent results were called by telephone at the  time of interpretation on 01/10/2024 at 3:26 am to provider [facility] , who verbally acknowledged these results.    Aortic Atherosclerosis (ICD10-I70.0).    Trauma consult: Discussed with Dr. Ann, will see in consultation. Agreed hold AC at this time and hold prophylaxis in the setting of splenic lac. Bed  rest advised.   CT Ankle and Foot:  Pending  Labs: CBC with a nonspecific leukocytosis in the setting of trauma to 11.8, hemoglobin 11.2, BMP without AKI, mild hyponatremia to 134, hepatic function panel generally unremarkable, cardiac troponin normal, pro-BNP normal after adjustment for age.  Patient administered fentanyl  for pain control.  Medicine admission: Spoke with Dr. Charlton who agreed to admit the patient. Family and patient updated regarding the plan of care. Dr. Edna of Ortho consulted and will evaluate the patient. Posterior slab with stirrup splint ordered.      Final diagnoses:  Syncope, unspecified syncope type  Laceration of spleen, initial encounter  Atrial fibrillation, unspecified type [facility])  Closed fracture of right ankle, initial encounter    ED Discharge Orders     None          Jerrol Agent, MD 01/10/24 0500

## 2024-01-10 NOTE — H&P (Signed)
 History and Physical    [name] FMW:[id number] DOB: [birth year] DOA: 01/10/2024  PCP: Dettinger, Fonda LABOR, MD  Patient coming from: Home with family  Chief Complaint: Syncope  HPI: [name] is a 89 y.o. female with medical history significant of HTN, HLD who presents after syncopal episode. Patient is Spanish speaking and her family are at bedside to translate.  Patient was in her usual state of health.  She woke up in the middle of the night to use the restroom, after urinating, she got up and felt dizzy and passed out.  At that time, her spouse found her on the floor.  Patient had no postictal symptoms, was not confused.  She complained of pain in her right ankle.  She presented to the emergency department at that time.  Family notes that patient has had poor oral intake in the past week.  No other acute illnesses recently.  In the emergency department, imaging revealed unremarkable CT head, cervical spine but did find 3 cm in length grade 2 laceration of spleen with small subscapular hematoma.  Also found suspected nondisplaced fracture of right medial malleolus of distal tibia.  She was also found to be in new onset A-fib, rate controlled in the 60s to 70s.  Patient was admitted for further care.  Trauma surgery as well as orthopedic surgery were consulted.  Review of Systems: As per HPI. Otherwise, all other review of systems reviewed and are negative.   Past Medical History:  Diagnosis Date   Arthritis of right foot    Hx: UTI (urinary tract infection)    Hypertension    Osteoporosis    Palpitations    Unsteady gait     Past Surgical History:  Procedure Laterality Date   CATARACT EXTRACTION W/PHACO Right 01/01/2016   Procedure: CATARACT EXTRACTION PHACO AND INTRAOCULAR LENS PLACEMENT RIGHT EYE;  Surgeon: Oneil Platts, MD;  Location: [facility];  Service: Ophthalmology;  Laterality: Right;  CDE: 9.69   CATARACT EXTRACTION W/PHACO Left 01/29/2016   Procedure: CATARACT  EXTRACTION PHACO AND INTRAOCULAR LENS PLACEMENT (IOC);  Surgeon: Oneil Platts, MD;  Location: [facility];  Service: Ophthalmology;  Laterality: Left;  CDE: 9.32   None       reports that she has never smoked. She has never used smokeless tobacco. She reports that she does not drink alcohol and does not use drugs.  Allergies  Allergen Reactions   Penicillin G Hives   Lisinopril  Swelling    Tongue swelling    Family History  Problem Relation Age of Onset   Heart attack Mother 69       Died in [place]   Healthy Daughter    Healthy Son    Healthy Son    Healthy Daughter    Allergic rhinitis Neg Hx    Angioedema Neg Hx    Asthma Neg Hx    Eczema Neg Hx    Immunodeficiency Neg Hx    Urticaria Neg Hx     Prior to Admission medications   Medication Sig Start Date End Date Taking? Authorizing Provider  amLODipine  (NORVASC ) 5 MG tablet Take 1 tablet (5 mg total) by mouth daily. 01/23/23   Dettinger, Fonda LABOR, MD  atorvastatin  (LIPITOR) 80 MG tablet Take 1 tablet (80 mg total) by mouth daily. 07/24/23   Dettinger, Fonda LABOR, MD  diclofenac  (VOLTAREN ) 75 MG EC tablet Take 1 tablet (75 mg total) by mouth 2 (two) times daily. 07/24/23   Dettinger, Fonda LABOR, MD  loratadine  (CLARITIN )  10 MG tablet Take 1 tablet (10 mg total) by mouth daily. 01/23/23   Dettinger, Fonda LABOR, MD  MYRBETRIQ  25 MG TB24 tablet Take 1 tablet (25 mg total) by mouth daily. 07/24/23   Dettinger, Fonda LABOR, MD  Pollen Extracts (PROSTAT PO) Take by mouth. Liquid prostat    [provider]    Physical Exam: Vitals:   01/10/24 0445 01/10/24 0500 01/10/24 0545 01/10/24 0631  BP: 127/82 121/61 121/68 123/68  Pulse: 70 69 74 (!) 59  Resp: 17 (!) 21 (!) 23 20  Temp:    98.3 F (36.8 C)  TempSrc:    Oral  SpO2: 99% 95% 97% 97%  Weight:    53.9 kg  Height:    4' 10.66 (1.49 m)     Constitutional: NAD, calm, comfortable Eyes: PERRL, lids and conjunctivae normal Respiratory: Clear to auscultation bilaterally, no  wheezing, no crackles. Normal respiratory effort. No accessory muscle use.  Cardiovascular: Irregular rhythm, rate 60s, +2 systolic murmur Abdomen: Soft, nondistended, nontender to palpation. Bowel sounds positive.  Musculoskeletal: o contractures. Normal muscle tone.  Skin: no rashes, lesions, ulcers on exposed skin  Neurologic: Alert   Labs on Admission: I have personally reviewed following labs and imaging studies  CBC: Recent Labs  Lab 01/10/24 0131  WBC 11.8*  NEUTROABS 9.7*  HGB 11.2*  HCT 33.6*  MCV 84.2  PLT 302   Basic Metabolic Panel: Recent Labs  Lab 01/10/24 0131  NA 134*  K 4.1  CL 99  CO2 21*  GLUCOSE 116*  BUN 25*  CREATININE 0.95  CALCIUM  9.0   GFR: Estimated Creatinine Clearance: 30.4 mL/min (by C-G formula based on SCr of 0.95 mg/dL). Liver Function Tests: Recent Labs  Lab 01/10/24 0131  AST 25  ALT 14  ALKPHOS 104  BILITOT 0.6  PROT 6.9  ALBUMIN 3.8   No results for input(s): LIPASE, AMYLASE in the last 168 hours. No results for input(s): AMMONIA in the last 168 hours. Coagulation Profile: No results for input(s): INR, PROTIME in the last 168 hours. Cardiac Enzymes: No results for input(s): CKTOTAL, CKMB, CKMBINDEX, TROPONINI in the last 168 hours. BNP (last 3 results) Recent Labs    01/10/24 0131  PROBNP 1,457.0*   HbA1C: No results for input(s): HGBA1C in the last 72 hours. CBG: No results for input(s): GLUCAP in the last 168 hours. Lipid Profile: No results for input(s): CHOL, HDL, LDLCALC, TRIG, CHOLHDL, LDLDIRECT in the last 72 hours. Thyroid  Function Tests: No results for input(s): TSH, T4TOTAL, FREET4, T3FREE, THYROIDAB in the last 72 hours. Anemia Panel: No results for input(s): VITAMINB12, FOLATE, FERRITIN, TIBC, IRON, RETICCTPCT in the last 72 hours. Urine analysis:    Component Value Date/Time   COLORURINE YELLOW 04/26/2018 1005   APPEARANCEUR CLEAR 04/26/2018  1005   LABSPEC 1.023 04/26/2018 1005   PHURINE < OR = 5.0 04/26/2018 1005   GLUCOSEU NEGATIVE 04/26/2018 1005   HGBUR NEGATIVE 04/26/2018 1005   BILIRUBINUR NEGATIVE 02/21/2015 1640   BILIRUBINUR neg 12/08/2014 1120   KETONESUR NEGATIVE 04/26/2018 1005   PROTEINUR NEGATIVE 04/26/2018 1005   UROBILINOGEN 1.0 02/21/2015 1640   NITRITE NEGATIVE 04/26/2018 1005   LEUKOCYTESUR 1+ (A) 04/26/2018 1005   Sepsis Labs: !!!!!!!!!!!!!!!!!!!!!!!!!!!!!!!!!!!!!!!!!!!! @LABRCNTIP (procalcitonin:4,lacticidven:4) )No results found for this or any previous visit (from the past 240 hours).   Radiological Exams on Admission: DG Foot Complete Right Result Date: 01/10/2024 CLINICAL DATA:  Unwitnessed fall. EXAM: RIGHT TIBIA AND FIBULA - 2 VIEW; RIGHT FOOT COMPLETE - 3+  VIEW COMPARISON:  Right ankle series 12/28/2019, right foot series 04/26/2018. FINDINGS: Right tibia fibula series, two views in 4 films: There is advanced bone density loss. 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 On the second image, there is a suspected transverse oblique intra-articular fracture through the medial malleolus of the distal tibia. There is no further evidence of fractures. There is moderate nonerosive tibiotalar arthrosis. Dorsal calcaneal spurring. Right foot, three views: The medial malleolar nondisplaced fracture is redemonstrated. This is best seen on the oblique view. There is profound bone density loss. No displaced fracture is seen in the foot. There is pes planus and moderate arthrosis in the midfoot, near bone-on-bone first MTP joint space loss, and moderate hallux valgus. There are hammertoe deformities of second, third and fourth toes limiting assessment of the digits. There is generalized swelling in the foot, increased. There are vascular calcifications in the anterior and posterior distal foreleg. IMPRESSION: 1. Suspected nondisplaced transverse oblique intra-articular fracture through the medial malleolus of the distal  tibia. 2. No further evidence of fractures in the right tibia fibula or right foot. 3. Degenerative changes and profound bone density loss. 4. First MTP joint arthrosis and hallux valgus.  Pes planus. 5. Vascular calcifications. 6.  Soft tissue swelling. Electronically Signed   By: Francis Quam M.D.   On: 01/10/2024 03:49   DG Tibia/Fibula Right Result Date: 01/10/2024 CLINICAL DATA:  Unwitnessed fall. EXAM: RIGHT TIBIA AND FIBULA - 2 VIEW; RIGHT FOOT COMPLETE - 3+ VIEW COMPARISON:  Right ankle series 12/28/2019, right foot series 04/26/2018. FINDINGS: Right tibia fibula series, two views in 4 films: There is advanced bone density loss. 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 On the second image, there is a suspected transverse oblique intra-articular fracture through the medial malleolus of the distal tibia. There is no further evidence of fractures. There is moderate nonerosive tibiotalar arthrosis. Dorsal calcaneal spurring. Right foot, three views: The medial malleolar nondisplaced fracture is redemonstrated. This is best seen on the oblique view. There is profound bone density loss. No displaced fracture is seen in the foot. There is pes planus and moderate arthrosis in the midfoot, near bone-on-bone first MTP joint space loss, and moderate hallux valgus. There are hammertoe deformities of second, third and fourth toes limiting assessment of the digits. There is generalized swelling in the foot, increased. There are vascular calcifications in the anterior and posterior distal foreleg. IMPRESSION: 1. Suspected nondisplaced transverse oblique intra-articular fracture through the medial malleolus of the distal tibia. 2. No further evidence of fractures in the right tibia fibula or right foot. 3. Degenerative changes and profound bone density loss. 4. First MTP joint arthrosis and hallux valgus.  Pes planus. 5. Vascular calcifications. 6.  Soft tissue swelling. Electronically Signed   By: Francis Quam  M.D.   On: 01/10/2024 03:49   DG Shoulder Right Result Date: 01/10/2024 CLINICAL DATA:  Unwitnessed fall at home. EXAM: RIGHT SHOULDER - 2+ VIEW COMPARISON:  None. FINDINGS: Three views. Osteopenia. No evidence of fracture or dislocation. Mild nonerosive arthrosis noted of the right glenohumeral and AC joints, with spurring at the greater tuberosity. Soft tissues are unremarkable. IMPRESSION: Osteopenia and degenerative change without evidence of fractures. Electronically Signed   By: Francis Quam M.D.   On: 01/10/2024 03:37   CT CHEST ABDOMEN PELVIS W CONTRAST Result Date: 01/10/2024 CLINICAL DATA:  Unwitnessed fall at home with blunt polytrauma. EXAM: CT CHEST, ABDOMEN, AND PELVIS WITH CONTRAST TECHNIQUE: Multidetector CT imaging of the chest,  abdomen and pelvis was performed following the standard protocol during bolus administration of intravenous contrast. RADIATION DOSE REDUCTION: This exam was performed according to the departmental dose-optimization program which includes automated exposure control, adjustment of the mA and/or kV according to patient size and/or use of iterative reconstruction technique. CONTRAST:  70mL OMNIPAQUE  IOHEXOL  300 MG/ML  SOLN COMPARISON:  Chest PA Lat 05/24/2015.  No other relevant prior. FINDINGS: CT CHEST FINDINGS Cardiovascular: There is mild cardiomegaly with biatrial chamber predominance. Scattered calcification in the mitral ring. There are moderate calcifications and thickening of the aortic valve leaflets. The thoracic aorta demonstrates moderate patchy calcifications, scattered calcification in the great vessels, mixed plaques in the descending segment and no aneurysm, stenosis or dissection. There is a 1.1 cm rounded sessile focal soft plaque deposit along the left posterior proximal descending aortic wall. No penetrating ulcer. There is an enlarged pulmonary trunk 3.3 cm indicating arterial hypertension. No central embolus is seen. No venous dilatation. There is  no pericardial effusion. There are left main and scattered two-vessel coronary artery calcifications in the LAD and right coronary artery. Mediastinum/Nodes: 1.6 cm heterogeneous solid nodule right lobe of the thyroid  gland. This would ordinarily warrant a nonemergent ultrasound follow-up recommendation, but this should only be done if clinically warranted given her advanced age. Remaining thyroid  is unremarkable. There are no enlarged axillary lymph nodes. No intrathoracic adenopathy. Patulous but otherwise unremarkable esophagus, likely reflecting chronic dysmotility. The thoracic trachea and main bronchi are clear. Lungs/Pleura: There is mild diffuse bronchial thickening. There is posterior atelectasis in both lungs. Elevated right hemidiaphragm. There is a calcified right upper lobe granuloma laterally. There is no consolidation, nodules, effusion or pneumothorax. Musculoskeletal: Osteopenia with degenerative changes of the spine and mild kyphosis. 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 No regional skeletal fracture is seen. No chest wall mass. CT ABDOMEN PELVIS FINDINGS Hepatobiliary: There are occasional scattered tiny hypodensities in the hepatic substance which are too small to characterize. No follow-up imaging is needed in a low risk patient. There is no mass enhancement, laceration or perihepatic hematoma. Gallbladder and bile ducts are unremarkable. Pancreas: Partially atrophic.  No mass.  No ductal dilatation. Spleen: There is a 3 cm in length grade 2 linear laceration in the superior aspect of the spleen, extending from anterior to posterior. There is a small subcapsular hematoma at the superior aspect of the laceration, measuring up to 2 cm. In the medial lower spleen there is a 1.5 cm cyst, Hounsfield density is 13. No other focal abnormality. Adrenals/Urinary Tract: Slight nodular thickening both adrenal glands. Cortical thinning noted in both kidneys with scattered tiny Bosniak 2 bilateral cortical cysts which  are too small to characterize. No follow-up imaging is recommended. No adrenal or perirenal hemorrhage or renal laceration is seen. There is no urinary stone or obstruction. The bladder is contracted and not well seen but there are no inflammatory changes around it. The wall does appear more thickened than expected for contraction. Correlate clinically for underlying cystitis. There is pelvic floor laxity without evidence of cystocele. Stomach/Bowel: Negative gastric wall, unopacified small bowel and appendix. Diffuse colonic diverticulosis. There is faint interstitial haziness around the distal descending colon, which could be fat scarring from prior diverticular disease or could indicate a mild acute diverticulitis. There is no free air or abscess associated with this. Vascular/Lymphatic: Moderate to aortoiliac calcific plaque. No AAA. No dissection. Bilateral pelvic venous congestion extends to the outer uterine walls. No lymphadenopathy is seen. Reproductive: Uterus and bilateral adnexa are unremarkable.  Other: No free hemorrhage, free ascites, or free air is seen. No incarcerated hernias. Musculoskeletal: There is a chronic mild upper plate L1 anterior wedge compression fracture deformity, which was seen on the prior PA and lateral chest and unchanged. Osteopenia and degenerative change of the spine is noted with advanced L5-S1 facet hypertrophy. No acute regional skeletal fracture. IMPRESSION: 1. 3 cm in length grade 2 linear laceration in the superior aspect of the spleen with a small subcapsular hematoma at the superior aspect of the laceration. 2. No other acute trauma related findings in the chest, abdomen or pelvis. 3. Aortic and coronary artery atherosclerosis, including calcifications and thickening of the aortic valve leaflets. 4. Enlarged pulmonary trunk indicating arterial hypertension. 5. 1.6 cm heterogeneous solid nodule right lobe of the thyroid  gland. This would ordinarily warrant a nonemergent  ultrasound follow-up recommendation, but this should only be done if clinically warranted given her advanced age. 6. Diffuse bronchial thickening consistent with bronchitis. 7. Cystitis versus bladder nondistention. 8. Diverticulosis with faint interstitial haziness around the distal descending colon, which could be fat scarring from prior diverticular disease or could indicate a mild acute diverticulitis. No free air or abscess. 9. Scattered tiny hypodensities in the liver, too small to characterize with CT. No follow-up imaging is needed in a low risk patient. 10. Pelvic venous congestion. 11. Osteopenia and degenerative change. Chronic L1 compression fracture. 12. Critical Value/emergent results were called by telephone at the time of interpretation on 01/10/2024 at 3:26 am to provider [facility] [place] [facility] who verbally acknowledged these results. Aortic Atherosclerosis (ICD10-I70.0). Electronically Signed   By: Francis Quam M.D.   On: 01/10/2024 03:35   CT Head Wo Contrast Result Date: 01/10/2024 EXAM: CT HEAD, FACIAL BONES AND CERVICAL SPINE WITHOUT CONTRAST 01/10/2024 02:36:58 AM TECHNIQUE: CT of the head, facial bones and cervical spine was performed without the administration of intravenous contrast. Multiplanar reformatted images are provided for review. Automated exposure control, iterative reconstruction, and/or weight based adjustment of the mA/kV was utilized to reduce the radiation dose to as low as reasonably achievable. COMPARISON: MRI brain dated 02/21/2015. CLINICAL HISTORY: Fall. FINDINGS: CT HEAD BRAIN AND VENTRICLES: Global cortical atrophy with prominent left extra-axial CSF space, chronic. No acute intracranial hemorrhage. No mass effect or midline shift. No extra-axial fluid collection. No evidence of acute infarct. No hydrocephalus. SKULL AND SCALP: No acute skull fracture. No scalp hematoma. CT FACIAL BONES FACIAL BONES: No acute facial fracture. No mandibular dislocation. No suspicious bone  lesion. ORBITS: No acute traumatic injury. SINUSES AND MASTOIDS: No acute abnormality. SOFT TISSUES: No acute abnormality. CT CERVICAL SPINE BONES AND ALIGNMENT: No acute fracture or traumatic malalignment. DEGENERATIVE CHANGES: Mild degenerative changes at C3-4. SOFT TISSUES: No prevertebral soft tissue swelling. VASCULATURE: Intracranial atherosclerosis. CALCIFICATIONS: Bilateral basal ganglia calcifications. IMPRESSION: 1. No acute intracranial abnormality. 2. No evidence of maxillofacial fracture. 3. No acute traumatic injury to the cervical spine. Electronically signed by: Pinkie Pebbles MD 01/10/2024 02:52 AM EDT RP Workstation: [id number]   CT Cervical Spine Wo Contrast Result Date: 01/10/2024 EXAM: CT HEAD, FACIAL BONES AND CERVICAL SPINE WITHOUT CONTRAST 01/10/2024 02:36:58 AM TECHNIQUE: CT of the head, facial bones and cervical spine was performed without the administration of intravenous contrast. Multiplanar reformatted images are provided for review. Automated exposure control, iterative reconstruction, and/or weight based adjustment of the mA/kV was utilized to reduce the radiation dose to as low as reasonably achievable. COMPARISON: MRI brain dated 02/21/2015. CLINICAL HISTORY: Fall. FINDINGS: CT HEAD BRAIN AND VENTRICLES: Global cortical  atrophy with prominent left extra-axial CSF space, chronic. No acute intracranial hemorrhage. No mass effect or midline shift. No extra-axial fluid collection. No evidence of acute infarct. No hydrocephalus. SKULL AND SCALP: No acute skull fracture. No scalp hematoma. CT FACIAL BONES FACIAL BONES: No acute facial fracture. No mandibular dislocation. No suspicious bone lesion. ORBITS: No acute traumatic injury. SINUSES AND MASTOIDS: No acute abnormality. SOFT TISSUES: No acute abnormality. CT CERVICAL SPINE BONES AND ALIGNMENT: No acute fracture or traumatic malalignment. DEGENERATIVE CHANGES: Mild degenerative changes at C3-4. SOFT TISSUES: No prevertebral soft  tissue swelling. VASCULATURE: Intracranial atherosclerosis. CALCIFICATIONS: Bilateral basal ganglia calcifications. IMPRESSION: 1. No acute intracranial abnormality. 2. No evidence of maxillofacial fracture. 3. No acute traumatic injury to the cervical spine. Electronically signed by: Pinkie Pebbles MD 01/10/2024 02:52 AM EDT RP Workstation: [id number]   CT Maxillofacial Wo Contrast Result Date: 01/10/2024 EXAM: CT HEAD, FACIAL BONES AND CERVICAL SPINE WITHOUT CONTRAST 01/10/2024 02:36:58 AM TECHNIQUE: CT of the head, facial bones and cervical spine was performed without the administration of intravenous contrast. Multiplanar reformatted images are provided for review. Automated exposure control, iterative reconstruction, and/or weight based adjustment of the mA/kV was utilized to reduce the radiation dose to as low as reasonably achievable. COMPARISON: MRI brain dated 02/21/2015. CLINICAL HISTORY: Fall. FINDINGS: CT HEAD BRAIN AND VENTRICLES: Global cortical atrophy with prominent left extra-axial CSF space, chronic. No acute intracranial hemorrhage. No mass effect or midline shift. No extra-axial fluid collection. No evidence of acute infarct. No hydrocephalus. SKULL AND SCALP: No acute skull fracture. No scalp hematoma. CT FACIAL BONES FACIAL BONES: No acute facial fracture. No mandibular dislocation. No suspicious bone lesion. ORBITS: No acute traumatic injury. SINUSES AND MASTOIDS: No acute abnormality. SOFT TISSUES: No acute abnormality. CT CERVICAL SPINE BONES AND ALIGNMENT: No acute fracture or traumatic malalignment. DEGENERATIVE CHANGES: Mild degenerative changes at C3-4. SOFT TISSUES: No prevertebral soft tissue swelling. VASCULATURE: Intracranial atherosclerosis. CALCIFICATIONS: Bilateral basal ganglia calcifications. IMPRESSION: 1. No acute intracranial abnormality. 2. No evidence of maxillofacial fracture. 3. No acute traumatic injury to the cervical spine. Electronically signed by: Pinkie Pebbles MD 01/10/2024 02:52 AM EDT RP Workstation: [id number]    EKG: Independently reviewed.  A-fib rate 69  Assessment/Plan Principal Problem:   Syncope, vasovagal Active Problems:   HTN (hypertension)   Thyroid  nodule   Dyslipidemia   Spleen laceration, sequela   Atrial fibrillation (HCC)   Nondisplaced fracture of medial malleolus of right tibia, initial encounter for closed fracture   Vasovagal syncope   Vasovagal syncope - Insetting of poor oral intake in the last week.  Patient had dizziness upon standing after using the restroom, followed by syncopal episode - Start IV fluid, currently n.p.o. until further evaluation from surgery teams - Hold Norvasc   - Check orthostatic VS   New onset A-fib - Rate controlled - Echocardiogram was recently completed 01/06/2024 which revealed EF 55 to 60%, normal diastolic parameter, mild to moderate aortic stenosis, mild mitral regurgitation - Continue telemetry   Splenic laceration - Trauma surgery consulted - NPO until eval by surgery   Right medial malleolus fracture - CT pending - Orthopedic surgery consulted - Pain control - Bedrest until eval by surgery   Incidental finding on CT -  1.6 cm heterogeneous solid nodule right lobe of the thyroid  gland. This would ordinarily warrant a nonemergent ultrasound follow-up recommendation, but this should only be done if clinically warranted given her advanced age. -  Diverticulosis with faint interstitial haziness around the distal descending colon, which  could be fat scarring from prior diverticular disease or could indicate a mild acute diverticulitis. No free air or abscess.  (Patient has no symptoms to correlate with diverticulitis, such as abdominal pain, fever, BM changes)   HTN - Hold Norvasc    HLD - Lipitor    DVT prophylaxis: SCD in setting of splenic laceration Code Status: Full code, confirmed with family at bedside Family Communication: Daughter and son at  bedside Disposition Plan: Home Consults called: Trauma surgery, orthopedic surgery   Severity of Illness: The appropriate patient status for this patient is INPATIENT. Inpatient status is judged to be reasonable and necessary in order to provide the required intensity of service to ensure the patient's safety. The patient's presenting symptoms, physical exam findings, and initial radiographic and laboratory data in the context of their chronic comorbidities is felt to place them at high risk for further clinical deterioration. Furthermore, it is not anticipated that the patient will be medically stable for discharge from the hospital within 2 midnights of admission.   * I certify that at the point of admission it is my clinical judgment that the patient will require inpatient hospital care spanning beyond 2 midnights from the point of admission due to high intensity of service, high risk for further deterioration and high frequency of surveillance required.DEWAINE Delon Hoe, DO [facility] 01/10/2024, 8:08 AM   Available via Epic secure chat 7am-7pm After these hours, please refer to coverage provider listed on [email or web address]

## 2024-01-10 NOTE — ED Triage Notes (Signed)
 Pt presents via POV c/o fall at home. C/o right shoulder and right foot pain. Family reports pt felt dizzy prior to fall. Family reports fall was unwitnessed. Denies anticoagulation.

## 2024-01-11 ENCOUNTER — Other Ambulatory Visit (HOSPITAL_COMMUNITY): Payer: Self-pay

## 2024-01-11 ENCOUNTER — Inpatient Hospital Stay (HOSPITAL_COMMUNITY)

## 2024-01-11 ENCOUNTER — Encounter (HOSPITAL_COMMUNITY): Payer: Self-pay | Admitting: Internal Medicine

## 2024-01-11 DIAGNOSIS — E7800 Pure hypercholesterolemia, unspecified: Secondary | ICD-10-CM

## 2024-01-11 DIAGNOSIS — I350 Nonrheumatic aortic (valve) stenosis: Secondary | ICD-10-CM

## 2024-01-11 DIAGNOSIS — R55 Syncope and collapse: Secondary | ICD-10-CM | POA: Diagnosis not present

## 2024-01-11 DIAGNOSIS — I4819 Other persistent atrial fibrillation: Secondary | ICD-10-CM | POA: Diagnosis not present

## 2024-01-11 LAB — COMPREHENSIVE METABOLIC PANEL WITH GFR
ALT: 13 U/L (ref 0–44)
AST: 18 U/L (ref 15–41)
Albumin: 2.6 g/dL — ABNORMAL LOW (ref 3.5–5.0)
Alkaline Phosphatase: 66 U/L (ref 38–126)
Anion gap: 9 (ref 5–15)
BUN: 8 mg/dL (ref 8–23)
CO2: 20 mmol/L — ABNORMAL LOW (ref 22–32)
Calcium: 7.7 mg/dL — ABNORMAL LOW (ref 8.9–10.3)
Chloride: 103 mmol/L (ref 98–111)
Creatinine, Ser: 0.7 mg/dL (ref 0.44–1.00)
GFR, Estimated: >60 mL/min (ref >60)
Glucose, Bld: 92 mg/dL (ref 70–99)
Potassium: 3.8 mmol/L (ref 3.5–5.1)
Sodium: 132 mmol/L — ABNORMAL LOW (ref 135–145)
Total Bilirubin: 0.9 mg/dL (ref 0.0–1.2)
Total Protein: 5.7 g/dL — ABNORMAL LOW (ref 6.5–8.1)

## 2024-01-11 LAB — CBC
HCT: 29.5 % — ABNORMAL LOW (ref 36.0–46.0)
HCT: 32.1 % — ABNORMAL LOW (ref 36.0–46.0)
Hemoglobin: 9.7 g/dL — ABNORMAL LOW (ref 12.0–15.0)
Hemoglobin: 10.5 g/dL — ABNORMAL LOW (ref 12.0–15.0)
MCH: 27.6 pg (ref 26.0–34.0)
MCH: 27.6 pg (ref 26.0–34.0)
MCHC: 32.9 g/dL (ref 30.0–36.0)
MCHC: 32.7 g/dL (ref 30.0–36.0)
MCV: 84 fL (ref 80.0–100.0)
MCV: 84.3 fL (ref 80.0–100.0)
Platelets: 226 K/uL (ref 150–400)
Platelets: 257 K/uL (ref 150–400)
RBC: 3.51 MIL/uL — ABNORMAL LOW (ref 3.87–5.11)
RBC: 3.81 MIL/uL — ABNORMAL LOW (ref 3.87–5.11)
RDW: 14.9 % (ref 11.5–15.5)
RDW: 14.8 % (ref 11.5–15.5)
WBC: 7 K/uL (ref 4.0–10.5)
WBC: 7.4 K/uL (ref 4.0–10.5)
nRBC: 0 % (ref 0.0–0.2)
nRBC: 0 % (ref 0.0–0.2)

## 2024-01-11 LAB — PROTIME-INR
INR: 1.2 (ref 0.8–1.2)
Prothrombin Time: 16.2 s — ABNORMAL HIGH (ref 11.4–15.2)

## 2024-01-11 MED ORDER — ACETAMINOPHEN 500 MG PO TABS
1000.0000 mg | ORAL_TABLET | Freq: Three times a day (TID) | ORAL | Status: DC
  Administered 2024-01-11 – 2024-01-14 (×9): 1000 mg via ORAL
  Filled 2024-01-11 (×9): qty 2

## 2024-01-11 MED ORDER — OXYCODONE HCL 5 MG PO TABS: 5.0000 mg | ORAL_TABLET | Freq: Four times a day (QID) | ORAL | Status: DC | PRN

## 2024-01-11 MED ORDER — OXYCODONE HCL 5 MG PO TABS: 10.0000 mg | ORAL_TABLET | Freq: Four times a day (QID) | ORAL | Status: DC | PRN

## 2024-01-11 MED ORDER — TRAMADOL HCL 50 MG PO TABS
25.0000 mg | ORAL_TABLET | ORAL | Status: DC | PRN
  Administered 2024-01-11 – 2024-01-14 (×3): 50 mg via ORAL
  Filled 2024-01-11 (×4): qty 1

## 2024-01-11 MED ORDER — FENTANYL CITRATE PF 50 MCG/ML IJ SOSY
25.0000 ug | PREFILLED_SYRINGE | INTRAMUSCULAR | Status: DC | PRN
Start: 2024-01-11 — End: 2024-01-14

## 2024-01-11 NOTE — Plan of Care (Signed)
  Problem: Education: Goal: Knowledge of General Education information will improve Description: Including pain rating scale, medication(s)/side effects and non-pharmacologic comfort measures 01/11/2024 0235 by Billee Marybelle Ragena SHAUNNA, RN Outcome: Progressing 01/11/2024 0213 by Billee Marybelle Ragena SHAUNNA, RN Outcome: Progressing   Problem: Health Behavior/Discharge Planning: Goal: Ability to manage health-related needs will improve 01/11/2024 0235 by Billee Marybelle Ragena SHAUNNA, RN Outcome: Progressing 01/11/2024 0213 by Billee Marybelle Ragena SHAUNNA, RN Outcome: Progressing   Problem: Clinical Measurements: Goal: Ability to maintain clinical measurements within normal limits will improve 01/11/2024 0235 by Billee Marybelle Ragena SHAUNNA, RN Outcome: Progressing 01/11/2024 0213 by Billee Marybelle Ragena SHAUNNA, RN Outcome: Progressing Goal: Will remain free from infection 01/11/2024 0235 by Billee Marybelle Ragena SHAUNNA, RN Outcome: Progressing 01/11/2024 0213 by Billee Marybelle Ragena SHAUNNA, RN Outcome: Progressing Goal: Diagnostic test results will improve 01/11/2024 0235 by Billee Marybelle Ragena SHAUNNA, RN Outcome: Progressing 01/11/2024 0213 by Billee Marybelle Ragena SHAUNNA, RN Outcome: Progressing Goal: Respiratory complications will improve 01/11/2024 0235 by Billee Marybelle Ragena SHAUNNA, RN Outcome: Progressing 01/11/2024 0213 by Billee Marybelle Ragena SHAUNNA, RN Outcome: Progressing Goal: Cardiovascular complication will be avoided 01/11/2024 0235 by Billee Marybelle Ragena SHAUNNA, RN Outcome: Progressing 01/11/2024 0213 by Billee Marybelle Ragena SHAUNNA, RN Outcome: Progressing   Problem: Activity: Goal: Risk for activity intolerance will decrease 01/11/2024 0235 by Billee Marybelle Ragena SHAUNNA, RN Outcome: Progressing 01/11/2024 0213 by Billee Marybelle Ragena SHAUNNA, RN Outcome: Progressing   Problem: Nutrition: Goal: Adequate nutrition will be maintained 01/11/2024 0235 by Billee Marybelle Ragena SHAUNNA, RN Outcome:  Progressing 01/11/2024 0213 by Billee Marybelle Ragena SHAUNNA, RN Outcome: Progressing   Problem: Coping: Goal: Level of anxiety will decrease 01/11/2024 0235 by Billee Marybelle Ragena SHAUNNA, RN Outcome: Progressing 01/11/2024 0213 by Billee Marybelle Ragena SHAUNNA, RN Outcome: Progressing   Problem: Elimination: Goal: Will not experience complications related to bowel motility 01/11/2024 0235 by Billee Marybelle Ragena SHAUNNA, RN Outcome: Progressing 01/11/2024 0213 by Billee Marybelle Ragena SHAUNNA, RN Outcome: Progressing Goal: Will not experience complications related to urinary retention 01/11/2024 0235 by Billee Marybelle Ragena SHAUNNA, RN Outcome: Progressing 01/11/2024 0213 by Billee Marybelle Ragena SHAUNNA, RN Outcome: Progressing   Problem: Pain Managment: Goal: General experience of comfort will improve and/or be controlled 01/11/2024 0235 by Billee Marybelle Ragena SHAUNNA, RN Outcome: Progressing 01/11/2024 0213 by Billee Marybelle Ragena SHAUNNA, RN Outcome: Progressing   Problem: Safety: Goal: Ability to remain free from injury will improve 01/11/2024 0235 by Billee Marybelle Ragena SHAUNNA, RN Outcome: Progressing 01/11/2024 0213 by Billee Marybelle Ragena SHAUNNA, RN Outcome: Progressing   Problem: Skin Integrity: Goal: Risk for impaired skin integrity will decrease 01/11/2024 0235 by Billee Marybelle Ragena SHAUNNA, RN Outcome: Progressing 01/11/2024 0213 by Billee Marybelle Ragena SHAUNNA, RN Outcome: Progressing

## 2024-01-11 NOTE — Consult Note (Addendum)
 Cardiology Consultation   Patient ID: Jakeline Dave MRN: [id number]; DOB: 1932/09/22  Admit date: 01/10/2024 Date of Consult: 01/11/2024  PCP:  Dettinger, Fonda LABOR, MD   [facility] Providers Cardiologist:  New Click here to update MD or APP on Care Team, Refresh:1}     Patient Profile: Faatima Tench is a 89 y.o. female with a hx of HTN, heart murmur, arthritis, UTI who is being seen 01/11/2024 for the evaluation of possible syncope and atrial fibrillation at the request of Dr. Tobie.  History of Present Illness:  Ms. Kuck remotely saw Dr. Lavona in 2016 for dyspnea and palpitations. Monitor was ordered but never completed. 2D echo showed EF 60-65% with moderate LVH. PCP also previously noted a murmur as well.  She was admitted with fall in the bathroom. Dr. Francyne spoke Spanish with the patient and also discussed with her son. The patient adamantly reports she did not pass out though family said they thought she might have, though fall was unwitnessed. Her husband found her in the bathroom awake when he checked on her after he heard her fall. She struck her forehead. For the past 2-3 days she had noticed some palpitations but no recent chest pain or SOB. Presenting BP 109/65. Trauma workup showed splenic laceration and right medial malleolus fracture. She was also noted to be in newly recognized rate controlled AF. She was seen by surgery who recommended against anticoagulation due to splenic injury. She has not required surgical intervention. Echo showed EF 55-60%, mildly reduced RV function, mild MR, findings most c/w mild-moderate AS, suspected bicuspid AV (previously reported tricuspid in 2016). She has no acute complaints today. Trops neg x2, pBNP 1457, BUN 25, Cr wnl, mild leukocytosis of 11.8, mild anemia of Hgb 11.2 downtrending to 9.7, albumin 2.6. TSH ordered for AM.  Looking back her family now recognizes she has a history of having spontaneous dizzy spells. These  have not been exertional in nature.   Past Medical History:  Diagnosis Date   Arthritis of right foot    Hx: UTI (urinary tract infection)    Hypertension    Osteoporosis    Palpitations    Unsteady gait     Past Surgical History:  Procedure Laterality Date   CATARACT EXTRACTION W/PHACO Right 01/01/2016   Procedure: CATARACT EXTRACTION PHACO AND INTRAOCULAR LENS PLACEMENT RIGHT EYE;  Surgeon: Oneil Platts, MD;  Location: [facility];  Service: Ophthalmology;  Laterality: Right;  CDE: 9.69   CATARACT EXTRACTION W/PHACO Left 01/29/2016   Procedure: CATARACT EXTRACTION PHACO AND INTRAOCULAR LENS PLACEMENT (IOC);  Surgeon: Oneil Platts, MD;  Location: [facility];  Service: Ophthalmology;  Laterality: Left;  CDE: 9.32   None       Home Medications:  Prior to Admission medications   Medication Sig Start Date End Date Taking? Authorizing Provider  amLODipine  (NORVASC ) 5 MG tablet Take 1 tablet (5 mg total) by mouth daily. 01/23/23  Yes Dettinger, Fonda LABOR, MD  atorvastatin  (LIPITOR) 80 MG tablet Take 1 tablet (80 mg total) by mouth daily. 07/24/23  Yes Dettinger, Fonda LABOR, MD  diclofenac  (VOLTAREN ) 75 MG EC tablet Take 1 tablet (75 mg total) by mouth 2 (two) times daily. 07/24/23  Yes Dettinger, Fonda LABOR, MD  loratadine  (CLARITIN ) 10 MG tablet Take 1 tablet (10 mg total) by mouth daily. 01/23/23  Yes Dettinger, Fonda LABOR, MD  MYRBETRIQ  25 MG TB24 tablet Take 1 tablet (25 mg total) by mouth daily. 07/24/23  Yes Dettinger, Fonda LABOR, MD  HYDROcodone -acetaminophen  (NORCO/VICODIN) 5-325 MG tablet Take 1-2 tablets by mouth every 6 (six) hours as needed for moderate pain (pain score 4-6) or severe pain (pain score 7-10). 01/10/24   Cockerham, Alicia M, PA-C    Scheduled Meds:  acetaminophen   1,000 mg Oral Q8H   atorvastatin   80 mg Oral Daily   Continuous Infusions:  PRN Meds: fentaNYL  (SUBLIMAZE ) injection, ondansetron  **OR** ondansetron  (ZOFRAN ) IV, polyethylene glycol, traMADol   Allergies:    Allergies   Allergen Reactions   Penicillin G Hives   Lisinopril  Swelling    Tongue swelling    Social History:   Social History   Socioeconomic History   Marital status: Married    Spouse name: [name]   Number of children: 4   Years of education: Not on file   Highest education level: 3rd grade  Occupational History   Occupation: Homemaker  Tobacco Use   Smoking status: Never   Smokeless tobacco: Never  Vaping Use   Vaping status: Never Used  Substance and Sexual Activity   Alcohol use: No    Alcohol/week: 0.0 standard drinks of alcohol   Drug use: Never   Sexual activity: Not on file  Other Topics Concern   Not on file  Social History Narrative   Lives at home with son  and sister in law and husband.     1 cup coffee per day.   Right-handed.   Social Drivers of Corporate investment banker Strain: Low Risk  (06/12/2023)   Overall Financial Resource Strain (CARDIA)    Difficulty of Paying Living Expenses: Not hard at all  Food Insecurity: No Food Insecurity (01/11/2024)   Hunger Vital Sign    Worried About Running Out of Food in the Last Year: Never true    Ran Out of Food in the Last Year: Never true  Transportation Needs: No Transportation Needs (01/11/2024)   PRAPARE - Administrator, Civil Service (Medical): No    Lack of Transportation (Non-Medical): No  Physical Activity: Insufficiently Active (06/12/2023)   Exercise Vital Sign    Days of Exercise per Week: 3 days    Minutes of Exercise per Session: 30 min  Stress: No Stress Concern Present (06/12/2023)   [employer] of Occupational Health - Occupational Stress Questionnaire    Feeling of Stress : Not at all  Social Connections: Moderately Integrated (01/11/2024)   Social Connection and Isolation Panel    Frequency of Communication with Friends and Family: More than three times a week    Frequency of Social Gatherings with Friends and Family: Three times a week    Attends Religious Services: 1 to 4  times per year    Active Member of Clubs or Organizations: No    Attends Banker Meetings: Never    Marital Status: Married  Catering manager Violence: Not At Risk (01/11/2024)   Humiliation, Afraid, Rape, and Kick questionnaire    Fear of Current or Ex-Partner: No    Emotionally Abused: No    Physically Abused: No    Sexually Abused: No    Family History:    Family History  Problem Relation Age of Onset   Heart attack Mother 33       Died in [place]   Healthy Daughter    Healthy Daughter    Healthy Son    Healthy Son    Allergic rhinitis Neg Hx    Angioedema Neg Hx    Asthma Neg Hx    Eczema  Neg Hx    Immunodeficiency Neg Hx    Urticaria Neg Hx    Stroke Neg Hx      ROS:  Please see the history of present illness.  All other ROS reviewed and negative.     Physical Exam/Data: Vitals:   01/10/24 1900 01/11/24 0409 01/11/24 0732 01/11/24 1423  BP: 122/67 (!) 113/57 (!) 113/59 (!) 117/53  Pulse: 63 93 74 75  Resp: 16  17 16   Temp: 98 F (36.7 C) 98.5 F (36.9 C) 97.6 F (36.4 C) 98.5 F (36.9 C)  TempSrc:   Oral Oral  SpO2: 95% 92% 96% 95%  Weight:      Height:        Intake/Output Summary (Last 24 hours) at 01/11/2024 1832 Last data filed at 01/11/2024 1300 Gross per 24 hour  Intake 321.25 ml  Output --  Net 321.25 ml      01/10/2024    6:31 AM 12/31/2023    9:58 AM 12/24/2023    4:22 PM  Last 3 Weights  Weight (lbs) 118 lb 13.3 oz 121 lb 120 lb  Weight (kg) 53.9 kg 54.885 kg 54.432 kg     Body mass index is 24.28 kg/m. Examined by MD. General: Well developed, well nourished, in no acute distress. Head: Normocephalic, atraumatic, sclera non-icteric, no xanthomas, nares are without discharge. Neck: Negative for carotid bruits. JVP not elevated. Lungs: Clear bilaterally to auscultation without wheezes, rales, or rhonchi. Breathing is unlabored. Heart: Irregularly irregular, + SEM no rubs or gallops.  Abdomen: Soft, non-tender, non-distended  with normoactive bowel sounds. No rebound/guarding. Extremities: No clubbing or cyanosis. No edema. Distal pedal pulses are 2+ and equal bilaterally. Neuro: Alert and oriented X 3. Moves all extremities spontaneously. Psych:  Responds to questions appropriately with a normal affect.   EKG:  The EKG was personally reviewed and demonstrates:  Afib 61bpm, baseline artifact, nonspecific STTW changes Telemetry:  Telemetry was personally reviewed and demonstrates:  AF rate controlled no bradycardia seen  Relevant CV Studies: 2d echo 01/06/24   1. Left ventricular ejection fraction, by estimation, is 55 to 60%. The  left ventricle has normal function. The left ventricle has no regional  wall motion abnormalities. Left ventricular diastolic parameters were  normal.   2. Right ventricular systolic function is mildly reduced. The right  ventricular size is normal. Tricuspid regurgitation signal is inadequate  for assessing PA pressure.   3. The mitral valve is degenerative. Mild mitral valve regurgitation. No  evidence of mitral stenosis.   4. The aortic valve is bicuspid. Aortic valve regurgitation is trivial.  Moderate aortic valve stenosis. Aortic valve area, by VTI measures 1.06  cm. Aortic valve mean gradient measures 8.7 mmHg. Aortic valve Vmax  measures 2.19 m/s. Although the mean AVG   and Vmax are c/w no stenosis, the DI is reduced at 0.34 and SVI low at  33. Findings most consistent with mild to moderate low flow low gradient  aortic stenosis. Visually there is reduced leaflet excursion with probable  bicuspid AV.   5. Aortic dilatation noted. There is mild dilatation of the ascending  aorta, measuring 38 mm.   6. The inferior vena cava is normal in size with greater than 50%  respiratory variability, suggesting right atrial pressure of 3 mmHg.   Laboratory Data: High Sensitivity Troponin:  No results for input(s): TROPONINIHS in the last 720 hours.   Chemistry Recent Labs   Lab 01/10/24 0131 01/11/24 0654  NA 134* 132*  K 4.1 3.8  CL 99 103  CO2 21* 20*  GLUCOSE 116* 92  BUN 25* 8  CREATININE 0.95 0.70  CALCIUM  9.0 7.7*  GFRNONAA 57* >60  ANIONGAP 15 9    Recent Labs  Lab 01/10/24 0131 01/11/24 0654  PROT 6.9 5.7*  ALBUMIN 3.8 2.6*  AST 25 18  ALT 14 13  ALKPHOS 104 66  BILITOT 0.6 0.9   Lipids No results for input(s): CHOL, TRIG, HDL, LABVLDL, LDLCALC, CHOLHDL in the last 168 hours.  Hematology Recent Labs  Lab 01/10/24 0131 01/10/24 1033 01/10/24 1619 01/10/24 2248 01/11/24 0654  WBC 11.8*  --   --   --  7.0  RBC 3.99  --   --   --  3.51*  HGB 11.2*   < > 10.0* 9.7* 9.7*  HCT 33.6*   < > 29.8* 28.7* 29.5*  MCV 84.2  --   --   --  84.0  MCH 28.1  --   --   --  27.6  MCHC 33.3  --   --   --  32.9  RDW 14.8  --   --   --  14.9  PLT 302  --   --   --  226   < > = values in this interval not displayed.   Thyroid  No results for input(s): TSH, FREET4 in the last 168 hours.  BNP Recent Labs  Lab 01/10/24 0131  PROBNP 1,457.0*    DDimer No results for input(s): DDIMER in the last 168 hours.  Radiology/Studies:  CT Foot Right Wo Contrast Result Date: 01/11/2024 CLINICAL DATA:  Foot trauma, occult fracture suspected. EXAM: CT OF THE RIGHT FOREFOOT WITHOUT CONTRAST TECHNIQUE: Multidetector CT imaging of the right forefoot was performed according to the standard protocol. Multiplanar CT image reconstructions were also generated. RADIATION DOSE REDUCTION: This exam was performed according to the departmental dose-optimization program which includes automated exposure control, adjustment of the mA and/or kV according to patient size and/or use of iterative reconstruction technique. COMPARISON:  Radiographs 01/10/2024 and 12/24/2023. FINDINGS: Bones/Joint/Cartilage Ankle and hindfoot findings are dictated separately. Examination limited by osseous demineralization and hammertoe deformities. There is posttraumatic deformity  of the 1st metatarsal neck. No definite acute fracture or dislocation identified within the forefoot or midfoot. Ankle fracture details are dictated separately. Mild forefoot degenerative changes, greatest at the 1st metatarsophalangeal joint. Ligaments Suboptimally assessed by CT. Muscles and Tendons Generalized muscular atrophy. No focal intramuscular fluid collection or tendon rupture identified. Soft tissues Nonspecific mild to moderate dorsal subcutaneous edema. No focal fluid collection, foreign body or soft tissue emphysema identified. Scattered vascular calcifications. IMPRESSION: 1. No definite acute fracture or dislocation identified within the forefoot or midfoot. 2. Ankle and hindfoot findings are dictated separately. 3. Nonspecific dorsal subcutaneous edema. No focal fluid collection, foreign body or soft tissue emphysema identified. Electronically Signed   By: Elsie Perone M.D.   On: 01/11/2024 09:31   CT Ankle Right Wo Contrast Result Date: 01/11/2024 CLINICAL DATA:  Ankle trauma, fracture seen on x-ray. EXAM: CT OF THE RIGHT ANKLE WITHOUT CONTRAST TECHNIQUE: Multidetector CT imaging of the right ankle was performed according to the standard protocol. Multiplanar CT image reconstructions were also generated. RADIATION DOSE REDUCTION: This exam was performed according to the departmental dose-optimization program which includes automated exposure control, adjustment of the mA and/or kV according to patient size and/or use of iterative reconstruction technique. COMPARISON:  Radiographs 01/10/2024 and 12/24/2023. FINDINGS: Bones/Joint/Cartilage Forefoot findings are dictated separately.  The bones are diffusely demineralized. There is a nondisplaced bimalleolar fracture at the ankle. Mildly comminuted oblique fracture through the base of the medial malleolus demonstrates no significant involvement of the weight-bearing articular surface. There is an oblique, nondisplaced fracture through distal  fibular diaphysis. In addition, there is a small acute appearing avulsion fracture along the anterior aspect of the lateral malleolus. The posterior malleolus appears intact. Underlying mild tibiotalar degenerative changes and small ankle joint effusion. Unchanged, nonaggressive appearing chondroid lesion in the distal tibial diaphysis. No acute osseous findings are identified within the hindfoot or midfoot. Subtalar ankylosis noted. Ligaments Suboptimally assessed by CT. Muscles and Tendons As evaluated by CT, the ankle tendons appear intact, without entrapment in the fractures. Generalized muscular atrophy without apparent focal abnormality. Soft tissues Mild soft tissue swelling around the ankle. No focal fluid collection, foreign body or soft tissue emphysema. There are scattered vascular calcifications. IMPRESSION: 1. Nondisplaced bimalleolar fracture at the ankle as described. 2. No other acute osseous findings identified within the hindfoot or midfoot. 3. Underlying mild tibiotalar degenerative changes and small ankle joint effusion. Subtalar ankylosis. 4. Forefoot findings are dictated separately. Electronically Signed   By: Elsie Perone M.D.   On: 01/11/2024 08:19   DG Foot Complete Right Result Date: 01/10/2024 CLINICAL DATA:  Unwitnessed fall. EXAM: RIGHT TIBIA AND FIBULA - 2 VIEW; RIGHT FOOT COMPLETE - 3+ VIEW COMPARISON:  Right ankle series 12/28/2019, right foot series 04/26/2018. FINDINGS: Right tibia fibula series, two views in 4 films: There is advanced bone density loss. 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 On the second image, there is a suspected transverse oblique intra-articular fracture through the medial malleolus of the distal tibia. There is no further evidence of fractures. There is moderate nonerosive tibiotalar arthrosis. Dorsal calcaneal spurring. Right foot, three views: The medial malleolar nondisplaced fracture is redemonstrated. This is best seen on the oblique view.  There is profound bone density loss. No displaced fracture is seen in the foot. There is pes planus and moderate arthrosis in the midfoot, near bone-on-bone first MTP joint space loss, and moderate hallux valgus. There are hammertoe deformities of second, third and fourth toes limiting assessment of the digits. There is generalized swelling in the foot, increased. There are vascular calcifications in the anterior and posterior distal foreleg. IMPRESSION: 1. Suspected nondisplaced transverse oblique intra-articular fracture through the medial malleolus of the distal tibia. 2. No further evidence of fractures in the right tibia fibula or right foot. 3. Degenerative changes and profound bone density loss. 4. First MTP joint arthrosis and hallux valgus.  Pes planus. 5. Vascular calcifications. 6.  Soft tissue swelling. Electronically Signed   By: Francis Quam M.D.   On: 01/10/2024 03:49   DG Tibia/Fibula Right Result Date: 01/10/2024 CLINICAL DATA:  Unwitnessed fall. EXAM: RIGHT TIBIA AND FIBULA - 2 VIEW; RIGHT FOOT COMPLETE - 3+ VIEW COMPARISON:  Right ankle series 12/28/2019, right foot series 04/26/2018. FINDINGS: Right tibia fibula series, two views in 4 films: There is advanced bone density loss. 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 On the second image, there is a suspected transverse oblique intra-articular fracture through the medial malleolus of the distal tibia. There is no further evidence of fractures. There is moderate nonerosive tibiotalar arthrosis. Dorsal calcaneal spurring. Right foot, three views: The medial malleolar nondisplaced fracture is redemonstrated. This is best seen on the oblique view. There is profound bone density loss. No displaced fracture is seen in the foot. There is pes planus  and moderate arthrosis in the midfoot, near bone-on-bone first MTP joint space loss, and moderate hallux valgus. There are hammertoe deformities of second, third and fourth toes limiting assessment of  the digits. There is generalized swelling in the foot, increased. There are vascular calcifications in the anterior and posterior distal foreleg. IMPRESSION: 1. Suspected nondisplaced transverse oblique intra-articular fracture through the medial malleolus of the distal tibia. 2. No further evidence of fractures in the right tibia fibula or right foot. 3. Degenerative changes and profound bone density loss. 4. First MTP joint arthrosis and hallux valgus.  Pes planus. 5. Vascular calcifications. 6.  Soft tissue swelling. Electronically Signed   By: Francis Quam M.D.   On: 01/10/2024 03:49   DG Shoulder Right Result Date: 01/10/2024 CLINICAL DATA:  Unwitnessed fall at home. EXAM: RIGHT SHOULDER - 2+ VIEW COMPARISON:  None. FINDINGS: Three views. Osteopenia. No evidence of fracture or dislocation. Mild nonerosive arthrosis noted of the right glenohumeral and AC joints, with spurring at the greater tuberosity. Soft tissues are unremarkable. IMPRESSION: Osteopenia and degenerative change without evidence of fractures. Electronically Signed   By: Francis Quam M.D.   On: 01/10/2024 03:37   CT CHEST ABDOMEN PELVIS W CONTRAST Result Date: 01/10/2024 CLINICAL DATA:  Unwitnessed fall at home with blunt polytrauma. EXAM: CT CHEST, ABDOMEN, AND PELVIS WITH CONTRAST TECHNIQUE: Multidetector CT imaging of the chest, abdomen and pelvis was performed following the standard protocol during bolus administration of intravenous contrast. RADIATION DOSE REDUCTION: This exam was performed according to the departmental dose-optimization program which includes automated exposure control, adjustment of the mA and/or kV according to patient size and/or use of iterative reconstruction technique. CONTRAST:  70mL OMNIPAQUE  IOHEXOL  300 MG/ML  SOLN COMPARISON:  Chest PA Lat 05/24/2015.  No other relevant prior. FINDINGS: CT CHEST FINDINGS Cardiovascular: There is mild cardiomegaly with biatrial chamber predominance. Scattered  calcification in the mitral ring. There are moderate calcifications and thickening of the aortic valve leaflets. The thoracic aorta demonstrates moderate patchy calcifications, scattered calcification in the great vessels, mixed plaques in the descending segment and no aneurysm, stenosis or dissection. There is a 1.1 cm rounded sessile focal soft plaque deposit along the left posterior proximal descending aortic wall. No penetrating ulcer. There is an enlarged pulmonary trunk 3.3 cm indicating arterial hypertension. No central embolus is seen. No venous dilatation. There is no pericardial effusion. There are left main and scattered two-vessel coronary artery calcifications in the LAD and right coronary artery. Mediastinum/Nodes: 1.6 cm heterogeneous solid nodule right lobe of the thyroid  gland. This would ordinarily warrant a nonemergent ultrasound follow-up recommendation, but this should only be done if clinically warranted given her advanced age. Remaining thyroid  is unremarkable. There are no enlarged axillary lymph nodes. No intrathoracic adenopathy. Patulous but otherwise unremarkable esophagus, likely reflecting chronic dysmotility. The thoracic trachea and main bronchi are clear. Lungs/Pleura: There is mild diffuse bronchial thickening. There is posterior atelectasis in both lungs. Elevated right hemidiaphragm. There is a calcified right upper lobe granuloma laterally. There is no consolidation, nodules, effusion or pneumothorax. Musculoskeletal: Osteopenia with degenerative changes of the spine and mild kyphosis. 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 No regional skeletal fracture is seen. No chest wall mass. CT ABDOMEN PELVIS FINDINGS Hepatobiliary: There are occasional scattered tiny hypodensities in the hepatic substance which are too small to characterize. No follow-up imaging is needed in a low risk patient. There is no mass enhancement, laceration or perihepatic hematoma. Gallbladder and bile ducts are  unremarkable. Pancreas: Partially atrophic.  No mass.  No ductal dilatation. Spleen: There is a 3 cm in length grade 2 linear laceration in the superior aspect of the spleen, extending from anterior to posterior. There is a small subcapsular hematoma at the superior aspect of the laceration, measuring up to 2 cm. In the medial lower spleen there is a 1.5 cm cyst, Hounsfield density is 13. No other focal abnormality. Adrenals/Urinary Tract: Slight nodular thickening both adrenal glands. Cortical thinning noted in both kidneys with scattered tiny Bosniak 2 bilateral cortical cysts which are too small to characterize. No follow-up imaging is recommended. No adrenal or perirenal hemorrhage or renal laceration is seen. There is no urinary stone or obstruction. The bladder is contracted and not well seen but there are no inflammatory changes around it. The wall does appear more thickened than expected for contraction. Correlate clinically for underlying cystitis. There is pelvic floor laxity without evidence of cystocele. Stomach/Bowel: Negative gastric wall, unopacified small bowel and appendix. Diffuse colonic diverticulosis. There is faint interstitial haziness around the distal descending colon, which could be fat scarring from prior diverticular disease or could indicate a mild acute diverticulitis. There is no free air or abscess associated with this. Vascular/Lymphatic: Moderate to aortoiliac calcific plaque. No AAA. No dissection. Bilateral pelvic venous congestion extends to the outer uterine walls. No lymphadenopathy is seen. Reproductive: Uterus and bilateral adnexa are unremarkable. Other: No free hemorrhage, free ascites, or free air is seen. No incarcerated hernias. Musculoskeletal: There is a chronic mild upper plate L1 anterior wedge compression fracture deformity, which was seen on the prior PA and lateral chest and unchanged. Osteopenia and degenerative change of the spine is noted with advanced L5-S1  facet hypertrophy. No acute regional skeletal fracture. IMPRESSION: 1. 3 cm in length grade 2 linear laceration in the superior aspect of the spleen with a small subcapsular hematoma at the superior aspect of the laceration. 2. No other acute trauma related findings in the chest, abdomen or pelvis. 3. Aortic and coronary artery atherosclerosis, including calcifications and thickening of the aortic valve leaflets. 4. Enlarged pulmonary trunk indicating arterial hypertension. 5. 1.6 cm heterogeneous solid nodule right lobe of the thyroid  gland. This would ordinarily warrant a nonemergent ultrasound follow-up recommendation, but this should only be done if clinically warranted given her advanced age. 6. Diffuse bronchial thickening consistent with bronchitis. 7. Cystitis versus bladder nondistention. 8. Diverticulosis with faint interstitial haziness around the distal descending colon, which could be fat scarring from prior diverticular disease or could indicate a mild acute diverticulitis. No free air or abscess. 9. Scattered tiny hypodensities in the liver, too small to characterize with CT. No follow-up imaging is needed in a low risk patient. 10. Pelvic venous congestion. 11. Osteopenia and degenerative change. Chronic L1 compression fracture. 12. Critical Value/emergent results were called by telephone at the time of interpretation on 01/10/2024 at 3:26 am to provider [facility] , who verbally acknowledged these results. Aortic Atherosclerosis (ICD10-I70.0). Electronically Signed   By: Francis Quam M.D.   On: 01/10/2024 03:35   CT Head Wo Contrast Result Date: 01/10/2024 EXAM: CT HEAD, FACIAL BONES AND CERVICAL SPINE WITHOUT CONTRAST 01/10/2024 02:36:58 AM TECHNIQUE: CT of the head, facial bones and cervical spine was performed without the administration of intravenous contrast. Multiplanar reformatted images are provided for review. Automated exposure control, iterative reconstruction, and/or weight based  adjustment of the mA/kV was utilized to reduce the radiation dose to as low as reasonably achievable. COMPARISON: MRI brain dated 02/21/2015. CLINICAL HISTORY: Fall.  FINDINGS: CT HEAD BRAIN AND VENTRICLES: Global cortical atrophy with prominent left extra-axial CSF space, chronic. No acute intracranial hemorrhage. No mass effect or midline shift. No extra-axial fluid collection. No evidence of acute infarct. No hydrocephalus. SKULL AND SCALP: No acute skull fracture. No scalp hematoma. CT FACIAL BONES FACIAL BONES: No acute facial fracture. No mandibular dislocation. No suspicious bone lesion. ORBITS: No acute traumatic injury. SINUSES AND MASTOIDS: No acute abnormality. SOFT TISSUES: No acute abnormality. CT CERVICAL SPINE BONES AND ALIGNMENT: No acute fracture or traumatic malalignment. DEGENERATIVE CHANGES: Mild degenerative changes at C3-4. SOFT TISSUES: No prevertebral soft tissue swelling. VASCULATURE: Intracranial atherosclerosis. CALCIFICATIONS: Bilateral basal ganglia calcifications. IMPRESSION: 1. No acute intracranial abnormality. 2. No evidence of maxillofacial fracture. 3. No acute traumatic injury to the cervical spine. Electronically signed by: Pinkie Pebbles MD 01/10/2024 02:52 AM EDT RP Workstation: [id number]   CT Cervical Spine Wo Contrast Result Date: 01/10/2024 EXAM: CT HEAD, FACIAL BONES AND CERVICAL SPINE WITHOUT CONTRAST 01/10/2024 02:36:58 AM TECHNIQUE: CT of the head, facial bones and cervical spine was performed without the administration of intravenous contrast. Multiplanar reformatted images are provided for review. Automated exposure control, iterative reconstruction, and/or weight based adjustment of the mA/kV was utilized to reduce the radiation dose to as low as reasonably achievable. COMPARISON: MRI brain dated 02/21/2015. CLINICAL HISTORY: Fall. FINDINGS: CT HEAD BRAIN AND VENTRICLES: Global cortical atrophy with prominent left extra-axial CSF space, chronic. No acute  intracranial hemorrhage. No mass effect or midline shift. No extra-axial fluid collection. No evidence of acute infarct. No hydrocephalus. SKULL AND SCALP: No acute skull fracture. No scalp hematoma. CT FACIAL BONES FACIAL BONES: No acute facial fracture. No mandibular dislocation. No suspicious bone lesion. ORBITS: No acute traumatic injury. SINUSES AND MASTOIDS: No acute abnormality. SOFT TISSUES: No acute abnormality. CT CERVICAL SPINE BONES AND ALIGNMENT: No acute fracture or traumatic malalignment. DEGENERATIVE CHANGES: Mild degenerative changes at C3-4. SOFT TISSUES: No prevertebral soft tissue swelling. VASCULATURE: Intracranial atherosclerosis. CALCIFICATIONS: Bilateral basal ganglia calcifications. IMPRESSION: 1. No acute intracranial abnormality. 2. No evidence of maxillofacial fracture. 3. No acute traumatic injury to the cervical spine. Electronically signed by: Pinkie Pebbles MD 01/10/2024 02:52 AM EDT RP Workstation: [id number]   CT Maxillofacial Wo Contrast Result Date: 01/10/2024 EXAM: CT HEAD, FACIAL BONES AND CERVICAL SPINE WITHOUT CONTRAST 01/10/2024 02:36:58 AM TECHNIQUE: CT of the head, facial bones and cervical spine was performed without the administration of intravenous contrast. Multiplanar reformatted images are provided for review. Automated exposure control, iterative reconstruction, and/or weight based adjustment of the mA/kV was utilized to reduce the radiation dose to as low as reasonably achievable. COMPARISON: MRI brain dated 02/21/2015. CLINICAL HISTORY: Fall. FINDINGS: CT HEAD BRAIN AND VENTRICLES: Global cortical atrophy with prominent left extra-axial CSF space, chronic. No acute intracranial hemorrhage. No mass effect or midline shift. No extra-axial fluid collection. No evidence of acute infarct. No hydrocephalus. SKULL AND SCALP: No acute skull fracture. No scalp hematoma. CT FACIAL BONES FACIAL BONES: No acute facial fracture. No mandibular dislocation. No suspicious  bone lesion. ORBITS: No acute traumatic injury. SINUSES AND MASTOIDS: No acute abnormality. SOFT TISSUES: No acute abnormality. CT CERVICAL SPINE BONES AND ALIGNMENT: No acute fracture or traumatic malalignment. DEGENERATIVE CHANGES: Mild degenerative changes at C3-4. SOFT TISSUES: No prevertebral soft tissue swelling. VASCULATURE: Intracranial atherosclerosis. CALCIFICATIONS: Bilateral basal ganglia calcifications. IMPRESSION: 1. No acute intracranial abnormality. 2. No evidence of maxillofacial fracture. 3. No acute traumatic injury to the cervical spine. Electronically signed by: Pinkie Pebbles MD 01/10/2024 02:52 AM  EDT RP Workstation: [id number]     Assessment and Plan:  1. Fall vs syncope c/b splenic laceration, malleolar fracture, episodic dizzy spells - unwitnessed -pt adamant that she did not pass out, family indicates they aren't sure because there was a delay between fall and time when husband found her - she was awake when discovered - surgery/ortho managing injuries - will need input on if/when she might be safe to start anticoagulation - would benefit from PT when safe - orthostatics also pending (amlodipine  on hold) - would benefit from monitor at discharge  2. Newly recognized AF with normal heart rate - no prior diagnosis, rate spontaneously controlled - not currently a candidate for OAC given splenic laceration - ordered TSH for AM - will need OP follow-up for anticoag plan with input from surgical team when may be a candidate to restart, also with consideration of Watchman moving forward  3. Coronary/aortic atherosclerosis by imaging - troponins negative, no recent angina - continue statin therapy, lipids mg'd by PCP  4. Mild-moderate aortic stenosis, mild MR - aortic stenosis likely accounting for murmur, no intervention needed at this time - anticipate OP clinical f/u for this   Risk Assessment/Risk Scores:      CHA2DS2-VASc Score = 4   This indicates a 4.8%  annual risk of stroke. The patient's score is based upon: CHF History: 0 HTN History: 1 Diabetes History: 0 Stroke History: 0 Vascular Disease History: 0 Age Score: 2 Gender Score: 1    For questions or updates, please contact [facility] Please consult [email or web address] for contact info under      Signed, Dayna N Dunn, PA-C  01/11/2024 6:32 PM   I have seen and examined the patient along with Dayna N Dunn, PA-C .  I have reviewed the chart, notes and new data.  I agree with PA/NP's note.  Key new complaints: I personally interviewed the patient and her son [name].  She is insistent that she never lost consciousness and thinks that she simply lost her balance.  Her son says that the patient's husband, who was not present when she passed out thinks that she may have lost consciousness, but she was awake when he went to check on her.  She is currently not experiencing any cardiovascular complaints.  At home she has occasional palpitations which she describes as a rapid fluttering in her upper chest.  She has had other falls, but she does not think she has ever lost consciousness.  Family reports that she has had issues with dizziness for quite a while.  She had a fall about 6 months ago in the kitchen, but did not have medical attention for this.  She does not have exertional angina, exertional dyspnea or exertional syncope. Key examination changes: Rhythm is irregular.  She has a grade 2/6 early peaking aortic ejection murmur that is heard loudest at the right upper sternal border but which radiates broadly across the anterior precordium and towards the carotids.  The carotid pulses are full and are not delayed.  She does not have edema and her lungs are clear.  All distal pulses are normal. Key new findings / data: The echocardiogram shows normal left ventricular systolic function.  Based on the gradients she has very mild aortic stenosis ( mean gradient 9 mmHg).  I do not think that  the calculated aortic valve area is reliable.  The rhythm is irregular and the LVOT VTI was sampled following a much shorter systole compared to the  aortic valve VTI.  Morphologically, the findings suggest mild-moderate aortic stenosis, with the gradients possibly underestimated due to paradoxical low-flow physiology (small body size with small left ventricular cavity and borderline low stroke-volume index 33 mL/m).  The aortic jet acceleration time is quite brisk.  The echocardiographic images do not support a diagnosis of severe aortic stenosis.  She has spontaneously rate controlled atrial fibrillation that has been persistent throughout this hospital stay.  PLAN: Newly recognized persistent atrial fibrillation, previously minimally symptomatic. Newly diagnosed aortic stenosis, at most mild-moderate in severity. Treated hypercholesterolemia  In the short-term clearly she cannot receive anticoagulation due to the splenic laceration. We will have to sit down in the office and have a lengthy discussion about the pros and cons of long-term anticoagulation in this lady who has occasional falls.  CHA2DS2-VASc score is elevated at 4, but she has never had embolic events.  Clearly if she had been on anticoagulation with the current fall the consequences could have been grave. Briefly talked to her and her son [name] about the option for a Watchman device, but this would not be performed acutely, but rather after careful consideration.  As with any invasive procedure, there is a risk of actually causing complications, including stroke. Her episodes of dizziness at rest cannot be explained by the aortic stenosis which is not severe anyway.  It is possible that she does not have persistent atrial fibrillation and that she could have paroxysmal atrial fibrillation with postconversion pauses that could explain falls/dizziness.  We will have her wear an extended arrhythmia monitor when she goes home and follow-up in  clinic. At this point there is no plan for additional cardiology testing or change in treatment in the hospital.  Jerel Balding, MD, FACC [facility] [phone] 01/11/2024, 6:52 PM

## 2024-01-11 NOTE — Hospital Course (Signed)
 [name] is a 89 y.o. female with medical history significant of HTN, HLD who presents after syncopal episode. Patient is Spanish speaking and her family are at bedside to translate.  Presented with a fall and unwitnessed event of syncope. Trauma surgery was consulted. Cardiology consulted.  Assessment and Plan: Syncope and collapse. Unwitnessed fall. Suspected to be vasovagal due to poor p.o. intake. Have history of moderate aortic stenosis and was actually also found to be in A-fib which is new. On my examination some of her fatigue, right facial droop and poor attention. CT head unremarkable. CT maxillary spine unremarkable. Will get MRI brain to rule out any acute abnormality. Cardiology consult. Monitor on telemetry. Remains in A-fib but rate controlled. Check orthostatic vitals and PT OT consultation.   New onset A-fib Rate controlled Echocardiogram was recently completed 01/06/2024 which revealed EF 55 to 60%, normal diastolic parameter, mild to moderate aortic stenosis, mild mitral regurgitation Cardiology consulted.   Splenic laceration Trauma surgery consulted Currently on regular diet.  H&H relatively stable with dilutional component. Monitor clinically. Avoiding anticoagulation due to splenic laceration.   Right medial malleolus fracture Orthopedic surgery consulted Currently recommending conservative management. Nonweightbearing on right lower extremity.   Thyroid  nodule  incidental finding on CT 1.6 cm heterogeneous solid nodule right lobe of the thyroid  gland. This would ordinarily warrant a nonemergent ultrasound follow-up recommendation, but this should only be done if clinically warranted given her advanced age.   HTN Hold Norvasc     HLD Hold Lipitor   Aortic stenosis. Mild to moderate. Appreciate cardiology consultation.

## 2024-01-11 NOTE — Progress Notes (Signed)
 [facility] Progress Note Patient: [name] FMW:[id number] DOB: 05/27/1932  DOA: 01/10/2024 DOS: the patient was seen and examined on 01/11/2024  Brief Hospital Course: Maddalynn Barnard is a 89 y.o. female with medical history significant of HTN, HLD who presents after syncopal episode. Patient is Spanish speaking and her family are at bedside to translate.  Presented with a fall and unwitnessed event of syncope. Trauma surgery was consulted. Cardiology consulted.  Assessment and Plan: Syncope and collapse. Unwitnessed fall. Suspected to be vasovagal due to poor p.o. intake. Have history of moderate aortic stenosis and was actually also found to be in A-fib which is new. On my examination some of her fatigue, right facial droop and poor attention. CT head unremarkable. CT maxillary spine unremarkable. Will get MRI brain to rule out any acute abnormality. Cardiology consult. Monitor on telemetry. Remains in A-fib but rate controlled. Check orthostatic vitals and PT OT consultation.   New onset A-fib Rate controlled Echocardiogram was recently completed 01/06/2024 which revealed EF 55 to 60%, normal diastolic parameter, mild to moderate aortic stenosis, mild mitral regurgitation Cardiology consulted.   Splenic laceration Trauma surgery consulted Currently on regular diet.  H&H relatively stable with dilutional component. Monitor clinically. Avoiding anticoagulation due to splenic laceration.   Right medial malleolus fracture Orthopedic surgery consulted Currently recommending conservative management. Nonweightbearing on right lower extremity.   Thyroid  nodule  incidental finding on CT 1.6 cm heterogeneous solid nodule right lobe of the thyroid  gland. This would ordinarily warrant a nonemergent ultrasound follow-up recommendation, but this should only be done if clinically warranted given her advanced age.   HTN Hold Norvasc     HLD Hold Lipitor   Aortic  stenosis. Mild to moderate. Appreciate cardiology consultation.   Subjective: No nausea or vomiting.  Was fatigued later in the day. Within the span of 20 minutes patient reported pain 8 out of 10 then 6 out of 10 and then 3 out of 10 in her ankle without any intervention.  Physical Exam: Aortic systolic murmur. S1-S2 present Bowel sound present No pronator drift. No asterixis. Pupils are equal and reactive to light. Able to track although difficult to pay attention. Subjective right facial droop. Tongue midline. Right leg wrapped.  Data Reviewed: I have Reviewed nursing notes, Vitals, and Lab results. Since last encounter, pertinent lab results CBC and BMP   . I have ordered test including CBC and BMP  . I have discussed pt's care plan and test results with trauma surgery and cardiology  .  Disposition: Status is: Inpatient Remains inpatient appropriate because: Monitor for stability of H&H.  SCDs Start: 01/10/24 0807   Family Communication: Family at bedside Level of care: Telemetry Medical continue Vitals:   01/10/24 1900 01/11/24 0409 01/11/24 0732 01/11/24 1423  BP: 122/67 (!) 113/57 (!) 113/59 (!) 117/53  Pulse: 63 93 74 75  Resp: 16  17 16   Temp: 98 F (36.7 C) 98.5 F (36.9 C) 97.6 F (36.4 C) 98.5 F (36.9 C)  TempSrc:   Oral Oral  SpO2: 95% 92% 96% 95%  Weight:      Height:         Author: Yetta Blanch, MD 01/11/2024 7:04 PM  Please look on [email or web address] to find out who is on call.

## 2024-01-11 NOTE — Progress Notes (Signed)
 Subjective: CC: Family at bedside. Spanish interpreter was offered and declined. Family used for spanish interpreter.   Patient reports epigastric abdominal pain that is stable from yesterday. Tolerating cld without n/v. No flatus or bm. Voiding. On bedrest.   Afebrile. No tachycardia or systolic hypotension. Hgb stable at 9.7 from 9.7. INR 1.2.   Objective: Vital signs in last 24 hours: Temp:  [97.6 F (36.4 C)-98.5 F (36.9 C)] 97.6 F (36.4 C) (09/22 0732) Pulse Rate:  [63-93] 74 (09/22 0732) Resp:  [16-17] 17 (09/22 0732) BP: (108-122)/(51-67) 113/59 (09/22 0732) SpO2:  [92 %-96 %] 96 % (09/22 0732) Last BM Date : 01/09/24  Intake/Output from previous day: 09/21 0701 - 09/22 0700 In: 1023.8 [P.O.:360; I.V.:663.8] Out: -  Intake/Output this shift: No intake/output data recorded.  PE: Gen:  Alert, NAD, pleasant Card:  Reg rate Pulm:  CTAB, no W/R/R, effort normal Abd: Soft, ND, she reports mild generalized ttp that is greatest in the upper abdomen. No rigidity or guarding.  Ext:  R ankle splint in place - wiggles digits, wwp. No LLE edema  Lab Results:  Recent Labs    01/10/24 0131 01/10/24 1033 01/10/24 2248 01/11/24 0654  WBC 11.8*  --   --  7.0  HGB 11.2*   < > 9.7* 9.7*  HCT 33.6*   < > 28.7* 29.5*  PLT 302  --   --  226   < > = values in this interval not displayed.   BMET Recent Labs    01/10/24 0131 01/11/24 0654  NA 134* 132*  K 4.1 3.8  CL 99 103  CO2 21* 20*  GLUCOSE 116* 92  BUN 25* 8  CREATININE 0.95 0.70  CALCIUM  9.0 7.7*   PT/INR Recent Labs    01/11/24 0654  LABPROT 16.2*  INR 1.2   CMP     Component Value Date/Time   NA 132 (L) 01/11/2024 0654   NA 140 01/23/2023 1138   K 3.8 01/11/2024 0654   CL 103 01/11/2024 0654   CO2 20 (L) 01/11/2024 0654   GLUCOSE 92 01/11/2024 0654   BUN 8 01/11/2024 0654   BUN 15 01/23/2023 1138   CREATININE 0.70 01/11/2024 0654   CREATININE 0.86 11/13/2021 1115   CALCIUM  7.7 (L)  01/11/2024 0654   PROT 5.7 (L) 01/11/2024 0654   PROT 6.6 01/23/2023 1138   ALBUMIN 2.6 (L) 01/11/2024 0654   ALBUMIN 3.8 01/23/2023 1138   AST 18 01/11/2024 0654   ALT 13 01/11/2024 0654   ALKPHOS 66 01/11/2024 0654   BILITOT 0.9 01/11/2024 0654   BILITOT 0.5 01/23/2023 1138   GFRNONAA >60 01/11/2024 0654   GFRNONAA 73 04/26/2018 1005   GFRAA 59 (L) 12/28/2019 1530   GFRAA 84 04/26/2018 1005   Lipase  No results found for: LIPASE  Studies/Results: CT Foot Right Wo Contrast Result Date: 01/11/2024 CLINICAL DATA:  Foot trauma, occult fracture suspected. EXAM: CT OF THE RIGHT FOREFOOT WITHOUT CONTRAST TECHNIQUE: Multidetector CT imaging of the right forefoot was performed according to the standard protocol. Multiplanar CT image reconstructions were also generated. RADIATION DOSE REDUCTION: This exam was performed according to the departmental dose-optimization program which includes automated exposure control, adjustment of the mA and/or kV according to patient size and/or use of iterative reconstruction technique. COMPARISON:  Radiographs 01/10/2024 and 12/24/2023. FINDINGS: Bones/Joint/Cartilage Ankle and hindfoot findings are dictated separately. Examination limited by osseous demineralization and hammertoe deformities. There is posttraumatic deformity of the 1st  metatarsal neck. No definite acute fracture or dislocation identified within the forefoot or midfoot. Ankle fracture details are dictated separately. Mild forefoot degenerative changes, greatest at the 1st metatarsophalangeal joint. Ligaments Suboptimally assessed by CT. Muscles and Tendons Generalized muscular atrophy. No focal intramuscular fluid collection or tendon rupture identified. Soft tissues Nonspecific mild to moderate dorsal subcutaneous edema. No focal fluid collection, foreign body or soft tissue emphysema identified. Scattered vascular calcifications. IMPRESSION: 1. No definite acute fracture or dislocation identified  within the forefoot or midfoot. 2. Ankle and hindfoot findings are dictated separately. 3. Nonspecific dorsal subcutaneous edema. No focal fluid collection, foreign body or soft tissue emphysema identified. Electronically Signed   By: Elsie Perone M.D.   On: 01/11/2024 09:31   CT Ankle Right Wo Contrast Result Date: 01/11/2024 CLINICAL DATA:  Ankle trauma, fracture seen on x-ray. EXAM: CT OF THE RIGHT ANKLE WITHOUT CONTRAST TECHNIQUE: Multidetector CT imaging of the right ankle was performed according to the standard protocol. Multiplanar CT image reconstructions were also generated. RADIATION DOSE REDUCTION: This exam was performed according to the departmental dose-optimization program which includes automated exposure control, adjustment of the mA and/or kV according to patient size and/or use of iterative reconstruction technique. COMPARISON:  Radiographs 01/10/2024 and 12/24/2023. FINDINGS: Bones/Joint/Cartilage Forefoot findings are dictated separately. The bones are diffusely demineralized. There is a nondisplaced bimalleolar fracture at the ankle. Mildly comminuted oblique fracture through the base of the medial malleolus demonstrates no significant involvement of the weight-bearing articular surface. There is an oblique, nondisplaced fracture through distal fibular diaphysis. In addition, there is a small acute appearing avulsion fracture along the anterior aspect of the lateral malleolus. The posterior malleolus appears intact. Underlying mild tibiotalar degenerative changes and small ankle joint effusion. Unchanged, nonaggressive appearing chondroid lesion in the distal tibial diaphysis. No acute osseous findings are identified within the hindfoot or midfoot. Subtalar ankylosis noted. Ligaments Suboptimally assessed by CT. Muscles and Tendons As evaluated by CT, the ankle tendons appear intact, without entrapment in the fractures. Generalized muscular atrophy without apparent focal abnormality. Soft  tissues Mild soft tissue swelling around the ankle. No focal fluid collection, foreign body or soft tissue emphysema. There are scattered vascular calcifications. IMPRESSION: 1. Nondisplaced bimalleolar fracture at the ankle as described. 2. No other acute osseous findings identified within the hindfoot or midfoot. 3. Underlying mild tibiotalar degenerative changes and small ankle joint effusion. Subtalar ankylosis. 4. Forefoot findings are dictated separately. Electronically Signed   By: Elsie Perone M.D.   On: 01/11/2024 08:19   DG Foot Complete Right Result Date: 01/10/2024 CLINICAL DATA:  Unwitnessed fall. EXAM: RIGHT TIBIA AND FIBULA - 2 VIEW; RIGHT FOOT COMPLETE - 3+ VIEW COMPARISON:  Right ankle series 12/28/2019, right foot series 04/26/2018. FINDINGS: Right tibia fibula series, two views in 4 films: There is advanced bone density loss. 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 On the second image, there is a suspected transverse oblique intra-articular fracture through the medial malleolus of the distal tibia. There is no further evidence of fractures. There is moderate nonerosive tibiotalar arthrosis. Dorsal calcaneal spurring. Right foot, three views: The medial malleolar nondisplaced fracture is redemonstrated. This is best seen on the oblique view. There is profound bone density loss. No displaced fracture is seen in the foot. There is pes planus and moderate arthrosis in the midfoot, near bone-on-bone first MTP joint space loss, and moderate hallux valgus. There are hammertoe deformities of second, third and fourth toes limiting assessment of the digits. There is  generalized swelling in the foot, increased. There are vascular calcifications in the anterior and posterior distal foreleg. IMPRESSION: 1. Suspected nondisplaced transverse oblique intra-articular fracture through the medial malleolus of the distal tibia. 2. No further evidence of fractures in the right tibia fibula or right foot. 3.  Degenerative changes and profound bone density loss. 4. First MTP joint arthrosis and hallux valgus.  Pes planus. 5. Vascular calcifications. 6.  Soft tissue swelling. Electronically Signed   By: Francis Quam M.D.   On: 01/10/2024 03:49   DG Tibia/Fibula Right Result Date: 01/10/2024 CLINICAL DATA:  Unwitnessed fall. EXAM: RIGHT TIBIA AND FIBULA - 2 VIEW; RIGHT FOOT COMPLETE - 3+ VIEW COMPARISON:  Right ankle series 12/28/2019, right foot series 04/26/2018. FINDINGS: Right tibia fibula series, two views in 4 films: There is advanced bone density loss. 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 On the second image, there is a suspected transverse oblique intra-articular fracture through the medial malleolus of the distal tibia. There is no further evidence of fractures. There is moderate nonerosive tibiotalar arthrosis. Dorsal calcaneal spurring. Right foot, three views: The medial malleolar nondisplaced fracture is redemonstrated. This is best seen on the oblique view. There is profound bone density loss. No displaced fracture is seen in the foot. There is pes planus and moderate arthrosis in the midfoot, near bone-on-bone first MTP joint space loss, and moderate hallux valgus. There are hammertoe deformities of second, third and fourth toes limiting assessment of the digits. There is generalized swelling in the foot, increased. There are vascular calcifications in the anterior and posterior distal foreleg. IMPRESSION: 1. Suspected nondisplaced transverse oblique intra-articular fracture through the medial malleolus of the distal tibia. 2. No further evidence of fractures in the right tibia fibula or right foot. 3. Degenerative changes and profound bone density loss. 4. First MTP joint arthrosis and hallux valgus.  Pes planus. 5. Vascular calcifications. 6.  Soft tissue swelling. Electronically Signed   By: Francis Quam M.D.   On: 01/10/2024 03:49   DG Shoulder Right Result Date: 01/10/2024 CLINICAL DATA:   Unwitnessed fall at home. EXAM: RIGHT SHOULDER - 2+ VIEW COMPARISON:  None. FINDINGS: Three views. Osteopenia. No evidence of fracture or dislocation. Mild nonerosive arthrosis noted of the right glenohumeral and AC joints, with spurring at the greater tuberosity. Soft tissues are unremarkable. IMPRESSION: Osteopenia and degenerative change without evidence of fractures. Electronically Signed   By: Francis Quam M.D.   On: 01/10/2024 03:37   CT CHEST ABDOMEN PELVIS W CONTRAST Result Date: 01/10/2024 CLINICAL DATA:  Unwitnessed fall at home with blunt polytrauma. EXAM: CT CHEST, ABDOMEN, AND PELVIS WITH CONTRAST TECHNIQUE: Multidetector CT imaging of the chest, abdomen and pelvis was performed following the standard protocol during bolus administration of intravenous contrast. RADIATION DOSE REDUCTION: This exam was performed according to the departmental dose-optimization program which includes automated exposure control, adjustment of the mA and/or kV according to patient size and/or use of iterative reconstruction technique. CONTRAST:  70mL OMNIPAQUE  IOHEXOL  300 MG/ML  SOLN COMPARISON:  Chest PA Lat 05/24/2015.  No other relevant prior. FINDINGS: CT CHEST FINDINGS Cardiovascular: There is mild cardiomegaly with biatrial chamber predominance. Scattered calcification in the mitral ring. There are moderate calcifications and thickening of the aortic valve leaflets. The thoracic aorta demonstrates moderate patchy calcifications, scattered calcification in the great vessels, mixed plaques in the descending segment and no aneurysm, stenosis or dissection. There is a 1.1 cm rounded sessile focal soft plaque deposit along the left posterior proximal descending  aortic wall. No penetrating ulcer. There is an enlarged pulmonary trunk 3.3 cm indicating arterial hypertension. No central embolus is seen. No venous dilatation. There is no pericardial effusion. There are left main and scattered two-vessel coronary artery  calcifications in the LAD and right coronary artery. Mediastinum/Nodes: 1.6 cm heterogeneous solid nodule right lobe of the thyroid  gland. This would ordinarily warrant a nonemergent ultrasound follow-up recommendation, but this should only be done if clinically warranted given her advanced age. Remaining thyroid  is unremarkable. There are no enlarged axillary lymph nodes. No intrathoracic adenopathy. Patulous but otherwise unremarkable esophagus, likely reflecting chronic dysmotility. The thoracic trachea and main bronchi are clear. Lungs/Pleura: There is mild diffuse bronchial thickening. There is posterior atelectasis in both lungs. Elevated right hemidiaphragm. There is a calcified right upper lobe granuloma laterally. There is no consolidation, nodules, effusion or pneumothorax. Musculoskeletal: Osteopenia with degenerative changes of the spine and mild kyphosis. 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 No regional skeletal fracture is seen. No chest wall mass. CT ABDOMEN PELVIS FINDINGS Hepatobiliary: There are occasional scattered tiny hypodensities in the hepatic substance which are too small to characterize. No follow-up imaging is needed in a low risk patient. There is no mass enhancement, laceration or perihepatic hematoma. Gallbladder and bile ducts are unremarkable. Pancreas: Partially atrophic.  No mass.  No ductal dilatation. Spleen: There is a 3 cm in length grade 2 linear laceration in the superior aspect of the spleen, extending from anterior to posterior. There is a small subcapsular hematoma at the superior aspect of the laceration, measuring up to 2 cm. In the medial lower spleen there is a 1.5 cm cyst, Hounsfield density is 13. No other focal abnormality. Adrenals/Urinary Tract: Slight nodular thickening both adrenal glands. Cortical thinning noted in both kidneys with scattered tiny Bosniak 2 bilateral cortical cysts which are too small to characterize. No follow-up imaging is recommended. No adrenal or  perirenal hemorrhage or renal laceration is seen. There is no urinary stone or obstruction. The bladder is contracted and not well seen but there are no inflammatory changes around it. The wall does appear more thickened than expected for contraction. Correlate clinically for underlying cystitis. There is pelvic floor laxity without evidence of cystocele. Stomach/Bowel: Negative gastric wall, unopacified small bowel and appendix. Diffuse colonic diverticulosis. There is faint interstitial haziness around the distal descending colon, which could be fat scarring from prior diverticular disease or could indicate a mild acute diverticulitis. There is no free air or abscess associated with this. Vascular/Lymphatic: Moderate to aortoiliac calcific plaque. No AAA. No dissection. Bilateral pelvic venous congestion extends to the outer uterine walls. No lymphadenopathy is seen. Reproductive: Uterus and bilateral adnexa are unremarkable. Other: No free hemorrhage, free ascites, or free air is seen. No incarcerated hernias. Musculoskeletal: There is a chronic mild upper plate L1 anterior wedge compression fracture deformity, which was seen on the prior PA and lateral chest and unchanged. Osteopenia and degenerative change of the spine is noted with advanced L5-S1 facet hypertrophy. No acute regional skeletal fracture. IMPRESSION: 1. 3 cm in length grade 2 linear laceration in the superior aspect of the spleen with a small subcapsular hematoma at the superior aspect of the laceration. 2. No other acute trauma related findings in the chest, abdomen or pelvis. 3. Aortic and coronary artery atherosclerosis, including calcifications and thickening of the aortic valve leaflets. 4. Enlarged pulmonary trunk indicating arterial hypertension. 5. 1.6 cm heterogeneous solid nodule right lobe of the thyroid  gland. This would ordinarily warrant a nonemergent  ultrasound follow-up recommendation, but this should only be done if clinically  warranted given her advanced age. 6. Diffuse bronchial thickening consistent with bronchitis. 7. Cystitis versus bladder nondistention. 8. Diverticulosis with faint interstitial haziness around the distal descending colon, which could be fat scarring from prior diverticular disease or could indicate a mild acute diverticulitis. No free air or abscess. 9. Scattered tiny hypodensities in the liver, too small to characterize with CT. No follow-up imaging is needed in a low risk patient. 10. Pelvic venous congestion. 11. Osteopenia and degenerative change. Chronic L1 compression fracture. 12. Critical Value/emergent results were called by telephone at the time of interpretation on 01/10/2024 at 3:26 am to provider [facility] , who verbally acknowledged these results. Aortic Atherosclerosis (ICD10-I70.0). Electronically Signed   By: Francis Quam M.D.   On: 01/10/2024 03:35   CT Head Wo Contrast Result Date: 01/10/2024 EXAM: CT HEAD, FACIAL BONES AND CERVICAL SPINE WITHOUT CONTRAST 01/10/2024 02:36:58 AM TECHNIQUE: CT of the head, facial bones and cervical spine was performed without the administration of intravenous contrast. Multiplanar reformatted images are provided for review. Automated exposure control, iterative reconstruction, and/or weight based adjustment of the mA/kV was utilized to reduce the radiation dose to as low as reasonably achievable. COMPARISON: MRI brain dated 02/21/2015. CLINICAL HISTORY: Fall. FINDINGS: CT HEAD BRAIN AND VENTRICLES: Global cortical atrophy with prominent left extra-axial CSF space, chronic. No acute intracranial hemorrhage. No mass effect or midline shift. No extra-axial fluid collection. No evidence of acute infarct. No hydrocephalus. SKULL AND SCALP: No acute skull fracture. No scalp hematoma. CT FACIAL BONES FACIAL BONES: No acute facial fracture. No mandibular dislocation. No suspicious bone lesion. ORBITS: No acute traumatic injury. SINUSES AND MASTOIDS: No acute  abnormality. SOFT TISSUES: No acute abnormality. CT CERVICAL SPINE BONES AND ALIGNMENT: No acute fracture or traumatic malalignment. DEGENERATIVE CHANGES: Mild degenerative changes at C3-4. SOFT TISSUES: No prevertebral soft tissue swelling. VASCULATURE: Intracranial atherosclerosis. CALCIFICATIONS: Bilateral basal ganglia calcifications. IMPRESSION: 1. No acute intracranial abnormality. 2. No evidence of maxillofacial fracture. 3. No acute traumatic injury to the cervical spine. Electronically signed by: Pinkie Pebbles MD 01/10/2024 02:52 AM EDT RP Workstation: [id number]   CT Cervical Spine Wo Contrast Result Date: 01/10/2024 EXAM: CT HEAD, FACIAL BONES AND CERVICAL SPINE WITHOUT CONTRAST 01/10/2024 02:36:58 AM TECHNIQUE: CT of the head, facial bones and cervical spine was performed without the administration of intravenous contrast. Multiplanar reformatted images are provided for review. Automated exposure control, iterative reconstruction, and/or weight based adjustment of the mA/kV was utilized to reduce the radiation dose to as low as reasonably achievable. COMPARISON: MRI brain dated 02/21/2015. CLINICAL HISTORY: Fall. FINDINGS: CT HEAD BRAIN AND VENTRICLES: Global cortical atrophy with prominent left extra-axial CSF space, chronic. No acute intracranial hemorrhage. No mass effect or midline shift. No extra-axial fluid collection. No evidence of acute infarct. No hydrocephalus. SKULL AND SCALP: No acute skull fracture. No scalp hematoma. CT FACIAL BONES FACIAL BONES: No acute facial fracture. No mandibular dislocation. No suspicious bone lesion. ORBITS: No acute traumatic injury. SINUSES AND MASTOIDS: No acute abnormality. SOFT TISSUES: No acute abnormality. CT CERVICAL SPINE BONES AND ALIGNMENT: No acute fracture or traumatic malalignment. DEGENERATIVE CHANGES: Mild degenerative changes at C3-4. SOFT TISSUES: No prevertebral soft tissue swelling. VASCULATURE: Intracranial atherosclerosis.  CALCIFICATIONS: Bilateral basal ganglia calcifications. IMPRESSION: 1. No acute intracranial abnormality. 2. No evidence of maxillofacial fracture. 3. No acute traumatic injury to the cervical spine. Electronically signed by: Pinkie Pebbles MD 01/10/2024 02:52 AM EDT RP Workstation: [id number]  CT Maxillofacial Wo Contrast Result Date: 01/10/2024 EXAM: CT HEAD, FACIAL BONES AND CERVICAL SPINE WITHOUT CONTRAST 01/10/2024 02:36:58 AM TECHNIQUE: CT of the head, facial bones and cervical spine was performed without the administration of intravenous contrast. Multiplanar reformatted images are provided for review. Automated exposure control, iterative reconstruction, and/or weight based adjustment of the mA/kV was utilized to reduce the radiation dose to as low as reasonably achievable. COMPARISON: MRI brain dated 02/21/2015. CLINICAL HISTORY: Fall. FINDINGS: CT HEAD BRAIN AND VENTRICLES: Global cortical atrophy with prominent left extra-axial CSF space, chronic. No acute intracranial hemorrhage. No mass effect or midline shift. No extra-axial fluid collection. No evidence of acute infarct. No hydrocephalus. SKULL AND SCALP: No acute skull fracture. No scalp hematoma. CT FACIAL BONES FACIAL BONES: No acute facial fracture. No mandibular dislocation. No suspicious bone lesion. ORBITS: No acute traumatic injury. SINUSES AND MASTOIDS: No acute abnormality. SOFT TISSUES: No acute abnormality. CT CERVICAL SPINE BONES AND ALIGNMENT: No acute fracture or traumatic malalignment. DEGENERATIVE CHANGES: Mild degenerative changes at C3-4. SOFT TISSUES: No prevertebral soft tissue swelling. VASCULATURE: Intracranial atherosclerosis. CALCIFICATIONS: Bilateral basal ganglia calcifications. IMPRESSION: 1. No acute intracranial abnormality. 2. No evidence of maxillofacial fracture. 3. No acute traumatic injury to the cervical spine. Electronically signed by: Pinkie Pebbles MD 01/10/2024 02:52 AM EDT RP Workstation: [id number]     Anti-infectives: Anti-infectives (From admission, onward)    None        Assessment/Plan GLF, syncopal episode G2 spleen laceration - HDS, hgb stable. D/c bedrest. Mobilize. Advance diet. AM labs.  R ankle fx - Per Ortho, they recommend splint and NWB, f/u in 2 weeks  - Per [facility] -  Syncope New onset A. Fib  R thyroid  nodule Hypodensities of the liver on CT Chronic L1 compression fx HTN HLD   FEN - REg, IVF per [facility] VTE - SCDs, hold chem ppx given splenic laceration  ID - None Foley - None, spont void Plan - D/c bedrest, mobilize with PT, advance diet, multimodal pain control, trend hgb  I reviewed nursing notes, Consultant (Ortho) notes, hospitalist notes, last 24 h vitals and pain scores, last 48 h intake and output, last 24 h labs and trends, and last 24 h imaging results.    LOS: 1 day    Ozell CHRISTELLA Shaper, [facility] Surgery 01/11/2024, 10:21 AM Please see Amion for pager number during day hours 7:00am-4:30pm

## 2024-01-12 ENCOUNTER — Other Ambulatory Visit: Payer: Self-pay | Admitting: Physician Assistant

## 2024-01-12 ENCOUNTER — Inpatient Hospital Stay (HOSPITAL_COMMUNITY)

## 2024-01-12 DIAGNOSIS — R55 Syncope and collapse: Secondary | ICD-10-CM | POA: Diagnosis not present

## 2024-01-12 DIAGNOSIS — I480 Paroxysmal atrial fibrillation: Secondary | ICD-10-CM

## 2024-01-12 LAB — BASIC METABOLIC PANEL WITH GFR
Anion gap: 10 (ref 5–15)
BUN: 9 mg/dL (ref 8–23)
CO2: 21 mmol/L — ABNORMAL LOW (ref 22–32)
Calcium: 8 mg/dL — ABNORMAL LOW (ref 8.9–10.3)
Chloride: 105 mmol/L (ref 98–111)
Creatinine, Ser: 0.75 mg/dL (ref 0.44–1.00)
GFR, Estimated: >60 mL/min (ref >60)
Glucose, Bld: 85 mg/dL (ref 70–99)
Potassium: 3.6 mmol/L (ref 3.5–5.1)
Sodium: 136 mmol/L (ref 135–145)

## 2024-01-12 LAB — CBC
HCT: 32.2 % — ABNORMAL LOW (ref 36.0–46.0)
Hemoglobin: 10.6 g/dL — ABNORMAL LOW (ref 12.0–15.0)
MCH: 27.5 pg (ref 26.0–34.0)
MCHC: 32.9 g/dL (ref 30.0–36.0)
MCV: 83.6 fL (ref 80.0–100.0)
Platelets: 240 K/uL (ref 150–400)
RBC: 3.85 MIL/uL — ABNORMAL LOW (ref 3.87–5.11)
RDW: 14.9 % (ref 11.5–15.5)
WBC: 6.9 K/uL (ref 4.0–10.5)
nRBC: 0 % (ref 0.0–0.2)

## 2024-01-12 LAB — TSH: TSH: 4.917 u[IU]/mL — ABNORMAL HIGH (ref 0.350–4.500)

## 2024-01-12 MED ORDER — IOHEXOL 350 MG/ML SOLN
65.0000 mL | Freq: Once | INTRAVENOUS | Status: AC | PRN
Start: 2024-01-12 — End: 2024-01-12
  Administered 2024-01-12: 65 mL via INTRAVENOUS

## 2024-01-12 NOTE — Evaluation (Signed)
 Physical Therapy Evaluation Patient Details Name: [name] MRN: [id number] DOB: [birth year] Today's Date: 01/12/2024  History of Present Illness  Pt is 89 yo female who presents on 01/10/24 after fall at home which was preceded by dizziness and passing out and followed by R foot and R shoulder pain. Found to have splenic laceration, small subscapular hematoma, and new onset A-fib. Suspected nondisplaced fx of R medial malleolus and distal tibia. PMH: HTN, HLD, chronic L1 compression fx, R thyroid  nodule, RA  Clinical Impression  Pt admitted with above diagnosis. Pt from home with family where she has had several falls. Pt lives with husband but family nearby and can help. Due to NWB RLE and arthritis BUEs, pt is going to need to be transfers only until WB staus is advanced. Thus she will need w/c for mobility. Pt taught to perform squat pivot to chair to simulate w/c transfer. Pt was unable to hop on LLE in standing with RW. Recommend HHPT and 24/7 supervision.  Pt currently with functional limitations due to the deficits listed below (see PT Problem List). Pt will benefit from acute skilled PT to increase their independence and safety with mobility to allow discharge.           If plan is discharge home, recommend the following: A little help with walking and/or transfers;A little help with bathing/dressing/bathroom;Assistance with cooking/housework;Assist for transportation   Can travel by private Programmer, multimedia (measurements PT);Wheelchair cushion (measurements PT)  Recommendations for Other Services       Functional Status Assessment Patient has had a recent decline in their functional status and demonstrates the ability to make significant improvements in function in a reasonable and predictable amount of time.     Precautions / Restrictions Precautions Precautions: Fall Recall of Precautions/Restrictions: Intact Precaution/Restrictions  Comments: has had multiple falls Required Braces or Orthoses: Splint/Cast Splint/Cast: ankle splint Splint/Cast - Date Prophylactic Dressing Applied (if applicable): 01/11/24 Restrictions Weight Bearing Restrictions Per Provider Order: Yes RLE Weight Bearing Per Provider Order: Non weight bearing      Mobility  Bed Mobility Overal bed mobility: Needs Assistance Bed Mobility: Supine to Sit     Supine to sit: Min assist     General bed mobility comments: Increased time and use of bed rails. Pt required support to RLE during bed mobility.    Transfers Overall transfer level: Needs assistance Equipment used: Rolling walker (2 wheels) Transfers: Sit to/from Stand, Bed to chair/wheelchair/BSC Sit to Stand: Mod assist, +2 safety/equipment     Squat pivot transfers: Min assist, +2 safety/equipment, +2 physical assistance     General transfer comment: pt able to stand to RW with PT keeping RLE NWB since pt kept trying to put it on floor. Pt unable to hop on LLE due to insufficient upper body strength to take full wt through UEs on RW. Pt was able to perform squat pivot to L with min A +2    Ambulation/Gait               General Gait Details: pt will need to be transfer level until WB status advanced  Stairs            Wheelchair Mobility     Tilt Bed    Modified Rankin (Stroke Patients Only)       Balance Overall balance assessment: Needs assistance Sitting-balance support: No upper extremity supported, Feet supported Sitting balance-Leahy Scale: Fair     Standing  balance support: Reliant on assistive device for balance, Bilateral upper extremity supported, During functional activity Standing balance-Leahy Scale: Poor Standing balance comment: needs UE support and external assist                             Pertinent Vitals/Pain Pain Assessment Pain Assessment: No/denies pain    Home Living Family/patient expects to be discharged to::  Private residence Living Arrangements: Spouse/significant other;Children Available Help at Discharge: Family;Available 24 hours/day Type of Home: House Home Access: Level entry     Alternate Level Stairs-Number of Steps: 2 steps to her bedroom Home Layout: Multi-level Home Equipment: Shower seat - built in;Hand held shower head;Standard Buyer, retail Comments: lives with husband, other family nearby    Prior Function Prior Level of Function : Independent/Modified Independent             Mobility Comments: ambulates with std walker ADLs Comments: daughter cooks and cleans, has never driven, sponge bathes, dresses self     Extremity/Trunk Assessment   Upper Extremity Assessment Upper Extremity Assessment: Defer to OT evaluation;Right hand dominant (B hands with arthritic deformity)    Lower Extremity Assessment Lower Extremity Assessment: RLE deficits/detail RLE Deficits / Details: R ankle splinted, Hip flex >3/5, knee ext >3/5 RLE Sensation: WNL RLE Coordination: decreased gross motor    Cervical / Trunk Assessment Cervical / Trunk Assessment: Normal  Communication   Communication Communication: No apparent difficulties Factors Affecting Communication: Non - English speaking, interpreter not available Garment/textile technologist and son available for session for interpretation)    Cognition Arousal: Alert Behavior During Therapy: Impulsive   PT - Cognitive impairments: Difficult to assess Difficult to assess due to: Non-English speaking                     PT - Cognition Comments: pt AxOx4 and neither family nor translator mention cognition issues but pt used to being independent and is quite impulsive as well as having difficulty fully understanding that she cannot put wt through RLE Following commands: Impaired Following commands impaired: Follows one step commands inconsistently (needs tactile cues)     Cueing Cueing Techniques: Verbal cues, Gestural cues,  Tactile cues, Visual cues     General Comments General comments (skin integrity, edema, etc.): BP 137/112 in supine, quesionably motion degraded. Seated EOB 123/71 (87) HR 63 bpm. STanding 110/73 (87) HR 111 bpm, end of session in chair, BP 112/57 (71) HR 66 bpm    Exercises     Assessment/Plan    PT Assessment Patient needs continued PT services  PT Problem List Decreased balance;Decreased mobility;Decreased cognition;Decreased knowledge of use of DME;Decreased safety awareness;Decreased knowledge of precautions;Decreased activity tolerance       PT Treatment Interventions DME instruction;Gait training;Functional mobility training;Therapeutic activities;Therapeutic exercise;Balance training;Neuromuscular re-education;Cognitive remediation;Patient/family education    PT Goals (Current goals can be found in the Care Plan section)  Acute Rehab PT Goals Patient Stated Goal: return home PT Goal Formulation: With patient Time For Goal Achievement: 01/26/24 Potential to Achieve Goals: Good    Frequency Min 2X/week     Co-evaluation PT/OT/SLP Co-Evaluation/Treatment: Yes Reason for Co-Treatment: To address functional/ADL transfers;For patient/therapist safety (maintaining WB precautions) PT goals addressed during session: Mobility/safety with mobility;Balance;Proper use of DME OT goals addressed during session: Proper use of Adaptive equipment and DME       AM-PAC PT 6 Clicks Mobility  Outcome Measure Help needed turning from your back to your side  while in a flat bed without using bedrails?: A Little Help needed moving from lying on your back to sitting on the side of a flat bed without using bedrails?: A Little Help needed moving to and from a bed to a chair (including a wheelchair)?: A Little Help needed standing up from a chair using your arms (e.g., wheelchair or bedside chair)?: A Lot Help needed to walk in hospital room?: Total Help needed climbing 3-5 steps with a  railing? : Total 6 Click Score: 13    End of Session Equipment Utilized During Treatment: Gait belt Activity Tolerance: Patient tolerated treatment well Patient left: in chair;with call bell/phone within reach;with chair alarm set;with family/visitor present Nurse Communication: Mobility status PT Visit Diagnosis: Unsteadiness on feet (R26.81);History of falling (Z91.81);Difficulty in walking, not elsewhere classified (R26.2)    Time: 9064-8991 PT Time Calculation (min) (ACUTE ONLY): 33 min   Charges:   PT Evaluation $PT Eval Moderate Complexity: 1 Mod   PT General Charges $$ ACUTE PT VISIT: 1 Visit         Richerd Lipoma, PT  Acute Rehab Services Secure chat preferred Office [phone]   Richerd CROME Rosamund Nyland 01/12/2024, 12:29 PM

## 2024-01-12 NOTE — Progress Notes (Signed)
 Subjective: CC: Family at bedside. Spanish interpreter was offered and declined. Family used for spanish interpreter.   Patient reports no abdominal pain. Tolerating regular diet without n/v. No flatus or bm. Voiding without issues. Has not been oob.   Afebrile. No tachycardia or systolic hypotension. Hgb stable on last check at 10.5 on last check. CBC pending this AM.   Objective: Vital signs in last 24 hours: Temp:  [98.1 F (36.7 C)-98.5 F (36.9 C)] 98.2 F (36.8 C) (09/23 0718) Pulse Rate:  [72-75] 74 (09/23 0718) Resp:  [16-18] 16 (09/23 0718) BP: (111-117)/(53-66) 113/56 (09/23 0718) SpO2:  [95 %-98 %] 96 % (09/23 0718) Last BM Date : 01/09/24  Intake/Output from previous day: 09/22 0701 - 09/23 0700 In: 240 [P.O.:240] Out: 550 [Urine:550] Intake/Output this shift: No intake/output data recorded.  PE: Gen:  Alert, NAD, pleasant Card:  Reg rate Pulm:  CTAB, no W/R/R, effort normal Abd: Soft, ND, NT Ext:  R ankle splint in place - wiggles digits, wwp. No LLE edema  Lab Results:  Recent Labs    01/11/24 0654 01/11/24 2006  WBC 7.0 7.4  HGB 9.7* 10.5*  HCT 29.5* 32.1*  PLT 226 257   BMET Recent Labs    01/10/24 0131 01/11/24 0654  NA 134* 132*  K 4.1 3.8  CL 99 103  CO2 21* 20*  GLUCOSE 116* 92  BUN 25* 8  CREATININE 0.95 0.70  CALCIUM  9.0 7.7*   PT/INR Recent Labs    01/11/24 0654  LABPROT 16.2*  INR 1.2   CMP     Component Value Date/Time   NA 132 (L) 01/11/2024 0654   NA 140 01/23/2023 1138   K 3.8 01/11/2024 0654   CL 103 01/11/2024 0654   CO2 20 (L) 01/11/2024 0654   GLUCOSE 92 01/11/2024 0654   BUN 8 01/11/2024 0654   BUN 15 01/23/2023 1138   CREATININE 0.70 01/11/2024 0654   CREATININE 0.86 11/13/2021 1115   CALCIUM  7.7 (L) 01/11/2024 0654   PROT 5.7 (L) 01/11/2024 0654   PROT 6.6 01/23/2023 1138   ALBUMIN 2.6 (L) 01/11/2024 0654   ALBUMIN 3.8 01/23/2023 1138   AST 18 01/11/2024 0654   ALT 13 01/11/2024 0654    ALKPHOS 66 01/11/2024 0654   BILITOT 0.9 01/11/2024 0654   BILITOT 0.5 01/23/2023 1138   GFRNONAA >60 01/11/2024 0654   GFRNONAA 73 04/26/2018 1005   GFRAA 59 (L) 12/28/2019 1530   GFRAA 84 04/26/2018 1005   Lipase  No results found for: LIPASE  Studies/Results: MR BRAIN WO CONTRAST Addendum Date: 01/11/2024 ADDENDUM REPORT: 01/11/2024 19:49 ADDENDUM: Finding omitted from impression: Signal abnormality within the left vertebral artery V3 segment suggesting high-grade stenosis or vessel occlusion. CT or MR angiography may be obtained for further evaluation, as clinically warranted. These results will be called to the ordering clinician or representative by the Radiologist Assistant, and communication documented in the PACS or [employer]. Electronically Signed   By: Rockey Childs D.O.   On: 01/11/2024 19:49   Result Date: 01/11/2024 CLINICAL DATA:  Provided history: Mental status change, unknown cause. EXAM: MRI HEAD WITHOUT CONTRAST TECHNIQUE: Multiplanar, multiecho pulse sequences of the brain and surrounding structures were obtained without intravenous contrast. COMPARISON:  Head CT 01/10/2024. Brain MRI 02/21/2015. MRA head 02/20/2015. FINDINGS: Brain: Mild generalized cerebral atrophy. Multifocal T2 FLAIR hyperintense signal abnormality within the cerebral white matter, nonspecific but compatible with mild chronic small vessel ischemic disease. Several chronic  microhemorrhages within the supratentorial brain and cerebellum (with a thalamic predominance), likely reflecting sequelae of chronic hypertensive microangiopathy. There is no acute infarct. No evidence of an intracranial mass. No extra-axial fluid collection. No midline shift. Vascular: Signal abnormality within the left vertebral artery V3 segments suggesting high-grade stenosis or vessel occlusion. Flow voids preserved elsewhere within the proximal large arterial vessels. Skull and upper cervical spine: No focal worrisome marrow  lesion. Sinuses/Orbits: No mass or acute finding within the imaged orbits. Prior bilateral ocular lens replacement. Small fluid level within the right sphenoid sinus. Other: Small-volume fluid within right mastoid air cells. IMPRESSION: 1. No evidence of an acute intracranial abnormality. 2. Mild chronic small vessel ischemic changes within the cerebral white matter, slightly progressed from the prior MRI of 02/21/2015. 3. Several chronic microhemorrhages within the supratentorial brain and cerebellum, with a distribution suggesting sequelae of chronic hypertensive microangiopathy. 4. Mild generalized cerebral atrophy. 5. Mild right sphenoid sinus disease. 6. Small right mastoid effusion. Electronically Signed: By: Rockey Childs D.O. On: 01/11/2024 19:43    Anti-infectives: Anti-infectives (From admission, onward)    None        Assessment/Plan GLF, syncopal episode G2 spleen laceration - HDS, hgb stable on last check. CBC pending this AM. Off bedrest. Mobilize. Okay for regular diet.  R ankle fx - Per Ortho, they recommend splint and NWB, f/u in 1 weeks for xrays.   - Per [facility] -  Syncope - cardiology following. MRI brain done. Defer if any further imaging needed to primary team.  New onset A. Fib  R thyroid  nodule Hypodensities of the liver on CT Chronic L1 compression fx HTN HLD   FEN - Reg, IVF per [facility] VTE - SCDs, hold therapeutic and chem ppx given splenic laceration  ID - None Foley - None, spont void. UA pending.  Plan - Monitor hgb, mobilize with PT  I reviewed nursing notes, Consultant (Ortho, Cards) notes, hospitalist notes, last 24 h vitals and pain scores, last 48 h intake and output, last 24 h labs and trends, and last 24 h imaging results.    LOS: 2 days    [name], [facility] 01/12/2024, 9:57 AM Please see Amion for pager number during day hours 7:00am-4:30pm

## 2024-01-12 NOTE — Progress Notes (Signed)
 [facility] Progress Note Patient: [name] FMW:[id number] DOB: 21-Jan-1933  DOA: 01/10/2024 DOS: the patient was seen and examined on 01/12/2024  Brief Hospital Course: Elverna Caffee is a 89 y.o. female with medical history significant of HTN, HLD who presents after syncopal episode. Patient is Spanish speaking and her family are at bedside to translate.  Presented with a fall and unwitnessed event of syncope. Trauma surgery was consulted. Cardiology consulted.  Assessment and Plan: Syncope and collapse. Unwitnessed fall. Suspected to be vasovagal due to poor p.o. intake. Have history of moderate aortic stenosis and was actually also found to be in A-fib which is new. On my examination some of her fatigue, right facial droop and poor attention. CT head unremarkable. CT maxillary spine unremarkable. MRI brain negative for acute stroke but shows incidentally vertebral artery stenosis on the left. CTA head and neck was recommended which was performed, shows vertebral to stenosis.  No evidence of dissection. Once the patient is able to take anticoagulation, patient will be on Eliquis which would help treat the condition. Appreciate cardiology consultation. Monitor on telemetry.  Will require outpatient monitor. Remains in A-fib but rate controlled. Most likely will be able to discharge home tomorrow as long as remains medically stable   New onset A-fib Rate controlled Echocardiogram was recently completed 01/06/2024 which revealed EF 55 to 60%, normal diastolic parameter, mild to moderate aortic stenosis, mild mitral regurgitation Cardiology consulted.  Outpatient follow-up recommended.    Splenic laceration Trauma surgery consulted Currently on regular diet.  H&H relatively stable with dilutional component. Avoiding anticoagulation due to splenic laceration until seen by cardiology.   Right medial malleolus fracture Orthopedic surgery consulted Currently recommending  conservative management. Nonweightbearing on right lower extremity.   Thyroid  nodule  incidental finding on CT 1.6 cm heterogeneous solid nodule right lobe of the thyroid  gland. This would ordinarily warrant a nonemergent ultrasound follow-up recommendation, but this should only be done if clinically warranted given her advanced age.   HTN Hold Norvasc     HLD Hold Lipitor   Aortic stenosis. Mild to moderate. Appreciate cardiology consultation.   Subjective: No nausea no vomiting no fever no chills. No Chest pain.  Physical Exam: Clear to auscultation. S1-S2 present Bowel sound present. Nontender. No edema. Pupils are equal and reactive to light.  Alert awake and oriented.  No asterixis.  No focal deficit.  Data Reviewed: I have Reviewed nursing notes, Vitals, and Lab results. Since last encounter, pertinent lab results CBC and BMP   . I have ordered test including CBC and BMP  . I have ordered imaging MRI brain  .  Discussed with cardiology.  Disposition: Status is: Inpatient Remains inpatient appropriate because: Monitor for improvement in H&H.  PT OT recommended home therapy.  SCDs Start: 01/10/24 0807   Family Communication: Family at bedside Level of care: Telemetry Medical   Vitals:   01/12/24 0440 01/12/24 0718 01/12/24 1508 01/12/24 1940  BP: 111/60 (!) 113/56 (!) 112/59 111/61  Pulse: 72 74 77 (!) 48  Resp: 18 16 16 15   Temp: 98.4 F (36.9 C) 98.2 F (36.8 C) 98.1 F (36.7 C) 98.9 F (37.2 C)  TempSrc:  Oral  Oral  SpO2: 96% 96% 95% 93%  Weight:      Height:         Author: Yetta Blanch, MD 01/12/2024 8:25 PM  Please look on [email or web address] to find out who is on call.

## 2024-01-12 NOTE — Evaluation (Signed)
 Occupational Therapy Evaluation Patient Details Name: [name] MRN: [id number] DOB: 1932/04/22 Today's Date: 01/12/2024   History of Present Illness   Pt is 89 yo female who presents on 01/10/24 after fall at home which was preceded by dizziness and passing out and followed by R foot and R shoulder pain. Found to have splenic laceration, small subscapular hematoma, and new onset A-fib. Suspected nondisplaced fx of R medial malleolus and distal tibia. PMH: HTN, HLD, chronic L1 compression fx, R thyroid  nodule     Clinical Impressions Pt reports living with her spouse in a house with two steps to get to her bedroom. Pt stated that family lives next door and is available for support as needed. Pt son at bedsides reports that he has recently built a walk-in shower with a built-in [place] at Pt home and will be able to construct a ramp in place of the two stairs to her bedroom. Pt son stated that Pt has many family members to support her during this time. Pt is currently requiring Mod A. +2 for sit to stand transfers and Min A. +2 for squat pivot transfer. Pt with decreased ability to recall and adhere to RLE NWB precautions even with increased verbal and tactile cues. Pt with notable impulsivity in session, requiring frequent verbal and gestures to not get up on her own to maintain patient safety. Orthostatics taken during session. Supine 137/112 (121) HR 60 questionably motion degraded as pt with consistent soft Bps at rest ~113/56 earlier today; seated EOB 123/71 (87) HR 63; standing110/73 (87) HR 111 with pt supporting self with BUE on RW; end of session 112/57 (71) HR 66. Pt demonstrates the above noted functional limitations and indicates a need for continued skilled OT services in acute and post acute care to maximize functional abilities and decrease burden of care. OT to recommend Home Health OT follow-up.       If plan is discharge home, recommend the following:   A lot of help with walking  and/or transfers;A little help with bathing/dressing/bathroom;Assistance with cooking/housework;Help with stairs or ramp for entrance     Functional Status Assessment   Patient has had a recent decline in their functional status and demonstrates the ability to make significant improvements in function in a reasonable and predictable amount of time.     Equipment Recommendations   Wheelchair (measurements OT)     Recommendations for Other Services         Precautions/Restrictions   Precautions Precautions: Fall Restrictions Weight Bearing Restrictions Per Provider Order: Yes RLE Weight Bearing Per Provider Order: Non weight bearing (RLE)     Mobility Bed Mobility Overal bed mobility: Modified Independent             General bed mobility comments: Increased time and use of bed rails. Pt required support to RLE during bed mobility.    Transfers Overall transfer level: Needs assistance Equipment used: Rolling walker (2 wheels) (gait belt) Transfers: Sit to/from Stand, Bed to chair/wheelchair/BSC Sit to Stand: Mod assist, +2 safety/equipment   Squat pivot transfers: Min assist, +2 safety/equipment, +2 physical assistance       General transfer comment: Pt requires continuous multimodal cueing for adherence to NWB precautions of RLE.      Balance Overall balance assessment: Needs assistance Sitting-balance support:  (Pt able to maintain static sitting balance at EOB)       Standing balance support: Reliant on assistive device for balance (Pt relying on RW for UE support iin STS  transfer. Pt required RLE support to not bear weaight through leg.)                               ADL either performed or assessed with clinical judgement   ADL Overall ADL's : Needs assistance/impaired Eating/Feeding: Set up   Grooming: Set up;Sitting   Upper Body Bathing: Set up;Sitting   Lower Body Bathing: Minimal assistance;Sitting/lateral leans;Cueing for  safety   Upper Body Dressing : Set up;Sitting   Lower Body Dressing: Minimal assistance;Moderate assistance;Cueing for safety;Sitting/lateral leans (Pt required Mod A for donninh L shoe.)   Toilet Transfer: Minimal assistance;Squat-pivot;+2 for safety/equipment;Cueing for sequencing;BSC/3in1 Toilet Transfer Details (indicate cue type and reason): simulated toilet transfer to recliner. Pt required Min A +2 for squat pivot to chair to maintain NWB precautions of RLE. Toileting- Clothing Manipulation and Hygiene: Minimal assistance;Set up;Sitting/lateral lean (RA in bilateral hands)       Functional mobility during ADLs: Minimal assistance;Moderate assistance;+2 for safety/equipment;Cueing for safety;Rolling walker (2 wheels);Cueing for sequencing General ADL Comments: Pt required Mod A. +2 for sit to stand transfer and Min A. +2 for squat pivot transfer. Pt demonstrated decreased safety awareness and impulsivity with ADL transfers and required consistent redirection to wait to initiate task. Pt with significant RA in bilateral hands, further assessment would be beneficial to address manipulation and amnagement of ADL supplies. Pt with decreased ability to recall NWB precautions at end of session, requiring additional explicit vebalizations of precautions. Pt able to verbal precautions at end of session.     Vision Baseline Vision/History: 1 Wears glasses (reading) Vision Assessment?: Wears glasses for reading     Perception Perception: Not tested       Praxis Praxis: Not tested       Pertinent Vitals/Pain Pain Assessment Pain Assessment: No/denies pain (Pt reported no pain in RLE)     Extremity/Trunk Assessment Upper Extremity Assessment Upper Extremity Assessment: Right hand dominant (Pt with RA in bilateral hands impacting functional grasp on RW.)   Lower Extremity Assessment Lower Extremity Assessment: Defer to PT evaluation   Cervical / Trunk Assessment Cervical / Trunk  Assessment: Normal   Communication Communication Communication: No apparent difficulties Factors Affecting Communication: Non - English speaking, interpreter not available Garment/textile technologist and son available for session for interpretation)   Cognition Arousal: Alert Behavior During Therapy: WFL for tasks assessed/performed Cognition: No apparent impairments             OT - Cognition Comments: Pt with decreased ability to recall WB precautions. Assumed Pt is at baseline cognitive state.                 Following commands: Intact       Cueing  General Comments   Cueing Techniques: Verbal cues;Gestural cues;Tactile cues;Visual cues      Exercises     Shoulder Instructions      Home Living Family/patient expects to be discharged to:: Private residence Living Arrangements: Spouse/significant other;Children Available Help at Discharge: Family;Available 24 hours/day Type of Home: House Home Access: Level entry     Home Layout: Multi-level Alternate Level Stairs-Number of Steps: 2 steps to her bedroom Alternate Level Stairs-Rails: Right;Left Bathroom Shower/Tub: Producer, television/film/video: Standard     Home Equipment: Shower seat - built in;Hand held shower head;Standard Warden/ranger Comments: lives with husband, other family nearby      Prior Functioning/Environment Prior Level of Function :  Independent/Modified Independent             Mobility Comments: ambulates with std walker ADLs Comments: daughter cooks and cleans, has never driven, sponge bathes, dresses self    OT Problem List: Decreased knowledge of precautions;Decreased knowledge of use of DME or AE;Decreased safety awareness   OT Treatment/Interventions: Self-care/ADL training;DME and/or AE instruction;Therapeutic activities;Balance training;Patient/family education;Therapeutic exercise;Energy conservation      OT Goals(Current goals can be found in the care plan section)    Acute Rehab OT Goals Patient Stated Goal: Pt would like to get better OT Goal Formulation: With patient Time For Goal Achievement: 01/26/24 Potential to Achieve Goals: Good ADL Goals Pt Will Perform Grooming: sitting;with supervision Pt Will Perform Lower Body Dressing: with modified independence;sitting/lateral leans Pt Will Perform Toileting - Clothing Manipulation and hygiene: with modified independence;sitting/lateral leans Pt Will Perform Tub/Shower Transfer: with modified independence;shower seat;Squat pivot transfer Additional ADL Goal #1: Pt will adhere to RLE NWB precautions 100% of the time.   OT Frequency:  Min 2X/week    Co-evaluation PT/OT/SLP Co-Evaluation/Treatment: Yes Reason for Co-Treatment: To address functional/ADL transfers;For patient/therapist safety (maintaining WB precautions)   OT goals addressed during session: Proper use of Adaptive equipment and DME      AM-PAC OT 6 Clicks Daily Activity     Outcome Measure Help from another person eating meals?: None (set up) Help from another person taking care of personal grooming?: None (set up) Help from another person toileting, which includes using toliet, bedpan, or urinal?: A Little (min-mod) Help from another person bathing (including washing, rinsing, drying)?: A Little (min - mod) Help from another person to put on and taking off regular upper body clothing?: None (set up) Help from another person to put on and taking off regular lower body clothing?: A Little (min-mod) 6 Click Score: 21   End of Session Equipment Utilized During Treatment: Gait belt;Rolling walker (2 wheels)  Activity Tolerance: Patient tolerated treatment well Patient left: in chair;with chair alarm set;with call bell/phone within reach;with family/visitor present  OT Visit Diagnosis: Unsteadiness on feet (R26.81);Repeated falls (R29.6)                Time: 9063-8998 OT Time Calculation (min): 25 min Charges:  OT General  Charges $OT Visit: 1 Visit OT Evaluation $OT Eval Moderate Complexity: 1 Mod  Maurilio Peek, OTR/L.  [facility] Acute Rehabilitation  Office: [phone]  Maurilio PARAS Avedis Bevis 01/12/2024, 11:22 AM

## 2024-01-12 NOTE — Progress Notes (Signed)
     Subjective:  Patient reports pain as mild.  She has been nonweightbearing splint.  Her son is at bedside.  Has not yet been out of bed.  Denies pain in other joints or extremities.  Son reports that she has had chronic issues with the right foot chronic deformity.  Yesterday's total administered Morphine Milligram Equivalents: 5   Objective:   VITALS:   Vitals:   01/11/24 0732 01/11/24 1423 01/11/24 2013 01/12/24 0440  BP: (!) 113/59 (!) 117/53 117/66 111/60  Pulse: 74 75 74 72  Resp: 17 16 18 18   Temp: 97.6 F (36.4 C) 98.5 F (36.9 C) 98.1 F (36.7 C) 98.4 F (36.9 C)  TempSrc: Oral Oral    SpO2: 96% 95% 98% 96%  Weight:      Height:        Sensation intact distally Intact pulses distally Dorsiflexion/Plantar flexion intact Splint clean, dry, and intact  Lab Results  Component Value Date   WBC 7.4 01/11/2024   HGB 10.5 (L) 01/11/2024   HCT 32.1 (L) 01/11/2024   MCV 84.3 01/11/2024   PLT 257 01/11/2024   BMET    Component Value Date/Time   NA 132 (L) 01/11/2024 0654   NA 140 01/23/2023 1138   K 3.8 01/11/2024 0654   CL 103 01/11/2024 0654   CO2 20 (L) 01/11/2024 0654   GLUCOSE 92 01/11/2024 0654   BUN 8 01/11/2024 0654   BUN 15 01/23/2023 1138   CREATININE 0.70 01/11/2024 0654   CREATININE 0.86 11/13/2021 1115   CALCIUM  7.7 (L) 01/11/2024 0654   EGFR 72 01/23/2023 1138   GFRNONAA >60 01/11/2024 0654   GFRNONAA 73 04/26/2018 1005      Xray: Xrays and CT scan reviewed demonstrate nondisplaced bimalleolar ankle fracture good alignment.  Chronic subtalar ankylosis with planovalgus alignment  Assessment/Plan:     Principal Problem:   Syncope, vasovagal Active Problems:   HTN (hypertension)   Thyroid  nodule   Dyslipidemia   Spleen laceration, sequela   Atrial fibrillation (HCC)   Nondisplaced fracture of medial malleolus of right tibia, initial encounter for closed fracture   Vasovagal syncope  Right bimalleolar ankle  fracture  Discussed with patient and her son today risks and benefits of surgical versus nonsurgical treatment.  Currently fracture is completely nondisplaced.  Given her age and health she would be at high risk for wound healing complications of infection with surgical treatment.  Would also still have to be nonweightbearing with surgical intervention.  Will plan to treat the fracture nonoperatively and anticipate good healing.  She should be nonweightbearing in the splint.  Will recheck x-rays in 1 week. Follow-up in the office.    Olanna Percifield A Kiernan Farkas 01/12/2024, 6:52 AM   Toribio Higashi, MD  Contact information:   [phone] 7am-5pm epic message Dr. Higashi, or call office for patient follow up: [phone] After hours and holidays please check [email or web address] for group call information for [facility]

## 2024-01-13 ENCOUNTER — Telehealth: Payer: Self-pay | Admitting: Cardiology

## 2024-01-13 ENCOUNTER — Inpatient Hospital Stay (HOSPITAL_COMMUNITY)
Admit: 2024-01-13 | Discharge: 2024-01-13 | Disposition: A | Discharge status: Home / Self Care | Attending: Physician Assistant | Admitting: Physician Assistant

## 2024-01-13 DIAGNOSIS — R55 Syncope and collapse: Secondary | ICD-10-CM | POA: Diagnosis not present

## 2024-01-13 DIAGNOSIS — I480 Paroxysmal atrial fibrillation: Secondary | ICD-10-CM

## 2024-01-13 LAB — CBC
HCT: 30.9 % — ABNORMAL LOW (ref 36.0–46.0)
Hemoglobin: 10.4 g/dL — ABNORMAL LOW (ref 12.0–15.0)
MCH: 28 pg (ref 26.0–34.0)
MCHC: 33.7 g/dL (ref 30.0–36.0)
MCV: 83.1 fL (ref 80.0–100.0)
Platelets: 250 K/uL (ref 150–400)
RBC: 3.72 MIL/uL — ABNORMAL LOW (ref 3.87–5.11)
RDW: 14.8 % (ref 11.5–15.5)
WBC: 6.2 K/uL (ref 4.0–10.5)
nRBC: 0 % (ref 0.0–0.2)

## 2024-01-13 MED ORDER — ENOXAPARIN SODIUM 30 MG/0.3ML IJ SOSY
30.0000 mg | PREFILLED_SYRINGE | Freq: Two times a day (BID) | INTRAMUSCULAR | Status: DC
  Administered 2024-01-13 – 2024-01-14 (×3): 30 mg via SUBCUTANEOUS
  Filled 2024-01-13 (×3): qty 0.3

## 2024-01-13 MED ORDER — MIRTAZAPINE 15 MG PO TABS
7.5000 mg | ORAL_TABLET | Freq: Every day | ORAL | Status: DC
Start: 2024-01-13 — End: 2024-01-14
  Administered 2024-01-13: 7.5 mg via ORAL
  Filled 2024-01-13: qty 1

## 2024-01-13 MED ORDER — DRONABINOL 2.5 MG PO CAPS: 2.5000 mg | ORAL_CAPSULE | Freq: Two times a day (BID) | ORAL | Status: DC

## 2024-01-13 NOTE — Telephone Encounter (Signed)
 Afib is known for patient. HR reported normal prr phone note. No specific action needed. Pending completion of monitor to look for more significant pauses or arrhythmias.

## 2024-01-13 NOTE — Plan of Care (Signed)
   Problem: Education: Goal: Knowledge of General Education information will improve Description: Including pain rating scale, medication(s)/side effects and non-pharmacologic comfort measures Outcome: Progressing   Problem: Activity: Goal: Risk for activity intolerance will decrease Outcome: Progressing   Problem: Nutrition: Goal: Adequate nutrition will be maintained Outcome: Progressing

## 2024-01-13 NOTE — Progress Notes (Signed)
 PROGRESS NOTE    [name]  FMW:[id number] DOB: July 10, 1932 DOA: 01/10/2024 PCP: Dettinger, Fonda LABOR, MD   Brief Narrative:  This 89 y.o. female with medical history significant of HTN, HLD who presents after syncopal episode. Patient is Spanish speaking and her family are at bedside to translate. She presented with s/p fall and unwitnessed event of syncope. Trauma surgery was consulted. Cardiology consulted.  Patient was admitted for further evaluation.  Assessment & Plan:   Principal Problem:   Syncope, vasovagal Active Problems:   HTN (hypertension)   Thyroid  nodule   Dyslipidemia   Spleen laceration, sequela   Atrial fibrillation (HCC)   Nondisplaced fracture of medial malleolus of right tibia, initial encounter for closed fracture   Vasovagal syncope   Syncope and collapse: Unwitnessed fall. Suspected to be vasovagal due to poor p.o. intake. She does have history of moderate aortic stenosis and was actually also found to be in A-fib which is new. She was noted to have fatigue, right facial droop and poor attention. CT head unremarkable. CT maxillary spine unremarkable. MRI brain negative for acute stroke but shows incidentally vertebral artery stenosis on the left side. CTA head and neck was recommended which was performed, shows vertebral to stenosis.  No evidence of dissection. Once the patient is able to take anticoagulation, Patient will be on Eliquis which would help treat the condition. Appreciate cardiology consultation. Monitor on telemetry.  Will require outpatient monitor. Remains in A-fib but Heart rate controlled. Most likely will be able to discharge home as long as remains medically stable.   New onset A-fib: Heart rate controlled Echo recently completed 01/06/2024 which revealed EF 55 to 60%, normal diastolic parameter, mild to moderate aortic stenosis, mild mitral regurgitation Cardiology consulted.  Outpatient follow-up recommended.    Splenic  laceration: Trauma surgery consulted. Currently on regular diet.  H&H relatively stable with dilutional component. Avoiding anticoagulation due to splenic laceration until seen by cardiology. Surgery recommended starting DVT prophylaxis with Lovenox  30 mg twice daily today.   Right medial malleolus fracture: Orthopedic surgery consulted Currently recommending conservative management. Nonweightbearing on right lower extremity.   Thyroid  nodule  incidental finding on CT 1.6 cm heterogeneous solid nodule right lobe of the thyroid  gland. This would ordinarily warrant a nonemergent ultrasound follow-up recommendation, but this should only be done if clinically warranted given her advanced age.   HTN: Hold Norvasc     HLD: Hold Lipitor    Aortic stenosis. Mild to moderate. Appreciate cardiology consultation.    DVT prophylaxis: Lovenox  Code Status: Full code Family Communication: Family at bedside. Disposition Plan:    Status is: Inpatient Remains inpatient appropriate because: Admitted for syncopal episode head splenic laceration, and cardiology following.    Consultants:  General surgery,Cardiology  Procedures: None  Antimicrobials: Anti-infectives (From admission, onward)    None       Subjective: Patient was seen and examined at bedside.  Overnight events noted. Patient states she is feeling better and wants to be discharged.   She appears very deconditioned and needs PT and OT evaluation.  Objective: Vitals:   01/12/24 1508 01/12/24 1940 01/13/24 0418 01/13/24 0740  BP: (!) 112/59 111/61 123/62 122/69  Pulse: 77 (!) 48 85 74  Resp: 16 15 16 17   Temp: 98.1 F (36.7 C) 98.9 F (37.2 C) 98.1 F (36.7 C) 97.8 F (36.6 C)  TempSrc:  Oral Oral   SpO2: 95% 93% 97% 98%  Weight:      Height:  Intake/Output Summary (Last 24 hours) at 01/13/2024 1343 Last data filed at 01/13/2024 1100 Gross per 24 hour  Intake 240 ml  Output --  Net 240 ml   Filed  Weights   01/10/24 0631  Weight: 53.9 kg    Examination:  General exam: Appears calm and comfortable, not in any acute distress.  Deconditioned. Respiratory system: CTA Bilaterally. Respiratory effort normal.  RR 13 Cardiovascular system: S1 & S2 heard, RRR. No JVD, murmurs, rubs, gallops or clicks.  Gastrointestinal system: Abdomen is nondistended, soft and nontender. Normal bowel sounds heard. Central nervous system: Alert and oriented x 3. No focal neurological deficits. Extremities: No edema, no cyanosis, no clubbing. Skin: No rashes, lesions or ulcers Psychiatry: Judgement and insight appear normal. Mood & affect appropriate.     Data Reviewed: I have personally reviewed following labs and imaging studies  CBC: Recent Labs  Lab 01/10/24 0131 01/10/24 1033 01/10/24 2248 01/11/24 0654 01/11/24 2006 01/12/24 1110 01/13/24 0430  WBC 11.8*  --   --  7.0 7.4 6.9 6.2  NEUTROABS 9.7*  --   --   --   --   --   --   HGB 11.2*   < > 9.7* 9.7* 10.5* 10.6* 10.4*  HCT 33.6*   < > 28.7* 29.5* 32.1* 32.2* 30.9*  MCV 84.2  --   --  84.0 84.3 83.6 83.1  PLT 302  --   --  226 257 240 250   < > = values in this interval not displayed.   Basic Metabolic Panel: Recent Labs  Lab 01/10/24 0131 01/11/24 0654 01/12/24 1110  NA 134* 132* 136  K 4.1 3.8 3.6  CL 99 103 105  CO2 21* 20* 21*  GLUCOSE 116* 92 85  BUN 25* 8 9  CREATININE 0.95 0.70 0.75  CALCIUM  9.0 7.7* 8.0*   GFR: Estimated Creatinine Clearance: 36.1 mL/min (by C-G formula based on SCr of 0.75 mg/dL). Liver Function Tests: Recent Labs  Lab 01/10/24 0131 01/11/24 0654  AST 25 18  ALT 14 13  ALKPHOS 104 66  BILITOT 0.6 0.9  PROT 6.9 5.7*  ALBUMIN 3.8 2.6*   No results for input(s): LIPASE, AMYLASE in the last 168 hours. No results for input(s): AMMONIA in the last 168 hours. Coagulation Profile: Recent Labs  Lab 01/11/24 0654  INR 1.2   Cardiac Enzymes: No results for input(s): CKTOTAL, CKMB,  CKMBINDEX, TROPONINI in the last 168 hours. BNP (last 3 results) Recent Labs    01/10/24 0131  PROBNP 1,457.0*   HbA1C: No results for input(s): HGBA1C in the last 72 hours. CBG: No results for input(s): GLUCAP in the last 168 hours. Lipid Profile: No results for input(s): CHOL, HDL, LDLCALC, TRIG, CHOLHDL, LDLDIRECT in the last 72 hours. Thyroid  Function Tests: Recent Labs    01/12/24 0441  TSH 4.917*   Anemia Panel: No results for input(s): VITAMINB12, FOLATE, FERRITIN, TIBC, IRON, RETICCTPCT in the last 72 hours. Sepsis Labs: No results for input(s): PROCALCITON, LATICACIDVEN in the last 168 hours.  No results found for this or any previous visit (from the past 240 hours).   Radiology Studies: CT ANGIO HEAD NECK W WO CM Result Date: 01/12/2024 CLINICAL DATA:  Vertebral artery stenosis suspected. EXAM: CT ANGIOGRAPHY HEAD AND NECK WITH AND WITHOUT CONTRAST TECHNIQUE: Multidetector CT imaging of the head and neck was performed using the standard protocol during bolus administration of intravenous contrast. Multiplanar CT image reconstructions and MIPs were obtained to evaluate the vascular  anatomy. Carotid stenosis measurements (when applicable) are obtained utilizing NASCET criteria, using the distal internal carotid diameter as the denominator. RADIATION DOSE REDUCTION: This exam was performed according to the departmental dose-optimization program which includes automated exposure control, adjustment of the mA and/or kV according to patient size and/or use of iterative reconstruction technique. CONTRAST:  65mL OMNIPAQUE  IOHEXOL  350 MG/ML SOLN COMPARISON:  Head MRI 01/11/2024. Head MRA 02/20/2015. CT chest 01/10/2024. FINDINGS: CT HEAD FINDINGS Brain: There is no evidence of an acute infarct, intracranial hemorrhage, mass, midline shift, or extra-axial fluid collection. There is mild cerebral atrophy. Mildly prominent extra-axial CSF over the left  frontotemporal convexity and along the right interhemispheric fissure appears to reflect subarachnoid space enlargement from volume loss rather than small subdural hygromas based on MRI. Cerebral white matter hypodensities are nonspecific but compatible with mild chronic small vessel ischemic disease. Vascular: Calcified atherosclerosis at the skull base. Skull: No fracture or suspicious lesion. Sinuses/Orbits: Clear paranasal sinuses. Small right mastoid effusion. Bilateral cataract extraction. Other: None. Review of the MIP images confirms the above findings CTA NECK FINDINGS Aortic arch: Standard branching with mild-to-moderate atherosclerosis including a 1.1 cm focus of soft plaque along the lateral wall of the proximal descending thoracic aorta which is unchanged from a 01/10/2024 chest CT. Widely patent brachiocephalic and subclavian arteries. Right carotid system: Patent with scattered mixed plaque throughout the common carotid artery and a small to moderate amount of predominantly calcified plaque at the carotid bulb. No evidence of a significant stenosis or dissection. Partially retropharyngeal course of the proximal ICA. Moderate to prominent beading of the mid and distal cervical ICA. Left carotid system: Patent with scattered mixed plaque in the common carotid artery and a small to moderate amount of predominantly calcified plaque at the carotid bulb. No evidence of a significant stenosis or dissection. Partially retropharyngeal course of the mid cervical ICA. Mild-to-moderate beading of the mid to distal cervical ICA. Vertebral arteries: Patent in the neck with the right being dominant. Mixed plaque at the vertebral origins results in mild stenosis bilaterally. Skeleton: Mild cervical spondylosis. Other neck: No evidence of cervical lymphadenopathy. 1.4 cm right thyroid  nodule for which no follow-up imaging is required. Upper chest: No mass or consolidation in the included lung apices. Review of the MIP  images confirms the above findings CTA HEAD FINDINGS Anterior circulation: The internal carotid arteries are patent from skull base to carotid termini with mild atherosclerosis not resulting in significant stenosis. ACAs and MCAs are patent with mild branch vessel atherosclerotic type irregularity but no evidence of a proximal branch occlusion or significant proximal stenosis. No aneurysm is identified. Posterior circulation: The intracranial right vertebral artery is patent and supplies the basilar with atherosclerotic irregularity but no significant stenosis. The intracranial left vertebral artery is patent proximally but is occluded distal to the PICA origin which is new from 2016. Patent PICA and SCA origins are visualized bilaterally. The basilar artery is patent with atherosclerotic irregularity including a new mild-to-moderate stenosis in its midportion. Posterior communicating arteries are diminutive or absent. The right PCA is patent with atherosclerotic type irregularity but no significant proximal stenosis. Chronic occlusion of the proximal left PCA near the P1-P2 junction is unchanged from 2016. No aneurysm is identified. Venous sinuses: Patent. Anatomic variants: None. Review of the MIP images confirms the above findings IMPRESSION: 1. 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 2. Chronic proximal left PCA occlusion. 3. Intracranial atherosclerosis including a new mild-to-moderate basilar artery stenosis. 4. Cervical  carotid atherosclerosis without significant stenosis. 5. Beading of the cervical internal carotid arteries, right greater than left, suggesting fibromuscular dysplasia. 6. Aortic Atherosclerosis (ICD10-I70.0). Electronically Signed   By: Dasie Hamburg M.D.   On: 01/12/2024 15:53   MR BRAIN WO CONTRAST Addendum Date: 01/11/2024 ADDENDUM REPORT: 01/11/2024 19:49 ADDENDUM: Finding omitted from impression: Signal abnormality within the left vertebral artery V3  segment suggesting high-grade stenosis or vessel occlusion. CT or MR angiography may be obtained for further evaluation, as clinically warranted. These results will be called to the ordering clinician or representative by the Radiologist Assistant, and communication documented in the PACS or [employer]. Electronically Signed   By: Rockey Childs D.O.   On: 01/11/2024 19:49   Result Date: 01/11/2024 CLINICAL DATA:  Provided history: Mental status change, unknown cause. EXAM: MRI HEAD WITHOUT CONTRAST TECHNIQUE: Multiplanar, multiecho pulse sequences of the brain and surrounding structures were obtained without intravenous contrast. COMPARISON:  Head CT 01/10/2024. Brain MRI 02/21/2015. MRA head 02/20/2015. FINDINGS: Brain: Mild generalized cerebral atrophy. Multifocal T2 FLAIR hyperintense signal abnormality within the cerebral white matter, nonspecific but compatible with mild chronic small vessel ischemic disease. Several chronic microhemorrhages within the supratentorial brain and cerebellum (with a thalamic predominance), likely reflecting sequelae of chronic hypertensive microangiopathy. There is no acute infarct. No evidence of an intracranial mass. No extra-axial fluid collection. No midline shift. Vascular: Signal abnormality within the left vertebral artery V3 segments suggesting high-grade stenosis or vessel occlusion. Flow voids preserved elsewhere within the proximal large arterial vessels. Skull and upper cervical spine: No focal worrisome marrow lesion. Sinuses/Orbits: No mass or acute finding within the imaged orbits. Prior bilateral ocular lens replacement. Small fluid level within the right sphenoid sinus. Other: Small-volume fluid within right mastoid air cells. IMPRESSION: 1. No evidence of an acute intracranial abnormality. 2. Mild chronic small vessel ischemic changes within the cerebral white matter, slightly progressed from the prior MRI of 02/21/2015. 3. Several chronic microhemorrhages  within the supratentorial brain and cerebellum, with a distribution suggesting sequelae of chronic hypertensive microangiopathy. 4. Mild generalized cerebral atrophy. 5. Mild right sphenoid sinus disease. 6. Small right mastoid effusion. Electronically Signed: By: Rockey Childs D.O. On: 01/11/2024 19:43   Scheduled Meds:  acetaminophen   1,000 mg Oral Q8H   atorvastatin   80 mg Oral Daily   enoxaparin  (LOVENOX ) injection  30 mg Subcutaneous Q12H   mirtazapine   7.5 mg Oral QHS   Continuous Infusions:   LOS: 3 days    Time spent: 50 mins    Darcel Dawley, MD [facility]   If 7PM-7AM, please contact night-coverage

## 2024-01-13 NOTE — Progress Notes (Signed)
 Physical Therapy Treatment Patient Details Name: [name] MRN: [id number] DOB: [birth year] Today's Date: 01/13/2024   History of Present Illness Pt is 89 yo female who presents on 01/10/24 after fall at home which was preceded by dizziness and passing out and followed by R foot and R shoulder pain. Found to have splenic laceration, small subscapular hematoma, and new onset A-fib. Suspected nondisplaced fx of R medial malleolus and distal tibia. PMH: HTN, HLD, chronic L1 compression fx, R thyroid  nodule, RA    PT Comments  Today's session focused on transfer training and w/c training. Pt completed a stand pivot transfer to/from [facility] using RW and a squat pivot transfer to/from w/c with modA. She required +2 assist throughout functional mobility for safety. Pt frequently attempted to weight-bear through RLE. Provided re-education, re-direction, and support under R foot to limit weight acceptance. Introduced manual w/c and educated pt/son on proper and safe use of AD as well as demonstrated features. Instructed pt on how to propel w/c through hand over hand facilitation on wheel. She required minA to safely navigate ~9ft, distance limited by decreased activity tolerance. Pt and son understand she will currently required 24/7 supervision and assistance to mobilize using w/c. Will continue to follow acutely and advance appropriately.    If plan is discharge home, recommend the following: A lot of help with walking and/or transfers;A lot of help with bathing/dressing/bathroom;Assistance with cooking/housework;Assist for transportation;Help with stairs or ramp for entrance   Can travel by private vehicle        Equipment Recommendations  Wheelchair (measurements PT);Wheelchair cushion (measurements PT);BSC/3in1    Recommendations for Other Services       Precautions / Restrictions Precautions Precautions: Fall Recall of Precautions/Restrictions: Impaired Precaution/Restrictions Comments: Pt is  unable to maintain RLE NWB at all times during mobility despite multi-modal cueing and frequent re-education. She is fearful of falling, but resistant to PT's hands on assistance Required Braces or Orthoses: Splint/Cast Splint/Cast: RLE Splint/Cast - Date Prophylactic Dressing Applied (if applicable): 01/11/24 Restrictions Weight Bearing Restrictions Per Provider Order: Yes RLE Weight Bearing Per Provider Order: Non weight bearing     Mobility  Bed Mobility Overal bed mobility: Needs Assistance Bed Mobility: Supine to Sit, Sit to Supine     Supine to sit: Supervision, HOB elevated, Used rails Sit to supine: Supervision, HOB elevated   General bed mobility comments: Pt sat up on L side of bed with increased time and use of bed rails. She managed BLEs and elevated/lowered trunk. Supervision for safety.    Transfers Overall transfer level: Needs assistance Equipment used: Rolling walker (2 wheels), None Transfers: Sit to/from Stand, Bed to chair/wheelchair/BSC Sit to Stand: Min assist, +2 safety/equipment Stand pivot transfers: Mod assist, +2 safety/equipment   Squat pivot transfers: Mod assist, +2 safety/equipment     General transfer comment: Pt stood from lowest bed height. Cued proper sequencing and use of RW. She transferred to/from [facility] using RW via stand pivot transfer. PT provided support under R foot to prevent pt from weight-bearing. Pt attempted to bring RLE on to the floor and press through leg. Cues for sequencing to pivot on L foot. Assist to faciliate hips towards the surface she was going to and manuever RW. Good ecccentric control. Pt transferred to/from w/c by going to her left via squat pivot transfer. Demonstrated proper technique. Cued sequencing. Pt utilized BUE support on bed, transitioning to PT's elbows, and then to armrests. Son assisted pt with returning to bed with step-by-step cues for sequencing  and hands on assist by PT for safety.    Ambulation/Gait                General Gait Details: Unable - pt cannot maintain RLE NWB while hopping on LLE   Psychologist, counselling mobility: Yes Wheelchair propulsion: Both upper extremities Wheelchair parts: Needs assistance Distance: 10 Wheelchair Assistance Details (indicate cue type and reason): Educated pt and son on w/c features including removable and elevating leg rests. Demonstrated how to adjust features. Discussed drop arm rests, but w/c present during session did not have this feature. Educated pt and son on how to propel w/c forwards, backwards, and turning. Provided hand over hand assist to guide pt through propulsion and turning. She was able to advance w/c ~35ft before fatiguing. Advised pt and son that she will need someone present 24/7 for mobility.   Tilt Bed    Modified Rankin (Stroke Patients Only)       Balance Overall balance assessment: Needs assistance Sitting-balance support: No upper extremity supported, Feet supported Sitting balance-Leahy Scale: Fair     Standing balance support: Bilateral upper extremity supported, During functional activity, Reliant on assistive device for balance Standing balance-Leahy Scale: Poor Standing balance comment: Pt dependent on BUE support on PT vs. RW. She benefits from +2 assist for safety                            Communication Communication Communication: No apparent difficulties (Son served as Solicitor)  Cognition Arousal: Alert Behavior During Therapy: Anxious   PT - Cognitive impairments: Sequencing, Awareness, Safety/Judgement, Problem solving                       PT - Cognition Comments: Pt took increased time to follow commands, appeared to have delayed processing. She had difficulty abiding by weight-bearing restrictions. Son reports she has a hard time learning new skills when educating transfer training and  w/c propulsion. Following commands: Impaired Following commands impaired: Follows one step commands inconsistently, Follows one step commands with increased time    Cueing Cueing Techniques: Verbal cues, Gestural cues, Tactile cues, Visual cues  Exercises      General Comments General comments (skin integrity, edema, etc.): Discussed d/c disposition and pt's need for 24/7 support to complete any mobility. Advised pt's son on how family should be positioned to support during functional mobility and emphasized needs to maintain RLE NWB. Provided pt and son with gait belt.      Pertinent Vitals/Pain Pain Assessment Pain Assessment: Faces Faces Pain Scale: Hurts even more Pain Location: RLE Pain Descriptors / Indicators: Discomfort, Aching, Sore, Tender Pain Intervention(s): Monitored during session, Limited activity within patient's tolerance, Patient requesting pain meds-RN notified    Home Living                          Prior Function            PT Goals (current goals can now be found in the care plan section) Acute Rehab PT Goals Patient Stated Goal: return home Progress towards PT goals: Progressing toward goals    Frequency    Min 2X/week      PT Plan      Co-evaluation  AM-PAC PT 6 Clicks Mobility   Outcome Measure  Help needed turning from your back to your side while in a flat bed without using bedrails?: A Little Help needed moving from lying on your back to sitting on the side of a flat bed without using bedrails?: A Little Help needed moving to and from a bed to a chair (including a wheelchair)?: A Lot Help needed standing up from a chair using your arms (e.g., wheelchair or bedside chair)?: A Little Help needed to walk in hospital room?: Total Help needed climbing 3-5 steps with a railing? : Total 6 Click Score: 13    End of Session Equipment Utilized During Treatment: Gait belt Activity Tolerance: Patient tolerated  treatment well Patient left: in bed;with call bell/phone within reach;with family/visitor present Nurse Communication: Mobility status;Weight bearing status PT Visit Diagnosis: Unsteadiness on feet (R26.81);History of falling (Z91.81);Difficulty in walking, not elsewhere classified (R26.2)     Time: 8595-8554 PT Time Calculation (min) (ACUTE ONLY): 41 min  Charges:    $Therapeutic Activity: 23-37 mins $Wheel Chair Management: 8-22 mins PT General Charges $$ ACUTE PT VISIT: 1 Visit                     Randall SAUNDERS, PT, DPT Acute Rehabilitation Services Office: [phone] Secure Chat Preferred  Delon CHRISTELLA Callander 01/13/2024, 4:12 PM

## 2024-01-13 NOTE — Telephone Encounter (Signed)
 Received call from Abby with [employer] calling to report patient had afib rate 78 at 6:56 am this morning.Message sent to Dayna Dunn PA.

## 2024-01-13 NOTE — Progress Notes (Signed)
 Subjective: CC: Family at bedside. Spanish interpreter was offered and declined. Family used for spanish interpreter.   Patient reports no abdominal pain. Tolerating regular diet without n/v., but per family with poor appetite, eating 1/2 her meal or less. + flatus. No BM yet. Voiding without issues.  Afebrile. No tachycardia or systolic hypotension.  Objective: Vital signs in last 24 hours: Temp:  [97.8 F (36.6 C)-98.9 F (37.2 C)] 97.8 F (36.6 C) (09/24 0740) Pulse Rate:  [48-85] 74 (09/24 0740) Resp:  [15-17] 17 (09/24 0740) BP: (111-123)/(59-69) 122/69 (09/24 0740) SpO2:  [93 %-98 %] 98 % (09/24 0740) Last BM Date : 01/09/24  Intake/Output from previous day: 09/23 0701 - 09/24 0700 In: 120 [P.O.:120] Out: -  Intake/Output this shift: No intake/output data recorded.  PE: Gen:  Alert, NAD, pleasant Card:  Reg rate Pulm:  CTAB, no W/R/R, effort normal Abd: Soft, ND, NT Ext:  R ankle splint in place - wiggles digits, wwp. No LLE edema  Lab Results:  Recent Labs    01/12/24 1110 01/13/24 0430  WBC 6.9 6.2  HGB 10.6* 10.4*  HCT 32.2* 30.9*  PLT 240 250   BMET Recent Labs    01/11/24 0654 01/12/24 1110  NA 132* 136  K 3.8 3.6  CL 103 105  CO2 20* 21*  GLUCOSE 92 85  BUN 8 9  CREATININE 0.70 0.75  CALCIUM  7.7* 8.0*   PT/INR Recent Labs    01/11/24 0654  LABPROT 16.2*  INR 1.2   CMP     Component Value Date/Time   NA 136 01/12/2024 1110   NA 140 01/23/2023 1138   K 3.6 01/12/2024 1110   CL 105 01/12/2024 1110   CO2 21 (L) 01/12/2024 1110   GLUCOSE 85 01/12/2024 1110   BUN 9 01/12/2024 1110   BUN 15 01/23/2023 1138   CREATININE 0.75 01/12/2024 1110   CREATININE 0.86 11/13/2021 1115   CALCIUM  8.0 (L) 01/12/2024 1110   PROT 5.7 (L) 01/11/2024 0654   PROT 6.6 01/23/2023 1138   ALBUMIN 2.6 (L) 01/11/2024 0654   ALBUMIN 3.8 01/23/2023 1138   AST 18 01/11/2024 0654   ALT 13 01/11/2024 0654   ALKPHOS 66 01/11/2024 0654   BILITOT 0.9  01/11/2024 0654   BILITOT 0.5 01/23/2023 1138   GFRNONAA >60 01/12/2024 1110   GFRNONAA 73 04/26/2018 1005   GFRAA 59 (L) 12/28/2019 1530   GFRAA 84 04/26/2018 1005   Lipase  No results found for: LIPASE  Studies/Results: CT ANGIO HEAD NECK W WO CM Result Date: 01/12/2024 CLINICAL DATA:  Vertebral artery stenosis suspected. EXAM: CT ANGIOGRAPHY HEAD AND NECK WITH AND WITHOUT CONTRAST TECHNIQUE: Multidetector CT imaging of the head and neck was performed using the standard protocol during bolus administration of intravenous contrast. Multiplanar CT image reconstructions and MIPs were obtained to evaluate the vascular anatomy. Carotid stenosis measurements (when applicable) are obtained utilizing NASCET criteria, using the distal internal carotid diameter as the denominator. RADIATION DOSE REDUCTION: This exam was performed according to the departmental dose-optimization program which includes automated exposure control, adjustment of the mA and/or kV according to patient size and/or use of iterative reconstruction technique. CONTRAST:  65mL OMNIPAQUE  IOHEXOL  350 MG/ML SOLN COMPARISON:  Head MRI 01/11/2024. Head MRA 02/20/2015. CT chest 01/10/2024. FINDINGS: CT HEAD FINDINGS Brain: There is no evidence of an acute infarct, intracranial hemorrhage, mass, midline shift, or extra-axial fluid collection. There is mild cerebral atrophy. Mildly prominent extra-axial CSF over the  left frontotemporal convexity and along the right interhemispheric fissure appears to reflect subarachnoid space enlargement from volume loss rather than small subdural hygromas based on MRI. Cerebral white matter hypodensities are nonspecific but compatible with mild chronic small vessel ischemic disease. Vascular: Calcified atherosclerosis at the skull base. Skull: No fracture or suspicious lesion. Sinuses/Orbits: Clear paranasal sinuses. Small right mastoid effusion. Bilateral cataract extraction. Other: None. Review of the MIP  images confirms the above findings CTA NECK FINDINGS Aortic arch: Standard branching with mild-to-moderate atherosclerosis including a 1.1 cm focus of soft plaque along the lateral wall of the proximal descending thoracic aorta which is unchanged from a 01/10/2024 chest CT. Widely patent brachiocephalic and subclavian arteries. Right carotid system: Patent with scattered mixed plaque throughout the common carotid artery and a small to moderate amount of predominantly calcified plaque at the carotid bulb. No evidence of a significant stenosis or dissection. Partially retropharyngeal course of the proximal ICA. Moderate to prominent beading of the mid and distal cervical ICA. Left carotid system: Patent with scattered mixed plaque in the common carotid artery and a small to moderate amount of predominantly calcified plaque at the carotid bulb. No evidence of a significant stenosis or dissection. Partially retropharyngeal course of the mid cervical ICA. Mild-to-moderate beading of the mid to distal cervical ICA. Vertebral arteries: Patent in the neck with the right being dominant. Mixed plaque at the vertebral origins results in mild stenosis bilaterally. Skeleton: Mild cervical spondylosis. Other neck: No evidence of cervical lymphadenopathy. 1.4 cm right thyroid  nodule for which no follow-up imaging is required. Upper chest: No mass or consolidation in the included lung apices. Review of the MIP images confirms the above findings CTA HEAD FINDINGS Anterior circulation: The internal carotid arteries are patent from skull base to carotid termini with mild atherosclerosis not resulting in significant stenosis. ACAs and MCAs are patent with mild branch vessel atherosclerotic type irregularity but no evidence of a proximal branch occlusion or significant proximal stenosis. No aneurysm is identified. Posterior circulation: The intracranial right vertebral artery is patent and supplies the basilar with atherosclerotic  irregularity but no significant stenosis. The intracranial left vertebral artery is patent proximally but is occluded distal to the PICA origin which is new from 2016. Patent PICA and SCA origins are visualized bilaterally. The basilar artery is patent with atherosclerotic irregularity including a new mild-to-moderate stenosis in its midportion. Posterior communicating arteries are diminutive or absent. The right PCA is patent with atherosclerotic type irregularity but no significant proximal stenosis. Chronic occlusion of the proximal left PCA near the P1-P2 junction is unchanged from 2016. No aneurysm is identified. Venous sinuses: Patent. Anatomic variants: None. Review of the MIP images confirms the above findings IMPRESSION: 1. 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 2. Chronic proximal left PCA occlusion. 3. Intracranial atherosclerosis including a new mild-to-moderate basilar artery stenosis. 4. Cervical carotid atherosclerosis without significant stenosis. 5. Beading of the cervical internal carotid arteries, right greater than left, suggesting fibromuscular dysplasia. 6. Aortic Atherosclerosis (ICD10-I70.0). Electronically Signed   By: Dasie Hamburg M.D.   On: 01/12/2024 15:53   MR BRAIN WO CONTRAST Addendum Date: 01/11/2024 ADDENDUM REPORT: 01/11/2024 19:49 ADDENDUM: Finding omitted from impression: Signal abnormality within the left vertebral artery V3 segment suggesting high-grade stenosis or vessel occlusion. CT or MR angiography may be obtained for further evaluation, as clinically warranted. These results will be called to the ordering clinician or representative by the Radiologist Assistant, and communication documented in the PACS or  [facility] Dashboard. Electronically Signed   By: Rockey Childs D.O.   On: 01/11/2024 19:49   Result Date: 01/11/2024 CLINICAL DATA:  Provided history: Mental status change, unknown cause. EXAM: MRI HEAD WITHOUT CONTRAST TECHNIQUE:  Multiplanar, multiecho pulse sequences of the brain and surrounding structures were obtained without intravenous contrast. COMPARISON:  Head CT 01/10/2024. Brain MRI 02/21/2015. MRA head 02/20/2015. FINDINGS: Brain: Mild generalized cerebral atrophy. Multifocal T2 FLAIR hyperintense signal abnormality within the cerebral white matter, nonspecific but compatible with mild chronic small vessel ischemic disease. Several chronic microhemorrhages within the supratentorial brain and cerebellum (with a thalamic predominance), likely reflecting sequelae of chronic hypertensive microangiopathy. There is no acute infarct. No evidence of an intracranial mass. No extra-axial fluid collection. No midline shift. Vascular: Signal abnormality within the left vertebral artery V3 segments suggesting high-grade stenosis or vessel occlusion. Flow voids preserved elsewhere within the proximal large arterial vessels. Skull and upper cervical spine: No focal worrisome marrow lesion. Sinuses/Orbits: No mass or acute finding within the imaged orbits. Prior bilateral ocular lens replacement. Small fluid level within the right sphenoid sinus. Other: Small-volume fluid within right mastoid air cells. IMPRESSION: 1. No evidence of an acute intracranial abnormality. 2. Mild chronic small vessel ischemic changes within the cerebral white matter, slightly progressed from the prior MRI of 02/21/2015. 3. Several chronic microhemorrhages within the supratentorial brain and cerebellum, with a distribution suggesting sequelae of chronic hypertensive microangiopathy. 4. Mild generalized cerebral atrophy. 5. Mild right sphenoid sinus disease. 6. Small right mastoid effusion. Electronically Signed: By: Rockey Childs D.O. On: 01/11/2024 19:43    Anti-infectives: Anti-infectives (From admission, onward)    None        Assessment/Plan GLF, syncopal episode G2 spleen laceration - HDS, hgb stable on last check. CBC pending this AM. Off bedrest.  Mobilize. Okay for regular diet.  R ankle fx - Per Ortho, they recommend splint and NWB, f/u in 1 weeks for xrays.   - Per [facility] -  Syncope - cardiology following. MRI brain done. Defer if any further imaging needed to primary team.  New onset A. Fib  R thyroid  nodule Hypodensities of the liver on CT Chronic L1 compression fx HTN HLD   FEN - Reg, IVF per [facility] VTE - SCDs, hgb stable, s/p mobility - recommend starting DVT ppx with lovenox  30mg  BID today. ID - None Foley - None, spont void. UA pending.  Plan -patients hgb stable, hemodynamically stable, pain controlled, traumatic injuries stable. Trauma will sign off. Call as needed. Follow up with ortho as above, follow up in our office on an as needed basis.  I reviewed nursing notes, Consultant (Ortho, Cards) notes, hospitalist notes, last 24 h vitals and pain scores, last 48 h intake and output, last 24 h labs and trends, and last 24 h imaging results.    LOS: 3 days    [name], [facility] 01/13/2024, 8:42 AM Please see Amion for pager number during day hours 7:00am-4:30pm

## 2024-01-13 NOTE — Telephone Encounter (Signed)
 [employer] representative calling with STAT irregular EKG results. Call transferred.

## 2024-01-13 NOTE — Care Management Important Message (Signed)
 Important Message  Patient Details  Name: [name] MRN: [id number] Date of Birth: 12/31/1932   Important Message Given:  Yes - Medicare IM     Jon Cruel 01/13/2024, 3:43 PM

## 2024-01-14 ENCOUNTER — Other Ambulatory Visit (HOSPITAL_COMMUNITY): Payer: Self-pay

## 2024-01-14 DIAGNOSIS — R55 Syncope and collapse: Secondary | ICD-10-CM | POA: Diagnosis not present

## 2024-01-14 LAB — CBC
HCT: 33.3 % — ABNORMAL LOW (ref 36.0–46.0)
Hemoglobin: 11 g/dL — ABNORMAL LOW (ref 12.0–15.0)
MCH: 27.6 pg (ref 26.0–34.0)
MCHC: 33 g/dL (ref 30.0–36.0)
MCV: 83.7 fL (ref 80.0–100.0)
Platelets: 293 K/uL (ref 150–400)
RBC: 3.98 MIL/uL (ref 3.87–5.11)
RDW: 14.7 % (ref 11.5–15.5)
WBC: 8.3 K/uL (ref 4.0–10.5)
nRBC: 0 % (ref 0.0–0.2)

## 2024-01-14 LAB — BASIC METABOLIC PANEL WITH GFR
Anion gap: 8 (ref 5–15)
BUN: 11 mg/dL (ref 8–23)
CO2: 20 mmol/L — ABNORMAL LOW (ref 22–32)
Calcium: 8.1 mg/dL — ABNORMAL LOW (ref 8.9–10.3)
Chloride: 108 mmol/L (ref 98–111)
Creatinine, Ser: 0.89 mg/dL (ref 0.44–1.00)
GFR, Estimated: >60 mL/min (ref >60)
Glucose, Bld: 92 mg/dL (ref 70–99)
Potassium: 3.4 mmol/L — ABNORMAL LOW (ref 3.5–5.1)
Sodium: 136 mmol/L (ref 135–145)

## 2024-01-14 LAB — PHOSPHORUS: Phosphorus: 3 mg/dL (ref 2.5–4.6)

## 2024-01-14 LAB — MAGNESIUM: Magnesium: 2 mg/dL (ref 1.7–2.4)

## 2024-01-14 MED ORDER — POTASSIUM CHLORIDE 20 MEQ PO PACK
40.0000 meq | PACK | Freq: Once | ORAL | Status: AC
Start: 2024-01-14 — End: 2024-01-14
  Administered 2024-01-14: 40 meq via ORAL
  Filled 2024-01-14: qty 2

## 2024-01-14 MED ORDER — MIRTAZAPINE 7.5 MG PO TABS
7.5000 mg | ORAL_TABLET | Freq: Every day | ORAL | 0 refills | Status: AC
  Filled 2024-01-14: qty 30, 30d supply, fill #0

## 2024-01-14 NOTE — TOC Transition Note (Signed)
 Transition of Care [facility]) - Discharge Note   Patient Details  Name: [name] MRN: [id number] Date of Birth: Jan 14, 1933  Transition of Care [facility] [address]) CM/SW Contact:  Rosalva Jon Bloch, RN Phone Number: 01/14/2024, 10:56 AM   Clinical Narrative:    Patient will DC to: home Anticipated DC date: 01/14/2024 Family notified: no Transport by: car    - nondisplaced fx of R medial malleolus and distal tibia. PMH: HTN, HLD, chronic L1 compression fx, R thyroid  nodule, RA   Per MD patient ready for DC today . RN, patient, and patient's family  notified of DC.   Home health and DME needs noted, pt agreeable. Pt without provider preference.  Referral made with [facility] and accepted for home  health PT and OT services. Referral for DME: RW and W/C made with [facility] [phone]). Equipment will be delivered to bedside prior to d/c. Husband to provide transportation to home. Pt without RX med concerns. Post hospital f/u noted on AVS.  RNCM will sign off for now as intervention is no longer needed. Please consult us  again if new needs arise.   Final next level of care: Home w Home Health Services Barriers to Discharge: No Barriers Identified   Patient Goals and CMS Choice     Choice offered to / list presented to : Patient      Discharge Placement                       Discharge Plan and Services Additional resources added to the After Visit Summary for                  DME Arranged: Bedside commode, Lightweight manual wheelchair with seat cushion DME Agency: [facility] Date DME Agency Contacted: 01/14/24 Time DME Agency Contacted: 1055 Representative spoke with at DME Agency: [facility] HH Arranged: OT, PT HH Agency: [facility] Date [facility] Agency Contacted: 01/14/24 Time HH Agency Contacted: 1051 Representative spoke with at [facility] Agency: [facility]  Social Drivers of Health (SDOH) Interventions SDOH Screenings   Food Insecurity: No Food Insecurity  (01/11/2024)  Housing: Low Risk  (01/11/2024)  Transportation Needs: No Transportation Needs (01/11/2024)  Utilities: Not At Risk (01/11/2024)  Alcohol Screen: Low Risk  (06/12/2023)  Depression (PHQ2-9): Low Risk  (12/31/2023)  Financial Resource Strain: Low Risk  (06/12/2023)  Physical Activity: Insufficiently Active (06/12/2023)  Social Connections: Moderately Integrated (01/11/2024)  Stress: No Stress Concern Present (06/12/2023)  Tobacco Use: Low Risk  (01/10/2024)  Health Literacy: Adequate Health Literacy (06/12/2023)     Readmission Risk Interventions     No data to display

## 2024-01-14 NOTE — Plan of Care (Signed)
   Problem: Safety: Goal: Ability to remain free from injury will improve Outcome: Progressing

## 2024-01-14 NOTE — Discharge Summary (Signed)
 Physician Discharge Summary  [name] FMW:[id number] DOB: 1932/07/13 DOA: 01/10/2024  PCP: Dettinger, Fonda LABOR, MD  Admit date: 01/10/2024  Discharge date: 01/14/2024  Admitted From: Home  Disposition:  Home Health Services.  Recommendations for Outpatient Follow-up:  Follow up with PCP in 1-2 weeks. Please obtain BMP/CBC in one week. Home health services has been arranged. Advised to follow-up with Cardiology to go over anticoagulation long-term. Advised to follow-up with orthopedics as scheduled.  Home Health: Home PT/OT Equipment/Devices:None  Discharge Condition: Stable CODE STATUS:Full code Diet recommendation: Heart Healthy   Brief [facility] Course: This 89 y.o. female with medical history significant of HTN, HLD who presents after syncopal episode. Patient is Spanish speaking and her family are at bedside to translate. She presented with s/p fall and unwitnessed event of syncope. Trauma surgery was consulted. Cardiology consulted.  Patient was admitted for further evaluation. She was found to have a splenic laceration.  As per trauma and as per cardiology avoiding anticoagulation due to splenic laceration until followed up outpatient with cardiology to discuss long-term need.  Patient also has new onset A-fib which was rate controlled.  Echo shows LVEF 55 to 60%.  Given right medial malleolus fracture orthopedic recommended conservative management,  nonweightbearing on the right lower extremity.  PT and OT recommended home health services. Patient feels better and wants to be discharged home.  Syncope was likely vasovagal.  Home health services arranged.   Discharge Diagnoses:  Principal Problem:   Syncope, vasovagal Active Problems:   HTN (hypertension)   Thyroid  nodule   Dyslipidemia   Spleen laceration, sequela   Atrial fibrillation (HCC)   Nondisplaced fracture of medial malleolus of right tibia, initial encounter for closed fracture   Vasovagal  syncope  Syncope and collapse: Unwitnessed fall. Suspected to be vasovagal due to poor p.o. intake. She does have history of moderate aortic stenosis and was actually also found to be in A-fib which is new. She was noted to have fatigue, right facial droop and poor attention. CT head unremarkable. CT maxillary spine unremarkable. MRI brain negative for acute stroke but shows incidentally vertebral artery stenosis on the left side. CTA head and neck was recommended which was performed, shows vertebral to stenosis.  No evidence of dissection. Once the patient is able to take anticoagulation, Patient will be on Eliquis which would help treat the condition. Appreciate cardiology consultation. Monitor on telemetry.  Will require outpatient monitor. Remains in A-fib but Heart rate controlled. Most likely will be able to discharge home as long as remains medically stable.   New onset A-fib: Heart rate controlled Echo recently completed 01/06/2024 which revealed EF 55 to 60%, normal diastolic parameter, mild to moderate aortic stenosis, mild mitral regurgitation Cardiology consulted.  Outpatient follow-up recommended.    Splenic laceration: Trauma surgery consulted. Currently on regular diet.  H&H relatively stable with dilutional component. Avoiding anticoagulation due to splenic laceration until seen by cardiology outpatient. Surgery recommended starting DVT prophylaxis with Lovenox  30 mg twice daily today.   Right medial malleolus fracture: Orthopedic surgery consulted Currently recommending conservative management. Nonweightbearing on right lower extremity.   Thyroid  nodule  incidental finding on CT 1.6 cm heterogeneous solid nodule right lobe of the thyroid  gland. This would ordinarily warrant a nonemergent ultrasound follow-up recommendation, but this should only be done if clinically warranted given her advanced age.   HTN: Resume Norvasc  as BP improved   HLD: Hold Lipitor     Aortic stenosis. Mild to moderate. Appreciate cardiology consultation.  Discharge Instructions  Discharge Instructions     Call MD for:  difficulty breathing, headache or visual disturbances   Complete by: As directed    Call MD for:  persistant dizziness or light-headedness   Complete by: As directed    Call MD for:  persistant nausea and vomiting   Complete by: As directed    Diet - low sodium heart healthy   Complete by: As directed    Diet general   Complete by: As directed    Discharge instructions   Complete by: As directed    Advised to follow-up with primary care physician in 1 week. Home health services has been arranged. Advised to follow-up with cardiology to go over anticoagulation long-term. Advised to follow-up with orthopedics as scheduled.   Increase activity slowly   Complete by: As directed       Allergies as of 01/14/2024       Reactions   Penicillin G Hives   Lisinopril  Swelling   Tongue swelling        Medication List     STOP taking these medications    diclofenac  75 MG EC tablet Commonly known as: VOLTAREN        TAKE these medications    amLODipine  5 MG tablet Commonly known as: NORVASC  Take 1 tablet (5 mg total) by mouth daily.   atorvastatin  80 MG tablet Commonly known as: LIPITOR Take 1 tablet (80 mg total) by mouth daily.   HYDROcodone -acetaminophen  5-325 MG tablet Commonly known as: NORCO/VICODIN Take 1-2 tablets by mouth every 6 (six) hours as needed for moderate pain (pain score 4-6) or severe pain (pain score 7-10).   loratadine  10 MG tablet Commonly known as: CLARITIN  Take 1 tablet (10 mg total) by mouth daily.   mirtazapine  7.5 MG tablet Commonly known as: REMERON  Tome 1 tableta (7.5 mg en total) por va oral antes de acostarse. (Take 1 tablet (7.5 mg total) by mouth at bedtime.)   Myrbetriq  25 MG Tb24 tablet Generic drug: mirabegron  ER Take 1 tablet (25 mg total) by mouth daily.                Durable Medical Equipment  (From admission, onward)           Start     Ordered   01/14/24 1053  For home use only DME lightweight manual wheelchair with seat cushion  Once       Comments: Patient suffers from  fx of R medial malleolus and distal tibia  which impairs their ability to perform daily activities like bathing in the home.  A walker will not resolve  issue with performing activities of daily living. A wheelchair will allow patient to safely perform daily activities. Patient is not able to propel themselves in the home using a standard weight wheelchair due to general weakness. Patient can self propel in the lightweight wheelchair. Length of need 6 months . Accessories: elevating leg rests (ELRs), wheel locks, extensions and anti-tippers.   01/14/24 1053   01/14/24 1053  For home use only DME Bedside commode  Once       Comments: Confined to one room  Question:  Patient needs a bedside commode to treat with the following condition  Answer:  Fx   01/14/24 1053            Follow-up Information     Edna Toribio LABOR, MD Follow up in 2 week(s).   Specialty: Orthopedic Surgery Contact information: [address] [place] [address] [phone]         Dettinger, Fonda LABOR, MD Follow up in 1 week(s).   Specialties: Family Medicine, Cardiology Contact information: [address] [place] [address] [phone]         Croitoru, Jerel, MD Follow up in 1 week(s).   Specialty: Cardiology Contact information: [address] [place] [address] [phone]         [facility] Follow up.   Specialty: Home Health Services Why: home health services will be provided by [facility], start of care  with 48 hours post discharge Contact information: [address] [place] [address] [phone]                Allergies  Allergen Reactions   Penicillin G Hives   Lisinopril  Swelling    Tongue  swelling    Consultations: Cardiology Orthopedics   Procedures/Studies: CT ANGIO HEAD NECK W WO CM Result Date: 01/12/2024 CLINICAL DATA:  Vertebral artery stenosis suspected. EXAM: CT ANGIOGRAPHY HEAD AND NECK WITH AND WITHOUT CONTRAST TECHNIQUE: Multidetector CT imaging of the head and neck was performed using the standard protocol during bolus administration of intravenous contrast. Multiplanar CT image reconstructions and MIPs were obtained to evaluate the vascular anatomy. Carotid stenosis measurements (when applicable) are obtained utilizing NASCET criteria, using the distal internal carotid diameter as the denominator. RADIATION DOSE REDUCTION: This exam was performed according to the departmental dose-optimization program which includes automated exposure control, adjustment of the mA and/or kV according to patient size and/or use of iterative reconstruction technique. CONTRAST:  65mL OMNIPAQUE  IOHEXOL  350 MG/ML SOLN COMPARISON:  Head MRI 01/11/2024. Head MRA 02/20/2015. CT chest 01/10/2024. FINDINGS: CT HEAD FINDINGS Brain: There is no evidence of an acute infarct, intracranial hemorrhage, mass, midline shift, or extra-axial fluid collection. There is mild cerebral atrophy. Mildly prominent extra-axial CSF over the left frontotemporal convexity and along the right interhemispheric fissure appears to reflect subarachnoid space enlargement from volume loss rather than small subdural hygromas based on MRI. Cerebral white matter hypodensities are nonspecific but compatible with mild chronic small vessel ischemic disease. Vascular: Calcified atherosclerosis at the skull base. Skull: No fracture or suspicious lesion. Sinuses/Orbits: Clear paranasal sinuses. Small right mastoid effusion. Bilateral cataract extraction. Other: None. Review of the MIP images confirms the above findings CTA NECK FINDINGS Aortic arch: Standard branching with mild-to-moderate atherosclerosis including a 1.1 cm focus of soft  plaque along the lateral wall of the proximal descending thoracic aorta which is unchanged from a 01/10/2024 chest CT. Widely patent brachiocephalic and subclavian arteries. Right carotid system: Patent with scattered mixed plaque throughout the common carotid artery and a small to moderate amount of predominantly calcified plaque at the carotid bulb. No evidence of a significant stenosis or dissection. Partially retropharyngeal course of the proximal ICA. Moderate to prominent beading of the mid and distal cervical ICA. Left carotid system: Patent with scattered mixed plaque in the common carotid artery and a small to moderate amount of predominantly calcified plaque at the carotid bulb. No evidence of a significant stenosis or dissection. Partially retropharyngeal course of the mid cervical ICA. Mild-to-moderate beading of the mid to distal cervical ICA. Vertebral arteries: Patent in the neck with the right being dominant. Mixed plaque at the vertebral origins results in mild stenosis bilaterally. Skeleton: Mild cervical spondylosis. Other neck: No evidence of cervical lymphadenopathy. 1.4 cm right thyroid  nodule for which no follow-up imaging is required. Upper chest: No mass  or consolidation in the included lung apices. Review of the MIP images confirms the above findings CTA HEAD FINDINGS Anterior circulation: The internal carotid arteries are patent from skull base to carotid termini with mild atherosclerosis not resulting in significant stenosis. ACAs and MCAs are patent with mild branch vessel atherosclerotic type irregularity but no evidence of a proximal branch occlusion or significant proximal stenosis. No aneurysm is identified. Posterior circulation: The intracranial right vertebral artery is patent and supplies the basilar with atherosclerotic irregularity but no significant stenosis. The intracranial left vertebral artery is patent proximally but is occluded distal to the PICA origin which is new from  2016. Patent PICA and SCA origins are visualized bilaterally. The basilar artery is patent with atherosclerotic irregularity including a new mild-to-moderate stenosis in its midportion. Posterior communicating arteries are diminutive or absent. The right PCA is patent with atherosclerotic type irregularity but no significant proximal stenosis. Chronic occlusion of the proximal left PCA near the P1-P2 junction is unchanged from 2016. No aneurysm is identified. Venous sinuses: Patent. Anatomic variants: None. Review of the MIP images confirms the above findings IMPRESSION: 1. 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 2. Chronic proximal left PCA occlusion. 3. Intracranial atherosclerosis including a new mild-to-moderate basilar artery stenosis. 4. Cervical carotid atherosclerosis without significant stenosis. 5. Beading of the cervical internal carotid arteries, right greater than left, suggesting fibromuscular dysplasia. 6. Aortic Atherosclerosis (ICD10-I70.0). Electronically Signed   By: Dasie Hamburg M.D.   On: 01/12/2024 15:53   MR BRAIN WO CONTRAST Addendum Date: 01/11/2024 ADDENDUM REPORT: 01/11/2024 19:49 ADDENDUM: Finding omitted from impression: Signal abnormality within the left vertebral artery V3 segment suggesting high-grade stenosis or vessel occlusion. CT or MR angiography may be obtained for further evaluation, as clinically warranted. These results will be called to the ordering clinician or representative by the Radiologist Assistant, and communication documented in the PACS or [employer]. Electronically Signed   By: Rockey Childs D.O.   On: 01/11/2024 19:49   Result Date: 01/11/2024 CLINICAL DATA:  Provided history: Mental status change, unknown cause. EXAM: MRI HEAD WITHOUT CONTRAST TECHNIQUE: Multiplanar, multiecho pulse sequences of the brain and surrounding structures were obtained without intravenous contrast. COMPARISON:  Head CT 01/10/2024. Brain MRI  02/21/2015. MRA head 02/20/2015. FINDINGS: Brain: Mild generalized cerebral atrophy. Multifocal T2 FLAIR hyperintense signal abnormality within the cerebral white matter, nonspecific but compatible with mild chronic small vessel ischemic disease. Several chronic microhemorrhages within the supratentorial brain and cerebellum (with a thalamic predominance), likely reflecting sequelae of chronic hypertensive microangiopathy. There is no acute infarct. No evidence of an intracranial mass. No extra-axial fluid collection. No midline shift. Vascular: Signal abnormality within the left vertebral artery V3 segments suggesting high-grade stenosis or vessel occlusion. Flow voids preserved elsewhere within the proximal large arterial vessels. Skull and upper cervical spine: No focal worrisome marrow lesion. Sinuses/Orbits: No mass or acute finding within the imaged orbits. Prior bilateral ocular lens replacement. Small fluid level within the right sphenoid sinus. Other: Small-volume fluid within right mastoid air cells. IMPRESSION: 1. No evidence of an acute intracranial abnormality. 2. Mild chronic small vessel ischemic changes within the cerebral white matter, slightly progressed from the prior MRI of 02/21/2015. 3. Several chronic microhemorrhages within the supratentorial brain and cerebellum, with a distribution suggesting sequelae of chronic hypertensive microangiopathy. 4. Mild generalized cerebral atrophy. 5. Mild right sphenoid sinus disease. 6. Small right mastoid effusion. Electronically Signed: By: Rockey Childs D.O. On: 01/11/2024 19:43   CT  Foot Right Wo Contrast Result Date: 01/11/2024 CLINICAL DATA:  Foot trauma, occult fracture suspected. EXAM: CT OF THE RIGHT FOREFOOT WITHOUT CONTRAST TECHNIQUE: Multidetector CT imaging of the right forefoot was performed according to the standard protocol. Multiplanar CT image reconstructions were also generated. RADIATION DOSE REDUCTION: This exam was performed according  to the departmental dose-optimization program which includes automated exposure control, adjustment of the mA and/or kV according to patient size and/or use of iterative reconstruction technique. COMPARISON:  Radiographs 01/10/2024 and 12/24/2023. FINDINGS: Bones/Joint/Cartilage Ankle and hindfoot findings are dictated separately. Examination limited by osseous demineralization and hammertoe deformities. There is posttraumatic deformity of the 1st metatarsal neck. No definite acute fracture or dislocation identified within the forefoot or midfoot. Ankle fracture details are dictated separately. Mild forefoot degenerative changes, greatest at the 1st metatarsophalangeal joint. Ligaments Suboptimally assessed by CT. Muscles and Tendons Generalized muscular atrophy. No focal intramuscular fluid collection or tendon rupture identified. Soft tissues Nonspecific mild to moderate dorsal subcutaneous edema. No focal fluid collection, foreign body or soft tissue emphysema identified. Scattered vascular calcifications. IMPRESSION: 1. No definite acute fracture or dislocation identified within the forefoot or midfoot. 2. Ankle and hindfoot findings are dictated separately. 3. Nonspecific dorsal subcutaneous edema. No focal fluid collection, foreign body or soft tissue emphysema identified. Electronically Signed   By: Elsie Perone M.D.   On: 01/11/2024 09:31   CT Ankle Right Wo Contrast Result Date: 01/11/2024 CLINICAL DATA:  Ankle trauma, fracture seen on x-ray. EXAM: CT OF THE RIGHT ANKLE WITHOUT CONTRAST TECHNIQUE: Multidetector CT imaging of the right ankle was performed according to the standard protocol. Multiplanar CT image reconstructions were also generated. RADIATION DOSE REDUCTION: This exam was performed according to the departmental dose-optimization program which includes automated exposure control, adjustment of the mA and/or kV according to patient size and/or use of iterative reconstruction technique.  COMPARISON:  Radiographs 01/10/2024 and 12/24/2023. FINDINGS: Bones/Joint/Cartilage Forefoot findings are dictated separately. The bones are diffusely demineralized. There is a nondisplaced bimalleolar fracture at the ankle. Mildly comminuted oblique fracture through the base of the medial malleolus demonstrates no significant involvement of the weight-bearing articular surface. There is an oblique, nondisplaced fracture through distal fibular diaphysis. In addition, there is a small acute appearing avulsion fracture along the anterior aspect of the lateral malleolus. The posterior malleolus appears intact. Underlying mild tibiotalar degenerative changes and small ankle joint effusion. Unchanged, nonaggressive appearing chondroid lesion in the distal tibial diaphysis. No acute osseous findings are identified within the hindfoot or midfoot. Subtalar ankylosis noted. Ligaments Suboptimally assessed by CT. Muscles and Tendons As evaluated by CT, the ankle tendons appear intact, without entrapment in the fractures. Generalized muscular atrophy without apparent focal abnormality. Soft tissues Mild soft tissue swelling around the ankle. No focal fluid collection, foreign body or soft tissue emphysema. There are scattered vascular calcifications. IMPRESSION: 1. Nondisplaced bimalleolar fracture at the ankle as described. 2. No other acute osseous findings identified within the hindfoot or midfoot. 3. Underlying mild tibiotalar degenerative changes and small ankle joint effusion. Subtalar ankylosis. 4. Forefoot findings are dictated separately. Electronically Signed   By: Elsie Perone M.D.   On: 01/11/2024 08:19   DG Foot Complete Right Result Date: 01/10/2024 CLINICAL DATA:  Unwitnessed fall. EXAM: RIGHT TIBIA AND FIBULA - 2 VIEW; RIGHT FOOT COMPLETE - 3+ VIEW COMPARISON:  Right ankle series 12/28/2019, right foot series 04/26/2018. FINDINGS: Right tibia fibula series, two views in 4 films: There is advanced bone  density loss. Old bone infarct distal  right tibial shaft. On the second image, there is a suspected transverse oblique intra-articular fracture through the medial malleolus of the distal tibia. There is no further evidence of fractures. There is moderate nonerosive tibiotalar arthrosis. Dorsal calcaneal spurring. Right foot, three views: The medial malleolar nondisplaced fracture is redemonstrated. This is best seen on the oblique view. There is profound bone density loss. No displaced fracture is seen in the foot. There is pes planus and moderate arthrosis in the midfoot, near bone-on-bone first MTP joint space loss, and moderate hallux valgus. There are hammertoe deformities of second, third and fourth toes limiting assessment of the digits. There is generalized swelling in the foot, increased. There are vascular calcifications in the anterior and posterior distal foreleg. IMPRESSION: 1. Suspected nondisplaced transverse oblique intra-articular fracture through the medial malleolus of the distal tibia. 2. No further evidence of fractures in the right tibia fibula or right foot. 3. Degenerative changes and profound bone density loss. 4. First MTP joint arthrosis and hallux valgus.  Pes planus. 5. Vascular calcifications. 6.  Soft tissue swelling. Electronically Signed   By: Francis Quam M.D.   On: 01/10/2024 03:49   DG Tibia/Fibula Right Result Date: 01/10/2024 CLINICAL DATA:  Unwitnessed fall. EXAM: RIGHT TIBIA AND FIBULA - 2 VIEW; RIGHT FOOT COMPLETE - 3+ VIEW COMPARISON:  Right ankle series 12/28/2019, right foot series 04/26/2018. FINDINGS: Right tibia fibula series, two views in 4 films: There is advanced bone density loss. 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 On the second image, there is a suspected transverse oblique intra-articular fracture through the medial malleolus of the distal tibia. There is no further evidence of fractures. There is moderate nonerosive tibiotalar arthrosis. Dorsal  calcaneal spurring. Right foot, three views: The medial malleolar nondisplaced fracture is redemonstrated. This is best seen on the oblique view. There is profound bone density loss. No displaced fracture is seen in the foot. There is pes planus and moderate arthrosis in the midfoot, near bone-on-bone first MTP joint space loss, and moderate hallux valgus. There are hammertoe deformities of second, third and fourth toes limiting assessment of the digits. There is generalized swelling in the foot, increased. There are vascular calcifications in the anterior and posterior distal foreleg. IMPRESSION: 1. Suspected nondisplaced transverse oblique intra-articular fracture through the medial malleolus of the distal tibia. 2. No further evidence of fractures in the right tibia fibula or right foot. 3. Degenerative changes and profound bone density loss. 4. First MTP joint arthrosis and hallux valgus.  Pes planus. 5. Vascular calcifications. 6.  Soft tissue swelling. Electronically Signed   By: Francis Quam M.D.   On: 01/10/2024 03:49   DG Shoulder Right Result Date: 01/10/2024 CLINICAL DATA:  Unwitnessed fall at home. EXAM: RIGHT SHOULDER - 2+ VIEW COMPARISON:  None. FINDINGS: Three views. Osteopenia. No evidence of fracture or dislocation. Mild nonerosive arthrosis noted of the right glenohumeral and AC joints, with spurring at the greater tuberosity. Soft tissues are unremarkable. IMPRESSION: Osteopenia and degenerative change without evidence of fractures. Electronically Signed   By: Francis Quam M.D.   On: 01/10/2024 03:37   CT CHEST ABDOMEN PELVIS W CONTRAST Result Date: 01/10/2024 CLINICAL DATA:  Unwitnessed fall at home with blunt polytrauma. EXAM: CT CHEST, ABDOMEN, AND PELVIS WITH CONTRAST TECHNIQUE: Multidetector CT imaging of the chest, abdomen and pelvis was performed following the standard protocol during bolus administration of intravenous contrast. RADIATION DOSE REDUCTION: This exam was performed  according to the departmental dose-optimization program which includes automated  exposure control, adjustment of the mA and/or kV according to patient size and/or use of iterative reconstruction technique. CONTRAST:  70mL OMNIPAQUE  IOHEXOL  300 MG/ML  SOLN COMPARISON:  Chest PA Lat 05/24/2015.  No other relevant prior. FINDINGS: CT CHEST FINDINGS Cardiovascular: There is mild cardiomegaly with biatrial chamber predominance. Scattered calcification in the mitral ring. There are moderate calcifications and thickening of the aortic valve leaflets. The thoracic aorta demonstrates moderate patchy calcifications, scattered calcification in the great vessels, mixed plaques in the descending segment and no aneurysm, stenosis or dissection. There is a 1.1 cm rounded sessile focal soft plaque deposit along the left posterior proximal descending aortic wall. No penetrating ulcer. There is an enlarged pulmonary trunk 3.3 cm indicating arterial hypertension. No central embolus is seen. No venous dilatation. There is no pericardial effusion. There are left main and scattered two-vessel coronary artery calcifications in the LAD and right coronary artery. Mediastinum/Nodes: 1.6 cm heterogeneous solid nodule right lobe of the thyroid  gland. This would ordinarily warrant a nonemergent ultrasound follow-up recommendation, but this should only be done if clinically warranted given her advanced age. Remaining thyroid  is unremarkable. There are no enlarged axillary lymph nodes. No intrathoracic adenopathy. Patulous but otherwise unremarkable esophagus, likely reflecting chronic dysmotility. The thoracic trachea and main bronchi are clear. Lungs/Pleura: There is mild diffuse bronchial thickening. There is posterior atelectasis in both lungs. Elevated right hemidiaphragm. There is a calcified right upper lobe granuloma laterally. There is no consolidation, nodules, effusion or pneumothorax. Musculoskeletal: Osteopenia with degenerative  changes of the spine and mild kyphosis. 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 No regional skeletal fracture is seen. No chest wall mass. CT ABDOMEN PELVIS FINDINGS Hepatobiliary: There are occasional scattered tiny hypodensities in the hepatic substance which are too small to characterize. No follow-up imaging is needed in a low risk patient. There is no mass enhancement, laceration or perihepatic hematoma. Gallbladder and bile ducts are unremarkable. Pancreas: Partially atrophic.  No mass.  No ductal dilatation. Spleen: There is a 3 cm in length grade 2 linear laceration in the superior aspect of the spleen, extending from anterior to posterior. There is a small subcapsular hematoma at the superior aspect of the laceration, measuring up to 2 cm. In the medial lower spleen there is a 1.5 cm cyst, Hounsfield density is 13. No other focal abnormality. Adrenals/Urinary Tract: Slight nodular thickening both adrenal glands. Cortical thinning noted in both kidneys with scattered tiny Bosniak 2 bilateral cortical cysts which are too small to characterize. No follow-up imaging is recommended. No adrenal or perirenal hemorrhage or renal laceration is seen. There is no urinary stone or obstruction. The bladder is contracted and not well seen but there are no inflammatory changes around it. The wall does appear more thickened than expected for contraction. Correlate clinically for underlying cystitis. There is pelvic floor laxity without evidence of cystocele. Stomach/Bowel: Negative gastric wall, unopacified small bowel and appendix. Diffuse colonic diverticulosis. There is faint interstitial haziness around the distal descending colon, which could be fat scarring from prior diverticular disease or could indicate a mild acute diverticulitis. There is no free air or abscess associated with this. Vascular/Lymphatic: Moderate to aortoiliac calcific plaque. No AAA. No dissection. Bilateral pelvic venous congestion extends to the outer  uterine walls. No lymphadenopathy is seen. Reproductive: Uterus and bilateral adnexa are unremarkable. Other: No free hemorrhage, free ascites, or free air is seen. No incarcerated hernias. Musculoskeletal: There is a chronic mild upper plate L1 anterior wedge compression fracture deformity, which was seen  on the prior PA and lateral chest and unchanged. Osteopenia and degenerative change of the spine is noted with advanced L5-S1 facet hypertrophy. No acute regional skeletal fracture. IMPRESSION: 1. 3 cm in length grade 2 linear laceration in the superior aspect of the spleen with a small subcapsular hematoma at the superior aspect of the laceration. 2. No other acute trauma related findings in the chest, abdomen or pelvis. 3. Aortic and coronary artery atherosclerosis, including calcifications and thickening of the aortic valve leaflets. 4. Enlarged pulmonary trunk indicating arterial hypertension. 5. 1.6 cm heterogeneous solid nodule right lobe of the thyroid  gland. This would ordinarily warrant a nonemergent ultrasound follow-up recommendation, but this should only be done if clinically warranted given her advanced age. 6. Diffuse bronchial thickening consistent with bronchitis. 7. Cystitis versus bladder nondistention. 8. Diverticulosis with faint interstitial haziness around the distal descending colon, which could be fat scarring from prior diverticular disease or could indicate a mild acute diverticulitis. No free air or abscess. 9. Scattered tiny hypodensities in the liver, too small to characterize with CT. No follow-up imaging is needed in a low risk patient. 10. Pelvic venous congestion. 11. Osteopenia and degenerative change. Chronic L1 compression fracture. 12. Critical Value/emergent results were called by telephone at the time of interpretation on 01/10/2024 at 3:26 am to provider [facility] , who verbally acknowledged these results. Aortic Atherosclerosis (ICD10-I70.0). Electronically Signed   By:  Francis Quam M.D.   On: 01/10/2024 03:35   CT Head Wo Contrast Result Date: 01/10/2024 EXAM: CT HEAD, FACIAL BONES AND CERVICAL SPINE WITHOUT CONTRAST 01/10/2024 02:36:58 AM TECHNIQUE: CT of the head, facial bones and cervical spine was performed without the administration of intravenous contrast. Multiplanar reformatted images are provided for review. Automated exposure control, iterative reconstruction, and/or weight based adjustment of the mA/kV was utilized to reduce the radiation dose to as low as reasonably achievable. COMPARISON: MRI brain dated 02/21/2015. CLINICAL HISTORY: Fall. FINDINGS: CT HEAD BRAIN AND VENTRICLES: Global cortical atrophy with prominent left extra-axial CSF space, chronic. No acute intracranial hemorrhage. No mass effect or midline shift. No extra-axial fluid collection. No evidence of acute infarct. No hydrocephalus. SKULL AND SCALP: No acute skull fracture. No scalp hematoma. CT FACIAL BONES FACIAL BONES: No acute facial fracture. No mandibular dislocation. No suspicious bone lesion. ORBITS: No acute traumatic injury. SINUSES AND MASTOIDS: No acute abnormality. SOFT TISSUES: No acute abnormality. CT CERVICAL SPINE BONES AND ALIGNMENT: No acute fracture or traumatic malalignment. DEGENERATIVE CHANGES: Mild degenerative changes at C3-4. SOFT TISSUES: No prevertebral soft tissue swelling. VASCULATURE: Intracranial atherosclerosis. CALCIFICATIONS: Bilateral basal ganglia calcifications. IMPRESSION: 1. No acute intracranial abnormality. 2. No evidence of maxillofacial fracture. 3. No acute traumatic injury to the cervical spine. Electronically signed by: Pinkie Pebbles MD 01/10/2024 02:52 AM EDT RP Workstation: [id number]   CT Cervical Spine Wo Contrast Result Date: 01/10/2024 EXAM: CT HEAD, FACIAL BONES AND CERVICAL SPINE WITHOUT CONTRAST 01/10/2024 02:36:58 AM TECHNIQUE: CT of the head, facial bones and cervical spine was performed without the administration of intravenous  contrast. Multiplanar reformatted images are provided for review. Automated exposure control, iterative reconstruction, and/or weight based adjustment of the mA/kV was utilized to reduce the radiation dose to as low as reasonably achievable. COMPARISON: MRI brain dated 02/21/2015. CLINICAL HISTORY: Fall. FINDINGS: CT HEAD BRAIN AND VENTRICLES: Global cortical atrophy with prominent left extra-axial CSF space, chronic. No acute intracranial hemorrhage. No mass effect or midline shift. No extra-axial fluid collection. No evidence of acute infarct. No hydrocephalus. SKULL AND  SCALP: No acute skull fracture. No scalp hematoma. CT FACIAL BONES FACIAL BONES: No acute facial fracture. No mandibular dislocation. No suspicious bone lesion. ORBITS: No acute traumatic injury. SINUSES AND MASTOIDS: No acute abnormality. SOFT TISSUES: No acute abnormality. CT CERVICAL SPINE BONES AND ALIGNMENT: No acute fracture or traumatic malalignment. DEGENERATIVE CHANGES: Mild degenerative changes at C3-4. SOFT TISSUES: No prevertebral soft tissue swelling. VASCULATURE: Intracranial atherosclerosis. CALCIFICATIONS: Bilateral basal ganglia calcifications. IMPRESSION: 1. No acute intracranial abnormality. 2. No evidence of maxillofacial fracture. 3. No acute traumatic injury to the cervical spine. Electronically signed by: Pinkie Pebbles MD 01/10/2024 02:52 AM EDT RP Workstation: [id number]   CT Maxillofacial Wo Contrast Result Date: 01/10/2024 EXAM: CT HEAD, FACIAL BONES AND CERVICAL SPINE WITHOUT CONTRAST 01/10/2024 02:36:58 AM TECHNIQUE: CT of the head, facial bones and cervical spine was performed without the administration of intravenous contrast. Multiplanar reformatted images are provided for review. Automated exposure control, iterative reconstruction, and/or weight based adjustment of the mA/kV was utilized to reduce the radiation dose to as low as reasonably achievable. COMPARISON: MRI brain dated 02/21/2015. CLINICAL HISTORY:  Fall. FINDINGS: CT HEAD BRAIN AND VENTRICLES: Global cortical atrophy with prominent left extra-axial CSF space, chronic. No acute intracranial hemorrhage. No mass effect or midline shift. No extra-axial fluid collection. No evidence of acute infarct. No hydrocephalus. SKULL AND SCALP: No acute skull fracture. No scalp hematoma. CT FACIAL BONES FACIAL BONES: No acute facial fracture. No mandibular dislocation. No suspicious bone lesion. ORBITS: No acute traumatic injury. SINUSES AND MASTOIDS: No acute abnormality. SOFT TISSUES: No acute abnormality. CT CERVICAL SPINE BONES AND ALIGNMENT: No acute fracture or traumatic malalignment. DEGENERATIVE CHANGES: Mild degenerative changes at C3-4. SOFT TISSUES: No prevertebral soft tissue swelling. VASCULATURE: Intracranial atherosclerosis. CALCIFICATIONS: Bilateral basal ganglia calcifications. IMPRESSION: 1. No acute intracranial abnormality. 2. No evidence of maxillofacial fracture. 3. No acute traumatic injury to the cervical spine. Electronically signed by: Pinkie Pebbles MD 01/10/2024 02:52 AM EDT RP Workstation: [id number]   ECHOCARDIOGRAM COMPLETE Result Date: 01/06/2024    ECHOCARDIOGRAM REPORT   Patient Name:   ALIZZON DIOGUARDI Date of Exam: 01/06/2024 Medical Rec #:  [id number]     Height:       59.0 in Accession #:    [id number]    Weight:       121.0 lb Date of Birth:  30-Jun-1932    BSA:          1.490 m Patient Age:    89 years      BP:           134/77 mmHg Patient Gender: F             HR:           67 bpm. Exam Location:  [facility] Procedure: 2D Echo, 3D Echo, Cardiac Doppler and Color Doppler (Both Spectral            and Color Flow Doppler were utilized during procedure). Indications:    Edema R60.9, Murmur R01  History:        Patient has no prior history of Echocardiogram examinations. TIA                 and PAD, Signs/Symptoms:Murmur and Edema; Risk                 Factors:Hypertension.  Sonographer:    BERNARDA ROCKS Referring Phys: [id number] JOSHUA A  [place] IMPRESSIONS  1. Left ventricular ejection fraction, by estimation, is 55 to 60%. The left ventricle  has normal function. The left ventricle has no regional wall motion abnormalities. Left ventricular diastolic parameters were normal.  2. Right ventricular systolic function is mildly reduced. The right ventricular size is normal. Tricuspid regurgitation signal is inadequate for assessing PA pressure.  3. The mitral valve is degenerative. Mild mitral valve regurgitation. No evidence of mitral stenosis.  4. The aortic valve is bicuspid. Aortic valve regurgitation is trivial. Moderate aortic valve stenosis. Aortic valve area, by VTI measures 1.06 cm. Aortic valve mean gradient measures 8.7 mmHg. Aortic valve Vmax measures 2.19 m/s. Although the mean AVG  and Vmax are c/w no stenosis, the DI is reduced at 0.34 and SVI low at 33. Findings most consistent with mild to moderate low flow low gradient aortic stenosis. Visually there is reduced leaflet excursion with probable bicuspid AV.  5. Aortic dilatation noted. There is mild dilatation of the ascending aorta, measuring 38 mm.  6. The inferior vena cava is normal in size with greater than 50% respiratory variability, suggesting right atrial pressure of 3 mmHg. FINDINGS  Left Ventricle: Left ventricular ejection fraction, by estimation, is 55 to 60%. The left ventricle has normal function. The left ventricle has no regional wall motion abnormalities. The left ventricular internal cavity size was normal in size. There is  no left ventricular hypertrophy. Left ventricular diastolic parameters were normal. Normal left ventricular filling pressure. Right Ventricle: The right ventricular size is normal. No increase in right ventricular wall thickness. Right ventricular systolic function is mildly reduced. Tricuspid regurgitation signal is inadequate for assessing PA pressure. Left Atrium: Left atrial size was normal in size. Right Atrium: Right atrial size was normal  in size. Pericardium: There is no evidence of pericardial effusion. Mitral Valve: The mitral valve is degenerative in appearance. There is mild calcification of the mitral valve leaflet(s). Mild mitral annular calcification. Mild mitral valve regurgitation. No evidence of mitral valve stenosis. MV peak gradient, 8.4 mmHg. The mean mitral valve gradient is 3.0 mmHg. Tricuspid Valve: The tricuspid valve is normal in structure. Tricuspid valve regurgitation is not demonstrated. No evidence of tricuspid stenosis. Aortic Valve: The aortic valve is bicuspid. Aortic valve regurgitation is trivial. Moderate aortic stenosis is present. Aortic valve mean gradient measures 8.7 mmHg. Aortic valve peak gradient measures 19.2 mmHg. Aortic valve area, by VTI measures 1.06 cm. Pulmonic Valve: The pulmonic valve was normal in structure. Pulmonic valve regurgitation is not visualized. No evidence of pulmonic stenosis. Aorta: Aortic dilatation noted. There is mild dilatation of the ascending aorta, measuring 38 mm. Venous: The inferior vena cava is normal in size with greater than 50% respiratory variability, suggesting right atrial pressure of 3 mmHg. IAS/Shunts: No atrial level shunt detected by color flow Doppler. Additional Comments: 3D was performed not requiring image post processing on an independent workstation and was normal.  LEFT VENTRICLE PLAX 2D LVIDd:         4.30 cm     Diastology LVIDs:         3.00 cm     LV e' medial:    12.00 cm/s LV PW:         0.90 cm     LV E/e' medial:  11.5 LV IVS:        1.00 cm     LV e' lateral:   11.70 cm/s LVOT diam:     2.00 cm     LV E/e' lateral: 11.8 LV SV:         49 LV SV Index:  33 LVOT Area:     3.14 cm  LV Volumes (MOD) LV vol d, MOD A2C: 90.4 ml LV vol d, MOD A4C: 92.5 ml LV vol s, MOD A2C: 36.1 ml LV vol s, MOD A4C: 38.4 ml LV SV MOD A2C:     54.3 ml LV SV MOD A4C:     92.5 ml LV SV MOD BP:      53.9 ml RIGHT VENTRICLE             IVC RV Basal diam:  3.70 cm     IVC diam:  1.00 cm RV S prime:     11.30 cm/s TAPSE (M-mode): 1.2 cm LEFT ATRIUM            Index        RIGHT ATRIUM           Index LA diam:      4.50 cm  3.02 cm/m   RA Area:     12.40 cm LA Vol (A2C): 110.0 ml 73.84 ml/m  RA Volume:   25.30 ml  16.98 ml/m LA Vol (A4C): 42.1 ml  28.26 ml/m  AORTIC VALVE                     PULMONIC VALVE AV Area (Vmax):    0.98 cm      PV Vmax:          0.97 m/s AV Area (Vmean):   1.01 cm      PV Peak grad:     3.7 mmHg AV Area (VTI):     1.06 cm      PR End Diast Vel: 4.08 msec AV Vmax:           219.33 cm/s AV Vmean:          138.000 cm/s AV VTI:            0.461 m AV Peak Grad:      19.2 mmHg AV Mean Grad:      8.7 mmHg LVOT Vmax:         68.70 cm/s LVOT Vmean:        44.400 cm/s LVOT VTI:          0.156 m LVOT/AV VTI ratio: 0.34  AORTA Ao Root diam: 3.20 cm Ao Asc diam:  3.80 cm MITRAL VALVE MV Area (PHT): 4.21 cm     SHUNTS MV Area VTI:   1.96 cm     Systemic VTI:  0.16 m MV Peak grad:  8.4 mmHg     Systemic Diam: 2.00 cm MV Mean grad:  3.0 mmHg MV Vmax:       1.45 m/s MV Vmean:      83.9 cm/s MV Decel Time: 180 msec MR Peak grad: 95.1 mmHg MR Mean grad: 59.0 mmHg MR Vmax:      487.50 cm/s MR Vmean:     361.0 cm/s MV E velocity: 138.00 cm/s MV A velocity: 44.30 cm/s MV E/A ratio:  3.12 Wilbert Bihari MD Electronically signed by Wilbert Bihari MD Signature Date/Time: 01/06/2024/1:37:10 PM    Final    US  Venous Img Lower Unilateral Right (DVT) Result Date: 12/26/2023 CLINICAL DATA:  Acute right lower extremity swelling. EXAM: Right LOWER EXTREMITY VENOUS DOPPLER ULTRASOUND TECHNIQUE: Gray-scale sonography with compression, as well as color and duplex ultrasound, were performed to evaluate the deep venous system(s) from the level of the common femoral vein through the popliteal and proximal calf veins. COMPARISON:  None  Available. FINDINGS: VENOUS Normal compressibility of the common femoral, superficial femoral, and popliteal veins, as well as the visualized calf veins. Visualized  portions of profunda femoral vein and great saphenous vein unremarkable. No filling defects to suggest DVT on grayscale or color Doppler imaging. Doppler waveforms show normal direction of venous flow, normal respiratory plasticity and response to augmentation. Limited views of the contralateral common femoral vein are unremarkable. OTHER None. Limitations: none IMPRESSION: Negative. Electronically Signed   By: Lynwood Landy Raddle M.D.   On: 12/26/2023 11:50   DG Tibia/Fibula Right Result Date: 12/24/2023 CLINICAL DATA:  Right leg pain and swelling EXAM: RIGHT TIBIA AND FIBULA - 2 VIEW COMPARISON:  None Available. FINDINGS: Normal alignment. No acute fracture or dislocation. Advanced degenerative changes are seen within the right ankle, not well profiled on this examination. Mild degenerative changes are seen within the right knee. There is diffuse subcutaneous edema within the right lower extremity. Vascular calcifications noted. IMPRESSION: 1. Diffuse subcutaneous edema. No acute fracture or dislocation. Electronically Signed   By: Dorethia Molt M.D.   On: 12/24/2023 21:10    Subjective: Patient was seen and examined at bedside.  Overnight events noted. Patient reports feeling much improved, she wants to be discharged home. Home health services arranged.  Discharge Exam: Vitals:   01/14/24 0422 01/14/24 0751  BP: (!) 119/105 118/67  Pulse: 83 90  Resp: 15 16  Temp: 99.1 F (37.3 C) 97.7 F (36.5 C)  SpO2: 100% 93%   Vitals:   01/13/24 1501 01/13/24 2015 01/14/24 0422 01/14/24 0751  BP: (!) 111/58 (!) 101/91 (!) 119/105 118/67  Pulse: 87 96 83 90  Resp: 17 18 15 16   Temp: 97.7 F (36.5 C) (!) 97.5 F (36.4 C) 99.1 F (37.3 C) 97.7 F (36.5 C)  TempSrc:   Oral   SpO2: 96% 97% 100% 93%  Weight:      Height:        General: Pt is alert, awake, not in acute distress Cardiovascular: RRR, S1/S2 +, no rubs, no gallops Respiratory: CTA bilaterally, no wheezing, no rhonchi Abdominal:  Soft, NT, ND, bowel sounds + Extremities: no edema, no cyanosis    The results of significant diagnostics from this hospitalization (including imaging, microbiology, ancillary and laboratory) are listed below for reference.     Microbiology: No results found for this or any previous visit (from the past 240 hours).   Labs: BNP (last 3 results) No results for input(s): BNP in the last 8760 hours. Basic Metabolic Panel: Recent Labs  Lab 01/10/24 0131 01/11/24 0654 01/12/24 1110 01/14/24 0539  NA 134* 132* 136 136  K 4.1 3.8 3.6 3.4*  CL 99 103 105 108  CO2 21* 20* 21* 20*  GLUCOSE 116* 92 85 92  BUN 25* 8 9 11   CREATININE 0.95 0.70 0.75 0.89  CALCIUM  9.0 7.7* 8.0* 8.1*  MG  --   --   --  2.0  PHOS  --   --   --  3.0   Liver Function Tests: Recent Labs  Lab 01/10/24 0131 01/11/24 0654  AST 25 18  ALT 14 13  ALKPHOS 104 66  BILITOT 0.6 0.9  PROT 6.9 5.7*  ALBUMIN 3.8 2.6*   No results for input(s): LIPASE, AMYLASE in the last 168 hours. No results for input(s): AMMONIA in the last 168 hours. CBC: Recent Labs  Lab 01/10/24 0131 01/10/24 1033 01/11/24 0654 01/11/24 2006 01/12/24 1110 01/13/24 0430 01/14/24 0539  WBC 11.8*  --  7.0 7.4 6.9 6.2 8.3  NEUTROABS 9.7*  --   --   --   --   --   --   HGB 11.2*   < > 9.7* 10.5* 10.6* 10.4* 11.0*  HCT 33.6*   < > 29.5* 32.1* 32.2* 30.9* 33.3*  MCV 84.2  --  84.0 84.3 83.6 83.1 83.7  PLT 302  --  226 257 240 250 293   < > = values in this interval not displayed.   Cardiac Enzymes: No results for input(s): CKTOTAL, CKMB, CKMBINDEX, TROPONINI in the last 168 hours. BNP: Invalid input(s): POCBNP CBG: No results for input(s): GLUCAP in the last 168 hours. D-Dimer No results for input(s): DDIMER in the last 72 hours. Hgb A1c No results for input(s): HGBA1C in the last 72 hours. Lipid Profile No results for input(s): CHOL, HDL, LDLCALC, TRIG, CHOLHDL, LDLDIRECT in the last 72  hours. Thyroid  function studies Recent Labs    01/12/24 0441  TSH 4.917*   Anemia work up No results for input(s): VITAMINB12, FOLATE, FERRITIN, TIBC, IRON, RETICCTPCT in the last 72 hours. Urinalysis    Component Value Date/Time   COLORURINE YELLOW 04/26/2018 1005   APPEARANCEUR CLEAR 04/26/2018 1005   LABSPEC 1.023 04/26/2018 1005   PHURINE < OR = 5.0 04/26/2018 1005   GLUCOSEU NEGATIVE 04/26/2018 1005   HGBUR NEGATIVE 04/26/2018 1005   BILIRUBINUR NEGATIVE 02/21/2015 1640   BILIRUBINUR neg 12/08/2014 1120   KETONESUR NEGATIVE 04/26/2018 1005   PROTEINUR NEGATIVE 04/26/2018 1005   UROBILINOGEN 1.0 02/21/2015 1640   NITRITE NEGATIVE 04/26/2018 1005   LEUKOCYTESUR 1+ (A) 04/26/2018 1005   Sepsis Labs Recent Labs  Lab 01/11/24 2006 01/12/24 1110 01/13/24 0430 01/14/24 0539  WBC 7.4 6.9 6.2 8.3   Microbiology No results found for this or any previous visit (from the past 240 hours).   Time coordinating discharge: Over 30 minutes  SIGNED:   Darcel Dawley, MD  [facility] 01/14/2024, 3:04 PM Pager   If 7PM-7AM, please contact night-coverage

## 2024-01-14 NOTE — Progress Notes (Signed)
    Durable Medical Equipment  (From admission, onward)           Start     Ordered   01/14/24 1053  For home use only DME lightweight manual wheelchair with seat cushion  Once       Comments: Patient suffers from  fx of R medial malleolus and distal tibia  which impairs their ability to perform daily activities like bathing in the home.  A walker will not resolve  issue with performing activities of daily living. A wheelchair will allow patient to safely perform daily activities. Patient is not able to propel themselves in the home using a standard weight wheelchair due to general weakness. Patient can self propel in the lightweight wheelchair. Length of need 6 months . Accessories: elevating leg rests (ELRs), wheel locks, extensions and anti-tippers.   01/14/24 1053   01/14/24 1053  For home use only DME Bedside commode  Once       Comments: Confined to one room  Question:  Patient needs a bedside commode to treat with the following condition  Answer:  Fx   01/14/24 1053

## 2024-01-15 ENCOUNTER — Telehealth: Payer: Self-pay

## 2024-01-15 DIAGNOSIS — S8254XA Nondisplaced fracture of medial malleolus of right tibia, initial encounter for closed fracture: Secondary | ICD-10-CM | POA: Diagnosis not present

## 2024-01-15 DIAGNOSIS — S36039S Unspecified laceration of spleen, sequela: Secondary | ICD-10-CM | POA: Diagnosis not present

## 2024-01-15 DIAGNOSIS — M810 Age-related osteoporosis without current pathological fracture: Secondary | ICD-10-CM | POA: Diagnosis not present

## 2024-01-15 DIAGNOSIS — R55 Syncope and collapse: Secondary | ICD-10-CM | POA: Diagnosis not present

## 2024-01-15 NOTE — Transitions of Care (Post Inpatient/ED Visit) (Signed)
   01/15/2024  Name: [name] MRN: [id number] DOB: 12-27-1932  Today's TOC FU Call Status: Today's TOC FU Call Status:: Successful TOC FU Call Completed TOC FU Call Complete Date: 01/15/24 Patient's Name and Date of Birth confirmed.  Transition Care Management Follow-up Telephone Call Date of Discharge: 01/14/24 Discharge Facility: [facility]) Type of Discharge: Inpatient Admission Primary Inpatient Discharge Diagnosis:: laceration of spleen How have you been since you were released from the hospital?: Better Any questions or concerns?: No  Items Reviewed: Did you receive and understand the discharge instructions provided?: Yes Medications obtained,verified, and reconciled?: Yes (Medications Reviewed) Any new allergies since your discharge?: No Dietary orders reviewed?: Yes Do you have support at home?: Yes People in Home [RPT]: child(ren), adult  Medications Reviewed Today: Medications Reviewed Today     Reviewed by Emmitt Pan, LPN (Licensed Practical Nurse) on 01/15/24 at 1028  Med List Status: <None>   Medication Order Taking? Sig Documenting Provider Last Dose Status Informant  amLODipine  (NORVASC ) 5 MG tablet [id number] Yes Take 1 tablet (5 mg total) by mouth daily. Dettinger, Fonda LABOR, MD  Active Self, Pharmacy Records, Multiple Informants  atorvastatin  (LIPITOR) 80 MG tablet [id number] Yes Take 1 tablet (80 mg total) by mouth daily. Dettinger, Fonda LABOR, MD  Active Self, Pharmacy Records, Multiple Informants  HYDROcodone -acetaminophen  (NORCO/VICODIN) 5-325 MG tablet [id number] Yes Take 1-2 tablets by mouth every 6 (six) hours as needed for moderate pain (pain score 4-6) or severe pain (pain score 7-10). Renae Bernarda HERO, PA-C  Active   loratadine  (CLARITIN ) 10 MG tablet [id number] Yes Take 1 tablet (10 mg total) by mouth daily. Dettinger, Fonda LABOR, MD  Active Self, Pharmacy Records, Multiple Informants  mirtazapine  (REMERON ) 7.5 MG tablet [id number] Yes Take 1  tablet (7.5 mg total) by mouth at bedtime. Leotis Bogus, MD  Active   MYRBETRIQ  25 MG TB24 tablet [id number] Yes Take 1 tablet (25 mg total) by mouth daily. Dettinger, Fonda LABOR, MD  Active Self, Pharmacy Records, Multiple Informants            Home Care and Equipment/Supplies: Were Home Health Services Ordered?: Yes Name of Home Health Agency:: [facility] Has Agency set up a time to come to your home?: No Any new equipment or medical supplies ordered?: NA  Functional Questionnaire: Do you need assistance with bathing/showering or dressing?: Yes Do you need assistance with meal preparation?: Yes Do you need assistance with eating?: No Do you have difficulty maintaining continence: No Do you need assistance with getting out of bed/getting out of a chair/moving?: No Do you have difficulty managing or taking your medications?: Yes  Follow up appointments reviewed: PCP Follow-up appointment confirmed?: Yes Date of PCP follow-up appointment?: 01/26/24 Follow-up Provider: [facility] Follow-up appointment confirmed?: Yes Date of Specialist follow-up appointment?: 01/28/24 Follow-Up Specialty Provider:: ortho Do you need transportation to your follow-up appointment?: No Do you understand care options if your condition(s) worsen?: Yes-patient verbalized understanding    SIGNATURE Pan Emmitt, LPN [facility] Nurse Health Advisor Direct Dial [phone]

## 2024-01-18 ENCOUNTER — Telehealth: Payer: Self-pay | Admitting: Family Medicine

## 2024-01-18 DIAGNOSIS — I4891 Unspecified atrial fibrillation: Secondary | ICD-10-CM | POA: Diagnosis not present

## 2024-01-18 DIAGNOSIS — Z9181 History of falling: Secondary | ICD-10-CM | POA: Diagnosis not present

## 2024-01-18 DIAGNOSIS — I080 Rheumatic disorders of both mitral and aortic valves: Secondary | ICD-10-CM | POA: Diagnosis not present

## 2024-01-18 DIAGNOSIS — Z961 Presence of intraocular lens: Secondary | ICD-10-CM | POA: Diagnosis not present

## 2024-01-18 DIAGNOSIS — I10 Essential (primary) hypertension: Secondary | ICD-10-CM | POA: Diagnosis not present

## 2024-01-18 DIAGNOSIS — E785 Hyperlipidemia, unspecified: Secondary | ICD-10-CM | POA: Diagnosis not present

## 2024-01-18 DIAGNOSIS — E041 Nontoxic single thyroid nodule: Secondary | ICD-10-CM | POA: Diagnosis not present

## 2024-01-18 DIAGNOSIS — Z9841 Cataract extraction status, right eye: Secondary | ICD-10-CM | POA: Diagnosis not present

## 2024-01-18 DIAGNOSIS — Z9842 Cataract extraction status, left eye: Secondary | ICD-10-CM | POA: Diagnosis not present

## 2024-01-18 DIAGNOSIS — Z791 Long term (current) use of non-steroidal anti-inflammatories (NSAID): Secondary | ICD-10-CM | POA: Diagnosis not present

## 2024-01-18 DIAGNOSIS — Z87440 Personal history of urinary (tract) infections: Secondary | ICD-10-CM | POA: Diagnosis not present

## 2024-01-18 DIAGNOSIS — K219 Gastro-esophageal reflux disease without esophagitis: Secondary | ICD-10-CM | POA: Diagnosis not present

## 2024-01-18 DIAGNOSIS — M0579 Rheumatoid arthritis with rheumatoid factor of multiple sites without organ or systems involvement: Secondary | ICD-10-CM | POA: Diagnosis not present

## 2024-01-18 DIAGNOSIS — Z8673 Personal history of transient ischemic attack (TIA), and cerebral infarction without residual deficits: Secondary | ICD-10-CM | POA: Diagnosis not present

## 2024-01-18 DIAGNOSIS — I739 Peripheral vascular disease, unspecified: Secondary | ICD-10-CM | POA: Diagnosis not present

## 2024-01-18 DIAGNOSIS — M19071 Primary osteoarthritis, right ankle and foot: Secondary | ICD-10-CM | POA: Diagnosis not present

## 2024-01-18 DIAGNOSIS — H353131 Nonexudative age-related macular degeneration, bilateral, early dry stage: Secondary | ICD-10-CM | POA: Diagnosis not present

## 2024-01-18 DIAGNOSIS — S36039D Unspecified laceration of spleen, subsequent encounter: Secondary | ICD-10-CM | POA: Diagnosis not present

## 2024-01-18 DIAGNOSIS — M80071D Age-related osteoporosis with current pathological fracture, right ankle and foot, subsequent encounter for fracture with routine healing: Secondary | ICD-10-CM | POA: Diagnosis not present

## 2024-01-18 DIAGNOSIS — Z556 Problems related to health literacy: Secondary | ICD-10-CM | POA: Diagnosis not present

## 2024-01-18 NOTE — Telephone Encounter (Signed)
 VO given to add SW eval

## 2024-01-18 NOTE — Telephone Encounter (Signed)
 Copied from CRM [id number]. Topic: Clinical - Home Health Verbal Orders >> Jan 18, 2024  2:52 PM Hamdi H wrote: Caller/Agency: [facility] Call[facility] Number: [phone] Service Requested: Physical Therapy Frequency: 2 times a week for 3 weeks and 1 week 6  Any new concerns about the patient? Yes, no bowel movement since 9/21.  Service Requested: Home health aide  Frequency: 1 week 6

## 2024-01-18 NOTE — Telephone Encounter (Signed)
 Copied from CRM [id number]. Topic: Referral - Request for Referral >> Jan 18, 2024  2:56 PM Hamdi H wrote: Did the patient discuss referral with their provider in the last year? No (If No - schedule appointment) (If Yes - send message)  Appointment offered? No, home health nurse is calling   Type of order/referral and detailed reason for visit: Social worker   Preference of office, provider, location: [facility]   If referral order, have you been seen by this specialty before? No (If Yes, this issue or another issue? When? Where?  Can we respond through MyChart? No

## 2024-01-18 NOTE — Telephone Encounter (Signed)
 Left secure voicemail with verbal okay

## 2024-01-18 NOTE — Telephone Encounter (Signed)
 Copied from CRM #[id number]. Topic: Clinical - Home Health Verbal Orders >> Jan 18, 2024  4:26 PM Leonette SQUIBB wrote: Caller/Agency: [facility] Number: [phone]  Service Requested: Social worker to evaluate   Any new concerns about the patient? No

## 2024-01-18 NOTE — Telephone Encounter (Signed)
 Left detailed message for Lorie with [facility], giving verbal okay for PT with freq of 2x per wk for 3 wks and 1x per wk for 6 wks. Also gave verbal okay for Home Health aide with freq of 1x per wk for 6 wks.

## 2024-01-19 ENCOUNTER — Telehealth: Payer: Self-pay | Admitting: Family Medicine

## 2024-01-19 NOTE — Telephone Encounter (Signed)
 I called and spoke with [name] and confirmed with him that we are currently working with [name] from [facility] and have given permission for patient to have PT and Home Health Aide. [name] says patient really needs someone in the home full time to help because patient can't do much for herself and its been hard making sure someone in the family is always with patient. He is interested in seeing if patient qualifies for CAP Program and needed info on how to go about this.  After speaking with nurse and pharmacist, I confirmed with patient that he would need to contact patients Medicaid Social Worker and let them know that they're interested in the CAP program for patient and they could get him to the right person to speak with to get more info on the CAP program and they can send us  the paperwork to fill out and send back to them to determine if patient qualifies. [name] voiced understanding and will work on this.

## 2024-01-19 NOTE — Telephone Encounter (Signed)
 Copied from CRM #[id number]. Topic: Clinical - Medical Advice >> Jan 19, 2024  3:18 PM Delon DASEN wrote: Reason for CRM: son Christell calling, was advised by insurance that patient needs PCA services or home health- please call him [phone]

## 2024-01-25 ENCOUNTER — Ambulatory Visit: Admitting: Family Medicine

## 2024-01-26 ENCOUNTER — Encounter: Payer: Self-pay | Admitting: Nurse Practitioner

## 2024-01-26 ENCOUNTER — Ambulatory Visit: Admitting: Nurse Practitioner

## 2024-01-26 VITALS — BP 114/66 | HR 83 | Temp 97.1°F | Ht <= 58 in | Wt 118.0 lb

## 2024-01-26 DIAGNOSIS — Z87898 Personal history of other specified conditions: Secondary | ICD-10-CM

## 2024-01-26 DIAGNOSIS — I10 Essential (primary) hypertension: Secondary | ICD-10-CM | POA: Diagnosis not present

## 2024-01-26 DIAGNOSIS — D649 Anemia, unspecified: Secondary | ICD-10-CM | POA: Diagnosis not present

## 2024-01-26 DIAGNOSIS — E561 Deficiency of vitamin K: Secondary | ICD-10-CM

## 2024-01-26 DIAGNOSIS — I4891 Unspecified atrial fibrillation: Secondary | ICD-10-CM | POA: Diagnosis not present

## 2024-01-26 DIAGNOSIS — R7989 Other specified abnormal findings of blood chemistry: Secondary | ICD-10-CM | POA: Diagnosis not present

## 2024-01-26 DIAGNOSIS — S8254XA Nondisplaced fracture of medial malleolus of right tibia, initial encounter for closed fracture: Secondary | ICD-10-CM

## 2024-01-26 DIAGNOSIS — S36039S Unspecified laceration of spleen, sequela: Secondary | ICD-10-CM | POA: Diagnosis not present

## 2024-01-26 DIAGNOSIS — R55 Syncope and collapse: Secondary | ICD-10-CM | POA: Diagnosis not present

## 2024-01-26 DIAGNOSIS — S36039A Unspecified laceration of spleen, initial encounter: Secondary | ICD-10-CM | POA: Insufficient documentation

## 2024-01-26 DIAGNOSIS — E041 Nontoxic single thyroid nodule: Secondary | ICD-10-CM

## 2024-01-26 MED ORDER — AMLODIPINE BESYLATE 2.5 MG PO TABS: 2.5000 mg | ORAL_TABLET | Freq: Every day | ORAL | 0 refills | Status: DC

## 2024-01-26 NOTE — Progress Notes (Signed)
 Subjective:  Patient ID: [name], female    DOB: 1932/06/06, 89 y.o.   MRN: [id number]  Patient Care Team: Dettinger, Fonda LABOR, MD as PCP - General (Family Medicine)   Chief Complaint:  Hospitalization Follow-up Orelia to hospital 9/21 for a fall, fracture to right ankle and splenic laceration )   HPI: [name] is a 89 y.o. female presenting on 01/26/2024 for Hospitalization Follow-up Orelia to hospital 9/21 for a fall, fracture to right ankle and splenic laceration )   Discussed the use of AI scribe software for clinical note transcription with the patient, who gave verbal consent to proceed.  History of Present Illness [name] is an 89 year old female who presents for hospital discharge follow-up after a right ankle fracture. She is accompanied by her family husband and her son [name] is the interpreter as she only speaks Spanish  She was discharged from [facility] after being admitted for a right ankle fracture sustained from a fall in the bathroom a couple of weeks ago. She was hospitalized for four days and underwent various tests, including an MRI and CT scans. The fracture is currently being managed with a cast applied on January 10, 2024, and she is scheduled for a follow-up on Thursday.  She has a history of atrial fibrillation and was seen by cardiology on January 12, 2024. She was required to wear a long-term cardiac monitor for 14 days, but it came off after a little over a week. She is not currently on any blood thinners due to a laceration on her spleen.  She experiences tiredness/dizziness, which she associates with her blood pressure medication, amlodipine , taken at a dose of 5 mg daily. Her recent blood pressure reading was 114/66 mmHg. She is drinking about four glasses of water a day, and her family is encouraging her to increase her fluid intake to address potential dehydration.  Recent lab work from the hospital on January 14, 2024,  showed low potassium and calcium  levels, as well as low H&H. Her hemoglobin and hematocrit levels have slightly increased compared to previous labs from 13 days ago. She is not currently on Synthroid, but her thyroid  function tests showed an elevated TSH on January 29, 2024.  The findings during her first hospital stay, CT scan showed 1.6 cm heterogeneous solid nodule right lobe of the thyroid  gland    She has been experiencing poor appetite, eating only once a day, and her family is trying to encourage her to eat more. She has been taking over-the-counter vitamin D  supplements, but her calcium  intake is not specified. She is due for her Prolia  shot, which she receives every six months, but the family is unsure if it is past due.  She was d/c with HH, and order fro w/c and bedside commode. her son reports that they only been to the house once, advise to call them and discuss schedule  CT Angiography Head/Neck w/ & w/o Contrast (01/12/2024) No acute intracranial hemorrhage or infarct. 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 Mild-to-moderate basilar artery stenosis (new). Cervical carotid atherosclerosis without significant stenosis. Beading of cervical ICAs (R>L), consistent with fibromuscular dysplasia. Mild aortic atherosclerosis; 1.4 cm right thyroid  nodule (no follow-up needed).  MRI Brain w/o Contrast (01/11/2024) No acute infarct or mass. Mild chronic small vessel ischemic disease and microhemorrhages suggest chronic hypertensive microangiopathy. Signal abnormality in left vertebral artery V3 segment ? high-grade stenosis or occlusion.  Mild generalized cerebral atrophy. Mild right sphenoid sinus disease and small right mastoid effusion.  CT Right Foot w/o Contrast (01/11/2024) No acute fracture or dislocation. Posttraumatic deformity of 1st metatarsal neck. Mild forefoot degenerative changes, muscular  atrophy, and dorsal soft tissue edema.  CT Right Ankle w/o Contrast (01/11/2024) Nondisplaced bimalleolar fracture (medial and distal fibular). Small avulsion fracture of lateral malleolus. Mild degenerative changes, small effusion, and subtalar ankylosis.  X-Ray Right Foot (01/10/2024) Nondisplaced medial malleolar fracture confirmed. Osteopenia with degenerative changes (1st MTP arthrosis, hallux valgus, hammertoes). Soft tissue swelling and vascular calcifications noted.  X-Ray Tibia/Fibula Right (01/10/2024) Nondisplaced intra-articular fracture through medial malleolus. No additional fractures. Marked bone density loss and degenerative changes.  X-Ray Right Shoulder (01/10/2024) Osteopenia and mild degenerative joint changes. No fracture or dislocation.  CT Head w/o Contrast (01/10/2024) No acute intracranial hemorrhage or infarct. Chronic cortical atrophy and left extra-axial CSF prominence. No facial fracture or cervical spine injury.  CT Cervical Spine w/o Contrast (01/10/2024) No acute fracture or malalignment. Mild degenerative changes at C3-C4. Intracranial atherosclerosis and basal ganglia calcifications.  CT Chest/Abdomen/Pelvis w/ Contrast (01/10/2024) Grade 2 splenic laceration (3 cm) with small subcapsular hematoma. No other acute traumatic injury. Aortic and coronary atherosclerosis, pulmonary hypertension. Thyroid  nodule (1.6 cm, R lobe)--follow-up only if clinically warranted. Diverticulosis with possible mild diverticulitis. Cystitis vs. nondistended bladder, chronic L1 compression fracture, osteopenia.  Allergies and medications reviewed and updated. Data reviewed: Chart in Epic.   Past Medical History:  Diagnosis Date   Arthritis of right foot    Hx: UTI (urinary tract infection)    Hypertension    Osteoporosis    Palpitations    Unsteady gait     Past Surgical History:  Procedure Laterality Date   CATARACT EXTRACTION W/PHACO Right 01/01/2016    Procedure: CATARACT EXTRACTION PHACO AND INTRAOCULAR LENS PLACEMENT RIGHT EYE;  Surgeon: Oneil Platts, MD;  Location: [facility];  Service: Ophthalmology;  Laterality: Right;  CDE: 9.69   CATARACT EXTRACTION W/PHACO Left 01/29/2016   Procedure: CATARACT EXTRACTION PHACO AND INTRAOCULAR LENS PLACEMENT (IOC);  Surgeon: Oneil Platts, MD;  Location: [facility];  Service: Ophthalmology;  Laterality: Left;  CDE: 9.32   None      Social History   Socioeconomic History   Marital status: Married    Spouse name: [name]   Number of children: 4   Years of education: Not on file   Highest education level: 3rd grade  Occupational History   Occupation: Homemaker  Tobacco Use   Smoking status: Never   Smokeless tobacco: Never  Vaping Use   Vaping status: Never Used  Substance and Sexual Activity   Alcohol use: No    Alcohol/week: 0.0 standard drinks of alcohol   Drug use: Never   Sexual activity: Not on file  Other Topics Concern   Not on file  Social History Narrative   Lives at home with son  and sister in law and husband.     1 cup coffee per day.   Right-handed.   Social Drivers of Corporate investment banker Strain: Low Risk  (06/12/2023)   Overall Financial Resource Strain (CARDIA)    Difficulty of Paying Living Expenses: Not hard at all  Food Insecurity: No Food Insecurity (01/11/2024)   Hunger Vital Sign    Worried About Running Out of Food in the Last Year: Never true    Ran Out of Food in the Last Year: Never true  Transportation Needs: No Transportation Needs (01/11/2024)  PRAPARE - Administrator, Civil Service (Medical): No    Lack of Transportation (Non-Medical): No  Physical Activity: Insufficiently Active (06/12/2023)   Exercise Vital Sign    Days of Exercise per Week: 3 days    Minutes of Exercise per Session: 30 min  Stress: No Stress Concern Present (06/12/2023)   [employer] of Occupational Health - Occupational Stress Questionnaire    Feeling of Stress  : Not at all  Social Connections: Moderately Integrated (01/11/2024)   Social Connection and Isolation Panel    Frequency of Communication with Friends and Family: More than three times a week    Frequency of Social Gatherings with Friends and Family: Three times a week    Attends Religious Services: 1 to 4 times per year    Active Member of Clubs or Organizations: No    Attends Banker Meetings: Never    Marital Status: Married  Catering manager Violence: Not At Risk (01/11/2024)   Humiliation, Afraid, Rape, and Kick questionnaire    Fear of Current or Ex-Partner: No    Emotionally Abused: No    Physically Abused: No    Sexually Abused: No    Outpatient Encounter Medications as of 01/26/2024  Medication Sig   amLODipine  (NORVASC ) 2.5 MG tablet Take 1 tablet (2.5 mg total) by mouth daily.   atorvastatin  (LIPITOR) 80 MG tablet Take 1 tablet (80 mg total) by mouth daily.   HYDROcodone -acetaminophen  (NORCO/VICODIN) 5-325 MG tablet Take 1-2 tablets by mouth every 6 (six) hours as needed for moderate pain (pain score 4-6) or severe pain (pain score 7-10).   loratadine  (CLARITIN ) 10 MG tablet Take 1 tablet (10 mg total) by mouth daily.   mirtazapine  (REMERON ) 7.5 MG tablet Take 1 tablet (7.5 mg total) by mouth at bedtime.   MYRBETRIQ  25 MG TB24 tablet Take 1 tablet (25 mg total) by mouth daily.   [DISCONTINUED] amLODipine  (NORVASC ) 5 MG tablet Take 1 tablet (5 mg total) by mouth daily. (Patient not taking: Reported on 01/26/2024)   No facility-administered encounter medications on file as of 01/26/2024.    Allergies  Allergen Reactions   Penicillin G Hives   Lisinopril  Swelling    Tongue swelling    Pertinent ROS per HPI, otherwise unremarkable      Objective:  BP 114/66   Pulse 83   Temp (!) 97.1 F (36.2 C) (Temporal)   Ht 4' 10 (1.473 m)   Wt 118 lb (53.5 kg) Comment: from hospital stay  SpO2 97%   BMI 24.66 kg/m    Wt Readings from Last 3 Encounters:   01/26/24 118 lb (53.5 kg)  01/10/24 118 lb 13.3 oz (53.9 kg)  12/31/23 121 lb (54.9 kg)   BP Readings from Last 3 Encounters:  01/26/24 114/66  01/14/24 118/67  12/31/23 134/77    Physical Exam Vitals and nursing note reviewed.  Constitutional:      General: She is not in acute distress. HENT:     Head: Normocephalic and atraumatic.     Right Ear: Tympanic membrane, ear canal and external ear normal. There is no impacted cerumen.     Left Ear: Tympanic membrane, ear canal and external ear normal. There is no impacted cerumen.     Nose: Nose normal.     Mouth/Throat:     Mouth: Mucous membranes are moist.  Eyes:     General: No scleral icterus.    Extraocular Movements: Extraocular movements intact.     Conjunctiva/sclera:  Conjunctivae normal.     Pupils: Pupils are equal, round, and reactive to light.  Cardiovascular:     Heart sounds: Murmur heard.     Systolic murmur is present with a grade of 2/6.     No friction rub. No gallop. No S3 or S4 sounds.  Pulmonary:     Effort: Pulmonary effort is normal.     Breath sounds: Normal breath sounds.  Abdominal:     General: Bowel sounds are normal.     Palpations: Abdomen is soft.  Musculoskeletal:        General: Normal range of motion.     Right lower leg: No edema.     Left lower leg: No edema.     Comments: Right leg cast, no edema, toes cap refill <2 min  Skin:    General: Skin is warm and dry.     Findings: No rash.  Neurological:     Mental Status: She is alert and oriented to person, place, and time.     Comments: W/c fro ambulation  Psychiatric:        Mood and Affect: Mood normal.        Behavior: Behavior normal.        Thought Content: Thought content normal.        Judgment: Judgment normal.    Physical Exam VITALS: BP- 114/66     Results for orders placed or performed during the hospital encounter of 01/10/24  CBC with Differential   Collection Time: 01/10/24  1:31 AM  Result Value Ref Range    WBC 11.8 (H) 4.0 - 10.5 K/uL   RBC 3.99 3.87 - 5.11 MIL/uL   Hemoglobin 11.2 (L) 12.0 - 15.0 g/dL   HCT 66.3 (L) 63.9 - 53.9 %   MCV 84.2 80.0 - 100.0 fL   MCH 28.1 26.0 - 34.0 pg   MCHC 33.3 30.0 - 36.0 g/dL   RDW 85.1 88.4 - 84.4 %   Platelets 302 150 - 400 K/uL   nRBC 0.0 0.0 - 0.2 %   Neutrophils Relative % 83 %   Neutro Abs 9.7 (H) 1.7 - 7.7 K/uL   Lymphocytes Relative 11 %   Lymphs Abs 1.4 0.7 - 4.0 K/uL   Monocytes Relative 6 %   Monocytes Absolute 0.7 0.1 - 1.0 K/uL   Eosinophils Relative 0 %   Eosinophils Absolute 0.0 0.0 - 0.5 K/uL   Basophils Relative 0 %   Basophils Absolute 0.0 0.0 - 0.1 K/uL   Immature Granulocytes 0 %   Abs Immature Granulocytes 0.04 0.00 - 0.07 K/uL  Basic metabolic panel   Collection Time: 01/10/24  1:31 AM  Result Value Ref Range   Sodium 134 (L) 135 - 145 mmol/L   Potassium 4.1 3.5 - 5.1 mmol/L   Chloride 99 98 - 111 mmol/L   CO2 21 (L) 22 - 32 mmol/L   Glucose, Bld 116 (H) 70 - 99 mg/dL   BUN 25 (H) 8 - 23 mg/dL   Creatinine, Ser 9.04 0.44 - 1.00 mg/dL   Calcium  9.0 8.9 - 10.3 mg/dL   GFR, Estimated 57 (L) >60 mL/min   Anion gap 15 5 - 15  Pro Brain natriuretic peptide   Collection Time: 01/10/24  1:31 AM  Result Value Ref Range   Pro Brain Natriuretic Peptide 1,457.0 (H) <300.0 pg/mL  Hepatic function panel   Collection Time: 01/10/24  1:31 AM  Result Value Ref Range   Total Protein 6.9 6.5 -  8.1 g/dL   Albumin 3.8 3.5 - 5.0 g/dL   AST 25 15 - 41 U/L   ALT 14 0 - 44 U/L   Alkaline Phosphatase 104 38 - 126 U/L   Total Bilirubin 0.6 0.0 - 1.2 mg/dL   Bilirubin, Direct 0.3 (H) 0.0 - 0.2 mg/dL   Indirect Bilirubin 0.4 0.3 - 0.9 mg/dL  Troponin T, High Sensitivity   Collection Time: 01/10/24  1:31 AM  Result Value Ref Range   Troponin T High Sensitivity <15 0 - 19 ng/L  Troponin T, High Sensitivity   Collection Time: 01/10/24  3:45 AM  Result Value Ref Range   Troponin T High Sensitivity <15 0 - 19 ng/L  Hemoglobin and  hematocrit, blood   Collection Time: 01/10/24 10:33 AM  Result Value Ref Range   Hemoglobin 10.4 (L) 12.0 - 15.0 g/dL   HCT 69.3 (L) 63.9 - 53.9 %  Hemoglobin and hematocrit, blood   Collection Time: 01/10/24  4:19 PM  Result Value Ref Range   Hemoglobin 10.0 (L) 12.0 - 15.0 g/dL   HCT 70.1 (L) 63.9 - 53.9 %  Hemoglobin and hematocrit, blood   Collection Time: 01/10/24 10:48 PM  Result Value Ref Range   Hemoglobin 9.7 (L) 12.0 - 15.0 g/dL   HCT 71.2 (L) 63.9 - 53.9 %  Protime-INR   Collection Time: 01/11/24  6:54 AM  Result Value Ref Range   Prothrombin Time 16.2 (H) 11.4 - 15.2 seconds   INR 1.2 0.8 - 1.2  CBC   Collection Time: 01/11/24  6:54 AM  Result Value Ref Range   WBC 7.0 4.0 - 10.5 K/uL   RBC 3.51 (L) 3.87 - 5.11 MIL/uL   Hemoglobin 9.7 (L) 12.0 - 15.0 g/dL   HCT 70.4 (L) 63.9 - 53.9 %   MCV 84.0 80.0 - 100.0 fL   MCH 27.6 26.0 - 34.0 pg   MCHC 32.9 30.0 - 36.0 g/dL   RDW 85.0 88.4 - 84.4 %   Platelets 226 150 - 400 K/uL   nRBC 0.0 0.0 - 0.2 %  Comprehensive metabolic panel   Collection Time: 01/11/24  6:54 AM  Result Value Ref Range   Sodium 132 (L) 135 - 145 mmol/L   Potassium 3.8 3.5 - 5.1 mmol/L   Chloride 103 98 - 111 mmol/L   CO2 20 (L) 22 - 32 mmol/L   Glucose, Bld 92 70 - 99 mg/dL   BUN 8 8 - 23 mg/dL   Creatinine, Ser 9.29 0.44 - 1.00 mg/dL   Calcium  7.7 (L) 8.9 - 10.3 mg/dL   Total Protein 5.7 (L) 6.5 - 8.1 g/dL   Albumin 2.6 (L) 3.5 - 5.0 g/dL   AST 18 15 - 41 U/L   ALT 13 0 - 44 U/L   Alkaline Phosphatase 66 38 - 126 U/L   Total Bilirubin 0.9 0.0 - 1.2 mg/dL   GFR, Estimated >39 >39 mL/min   Anion gap 9 5 - 15  CBC   Collection Time: 01/11/24  8:06 PM  Result Value Ref Range   WBC 7.4 4.0 - 10.5 K/uL   RBC 3.81 (L) 3.87 - 5.11 MIL/uL   Hemoglobin 10.5 (L) 12.0 - 15.0 g/dL   HCT 67.8 (L) 63.9 - 53.9 %   MCV 84.3 80.0 - 100.0 fL   MCH 27.6 26.0 - 34.0 pg   MCHC 32.7 30.0 - 36.0 g/dL   RDW 85.1 88.4 - 84.4 %   Platelets 257 150 -  400  K/uL   nRBC 0.0 0.0 - 0.2 %  TSH   Collection Time: 01/12/24  4:41 AM  Result Value Ref Range   TSH 4.917 (H) 0.350 - 4.500 uIU/mL  CBC   Collection Time: 01/12/24 11:10 AM  Result Value Ref Range   WBC 6.9 4.0 - 10.5 K/uL   RBC 3.85 (L) 3.87 - 5.11 MIL/uL   Hemoglobin 10.6 (L) 12.0 - 15.0 g/dL   HCT 67.7 (L) 63.9 - 53.9 %   MCV 83.6 80.0 - 100.0 fL   MCH 27.5 26.0 - 34.0 pg   MCHC 32.9 30.0 - 36.0 g/dL   RDW 85.0 88.4 - 84.4 %   Platelets 240 150 - 400 K/uL   nRBC 0.0 0.0 - 0.2 %  Basic metabolic panel   Collection Time: 01/12/24 11:10 AM  Result Value Ref Range   Sodium 136 135 - 145 mmol/L   Potassium 3.6 3.5 - 5.1 mmol/L   Chloride 105 98 - 111 mmol/L   CO2 21 (L) 22 - 32 mmol/L   Glucose, Bld 85 70 - 99 mg/dL   BUN 9 8 - 23 mg/dL   Creatinine, Ser 9.24 0.44 - 1.00 mg/dL   Calcium  8.0 (L) 8.9 - 10.3 mg/dL   GFR, Estimated >39 >39 mL/min   Anion gap 10 5 - 15  CBC   Collection Time: 01/13/24  4:30 AM  Result Value Ref Range   WBC 6.2 4.0 - 10.5 K/uL   RBC 3.72 (L) 3.87 - 5.11 MIL/uL   Hemoglobin 10.4 (L) 12.0 - 15.0 g/dL   HCT 69.0 (L) 63.9 - 53.9 %   MCV 83.1 80.0 - 100.0 fL   MCH 28.0 26.0 - 34.0 pg   MCHC 33.7 30.0 - 36.0 g/dL   RDW 85.1 88.4 - 84.4 %   Platelets 250 150 - 400 K/uL   nRBC 0.0 0.0 - 0.2 %  CBC   Collection Time: 01/14/24  5:39 AM  Result Value Ref Range   WBC 8.3 4.0 - 10.5 K/uL   RBC 3.98 3.87 - 5.11 MIL/uL   Hemoglobin 11.0 (L) 12.0 - 15.0 g/dL   HCT 66.6 (L) 63.9 - 53.9 %   MCV 83.7 80.0 - 100.0 fL   MCH 27.6 26.0 - 34.0 pg   MCHC 33.0 30.0 - 36.0 g/dL   RDW 85.2 88.4 - 84.4 %   Platelets 293 150 - 400 K/uL   nRBC 0.0 0.0 - 0.2 %  Magnesium   Collection Time: 01/14/24  5:39 AM  Result Value Ref Range   Magnesium 2.0 1.7 - 2.4 mg/dL  Basic metabolic panel with GFR   Collection Time: 01/14/24  5:39 AM  Result Value Ref Range   Sodium 136 135 - 145 mmol/L   Potassium 3.4 (L) 3.5 - 5.1 mmol/L   Chloride 108 98 - 111 mmol/L   CO2  20 (L) 22 - 32 mmol/L   Glucose, Bld 92 70 - 99 mg/dL   BUN 11 8 - 23 mg/dL   Creatinine, Ser 9.10 0.44 - 1.00 mg/dL   Calcium  8.1 (L) 8.9 - 10.3 mg/dL   GFR, Estimated >39 >39 mL/min   Anion gap 8 5 - 15  Phosphorus   Collection Time: 01/14/24  5:39 AM  Result Value Ref Range   Phosphorus 3.0 2.5 - 4.6 mg/dL       Pertinent labs & imaging results that were available during my care of the patient were reviewed by me  and considered in my medical decision making.  Assessment & Plan:  Gaylene was seen today for hospitalization follow-up.  Diagnoses and all orders for this visit:  Primary hypertension -     amLODipine  (NORVASC ) 2.5 MG tablet; Take 1 tablet (2.5 mg total) by mouth daily.  Vasovagal syncope -     BMP8+EGFR -     Anemia Profile B  Nondisplaced fracture of medial malleolus of right tibia, initial encounter for closed fracture  Spleen laceration, sequela -     Anemia Profile B  Atrial fibrillation, unspecified type (HCC)  Thyroid  nodule -     Thyroid  Panel With TSH  Abnormal TSH -     Thyroid  Panel With TSH  Low serum vitamin K -     BMP8+EGFR  H/O dizziness -     BMP8+EGFR  Low hemoglobin and low hematocrit -     Anemia Profile B    Kemia is a 89 year old Hispanic female seen today for hospital discharge follow-up, no acute distress Assessment and Plan Assessment & Plan Right ankle fracture Fracture managed with cast since January 10, 2024. - Continue with cast care. - Attend follow-up appointment on Thursday.  Dizziness and decreased appetite Dizziness likely due to medication side effects, anemia, and dehydration. Decreased appetite affecting nutrition. - Decrease amlodipine  dose 2.5 mg daily - Encourage increased water intake. - Recommend nutritional supplements like Ensure or Boost. - Monitor dietary intake.  Essential hypertension Hypertension managed with amlodipine . Current dose causing dizziness, normal blood pressure readings. -  Decrease amlodipine  to 2.5 mg daily. - Increase water intake to prevent dehydration. - Monitor blood pressure and symptoms.  Atrial fibrillation and heart murmur Atrial fibrillation with cardiology follow-up scheduled. Long-term cardiac monitor fell off, heart murmur noted. - Mail back the cardiac monitor for assessment.  Iron deficiency anemia Iron deficiency anemia with low hemoglobin and hematocrit, slight improvement noted. - Order anemia panel to assess iron levels.  Laceration of spleen Spleen laceration identified, potential contributor to anemia. - Monitor for signs of anemia. - Include spleen assessment in anemia panel.  Hypokalemia and hypocalcemia Low potassium and calcium  levels noted, essential for muscle function. - Order BMP to reassess potassium and calcium  levels. - Consider supplementation if levels remain low.  Thyroid  function abnormality Thyroid  function abnormality with low marker noted, potential contributor to dizziness. - Order repeat thyroid  function tests to confirm abnormality.    Labs: CMP, Anemia panel, TSH results pending  Continue all other maintenance medications.  Follow up plan: Return for with PCP as already schedule.   Continue healthy lifestyle choices, including diet (rich in fruits, vegetables, and lean proteins, and low in salt and simple carbohydrates) and exercise (at least 30 minutes of moderate physical activity daily).  Educational handout given for    Clinical References  Anemia Anemia  La anemia es una afeccin en la que no hay una cantidad suficiente de glbulos rojos o Education officer, environmental. La hemoglobina es la sustancia de los glbulos rojos que transporta el oxgeno. Cuando no hay suficientes glbulos rojos o hemoglobina (se est anmico), el cuerpo no puede recibir el oxgeno suficiente, y es posible que los rganos no funcionen correctamente. Como resultado, es posible que se sienta muy cansado o sufra otros  problemas. Cules son las causas? Las causas ms frecuentes de anemia son: Sangrado excesivo. La anemia puede ser causada por un sangrado excesivo dentro o fuera del cuerpo, incluido sangrado de los intestinos o a causa de perodos menstruales abundantes en  las mujeres. Nutricin deficiente. Enfermedad heptica, tiroidea o renal prolongada (crnica). Trastornos de la mdula sea, problemas en el bazo y trastornos de la sangre. Cncer y tratamientos para Management consultant. Virus de inmunodeficiencia humana (VIH) y sndrome de inmunodeficiencia adquirida (SIDA). Infecciones, medicamentos y enfermedades autoinmunes que [employer] glbulos rojos. Cules son los signos o sntomas? Los sntomas de esta afeccin incluyen: Debilidad leve. Mareos. Dolor de [place] o dificultad para concentrarse y dormir. Latidos cardacos irregulares o ms rpidos que lo normal (palpitaciones). Falta de aire, especialmente con el ejercicio. Piel, labios y uas plidos, o manos y pies fros. Malestar estomacal (indigestin) y nuseas. Los sntomas pueden ocurrir repentinamente o Audiological scientist. Si la anemia es leve, es posible que no tenga sntomas. Cmo se diagnostica? Esta afeccin se diagnostica en funcin de anlisis de sangre, los antecedentes mdicos y un examen fsico. En algunos casos, se puede necesitar una prueba en la que se extraen clulas del tejido blando que est dentro de un hueso y se las observa con un microscopio (biopsia de mdula sea). Adems, el mdico puede controlar si hay sangre en sus heces (materia fecal) y realizar ms anlisis para Landscape architect causa del sangrado. Otras pruebas que pueden realizarle son las siguientes: Pruebas de diagnstico por imgenes, como una resonancia magntica (RM) o una exploracin por tomografa computarizada (TC). Un procedimiento para examinar el interior del esfago y Investment banker, corporate (endoscopa). El esfago es el rgano del cuerpo que transporta los  alimentos desde la boca al [place]. Un procedimiento para examinar el interior del colon y el recto (colonoscopa). Cmo se trata? El tratamiento de esta afeccin depende de la causa. Si contina perdiendo [employer], es posible que necesite recibir tratamiento en un hospital. El tratamiento puede incluir: Tomar suplementos de hierro, vitamina B12 o cido flico. Tomar un medicamento para las hormonas (eritropoyetina) que puede ayudar a Radio producer de glbulos rojos. Recibir sangre de un donante a travs de una va intravenosa (transfusin de [place]). Esta ser necesaria si pierde [employer]. Realizar cambios en la dieta. Someterse a [place] para extirpar el bazo. Siga estas indicaciones en su casa: Use los medicamentos de venta libre y los recetados solamente como se lo haya indicado el mdico. Tome los suplementos solamente como se lo haya indicado el mdico. Siga las instrucciones del mdico en lo que respecta a la dieta. Concurra a todas las visitas de seguimiento. El mdico posiblemente le pida que repita los anlisis de Alexis. Comunquese con un mdico si: Tiene nuevos sangrados en cualquier parte del cuerpo. Se siente muy dbil. Solicite ayuda de inmediato si: Le falta el aire. Siente dolor en la espalda, el abdomen o el pecho. Se siente mareado o sufre un desmayo. Tiene dificultad para concentrarse. Sus heces tienen [place], son judge o son alquitranadas. Vomita repetidamente o vomita sangre. Estos sntomas pueden Customer service manager. Solicite ayuda de inmediato. Llame al 911. No espere a ver si los sntomas desaparecen. No conduzca por sus propios medios [facility]. Resumen La anemia es una afeccin en la que no hay suficientes glbulos rojos o la cantidad suficiente de la sustancia de los glbulos rojos que transporta el oxgeno. Los sntomas pueden ocurrir repentinamente o Audiological scientist. Si la anemia es leve, es posible que no tenga  sntomas. Esta afeccin se diagnostica mediante anlisis de sangre, los antecedentes mdicos y un examen fsico. Pueden ser necesarios otros estudios. El tratamiento de esta afeccin depende de la causa de la anemia. Esta informacin no tiene  como fin reemplazar el consejo del mdico. Asegrese de hacerle al mdico cualquier pregunta que tenga. Document Revised: 07/31/2021 Document Reviewed: 07/31/2021 Elsevier Patient Education  2024 Elsevier Inc. Hipertensin en los adultos Hypertension, Adult El trmino hipertensin es otra forma de denominar a la presin arterial elevada. La presin arterial elevada fuerza al corazn a trabajar ms para bombear la sangre. Esto puede causar problemas con el paso del Agency. Una lectura de presin arterial est compuesta por 2 nmeros. Hay un nmero superior (sistlico) sobre un nmero inferior (diastlico). Lo ideal es tener la presin arterial por debajo de 120/80. Cules son las causas? Se desconoce la causa de esta afeccin. Algunas otras afecciones pueden provocar presin arterial elevada. Qu incrementa el riesgo? Algunos factores del estilo de vida pueden hacer que tenga ms probabilidades de desarrollar presin arterial elevada: Fumar. No hacer la cantidad suficiente de actividad fsica o ejercicio. Tener sobrepeso. Consumir mucha grasa, azcar, caloras o sal (sodio) en su dieta. Beber alcohol en exceso. Otros factores de riesgo son los siguientes: Tener alguna de estas afecciones: Enfermedad cardaca. Diabetes. Colesterol alto. Enfermedad renal. Apnea obstructiva del sueo. Tener antecedentes familiares de presin arterial elevada y colesterol elevado. Edad. El riesgo aumenta con la edad. Estrs. Cules son los signos o sntomas? Es posible que la presin arterial alta no cause sntomas. La presin arterial muy alta (crisis hipertensiva) puede provocar: Dolor de cabeza. Latidos cardacos acelerados o irregulares (palpitaciones). Falta  de aire. Hemorragia nasal. Vomitar o sentir ganas de vomitar (nuseas). Cambios en la forma de ver. Dolor muy intenso en el pecho. Sensacin de mareo. Convulsiones. Cmo se trata? Esta afeccin se trata haciendo cambios saludables en el estilo de vida, por ejemplo: Consumir alimentos saludables. Hacer ms ejercicio. Beber menos alcohol. El mdico puede recetarle medicamentos si los cambios en el estilo de vida no son lo suficientemente eficaces y si: El nmero de arriba est por encima de 130. El nmero de abajo est por encima de 80. Su presin arterial personal ideal puede variar. Siga estas indicaciones en su casa: Comida y bebida  Si se lo dicen, siga el plan de alimentacin de DASH (Dietary Approaches to Stop Hypertension, Maneras de alimentarse para detener la hipertensin). Para seguir este plan: Llene la mitad del plato de cada comida con frutas y verduras. Llene un cuarto del plato de cada comida con cereales integrales. Los cereales integrales incluyen pasta integral, arroz integral y pan integral. Coma y beba productos lcteos con bajo contenido de grasa, como leche descremada o yogur bajo en grasas. Llene un cuarto del plato de cada comida con protenas bajas en grasa (magras). Las protenas bajas en grasa incluyen pescado, pollo sin piel, huevos, frijoles y tofu. Evite consumir carne grasa, carne curada y procesada, o pollo con piel. Evite consumir alimentos prehechos o procesados. Limite la cantidad de sal en su dieta a menos de 1500 mg por da. No beba alcohol si: El mdico le indica que no lo haga. Est embarazada, puede estar embarazada o est tratando de quedar embarazada. Si bebe alcohol: Limite la cantidad que bebe a lo siguiente: De 0 a 1 medida por da para las mujeres. De 0 a 2 medidas por da para los hombres. Sepa cunta cantidad de alcohol hay en las bebidas que toma. En los [address], una medida equivale a una botella de cerveza de 12 oz (355 ml), un  vaso de vino de 5 oz (148 ml) o un vaso de una bebida alcohlica de alta graduacin de 1 oz (  44 ml). Estilo de vida  Trabaje con su mdico para mantenerse en un peso saludable o para perder peso. Pregntele a su mdico cul es el peso recomendable para usted. Realice al menos 30 minutos de ejercicio que haga que se acelere su corazn (ejercicio Magazine features editor) la [employer] de la [place]. Estos pueden incluir caminar, nadar o andar en bicicleta. Realice al menos 30 minutos de ejercicio que fortalezca sus msculos (ejercicios de resistencia) al menos 3 das a la [place]. Estos pueden incluir levantar pesas o hacer [place]. No fume ni consuma ningn producto que contenga nicotina o tabaco. Si necesita ayuda para dejar de consumir estos productos, consulte al mdico. Controle su presin arterial en su casa tal como le indic el mdico. Concurra a todas las visitas de seguimiento. Medicamentos Use los medicamentos de venta libre y los recetados solamente como se lo haya indicado el mdico. Siga cuidadosamente las indicaciones. No omita las dosis de medicamentos para la presin arterial. Los medicamentos pierden eficacia si omite dosis. El hecho de omitir las dosis tambin lesotho el riesgo de otros problemas. Pregntele a su mdico a qu efectos secundarios o reacciones a los medicamentos debe prestar atencin. Comunquese con un mdico si: Piensa que tiene burkina faso reaccin a los medicamentos que est tomando. Tiene dolores de cabeza frecuentes. Siente mareos. Tiene hinchazn en los tobillos. Tiene problemas de visin. Solicite ayuda de inmediato si: Siente un dolor de cabeza muy intenso. Empieza a sentirse desorientado (confundido). Se siente dbil o adormecido. Siente que va a desmayarse. Tiene un dolor muy intenso en: Pecho. Vientre (abdomen). Vomita ms de una vez. Tiene dificultad para respirar. Estos sntomas pueden Customer service manager. Solicite ayuda de inmediato. Llame al 911. No  espere a ver si los sntomas desaparecen. No conduzca por sus propios medios [employer]. Resumen El trmino hipertensin es otra forma de denominar a la presin arterial elevada. La presin arterial elevada fuerza al corazn a trabajar ms para bombear la sangre. Para la [employer], una presin arterial normal es menor que 120/80. Las decisiones saludables pueden ayudarle a disminuir su presin arterial. Si no puede bajar su presin arterial mediante decisiones saludables, es posible que deba tomar medicamentos. Esta informacin no tiene Theme park manager el consejo del mdico. Asegrese de hacerle al mdico cualquier pregunta que tenga. Document Revised: 02/14/2021 Document Reviewed: 02/14/2021 Elsevier Patient Education  2024 Elsevier Inc. Cmo tomarse la presin arterial How to Take Your Blood Pressure La presin arterial es la medida de la fuerza de la sangre al presionar contra las paredes de las arterias. Las arterias son los vasos sanguneos que transportan la sangre desde el corazn hacia todas las partes del cuerpo. Usted puede tomar su presin arterial en casa con un aparato. Es posible que tenga que tomar su presin arterial en casa: Para ver si tiene presin arterial elevada (hipertensin). Para controlar su presin arterial a lo largo del tiempo. Para asegurarse de que el medicamento para la presin arterial est surtiendo Engineer, mining. Materiales necesarios: Aparato de medicin de la presin arterial o tensimetro. Una silla para sentarse. Debe ser ignacia silla en la que pueda sentarse erguido con la espalda apoyada. No se siente en un silln blando o sof. Mesa o escritorio. Cuaderno pequeo. Lpiz o bolgrafo. Cmo prepararse Evite realizar lo siguiente durante los 30 minutos anteriores a Chief Operating Officer su presin arterial: Consumir bebidas con cafena, como caf o t. Consumir alcohol. [place]. Fumar. Realizar actividad fsica. Haga lo siguiente cinco minutos  antes  de controlarse la presin arterial: Vaya al bao y haga pis (orine). Sintese en una silla. Est tranquilo. No hable. Cmo tomarse la presin arterial Siga las instrucciones que vienen con el aparato. Si tiene Ambulance person, las instrucciones podran ser las siguientes: Sintese con la espalda recta. Coloque los pies en el piso. No cruce los tobillos ni las piernas. Apoye el brazo izquierdo al nivel del corazn. Puede apoyarlo en una mesa, escritorio o silla. Arremnguese. Envuelva la parte superior de su brazo izquierdo con el brazalete para la presin arterial. El brazalete debe estar a 1 pulgada (2.5 cm) sobre su codo. Es mejor Optometrist brazalete alrededor de la piel [place]. Ajuste el brazalete ceidamente alrededor del brazo, pero no demasiado apretado. Debe poder meter nicamente un dedo entre el brazalete y el brazo. Coloque el cordn de modo que quede apoyado en el pliegue del codo. Presione el botn de encendido. Qudese sentado tranquilamente mientras el brazalete se infla y se desinfla. Escriba los nmeros que se muestran en la pantalla. Espere 2 o 3 minutos y repita los pasos 1 al 10. Qu significan los nmeros? Dos nmeros conforman la presin arterial. El primer nmero es la presin sistlica. El segundo nmero es la presin diastlica. Un ejemplo de lectura de presin arterial sera 120 sobre 80 (o 120/80). Si es adulto y no tiene Therapist, music, use esta gua para saber si su presin arterial es normal: Normal Primer nmero: debajo de 120. Segundo nmero: debajo de 80. Elevada Primer nmero: 120-129. Segundo nmero: debajo de 80. Etapa 1 de hipertensin Primer nmero: 130-139. Segundo nmero: 80-89. Etapa 2 de hipertensin Primer nmero: 140 o ms. Segundo nmero: 90 o ms. Su presin arterial se encuentra por encima del nivel normal incluso si solo el primero o solo el segundo nmero est por encima de lo normal. Siga estas indicaciones en su  casa: Medicamentos Use los medicamentos de venta libre y los recetados solamente como se lo haya indicado el mdico. Dgale al mdico si los medicamentos que toma le causan efectos secundarios. Indicaciones generales Controle su presin arterial con la frecuencia que le indique su mdico. Contrlese la presin arterial a la misma hora todos los [place]. Lleve el tensimetro a su prxima cita con el mdico. Su mdico: Se asegurar de que lo est usando correctamente. Se asegurar de que funcione bien. Entienda cules deben ser sus nmeros de presin arterial. Concurra a todas las visitas de seguimiento. Consejos generales Necesitar un aparato de medicin de la presin arterial o tensimetro. Su mdico puede sugerirle un tensimetro. Puede comprar uno en una farmacia o en lnea. Al escoger uno: Escoja uno que tenga un brazalete. Escoja uno que se envuelva en la parte superior de su brazo. Debe poder meter nicamente un dedo entre el brazalete y el brazo. No escoja uno que mida su presin arterial en la mueca o el dedo. Dnde obtener ms informacin American Heart Association (Asociacin Estadounidense del Corazn): www.heart.org Comunquese con un mdico si: Su presin arterial sigue alta. Su presin arterial disminuye repentinamente. Solicite ayuda de inmediato si: Su primer nmero de presin arterial es ms alto que 180. Su segundo nmero de presin arterial es ms alto que 120. Estos sntomas pueden Customer service manager. No espere a ver si los sntomas desaparecen. Solicite ayuda de inmediato. Llame al 911. Resumen Contrlese la presin arterial a la [employer]. Evite la cafena, el alcohol, fumar y Radio producer actividad fsica durante los 30 minutos anteriores a controlarse la presin  arterial. Se asegurar de que entienda cules deben ser sus nmeros de presin arterial. Esta informacin no tiene como fin reemplazar el consejo del mdico. Asegrese de hacerle al mdico  cualquier pregunta que tenga. Document Revised: 01/14/2021 Document Reviewed: 01/14/2021 Elsevier Patient Education  2024 Elsevier Inc.  The above assessment and management plan was discussed with the patient. The patient verbalized understanding of and has agreed to the management plan. Patient is aware to call the clinic if they develop any new symptoms or if symptoms persist or worsen. Patient is aware when to return to the clinic for a follow-up visit. Patient educated on when it is appropriate to go to the emergency department.  Ilda Laskin [place] Thompson, DNP [facility] [address] [place], [place] [address] [phone]

## 2024-01-27 ENCOUNTER — Ambulatory Visit: Payer: Self-pay | Admitting: Nurse Practitioner

## 2024-01-27 DIAGNOSIS — D508 Other iron deficiency anemias: Secondary | ICD-10-CM | POA: Insufficient documentation

## 2024-01-27 DIAGNOSIS — E041 Nontoxic single thyroid nodule: Secondary | ICD-10-CM

## 2024-01-27 DIAGNOSIS — R7989 Other specified abnormal findings of blood chemistry: Secondary | ICD-10-CM

## 2024-01-27 LAB — BMP8+EGFR
BUN/Creatinine Ratio: 12 (ref 12–28)
BUN: 10 mg/dL (ref 8–27)
CO2: 22 mmol/L (ref 20–29)
Calcium: 9 mg/dL (ref 8.7–10.3)
Chloride: 101 mmol/L (ref 96–106)
Creatinine, Ser: 0.81 mg/dL (ref 0.57–1.00)
Glucose: 94 mg/dL (ref 70–99)
Potassium: 4.7 mmol/L (ref 3.5–5.2)
Sodium: 137 mmol/L (ref 134–144)
eGFR: 70 mL/min/1.73 (ref >59)

## 2024-01-27 LAB — ANEMIA PROFILE B
Basophils Absolute: 0 x10E3/uL (ref 0.0–0.2)
Basos: 0 %
EOS (ABSOLUTE): 0.3 x10E3/uL (ref 0.0–0.4)
Eos: 4 %
Ferritin: 147 ng/mL (ref 15–150)
Folate: 2.9 ng/mL — AB (ref >3.0)
Hematocrit: 36.9 % (ref 34.0–46.6)
Hemoglobin: 11.5 g/dL (ref 11.1–15.9)
Immature Grans (Abs): 0.1 x10E3/uL (ref 0.0–0.1)
Immature Granulocytes: 1 %
Iron Saturation: 14 % — AB (ref 15–55)
Iron: 32 ug/dL (ref 27–139)
Lymphocytes Absolute: 1.1 x10E3/uL (ref 0.7–3.1)
Lymphs: 14 %
MCH: 27.5 pg (ref 26.6–33.0)
MCHC: 31.2 g/dL — ABNORMAL LOW (ref 31.5–35.7)
MCV: 88 fL (ref 79–97)
Monocytes Absolute: 0.5 x10E3/uL (ref 0.1–0.9)
Monocytes: 6 %
Neutrophils Absolute: 6.1 x10E3/uL (ref 1.4–7.0)
Neutrophils: 75 %
Platelets: 514 x10E3/uL — ABNORMAL HIGH (ref 150–450)
RBC: 4.18 x10E6/uL (ref 3.77–5.28)
RDW: 14.3 % (ref 11.7–15.4)
Retic Ct Pct: 1.7 % (ref 0.6–2.6)
Total Iron Binding Capacity: 232 ug/dL — AB (ref 250–450)
UIBC: 200 ug/dL (ref 118–369)
Vitamin B-12: 426 pg/mL (ref 232–1245)
WBC: 8.1 x10E3/uL (ref 3.4–10.8)

## 2024-01-27 LAB — THYROID PANEL WITH TSH
Free Thyroxine Index: >3.8 (ref 1.2–4.9)
T3 Uptake Ratio: >56 % — AB (ref 24–39)
T4, Total: 6.8 ug/dL (ref 4.5–12.0)
TSH: 5.06 u[IU]/mL — AB (ref 0.450–4.500)

## 2024-01-27 MED ORDER — FERROUS SULFATE 325 (65 FE) MG PO TABS: 325.0000 mg | ORAL_TABLET | Freq: Every day | ORAL | 1 refills | Status: AC

## 2024-01-27 NOTE — Telephone Encounter (Signed)
 Son aware of results. [facility] called this morning but no appointment until January. Wants to know if okay to wait until then.

## 2024-01-28 ENCOUNTER — Telehealth: Payer: Self-pay

## 2024-01-28 DIAGNOSIS — S82841D Displaced bimalleolar fracture of right lower leg, subsequent encounter for closed fracture with routine healing: Secondary | ICD-10-CM | POA: Diagnosis not present

## 2024-01-28 NOTE — Telephone Encounter (Signed)
 Copied from CRM #[id number]. Topic: Appointments - Appointment Info/Confirmation >> Jan 28, 2024 11:29 AM Antwanette L wrote: Cherlyn a physical therapist  from [facility] called regarding the patient's missed appointment. The patient was unable to attend due to a conflicting doctor's appointment. Lorie can be contacted [phone]

## 2024-01-28 NOTE — Telephone Encounter (Signed)
 Called and spoke with Mallory and gave her verbal okay for OT order.  Copied from CRM [id number]. Topic: Clinical - Home Health Verbal Orders >> Jan 28, 2024 10:43 AM Joesph NOVAK wrote: [facility]  Callback Number: [phone] Service Requested: Occupational Therapy Frequency: ONCE A WEEK FOR 8 WEEKS Any new concerns about the patient? No

## 2024-01-28 NOTE — Telephone Encounter (Signed)
Left detailed message for son

## 2024-01-28 NOTE — Telephone Encounter (Signed)
 noted

## 2024-01-29 ENCOUNTER — Telehealth: Payer: Self-pay | Admitting: Family Medicine

## 2024-01-29 ENCOUNTER — Telehealth: Payer: Self-pay

## 2024-01-29 NOTE — Telephone Encounter (Signed)
 Daughter wants to know if office has received ppw from [employer] for incontinence supplies. Please call back

## 2024-01-29 NOTE — Telephone Encounter (Signed)
 Copied from CRM [id number]. Topic: Clinical - Home Health Verbal Orders >> Jan 29, 2024 12:57 PM Nathanel BROCKS wrote: Physical therapy was not done today due to [facility] not covering that area today. Will advise next week.

## 2024-01-29 NOTE — Telephone Encounter (Signed)
 Noted

## 2024-02-01 ENCOUNTER — Ambulatory Visit: Payer: Self-pay

## 2024-02-01 NOTE — Telephone Encounter (Signed)
 FYI Only or Action Required?: Action required by provider: [employer] paperwork.  Patient was last seen in primary care on 01/26/2024 by Deitra Morton Sebastian Nena, NP.  Called Nurse Triage reporting Rectal Bleeding.  Symptoms began several days ago.  Interventions attempted: Rest, hydration, or home remedies.  Symptoms are: gradually worsening.  Triage Disposition: Go to ED Now (Notify PCP)  Patient/caregiver understands and will follow disposition?: Yes  Copied from CRM [id number]. Topic: Clinical - Red Word Triage >> Feb 01, 2024  4:01 PM Delon HERO wrote: Red Word that prompted transfer to Nurse Triage: Patient's son Christell is calling to report that the patient had blood in her stool last Wednesday & Yesterday. Patient is not reporting any abdominal pain. And no other symptoms. Reason for Disposition  [1] MODERATE rectal bleeding (e.g., small blood clots, passing blood without stool, or toilet water turns red) AND [2] more than once a day  Answer Assessment - Initial Assessment Questions Pt son calls and states Wednesday pt had one episode of bright red blood in bm. And today had two episodes of the same. Pt denies abd pain, admits to nausea. Pt recently dc'd from hospital with foot fx and spleen lac. Recently constipated was taking stool softeners.  Denies dizziness/sob. RN recommended ED at this time for symptom evaluation. Pt son also requests doctor to f/u on paperwork for [employer] incontinence supplies.   1. APPEARANCE of BLOOD: What color is it? Is it passed separately, on the surface of the stool, or mixed in with the stool?      Bright red 2. AMOUNT: How much blood was passed?      Husband states it was a lot of blood.  3. FREQUENCY: How many times has blood been passed with the stools?      Once Wednesday, twice yesterday.  4. ONSET: When was the blood first seen in the stools? (Days or weeks)      Wednesday  5. DIARRHEA: Is there also some diarrhea? If Yes, ask:  How many diarrhea stools in the past 24 hours?      denies 6. CONSTIPATION: Do you have constipation? If Yes, ask: How bad is it?     denies 7. RECURRENT SYMPTOMS: Have you had blood in your stools before? If Yes, ask: When was the last time? and What happened that time?      denies 8. BLOOD THINNERS: Do you take any blood thinners? (e.g., aspirin, clopidogrel / Plavix, coumadin, heparin). Notes: Other strong blood thinners include: Arixtra (fondaparinux), Eliquis (apixaban), Pradaxa (dabigatran), and Xarelto (rivaroxaban).     denies 9. OTHER SYMPTOMS: Do you have any other symptoms?  (e.g., abdomen pain, vomiting, dizziness, fever)     Denies  Protocols used: Rectal Bleeding-A-AH

## 2024-02-02 ENCOUNTER — Other Ambulatory Visit: Payer: Self-pay | Admitting: Family Medicine

## 2024-02-02 ENCOUNTER — Telehealth: Payer: Self-pay

## 2024-02-02 NOTE — Telephone Encounter (Signed)
 Form was received, [employer] is requiring documentation w/n the last 6 mos for supplies. There is no documentation in the most recent OV notes. Appt made for 02/12/24.

## 2024-02-02 NOTE — Telephone Encounter (Signed)
 FYI  Copied from CRM [id number]. Topic: Clinical - Order For Equipment >> Feb 02, 2024  2:30 PM Harlene ORN wrote: Reason for CRM: Jannis - [employer]  faxed over an office note request for incontinence supplies. Just needs the office notes to confirm that the patient needs incontinence supplies.  Phone: [phone] Fax: [phone]

## 2024-02-03 DIAGNOSIS — I480 Paroxysmal atrial fibrillation: Secondary | ICD-10-CM | POA: Diagnosis not present

## 2024-02-03 NOTE — Addendum Note (Signed)
 Encounter addended by: Malvina Pina A on: 02/03/2024 1:23 PM  Actions taken: Imaging Exam ended

## 2024-02-05 ENCOUNTER — Ambulatory Visit: Payer: Self-pay | Admitting: Physician Assistant

## 2024-02-08 NOTE — Progress Notes (Unsigned)
 Cardiology Office Note    Date:  02/09/2024  ID:  [name], DOB 01/22/1933, MRN [id number] PCP:  Dettinger, Fonda LABOR, MD  Cardiologist:  Jerel Balding, MD  Electrophysiologist:  None   Chief Complaint: f/u atrial fibrillation  History of Present Illness: .    [name] is a 89 y.o. female with visit-pertinent history of HTN, heart murmur, arthritis, UTI, LVH, atrial fibrillation, mild-moderate aortic stenosis, mild mitral regurgitation, coronary/aortic atherosclerosis, cerebrovascular disease, incidental thyroid  nodule on imaging seen for follow-up. [name] remotely saw Dr. Lavona in 2016 for dyspnea and palpitations. Monitor was not completed. 2D echo showed EF 60-65% with moderate LVH. She was admitted 12/2023 with fall striking her head, unclear if syncope (patient did not think she did, family could not be sure). Presenting BP 109/65. Trauma workup showed splenic laceration and right medial malleolus fracture. She was also noted to be in newly recognized rate controlled AF. 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 Echo showed EF 55-60%, mildly reduced RV function, mild MR, findings most c/w mild-moderate AS, suspected bicuspid AV (previously reported tricuspid in 2016). She has no acute complaints today. Anticoagulation deferred due to splenic lac. Monitor showed 100% AF, rare PVCs, no bradycardia, avg 81bpm, range 45-158bpm but mostly good ventricular rate control.  She is seen back with husband and son [name] who helps translate per her request. In general she feels she is doing well without any CP, SOB, palpitations, edema, abdominal pain, nausea, vomiting or concerns. Family does note two concerns: - continued episodic dizziness going on for several months even before recent admission. This is now  short lived 1-2x/week lasting only a few seconds, resolving spontaneously, sometimes with transfers - happened while wearing monitor as well. The last fall before recent one was 6 months prior - episode of bright blood in stool 10/12 and 10/13 while significantly constipated s/p straining, no frank GI losses or melena, did not seek care, symptoms resolved and has since had normal BM   Labwork independently reviewed: 01/2024 BMET wnl, TSH 5.060 managed by PCP 12/2023 Mg OK, TSH 4.9, AST ALT ok, Hgb 9-11's 01/2023 LDL 91, trig 107 (PCP)  ROS: .    Please see the history of present illness.  All other systems are reviewed and otherwise negative.  Studies Reviewed: [name]    EKG:  EKG is ordered today, personally reviewed, demonstrating:  EKG Interpretation Date/Time:  Tuesday February 09 2024 14:44:52 EDT Ventricular Rate:  66 PR Interval:    QRS Duration:  100 QT Interval:  402 QTC Calculation: 421 R Axis:   25  Text Interpretation: Atrial fibrillation Septal infarct , age undetermined Nonspecific T wave abnormality Confirmed by Janita Camberos [phone]) on 02/09/2024 3:15:02 PM   CV Studies: Cardiac studies reviewed are outlined and summarized above. Otherwise please see EMR for full report.   Current Reported Medications:.    Current Meds  Medication Sig   amLODipine  (NORVASC ) 2.5 MG tablet Take 1 tablet (2.5 mg total) by mouth daily.   atorvastatin  (LIPITOR) 80 MG tablet Take 1 tablet (80 mg total) by mouth daily.   ferrous sulfate 325 (65 FE) MG tablet Take 1 tablet (325 mg total) by mouth daily with breakfast.   HYDROcodone -acetaminophen  (NORCO/VICODIN) 5-325 MG tablet Take 1-2 tablets by mouth every 6 (six) hours as needed for moderate pain (pain score  4-6) or severe pain (pain score 7-10).   loratadine  (CLARITIN ) 10 MG tablet Take 1 tablet (10 mg total) by mouth daily. (Patient taking differently: Take 10 mg by mouth as needed. Seasonal allergies)   mirtazapine  (REMERON ) 7.5 MG  tablet Take 1 tablet (7.5 mg total) by mouth at bedtime.   MYRBETRIQ  25 MG TB24 tablet Take 1 tablet (25 mg total) by mouth daily.   Current Facility-Administered Medications for the 02/09/24 encounter (Office Visit) with Abigail Raphael SAILOR, PA-C  Medication   [START ON 02/28/2024] denosumab  (PROLIA ) injection 60 mg    Physical Exam:    VS:  BP 110/80   Pulse 66   Ht 4' 10 (1.473 m)   Wt 118 lb (53.5 kg)   SpO2 97%   BMI 24.66 kg/m    Wt Readings from Last 3 Encounters:  02/09/24 118 lb (53.5 kg)  01/26/24 118 lb (53.5 kg)  01/10/24 118 lb 13.3 oz (53.9 kg)    GEN: Well nourished, well developed in no acute distress NECK: No JVD; No carotid bruits CARDIAC: irregularly irregular, soft murmur, no murmurs, rubs, gallops RESPIRATORY:  Clear to auscultation without rales, wheezing or rhonchi  ABDOMEN: Soft, non-tender, non-distended EXTREMITIES:  No edema; No acute deformity   Asessement and Plan:.    1. Persistent atrial fibrillation - discussed with Dr. Francyne prior to OV. At this point, he feels it has been a reasonable amount of time to allow for splenic lac to heal to introduce anticoagulation with Eliquis. She would qualify for low dose at 2.5mg  BID. The patient does report an episode of BRBPR on 2 stools (10/12, 10/13) while straining but none since, possibly related to constipation. She also has had issues with chronic intermittent dizziness this past year. Thankfully this is very rare and infrequent, but also presents consideration when discussing anticoagulation. I will check a CBC today to ensure stable and reach out to Dr. Francyne with this update once we have those results to help guide final recommendation. Will stop amlodipine  to allow more judicious blood pressure given her advanced age. There were no significant pauses or dangerous arrhythmias while wearing the monitor.   2. Mild-moderate AS, mild MR, mild dilation of ascending aorta - no acute intervention needed at this  time. Consider repeat echo 12/2024 (primarily for trending of AS) or as clinically indicated.  3. Fall, history of dizziness, superimposed on history of HTN, cerebrovascular disease also with question of FMD - see plan above. Will also refer to neurology given dizziness and cerebrovascular disease for completeness.    Disposition: F/u with me or Dr. Francyne in 8 weeks.  Signed, Perley Arthurs N Larayah Clute, PA-C

## 2024-02-09 ENCOUNTER — Ambulatory Visit: Attending: Physician Assistant | Admitting: Physician Assistant

## 2024-02-09 ENCOUNTER — Encounter: Payer: Self-pay | Admitting: Physician Assistant

## 2024-02-09 ENCOUNTER — Telehealth: Payer: Self-pay

## 2024-02-09 VITALS — BP 110/80 | HR 66 | Ht <= 58 in | Wt 118.0 lb

## 2024-02-09 DIAGNOSIS — M810 Age-related osteoporosis without current pathological fracture: Secondary | ICD-10-CM

## 2024-02-09 DIAGNOSIS — I10 Essential (primary) hypertension: Secondary | ICD-10-CM | POA: Diagnosis not present

## 2024-02-09 DIAGNOSIS — R42 Dizziness and giddiness: Secondary | ICD-10-CM | POA: Diagnosis not present

## 2024-02-09 DIAGNOSIS — I4819 Other persistent atrial fibrillation: Secondary | ICD-10-CM

## 2024-02-09 DIAGNOSIS — I340 Nonrheumatic mitral (valve) insufficiency: Secondary | ICD-10-CM | POA: Diagnosis not present

## 2024-02-09 DIAGNOSIS — W19XXXS Unspecified fall, sequela: Secondary | ICD-10-CM | POA: Diagnosis not present

## 2024-02-09 DIAGNOSIS — I350 Nonrheumatic aortic (valve) stenosis: Secondary | ICD-10-CM

## 2024-02-09 LAB — CBC
Hematocrit: 38.7 % (ref 34.0–46.6)
Hemoglobin: 12.3 g/dL (ref 11.1–15.9)
MCH: 27.6 pg (ref 26.6–33.0)
MCHC: 31.8 g/dL (ref 31.5–35.7)
MCV: 87 fL (ref 79–97)
Platelets: 278 x10E3/uL (ref 150–450)
RBC: 4.46 x10E6/uL (ref 3.77–5.28)
RDW: 14.9 % (ref 11.7–15.4)
WBC: 8.2 x10E3/uL (ref 3.4–10.8)

## 2024-02-09 MED ORDER — DENOSUMAB 60 MG/ML ~~LOC~~ SOSY
60.0000 mg | PREFILLED_SYRINGE | SUBCUTANEOUS | Status: AC
  Administered 2024-03-04: 60 mg via SUBCUTANEOUS

## 2024-02-09 NOTE — Telephone Encounter (Signed)
 Prolia  sent for benefit verification

## 2024-02-09 NOTE — Patient Instructions (Signed)
 Medication Instructions:  Stop Amlodipine  *If you need a refill on your cardiac medications before your next appointment, please call your pharmacy*  Lab Work: Today-CBC If you have labs (blood work) drawn today and your tests are completely normal, you will receive your results only by: MyChart Message (if you have MyChart) OR A paper copy in the mail If you have any lab test that is abnormal or we need to change your treatment, we will call you to review the results.   Follow-Up: At [facility], you and your health needs are our priority.  As part of our continuing mission to provide you with exceptional heart care, our providers are all part of one team.  This team includes your primary Cardiologist (physician) and Advanced Practice Providers or APPs (Physician Assistants and Nurse Practitioners) who all work together to provide you with the care you need, when you need it.  Your next appointment:   6 week(s)  Provider:   Jerel Balding, MD or Dayna Dunn, PA-C          We recommend signing up for the patient portal called MyChart.  Sign up information is provided on this After Visit Summary.  MyChart is used to connect with patients for Virtual Visits (Telemedicine).  Patients are able to view lab/test results, encounter notes, upcoming appointments, etc.  Non-urgent messages can be sent to your provider as well.   To learn more about what you can do with MyChart, go to [email or web address].   Other Instructions Referral sent to Neurology

## 2024-02-10 ENCOUNTER — Ambulatory Visit

## 2024-02-10 ENCOUNTER — Ambulatory Visit: Payer: Self-pay | Admitting: Physician Assistant

## 2024-02-10 DIAGNOSIS — K219 Gastro-esophageal reflux disease without esophagitis: Secondary | ICD-10-CM | POA: Diagnosis not present

## 2024-02-10 DIAGNOSIS — S36039D Unspecified laceration of spleen, subsequent encounter: Secondary | ICD-10-CM | POA: Diagnosis not present

## 2024-02-10 DIAGNOSIS — E041 Nontoxic single thyroid nodule: Secondary | ICD-10-CM | POA: Diagnosis not present

## 2024-02-10 DIAGNOSIS — M19071 Primary osteoarthritis, right ankle and foot: Secondary | ICD-10-CM

## 2024-02-10 DIAGNOSIS — M0579 Rheumatoid arthritis with rheumatoid factor of multiple sites without organ or systems involvement: Secondary | ICD-10-CM | POA: Diagnosis not present

## 2024-02-10 DIAGNOSIS — M80071D Age-related osteoporosis with current pathological fracture, right ankle and foot, subsequent encounter for fracture with routine healing: Secondary | ICD-10-CM | POA: Diagnosis not present

## 2024-02-10 DIAGNOSIS — I10 Essential (primary) hypertension: Secondary | ICD-10-CM | POA: Diagnosis not present

## 2024-02-10 DIAGNOSIS — I4891 Unspecified atrial fibrillation: Secondary | ICD-10-CM | POA: Diagnosis not present

## 2024-02-10 DIAGNOSIS — I080 Rheumatic disorders of both mitral and aortic valves: Secondary | ICD-10-CM

## 2024-02-10 DIAGNOSIS — E785 Hyperlipidemia, unspecified: Secondary | ICD-10-CM

## 2024-02-10 DIAGNOSIS — I739 Peripheral vascular disease, unspecified: Secondary | ICD-10-CM

## 2024-02-10 DIAGNOSIS — R32 Unspecified urinary incontinence: Secondary | ICD-10-CM

## 2024-02-10 MED ORDER — APIXABAN 2.5 MG PO TABS: 2.5000 mg | ORAL_TABLET | Freq: Two times a day (BID) | ORAL | 2 refills | Status: DC

## 2024-02-10 NOTE — Telephone Encounter (Signed)
 The patient's son [name] would like to know if it's ok to start the Eliquis with a spleen laceration? [name] said that he understands that the blood count is normal but that he just wants to make sure.  [name] was told that I would get the question to [place], [place].

## 2024-02-10 NOTE — Telephone Encounter (Signed)
-----   Message from Dayna N Dunn sent at 02/10/2024  1:47 PM EDT ----- I had talked with Dr. Francyne specifically about that and he said that as long as she was not having any abdominal pain, it has been sufficient time for healing and he recommends OK to start. Thanks  for checking. ----- Message ----- From: Merriam Maywood, CMA Sent: 02/10/2024   1:20 PM EDT To: [name] LOISE Bring, PA-C  The patient's son [name] would like to know if it's ok to start the Eliquis with a spleen laceration? [name] said that he understands that the blood count is normal but that he just wants to make sure. ----- Message ----- From: Bring [name] Sent: 02/10/2024  12:45 PM EDT To: [name] App Results  Patient's son [name] assists with care and he speaks [place]. Please reach out to him to relay that blood count looks stable. I discussed case with Dr. Francyne who agrees we should cautiously begin  Eliquis 2.5mg  twice a day but to notify for any recurrent bleeding in stool, or any new signs of bleeding like dark black stools, nosebleeds, blood in urine, or any increase in dizziness/falls. Thank  you. ----- Message ----- From: [name] Lab Results In Sent: 02/09/2024  10:35 PM EDT To: Dayna N Dunn, PA-C

## 2024-02-10 NOTE — Telephone Encounter (Signed)
-----   Message from Dayna N Dunn sent at 02/10/2024 12:45 PM EDT ----- Patient's son [name] assists with care and he speaks [place]. Please reach out to him to relay that blood count looks stable. I discussed case with Dr. Francyne who agrees we should cautiously begin  Eliquis 2.5mg  twice a day but to notify for any recurrent bleeding in stool, or any new signs of bleeding like dark black stools, nosebleeds, blood in urine, or any increase in dizziness/falls. Thank  you. ----- Message ----- From: [name] Lab Results In Sent: 02/09/2024  10:35 PM EDT To: Dayna N Dunn, PA-C

## 2024-02-10 NOTE — Telephone Encounter (Signed)
 The patient's son Christell was notified. Eliquis sent to the patient's pharmacy.

## 2024-02-11 ENCOUNTER — Telehealth: Payer: Self-pay

## 2024-02-11 ENCOUNTER — Other Ambulatory Visit: Payer: Self-pay | Admitting: Family Medicine

## 2024-02-11 DIAGNOSIS — N3281 Overactive bladder: Secondary | ICD-10-CM

## 2024-02-11 NOTE — Telephone Encounter (Signed)
 Prolia  VOB initiated via [email or web address]  Next Prolia  inj DUE: 03/03/24

## 2024-02-12 ENCOUNTER — Ambulatory Visit: Admitting: Family Medicine

## 2024-02-14 DIAGNOSIS — M810 Age-related osteoporosis without current pathological fracture: Secondary | ICD-10-CM | POA: Diagnosis not present

## 2024-02-14 DIAGNOSIS — S36039S Unspecified laceration of spleen, sequela: Secondary | ICD-10-CM | POA: Diagnosis not present

## 2024-02-14 DIAGNOSIS — S8254XA Nondisplaced fracture of medial malleolus of right tibia, initial encounter for closed fracture: Secondary | ICD-10-CM | POA: Diagnosis not present

## 2024-02-14 DIAGNOSIS — R55 Syncope and collapse: Secondary | ICD-10-CM | POA: Diagnosis not present

## 2024-02-15 ENCOUNTER — Other Ambulatory Visit (HOSPITAL_COMMUNITY): Payer: Self-pay

## 2024-02-15 NOTE — Telephone Encounter (Signed)
 Pt ready for scheduling for PROLIA  on or after : 03/03/24  Option# 1: Buy/Bill (Office supplied medication)  Out-of-pocket cost due at time of clinic visit: $0  Number of injection/visits approved: 2  Primary: UHC-MEDICARE Prolia  co-insurance: 0% Admin fee co-insurance: 0%  Secondary: MEDICAID Prolia  co-insurance:  Admin fee co-insurance:   Medical Benefit Details: Date Benefits were checked: 02/12/24 Deductible: $257 Met of $257 Required/ Coinsurance: 0%/ Admin Fee: 0%  Prior Auth: APPROVED PA# [id number] Expiration Date: 03/03/24-03/03/25  # of doses approved: 2 ----------------------------------------------------------------------- Option# 2- Med Obtained from pharmacy: Prolia  is no longer preferred for pharmacy benefit. Jubbonti is now preferred. PRICING IS FOR JUBBONTI  Pharmacy benefit: Copay $0 (Paid to pharmacy) Admin Fee: 0% (Pay at clinic)  Prior Auth: N/A PA# Expiration Date:   # of doses approved:   If patient wants fill through the pharmacy benefit please send prescription to: [facility], and include estimated need by date in rx notes. Pharmacy will ship medication directly to the office.  Patient NOT eligible for Prolia  Copay Card. Copay Card can make patient's cost as little as $25. Link to apply: https://www.amgensupportplus.com/copay  ** This summary of benefits is an estimation of the patient's out-of-pocket cost. Exact cost may very based on individual plan coverage.

## 2024-02-15 NOTE — Telephone Encounter (Signed)
 [name]

## 2024-02-17 NOTE — Telephone Encounter (Signed)
 Spoke to daughter regarding cost and scheduled appt for 03/04/2024

## 2024-02-26 ENCOUNTER — Other Ambulatory Visit: Payer: Self-pay | Admitting: Family Medicine

## 2024-02-29 ENCOUNTER — Telehealth: Payer: Self-pay | Admitting: Family Medicine

## 2024-02-29 NOTE — Telephone Encounter (Signed)
 Copied from CRM #[id number]. Topic: Clinical - Prescription Issue >> Feb 26, 2024  4:50 PM Nathanel BROCKS wrote: Reason for CRM: denosumab  (PROLIA ) injection 60 mg  [facility] called about this rx and they need it faxed over asap. Fax [phone] per Maeola KIDD

## 2024-03-01 NOTE — Telephone Encounter (Signed)
 Patient is doing bill and buy through the office this time - cost is $0 as she has met her deductible - called and let pharmacy and daughter know

## 2024-03-02 ENCOUNTER — Encounter: Payer: Self-pay | Admitting: Neurology

## 2024-03-04 ENCOUNTER — Ambulatory Visit

## 2024-03-04 ENCOUNTER — Ambulatory Visit (INDEPENDENT_AMBULATORY_CARE_PROVIDER_SITE_OTHER): Admitting: *Deleted

## 2024-03-04 DIAGNOSIS — M810 Age-related osteoporosis without current pathological fracture: Secondary | ICD-10-CM

## 2024-03-04 NOTE — Progress Notes (Signed)
 Patient is in office today for a nurse visit for Prolia  injection, subcutaneous. Injection was given in the  Right arm. Patient tolerated injection well.

## 2024-03-19 ENCOUNTER — Other Ambulatory Visit: Payer: Self-pay | Admitting: Family Medicine

## 2024-03-19 DIAGNOSIS — E785 Hyperlipidemia, unspecified: Secondary | ICD-10-CM

## 2024-03-21 ENCOUNTER — Telehealth: Payer: Self-pay | Admitting: Family Medicine

## 2024-03-21 NOTE — Telephone Encounter (Signed)
 Left message for [name] to return call. Advised him to go ahead and drop off paperwork and that [name] should be fine to complete without an appt since we see her so frequently.

## 2024-03-21 NOTE — Telephone Encounter (Signed)
 Copied from CRM [id number]. Topic: General - Other >> Mar 21, 2024 10:33 AM Zane F wrote: Reason for CRM:   Caller: [name] ( Patient's son)  Patient's son, is calling to speak with Nurse Rosina to inform her he will be dropping off documentation ( CAP Program- Request for Long Term Caregiver) for the provider to fill out. Patient's son was informed of the standard turnaround time for documentation (being 7-10 business days). Patient's son after being informed of submission process still requested to speak with Nurse Rosina . Patient's son, will drop off documentation today to the office. CAL call was answered and immediately placed on hold without introduction was on hold for 4+ minutes. Nurse unavailable at the time of call. Please call patient's son back to touch base about the documentation.   Callback Number: [phone]

## 2024-03-28 NOTE — Progress Notes (Unsigned)
   Cardiology Office Note    Date:  03/28/2024  ID:  [name], DOB [birth year], MRN [id number] PCP:  Dettinger, Fonda LABOR, MD  Cardiologist:  Jerel Balding, MD  Electrophysiologist:  None   Chief Complaint: ***  History of Present Illness: .    [name] is a 89 y.o. female with visit-pertinent history of HTN, heart murmur, arthritis, UTI, LVH, atrial fibrillation, mild-moderate aortic stenosis, mild mitral regurgitation, coronary/aortic atherosclerosis, cerebrovascular disease, incidental thyroid  nodule on imaging seen for follow-up. [name] remotely saw Dr. Lavona in 2016 for dyspnea and palpitations. Monitor was not completed. 2D echo showed EF 60-65% with moderate LVH. She was admitted 12/2023 with fall striking her head, unclear if syncope (patient did not think she did, family could not be sure). Presenting BP 109/65. Trauma workup showed splenic laceration and right medial malleolus fracture. She was also noted to be in newly recognized rate controlled AF. 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 Echo showed EF 55-60%, mildly reduced RV function, mild MR, findings most c/w mild-moderate AS, suspected bicuspid AV (previously reported tricuspid in 2016). She has no acute complaints today. Anticoagulation was initially deferred due to splenic lac. Monitor showed 100% AF, rare PVCs, no bradycardia, avg 81bpm, range 45-158bpm but mostly good ventricular rate control. Eliquis  was discussed in follow-up with Dr. Balding who recommended initiation given duration since discharge.  Brbpr Referred to neurology  Persistent atrial fibrillation  Mild-moderate AS Fall, history of dizziness, cerebrovascular disease  Labwork independently reviewed: 01/2024 Hgb 12.3, plt 278 01/2024 BMET wnl, TSH 5.060 managed by  PCP 12/2023 Mg OK, TSH 4.9, AST ALT ok, Hgb 9-11's 01/2023 LDL 91, trig 107 (PCP)  ROS: .    Please see the history of present illness. Otherwise, review of systems is positive for ***.  All other systems are reviewed and otherwise negative.  Studies Reviewed: [name]    EKG:  EKG is ordered today, personally reviewed, demonstrating ***  CV Studies: Cardiac studies reviewed are outlined and summarized above. Otherwise please see EMR for full report.   Current Reported Medications:.    No outpatient medications have been marked as taking for the 03/30/24 encounter (Appointment) with Arron Tetrault N, PA-C.   Current Facility-Administered Medications for the 03/30/24 encounter (Appointment) with Callaghan Laverdure N, PA-C  Medication   denosumab  (PROLIA ) injection 60 mg    Physical Exam:    VS:  There were no vitals taken for this visit.   Wt Readings from Last 3 Encounters:  02/09/24 118 lb (53.5 kg)  01/26/24 118 lb (53.5 kg)  01/10/24 118 lb 13.3 oz (53.9 kg)    GEN: Well nourished, well developed in no acute distress NECK: No JVD; No carotid bruits CARDIAC: ***RRR, no murmurs, rubs, gallops RESPIRATORY:  Clear to auscultation without rales, wheezing or rhonchi  ABDOMEN: Soft, non-tender, non-distended EXTREMITIES:  No edema; No acute deformity   Asessement and Plan:.     ***     Disposition: F/u with ***  Signed, Mabeline Varas N Reyansh Kushnir, PA-C

## 2024-03-30 ENCOUNTER — Ambulatory Visit: Attending: Physician Assistant | Admitting: Physician Assistant

## 2024-03-30 ENCOUNTER — Encounter: Payer: Self-pay | Admitting: Physician Assistant

## 2024-03-30 VITALS — BP 140/80 | HR 64 | Ht <= 58 in | Wt 112.0 lb

## 2024-03-30 DIAGNOSIS — W19XXXD Unspecified fall, subsequent encounter: Secondary | ICD-10-CM | POA: Diagnosis not present

## 2024-03-30 DIAGNOSIS — R42 Dizziness and giddiness: Secondary | ICD-10-CM | POA: Diagnosis not present

## 2024-03-30 DIAGNOSIS — I4819 Other persistent atrial fibrillation: Secondary | ICD-10-CM | POA: Diagnosis not present

## 2024-03-30 DIAGNOSIS — R0789 Other chest pain: Secondary | ICD-10-CM | POA: Diagnosis not present

## 2024-03-30 DIAGNOSIS — I350 Nonrheumatic aortic (valve) stenosis: Secondary | ICD-10-CM | POA: Diagnosis not present

## 2024-03-30 DIAGNOSIS — I679 Cerebrovascular disease, unspecified: Secondary | ICD-10-CM

## 2024-03-30 LAB — HEMOGLOBIN AND HEMATOCRIT, BLOOD
Hematocrit: 38.8 % (ref 34.0–46.6)
Hemoglobin: 12.6 g/dL (ref 11.1–15.9)

## 2024-03-30 MED ORDER — APIXABAN 2.5 MG PO TABS: 2.5000 mg | ORAL_TABLET | Freq: Two times a day (BID) | ORAL | 3 refills | Status: AC

## 2024-03-30 NOTE — Patient Instructions (Signed)
 Medication Instructions:  NO CHANGES.  Lab Work: HEMOGLOBIN AND HEMATOCRIT TO BE DONE TODAY.  Testing/Procedures: NONE   Follow-Up: At [facility], you and your health needs are our priority.  As part of our continuing mission to provide you with exceptional heart care, our providers are all part of one team.  This team includes your primary Cardiologist (physician) and Advanced Practice Providers or APPs (Physician Assistants and Nurse Practitioners) who all work together to provide you with the care you need, when you need it.  Your next appointment:   6 MONTHS  Provider:   Jerel Balding, MD

## 2024-03-31 ENCOUNTER — Ambulatory Visit: Payer: Self-pay | Admitting: Physician Assistant

## 2024-04-08 ENCOUNTER — Ambulatory Visit: Payer: Self-pay | Admitting: Family Medicine

## 2024-04-11 ENCOUNTER — Encounter: Payer: Self-pay | Admitting: Family Medicine

## 2024-04-21 ENCOUNTER — Other Ambulatory Visit: Payer: Self-pay | Admitting: Nurse Practitioner

## 2024-04-21 DIAGNOSIS — I10 Essential (primary) hypertension: Secondary | ICD-10-CM

## 2024-04-22 ENCOUNTER — Telehealth: Payer: Self-pay | Admitting: Family Medicine

## 2024-04-22 NOTE — Telephone Encounter (Signed)
 Copied from CRM [id number]. Topic: General - Other >> Apr 22, 2024  4:41 PM Travis F wrote: Reason for CRM: Patient's son [name], is calling in because they have been trying to get patient an aide to help her at home, and they dropped off some paperwork for patient's provider to fill out. [name], patient's son says that [facility] in [place], [place] has not received anything back from the office and he would like to speak with the provider's nurse regarding this matter. Please follow up with [name].

## 2024-04-25 ENCOUNTER — Encounter: Payer: Self-pay | Admitting: "Endocrinology

## 2024-04-25 ENCOUNTER — Ambulatory Visit (INDEPENDENT_AMBULATORY_CARE_PROVIDER_SITE_OTHER): Admitting: "Endocrinology

## 2024-04-25 VITALS — BP 104/78 | HR 60 | Ht <= 58 in | Wt 113.0 lb

## 2024-04-25 DIAGNOSIS — E038 Other specified hypothyroidism: Secondary | ICD-10-CM

## 2024-04-25 NOTE — Telephone Encounter (Signed)
 Aware form faxed to the CAPS program

## 2024-04-25 NOTE — Progress Notes (Signed)
 "          Endocrinology Consult Note                                            04/25/2024, 2:33 PM   Subjective:    Patient ID: [name], female    DOB: 1932/04/27, PCP [name], [name] LABOR, MD   Past Medical History:  Diagnosis Date   Arthritis of right foot    Hx: UTI (urinary tract infection)    Hypertension    Osteoporosis    Palpitations    Unsteady gait    Past Surgical History:  Procedure Laterality Date   CATARACT EXTRACTION W/PHACO Right 01/01/2016   Procedure: CATARACT EXTRACTION PHACO AND INTRAOCULAR LENS PLACEMENT RIGHT EYE;  Surgeon: [name] Platts, MD;  Location: [facility];  Service: Ophthalmology;  Laterality: Right;  CDE: 9.69   CATARACT EXTRACTION W/PHACO Left 01/29/2016   Procedure: CATARACT EXTRACTION PHACO AND INTRAOCULAR LENS PLACEMENT (IOC);  Surgeon: [name] Platts, MD;  Location: [facility];  Service: Ophthalmology;  Laterality: Left;  CDE: 9.32   None     Social History   Socioeconomic History   Marital status: Married    Spouse name: [name]   Number of children: 4   Years of education: Not on file   Highest education level: 3rd grade  Occupational History   Occupation: Homemaker  Tobacco Use   Smoking status: Never   Smokeless tobacco: Never  Vaping Use   Vaping status: Never Used  Substance and Sexual Activity   Alcohol use: No    Alcohol/week: 0.0 standard drinks of alcohol   Drug use: Never   Sexual activity: Not on file  Other Topics Concern   Not on file  Social History Narrative   Lives at home with son  and sister in law and husband.     1 cup coffee per day.   Right-handed.   Social Drivers of Health   Tobacco Use: Low Risk (04/25/2024)   Patient History    Smoking Tobacco Use: Never    Smokeless Tobacco Use: Never    Passive Exposure: Not on file  Financial Resource Strain: Low Risk (06/12/2023)   Overall Financial Resource Strain (CARDIA)    Difficulty of Paying Living Expenses: Not hard at all  Food Insecurity: No Food  Insecurity (01/11/2024)   Epic    Worried About Radiation Protection Practitioner of Food in the Last Year: Never true    Ran Out of Food in the Last Year: Never true  Transportation Needs: No Transportation Needs (01/11/2024)   Epic    Lack of Transportation (Medical): No    Lack of Transportation (Non-Medical): No  Physical Activity: Insufficiently Active (06/12/2023)   Exercise Vital Sign    Days of Exercise per Week: 3 days    Minutes of Exercise per Session: 30 min  Stress: No Stress Concern Present (06/12/2023)   [employer] of Occupational Health - Occupational Stress Questionnaire    Feeling of Stress : Not at all  Social Connections: Moderately Integrated (01/11/2024)   Social Connection and Isolation Panel    Frequency of Communication with Friends and Family: More than three times a week    Frequency of Social Gatherings with Friends and Family: Three times a week    Attends Religious Services: 1 to 4 times per year    Active Member of Clubs or Organizations:  No    Attends Club or Organization Meetings: Never    Marital Status: Married  Depression (PHQ2-9): Low Risk (12/31/2023)   Depression (PHQ2-9)    PHQ-2 Score: 0  Alcohol Screen: Low Risk (06/12/2023)   Alcohol Screen    Last Alcohol Screening Score (AUDIT): 0  Housing: Low Risk (01/11/2024)   Epic    Unable to Pay for Housing in the Last Year: No    Number of Times Moved in the Last Year: 0    Homeless in the Last Year: No  Utilities: Not At Risk (01/11/2024)   Epic    Threatened with loss of utilities: No  Health Literacy: Adequate Health Literacy (06/12/2023)   B1300 Health Literacy    Frequency of need for help with medical instructions: Never   Family History  Problem Relation Age of Onset   Heart attack Mother 52       Died in [place]   Healthy Daughter    Healthy Daughter    Healthy Son    Healthy Son    Allergic rhinitis Neg Hx    Angioedema Neg Hx    Asthma Neg Hx    Eczema Neg Hx    Immunodeficiency Neg Hx     Urticaria Neg Hx    Stroke Neg Hx    Outpatient Encounter Medications as of 04/25/2024  Medication Sig   Cholecalciferol (VITAMIN D -3 PO) Take 25 mcg by mouth daily.   apixaban  (ELIQUIS ) 2.5 MG TABS tablet Take 1 tablet (2.5 mg total) by mouth 2 (two) times daily.   atorvastatin  (LIPITOR) 80 MG tablet TAKE 1 TABLET BY MOUTH EVERY DAY   ferrous sulfate  325 (65 FE) MG tablet Take 1 tablet (325 mg total) by mouth daily with breakfast.   HYDROcodone -acetaminophen  (NORCO/VICODIN) 5-325 MG tablet Take 1-2 tablets by mouth every 6 (six) hours as needed for moderate pain (pain score 4-6) or severe pain (pain score 7-10).   loratadine  (CLARITIN ) 10 MG tablet Take 1 tablet (10 mg total) by mouth daily. (Patient taking differently: Take 10 mg by mouth as needed. Seasonal allergies)   mirtazapine  (REMERON ) 7.5 MG tablet Take 1 tablet (7.5 mg total) by mouth at bedtime.   MYRBETRIQ  25 MG TB24 tablet TAKE 1 TABLET (25 MG TOTAL) BY MOUTH DAILY.   Facility-Administered Encounter Medications as of 04/25/2024  Medication   denosumab  (PROLIA ) injection 60 mg   ALLERGIES: Allergies[1]  VACCINATION STATUS: Immunization History  Administered Date(s) Administered   Fluad Quad(high Dose 65+) 01/23/2020, 01/24/2021, 02/13/2022   Fluad Trivalent(High Dose 65+) 01/23/2023   INFLUENZA, HIGH DOSE SEASONAL PF 02/05/2018, 12/31/2023   Moderna Sars-Covid-2 Vaccination 06/27/2019, 07/25/2019   PFIZER(Purple Top)SARS-COV-2 Vaccination 04/25/2020   PNEUMOCOCCAL CONJUGATE-20 08/14/2022   Pneumococcal Conjugate-13 07/25/2020   Tdap 01/16/2017    HPI [name] is 89 y.o. female who presents today with a medical history as above. she is being seen in consultation for abnormal thyroid  function tests requested by [name], [name] LABOR, MD.  She is accompanied by her son [name] who is also her interpreter for Spanish language. In September and October 2025, she was found to have slightly above target TSH.  She denies any  prior history of thyroid  dysfunction.  She is not on thyroid  hormone at the moment. She gives remote past history of thyroid  nodule. She is wheelchair-bound recently due to disequilibrium and falls.  She lost her appetite, and progressively losing weight. She is not active at baseline.  Her other medical problems include atrial fibrillation,  hyperlipidemia, diffuse pain.   Review of Systems  Constitutional: + Progressive weight loss, + fatigue, no subjective hyperthermia, no subjective hypothermia Eyes: no blurry vision, no xerophthalmia ENT: no sore throat, no nodules palpated in throat, no dysphagia/odynophagia, no hoarseness Cardiovascular: no Chest Pain, no Shortness of Breath, no palpitations, no leg swelling Respiratory: no cough, no shortness of breath Gastrointestinal: no Nausea/Vomiting/Diarhhea Musculoskeletal: + Wheelchair-bound, diffuse arthralgias.   Skin: no rashes Neurological: no tremors, no numbness, no tingling, no dizziness Psychiatric: no depression, no anxiety  Objective:       04/25/2024    1:21 PM 03/30/2024   10:48 AM 03/30/2024   10:13 AM  Vitals with BMI  Height 4' 9  4' 9  Weight 113 lbs  112 lbs  BMI 24.45  24.23  Systolic 104 140 98  Diastolic 78 80 70  Pulse 60  64    BP 104/78   Pulse 60   Ht 4' 9 (1.448 m)   Wt 113 lb (51.3 kg)   BMI 24.45 kg/m   Wt Readings from Last 3 Encounters:  04/25/24 113 lb (51.3 kg)  03/30/24 112 lb (50.8 kg)  02/09/24 118 lb (53.5 kg)    Physical Exam  Constitutional:  Body mass index is 24.45 kg/m.,  not in acute distress, normal state of mind, + wheelchair-bound. Eyes: PERRLA, EOMI, no exophthalmos ENT: moist mucous membranes, no gross thyromegaly, no gross cervical lymphadenopathy Cardiovascular: normal precordial activity, Regular Rate and Rhythm, no Murmur/Rubs/Gallops Respiratory:  adequate breathing efforts, no gross chest deformity, Clear to auscultation bilaterally Gastrointestinal: abdomen  soft, Non -tender, No distension, Bowel Sounds present, no gross organomegaly Musculoskeletal: no gross deformities, strength intact in all four extremities, no peripheral edema Skin: moist, warm, no rashes Neurological: no tremor with outstretched hands, Deep tendon reflexes normal in bilateral lower extremities.  CMP ( most recent) CMP     Component Value Date/Time   NA 137 01/26/2024 1147   K 4.7 01/26/2024 1147   CL 101 01/26/2024 1147   CO2 22 01/26/2024 1147   GLUCOSE 94 01/26/2024 1147   GLUCOSE 92 01/14/2024 0539   BUN 10 01/26/2024 1147   CREATININE 0.81 01/26/2024 1147   CREATININE 0.86 11/13/2021 1115   CALCIUM  9.0 01/26/2024 1147   PROT 5.7 (L) 01/11/2024 0654   PROT 6.6 01/23/2023 1138   ALBUMIN 2.6 (L) 01/11/2024 0654   ALBUMIN 3.8 01/23/2023 1138   AST 18 01/11/2024 0654   ALT 13 01/11/2024 0654   ALKPHOS 66 01/11/2024 0654   BILITOT 0.9 01/11/2024 0654   BILITOT 0.5 01/23/2023 1138   EGFR 70 01/26/2024 1147   GFRNONAA >60 01/14/2024 0539   GFRNONAA 73 04/26/2018 1005     Lipid Panel ( most recent) Lipid Panel     Component Value Date/Time   CHOL 151 01/23/2023 1138   TRIG 107 01/23/2023 1138   HDL 40 01/23/2023 1138   CHOLHDL 3.8 01/23/2023 1138   LDLCALC 91 01/23/2023 1138   LABVLDL 20 01/23/2023 1138      Lab Results  Component Value Date   TSH 5.060 (H) 01/26/2024   TSH 4.917 (H) 01/12/2024   TSH 3.550 12/27/2014   FREET4 0.94 02/22/2015      Assessment & Plan:   1. Subclinical hypothyroidism (Primary)  - [name]  is being seen at a kind request of [name], [name] LABOR, MD. - I have reviewed her available  records and clinically evaluated the patient. - Based on these reviews, she has subclinical  hypothyroidism,  however,  there is not sufficient information to proceed with definitive treatment plan.  - she will need a repeat,  more complete thyroid  function tests towards confirming the diagnosis.  She will be sent to lab for  TSH, free T4, total T3 and antithyroid antibodies. This patient seems to have mild to moderate protein calorie malnutrition.  I have encouraged her son to arrange one-to-one assistance at mealtimes for her to assure adequate caloric intake. Fall precautions discussed with the family.  She is high risk for disequilibrium/falls. -Remote past thyroid  ultrasound showed 1 cm thyroid  nodule, we will decide if she needs any further imaging after her lab work. - I did not initiate any new prescriptions today. - she is advised to maintain close follow up with [name], [name] LABOR, MD for primary care needs.   -Thank you for involving me in the care of this pleasant patient.  Time spent with the patient: 45  minutes spent in  counseling her about subclinical hypothyroidism and the rest in obtaining information about her symptoms, reviewing her previous labs/studies (including abstractions from other facilities),  evaluations, and treatments,  and developing a plan to confirm diagnosis and long term treatment based on the latest standards of care/guidelines; and documenting her care.  [name] participated in the discussions, expressed understanding, and voiced agreement with the above plans.  All questions were answered to her satisfaction. she is encouraged to contact clinic should she have any questions or concerns prior to her return visit.  Follow up plan: Return in about 1 week (around 05/02/2024) for Labs Today- Non-Fasting Ok.   Ranny Earl, MD [facility] [address] [place], [place] [address] Phone: [phone]  Fax: [phone]     04/25/2024, 2:33 PM  This note was partially dictated with voice recognition software. Similar sounding words can be transcribed inadequately or may not  be corrected upon review.      [1]  Allergies Allergen Reactions   Penicillin G Hives   Lisinopril  Swelling    Tongue swelling   "

## 2024-04-25 NOTE — Telephone Encounter (Signed)
 Let the know that it got accidentally put under a pile of some other stuff in my desk and will completed today and get it faxed today.

## 2024-04-25 NOTE — Telephone Encounter (Signed)
 [name]- son of patient came by about ppw from [facility], [place] not being received. I read what Dr. Maryanne put in the chart about form being misplaced and would be done today and faxed.

## 2024-04-26 LAB — TSH: TSH: 2.3 u[IU]/mL (ref 0.450–4.500)

## 2024-04-26 LAB — T4, FREE: Free T4: 1.12 ng/dL (ref 0.82–1.77)

## 2024-04-26 LAB — THYROGLOBULIN ANTIBODY: Thyroglobulin Antibody: <1 [IU]/mL (ref 0.0–0.9)

## 2024-04-26 LAB — THYROID PEROXIDASE ANTIBODY: Thyroperoxidase Ab SerPl-aCnc: 12 [IU]/mL (ref 0–34)

## 2024-04-26 LAB — T3: T3, Total: 90 ng/dL (ref 71–180)

## 2024-05-04 ENCOUNTER — Encounter: Payer: Self-pay | Admitting: "Endocrinology

## 2024-05-04 ENCOUNTER — Ambulatory Visit (INDEPENDENT_AMBULATORY_CARE_PROVIDER_SITE_OTHER): Admitting: "Endocrinology

## 2024-05-04 VITALS — BP 102/72 | HR 62 | Ht <= 58 in | Wt 113.0 lb

## 2024-05-04 DIAGNOSIS — E038 Other specified hypothyroidism: Secondary | ICD-10-CM

## 2024-05-04 NOTE — Progress Notes (Signed)
 "          Endocrinology follow-up note                                             05/04/2024, 11:02 AM   Subjective:    Patient ID: [name], female    DOB: [birth year], PCP Dettinger, Fonda LABOR, MD   Past Medical History:  Diagnosis Date   Arthritis of right foot    Hx: UTI (urinary tract infection)    Hypertension    Osteoporosis    Palpitations    Unsteady gait    Past Surgical History:  Procedure Laterality Date   CATARACT EXTRACTION W/PHACO Right 01/01/2016   Procedure: CATARACT EXTRACTION PHACO AND INTRAOCULAR LENS PLACEMENT RIGHT EYE;  Surgeon: Oneil Platts, MD;  Location: [facility];  Service: Ophthalmology;  Laterality: Right;  CDE: 9.69   CATARACT EXTRACTION W/PHACO Left 01/29/2016   Procedure: CATARACT EXTRACTION PHACO AND INTRAOCULAR LENS PLACEMENT (IOC);  Surgeon: Oneil Platts, MD;  Location: [facility];  Service: Ophthalmology;  Laterality: Left;  CDE: 9.32   None     Social History   Socioeconomic History   Marital status: Married    Spouse name: [name]   Number of children: 4   Years of education: Not on file   Highest education level: 3rd grade  Occupational History   Occupation: Homemaker  Tobacco Use   Smoking status: Never   Smokeless tobacco: Never  Vaping Use   Vaping status: Never Used  Substance and Sexual Activity   Alcohol use: No    Alcohol/week: 0.0 standard drinks of alcohol   Drug use: Never   Sexual activity: Not on file  Other Topics Concern   Not on file  Social History Narrative   Lives at home with son  and sister in law and husband.     1 cup coffee per day.   Right-handed.   Social Drivers of Health   Tobacco Use: Low Risk (05/04/2024)   Patient History    Smoking Tobacco Use: Never    Smokeless Tobacco Use: Never    Passive Exposure: Not on file  Financial Resource Strain: Low Risk (06/12/2023)   Overall Financial Resource Strain (CARDIA)    Difficulty of Paying Living Expenses: Not hard at all  Food Insecurity: No  Food Insecurity (01/11/2024)   Epic    Worried About Radiation Protection Practitioner of Food in the Last Year: Never true    Ran Out of Food in the Last Year: Never true  Transportation Needs: No Transportation Needs (01/11/2024)   Epic    Lack of Transportation (Medical): No    Lack of Transportation (Non-Medical): No  Physical Activity: Insufficiently Active (06/12/2023)   Exercise Vital Sign    Days of Exercise per Week: 3 days    Minutes of Exercise per Session: 30 min  Stress: No Stress Concern Present (06/12/2023)   [employer] of Occupational Health - Occupational Stress Questionnaire    Feeling of Stress : Not at all  Social Connections: Moderately Integrated (01/11/2024)   Social Connection and Isolation Panel    Frequency of Communication with Friends and Family: More than three times a week    Frequency of Social Gatherings with Friends and Family: Three times a week    Attends Religious Services: 1 to 4 times per year    Active Member of Clubs or  Organizations: No    Attends Banker Meetings: Never    Marital Status: Married  Depression (PHQ2-9): Low Risk (12/31/2023)   Depression (PHQ2-9)    PHQ-2 Score: 0  Alcohol Screen: Low Risk (06/12/2023)   Alcohol Screen    Last Alcohol Screening Score (AUDIT): 0  Housing: Low Risk (01/11/2024)   Epic    Unable to Pay for Housing in the Last Year: No    Number of Times Moved in the Last Year: 0    Homeless in the Last Year: No  Utilities: Not At Risk (01/11/2024)   Epic    Threatened with loss of utilities: No  Health Literacy: Adequate Health Literacy (06/12/2023)   B1300 Health Literacy    Frequency of need for help with medical instructions: Never   Family History  Problem Relation Age of Onset   Heart attack Mother 73       Died in [place]   Healthy Daughter    Healthy Daughter    Healthy Son    Healthy Son    Allergic rhinitis Neg Hx    Angioedema Neg Hx    Asthma Neg Hx    Eczema Neg Hx    Immunodeficiency Neg Hx     Urticaria Neg Hx    Stroke Neg Hx    Outpatient Encounter Medications as of 05/04/2024  Medication Sig   apixaban  (ELIQUIS ) 2.5 MG TABS tablet Take 1 tablet (2.5 mg total) by mouth 2 (two) times daily.   atorvastatin  (LIPITOR) 80 MG tablet TAKE 1 TABLET BY MOUTH EVERY DAY   Cholecalciferol (VITAMIN D -3 PO) Take 25 mcg by mouth daily.   ferrous sulfate  325 (65 FE) MG tablet Take 1 tablet (325 mg total) by mouth daily with breakfast.   HYDROcodone -acetaminophen  (NORCO/VICODIN) 5-325 MG tablet Take 1-2 tablets by mouth every 6 (six) hours as needed for moderate pain (pain score 4-6) or severe pain (pain score 7-10).   loratadine  (CLARITIN ) 10 MG tablet Take 1 tablet (10 mg total) by mouth daily.   mirtazapine  (REMERON ) 7.5 MG tablet Take 1 tablet (7.5 mg total) by mouth at bedtime.   MYRBETRIQ  25 MG TB24 tablet TAKE 1 TABLET (25 MG TOTAL) BY MOUTH DAILY.   Facility-Administered Encounter Medications as of 05/04/2024  Medication   denosumab  (PROLIA ) injection 60 mg   ALLERGIES: Allergies[1]  VACCINATION STATUS: Immunization History  Administered Date(s) Administered   Fluad Quad(high Dose 65+) 01/23/2020, 01/24/2021, 02/13/2022   Fluad Trivalent(High Dose 65+) 01/23/2023   INFLUENZA, HIGH DOSE SEASONAL PF 02/05/2018, 12/31/2023   Moderna Sars-Covid-2 Vaccination 06/27/2019, 07/25/2019   PFIZER(Purple Top)SARS-COV-2 Vaccination 04/25/2020   PNEUMOCOCCAL CONJUGATE-20 08/14/2022   Pneumococcal Conjugate-13 07/25/2020   Tdap 01/16/2017    HPI [name] is 89 y.o. female who presents today with a medical history as above. she is being seen in follow-up after she was seen in consultation for abnormal thyroid  function tests requested by Dettinger, Fonda LABOR, MD.  She is accompanied by her son [name] who is also her interpreter for Spanish language. In September and October 2025, she was found to have slightly above target TSH.  She denies any prior history of thyroid  dysfunction.  She is  not on thyroid  hormone at the moment. Her previsit.  Thyroid  function test are consistent with euthyroid state. She gives remote past history of thyroid  nodule. She is wheelchair-bound recently due to disequilibrium and falls.  She lost her appetite, and progressively losing weight. She is not active at baseline.  Her  other medical problems include atrial fibrillation, hyperlipidemia, diffuse pain.   Review of Systems  Constitutional: + Progressive weight loss, + fatigue, no subjective hyperthermia, no subjective hypothermia  Gastrointestinal: no Nausea/Vomiting/Diarhhea Musculoskeletal: + Wheelchair-bound, diffuse arthralgias.   Skin: no rashes   Objective:       05/04/2024   10:35 AM 04/25/2024    1:21 PM 03/30/2024   10:48 AM  Vitals with BMI  Height 4' 9 4' 9   Weight 113 lbs 113 lbs   BMI 24.45 24.45   Systolic 102 104 859  Diastolic 72 78 80  Pulse 62 60     BP 102/72 (BP Location: Left Arm, Patient Position: Sitting, Cuff Size: Large)   Pulse 62   Ht 4' 9 (1.448 m)   Wt 113 lb (51.3 kg) Comment: weighed 04-25-24  BMI 24.45 kg/m   Wt Readings from Last 3 Encounters:  05/04/24 113 lb (51.3 kg)  04/25/24 113 lb (51.3 kg)  03/30/24 112 lb (50.8 kg)    Physical Exam  Constitutional:  Body mass index is 24.45 kg/m.,  not in acute distress, normal state of mind, + wheelchair-bound. No gross thyromegaly  CMP ( most recent) CMP     Component Value Date/Time   NA 137 01/26/2024 1147   K 4.7 01/26/2024 1147   CL 101 01/26/2024 1147   CO2 22 01/26/2024 1147   GLUCOSE 94 01/26/2024 1147   GLUCOSE 92 01/14/2024 0539   BUN 10 01/26/2024 1147   CREATININE 0.81 01/26/2024 1147   CREATININE 0.86 11/13/2021 1115   CALCIUM  9.0 01/26/2024 1147   PROT 5.7 (L) 01/11/2024 0654   PROT 6.6 01/23/2023 1138   ALBUMIN 2.6 (L) 01/11/2024 0654   ALBUMIN 3.8 01/23/2023 1138   AST 18 01/11/2024 0654   ALT 13 01/11/2024 0654   ALKPHOS 66 01/11/2024 0654   BILITOT 0.9  01/11/2024 0654   BILITOT 0.5 01/23/2023 1138   EGFR 70 01/26/2024 1147   GFRNONAA >60 01/14/2024 0539   GFRNONAA 73 04/26/2018 1005     Lipid Panel ( most recent) Lipid Panel     Component Value Date/Time   CHOL 151 01/23/2023 1138   TRIG 107 01/23/2023 1138   HDL 40 01/23/2023 1138   CHOLHDL 3.8 01/23/2023 1138   LDLCALC 91 01/23/2023 1138   LABVLDL 20 01/23/2023 1138      Lab Results  Component Value Date   TSH 2.300 04/25/2024   TSH 5.060 (H) 01/26/2024   TSH 4.917 (H) 01/12/2024   TSH 3.550 12/27/2014   FREET4 1.12 04/25/2024   FREET4 0.94 02/22/2015      Assessment & Plan:   1. Subclinical hypothyroidism - resolved   - I have reviewed her  new and available  records and clinically evaluated the patient. - Based on these reviews, she has euthyroid presentation.  Her previous subclinical hypothyroidism has resolved.  She will not need any intervention with thyroid  hormone or antithyroid medications at this time.    This patient seems to have mild to moderate protein calorie malnutrition.  I have encouraged her son to arrange one-to-one assistance at mealtimes for her to assure adequate caloric intake. Fall precautions discussed with the family.  She is high risk for disequilibrium/falls. -Remote past thyroid  ultrasound showed 1 cm thyroid  nodule, which will not need any further imaging at this time.  - she is advised to maintain close follow up with Dettinger, Fonda LABOR, MD for primary care needs.   I spent  20  minutes in the care of the patient today including review of labs from Thyroid  Function, CMP, and other relevant labs ; imaging/biopsy records (current and previous including abstractions from other facilities); face-to-face time discussing  her lab results and symptoms, medications doses, her options of short and long term treatment based on the latest standards of care / guidelines;   and documenting the encounter.  Lawrie Tunks  participated in the  discussions, expressed understanding, and voiced agreement with the above plans.  All questions were answered to her satisfaction. she is encouraged to contact clinic should she have any questions or concerns prior to her return visit. Dear Patient: Feel free to review your progress notes.  If you are reviewing this progress note and have questions about the meaning of /or medical terms being used, please make a note and address it at your next follow-up appointment.  Medical notes are meant to be a communication tool between medical professionals and require medical terms to be used for efficiency and insurance approval.   Follow up plan: Return if symptoms worsen or fail to improve.   Ranny Earl, MD [facility] [address] [place], [place] [address] Phone: [phone]  Fax: [phone]     05/04/2024, 11:02 AM  This note was partially dictated with voice recognition software. Similar sounding words can be transcribed inadequately or may not  be corrected upon review.       [1]  Allergies Allergen Reactions   Penicillin G Hives   Lisinopril  Swelling    Tongue swelling   "

## 2024-05-27 ENCOUNTER — Other Ambulatory Visit: Payer: Self-pay | Admitting: Family Medicine

## 2024-05-27 DIAGNOSIS — E785 Hyperlipidemia, unspecified: Secondary | ICD-10-CM

## 2024-06-14 ENCOUNTER — Ambulatory Visit: Payer: Self-pay

## 2024-06-22 ENCOUNTER — Ambulatory Visit: Admitting: Neurology
